# Patient Record
Sex: Male | Born: 1960 | Race: White | Hispanic: No | Marital: Married | Smoking: Former smoker
Health system: Southern US, Community
[De-identification: ages and names within clinical notes are randomized; demographics above are authoritative.]

## PROBLEM LIST (undated history)

## (undated) DIAGNOSIS — Z87442 Personal history of urinary calculi: Secondary | ICD-10-CM

## (undated) DIAGNOSIS — D649 Anemia, unspecified: Secondary | ICD-10-CM

## (undated) DIAGNOSIS — G709 Myoneural disorder, unspecified: Secondary | ICD-10-CM

## (undated) DIAGNOSIS — M1990 Unspecified osteoarthritis, unspecified site: Secondary | ICD-10-CM

## (undated) DIAGNOSIS — G629 Polyneuropathy, unspecified: Secondary | ICD-10-CM

## (undated) DIAGNOSIS — I10 Essential (primary) hypertension: Secondary | ICD-10-CM

## (undated) DIAGNOSIS — E119 Type 2 diabetes mellitus without complications: Secondary | ICD-10-CM

## (undated) DIAGNOSIS — E785 Hyperlipidemia, unspecified: Secondary | ICD-10-CM

## (undated) HISTORY — DX: Type 2 diabetes mellitus without complications: E11.9

## (undated) HISTORY — PX: TOE AMPUTATION: SHX809

## (undated) HISTORY — DX: Hyperlipidemia, unspecified: E78.5

## (undated) HISTORY — DX: Essential (primary) hypertension: I10

## (undated) HISTORY — PX: SHOULDER OPEN ROTATOR CUFF REPAIR: SHX2407

---

## 2009-02-12 LAB — HM COLONOSCOPY

## 2014-04-23 ENCOUNTER — Inpatient Hospital Stay: Payer: Self-pay | Admitting: Specialist

## 2014-04-28 ENCOUNTER — Ambulatory Visit: Payer: Self-pay | Admitting: Vascular Surgery

## 2014-06-12 ENCOUNTER — Ambulatory Visit (INDEPENDENT_AMBULATORY_CARE_PROVIDER_SITE_OTHER): Payer: Medicare Other | Admitting: Nurse Practitioner

## 2014-06-12 ENCOUNTER — Encounter (INDEPENDENT_AMBULATORY_CARE_PROVIDER_SITE_OTHER): Payer: Self-pay

## 2014-06-12 ENCOUNTER — Encounter: Payer: Self-pay | Admitting: Nurse Practitioner

## 2014-06-12 VITALS — BP 132/70 | HR 66 | Temp 98.8°F | Resp 16 | Ht 76.0 in | Wt 304.1 lb

## 2014-06-12 DIAGNOSIS — M5441 Lumbago with sciatica, right side: Secondary | ICD-10-CM

## 2014-06-12 DIAGNOSIS — E8881 Metabolic syndrome: Secondary | ICD-10-CM | POA: Insufficient documentation

## 2014-06-12 DIAGNOSIS — Z7189 Other specified counseling: Secondary | ICD-10-CM | POA: Diagnosis not present

## 2014-06-12 DIAGNOSIS — Z86718 Personal history of other venous thrombosis and embolism: Secondary | ICD-10-CM | POA: Insufficient documentation

## 2014-06-12 DIAGNOSIS — Z7689 Persons encountering health services in other specified circumstances: Secondary | ICD-10-CM

## 2014-06-12 DIAGNOSIS — M549 Dorsalgia, unspecified: Secondary | ICD-10-CM | POA: Insufficient documentation

## 2014-06-12 MED ORDER — CYCLOBENZAPRINE HCL 10 MG PO TABS: 10.0000 mg | ORAL_TABLET | Freq: Three times a day (TID) | ORAL | Status: DC | PRN

## 2014-06-12 NOTE — Assessment & Plan Note (Signed)
Discussed acute and chronic issues. Reviewed health maintenance measures, PFSHx, and immunizations.   

## 2014-06-12 NOTE — Progress Notes (Signed)
Subjective:    Patient ID: Joseph Singh, male    DOB: 1960-12-09, 54 y.o.   MRN: [id number]  HPI  Joseph Singh is a 54 yo male establishing care and CC of acute on chronic back pain.    1) New pt info:   Diet- 2 sodas a day, drinks water   Exercise- No formal   Immunizations- needs records   Pneumonia shot recently   Colonoscopy- Interested in cologuard   PSA-  Needs records   Eye Exam- Not UTD  Dental Exam- Not UTD   2) Metabolic Syndrome:  Diabetes- Medications, 3-4 years, 115-120 fasting BS in the hospital    Saw a Nutritionist twice, has complications, smoker,  polyneuropathy in feet and fingers, retinopathy in past  Hyperlipidemia- Not picked up prescription to start yet, diagnosed last  week (unsure if it was by hospital or prior clinic)  HTN- On medications (pt did not bring medications or list) Will call back  with meds.   3) Vascular issues- clots in legs- on plavix, left foot had toe amputations  due to gangrene from clots. Sees Dr. Wyn Quakerew for vascular/vein  help and [facility]ry [facility] ([facility]).   4) Back problems- chiropractors in past, 2 bulging discs, but does not  want surgery, 25 years of pain. Comes and goes    Intermittent pain, acute in the last week    Right lower back pain, radiates down lateral side of leg  and calf, numb at lower leg and painful from buttock to knee  Review of Systems  Constitutional: Negative for fever, chills, diaphoresis and fatigue.  HENT: Negative for tinnitus and trouble swallowing.   Eyes: Positive for visual disturbance.       Past retinopathy   Respiratory: Negative for chest tightness, shortness of breath and wheezing.   Cardiovascular: Negative for chest pain, palpitations and leg swelling.  Gastrointestinal: Negative for nausea, vomiting and diarrhea.  Endocrine: Positive for polydipsia. Negative for polyphagia and polyuria.  Genitourinary: Negative for difficulty urinating.  Musculoskeletal: Positive for back pain.    Skin: Negative for rash.  Neurological: Positive for numbness. Negative for dizziness and weakness.       Polyneuropathy  Psychiatric/Behavioral: The patient is not nervous/anxious.    Past Medical History  Diagnosis Date  . Diabetes mellitus without complication   . Hypertension   . Hyperlipidemia   . Kidney stones     History   Social History  . Marital Status: Married    Spouse Name: N/A  . Number of Children: N/A  . Years of Education: N/A   Occupational History  . Not on file.   Social History Main Topics  . Smoking status: Current Every Day Smoker -- 1.00 packs/day    Types: Cigarettes  . Smokeless tobacco: Never Used  . Alcohol Use: No  . Drug Use: No  . Sexual Activity:    Partners: Female     Comment: Wife   Other Topics Concern  . Not on file   Social History Narrative   Permanently disabled   Larey SeatFell on the job and hurt his shoulder    Lives with wife and 2 children    1 grandchild from a previous marriage    Pets: Not inside    GED    Right handed    Caffeine- 2-3 sodas, tea, no coffee    Enjoys watching tv and being outside     Past Surgical History  Procedure Laterality Date  . Toe amputation  Family History  Problem Relation Age of Onset  . Heart disease Mother   . Diabetes Father     No Known Allergies  No current outpatient prescriptions on file prior to visit.   No current facility-administered medications on file prior to visit.      Objective:   Physical Exam  Constitutional: He is oriented to person, place, and time. He appears well-developed and well-nourished. No distress.  BP 132/70 mmHg  Pulse 66  Temp(Src) 98.8 F (37.1 C) (Oral)  Resp 16  Ht  (1.93 m)  Wt 304 lb 1.9 oz (137.948 kg)  BMI 37.03 kg/m2  SpO2 98%  Poor hygiene  HENT:  Head: Normocephalic and atraumatic.  Right Ear: External ear normal.  Left Ear: External ear normal.  Eyes: Pupils are equal, round, and reactive to light. Right eye  exhibits no discharge. Left eye exhibits no discharge. No scleral icterus.  Cardiovascular: Normal rate and regular rhythm.  Exam reveals no gallop and no friction rub.   No murmur heard. Pulmonary/Chest: Effort normal and breath sounds normal. No respiratory distress. He has no wheezes. He has no rales. He exhibits no tenderness.  Musculoskeletal:  Soft open boot on left foot due to recent amputation of left toes  Neurological: He is alert and oriented to person, place, and time.  Skin: Skin is warm and dry. He is not diaphoretic.  Psychiatric: He has a normal mood and affect. His behavior is normal. Judgment and thought content normal.      Assessment & Plan:

## 2014-06-12 NOTE — Progress Notes (Signed)
Pre visit review using our clinic review tool, if applicable. No additional management support is needed unless otherwise documented below in the visit note. 

## 2014-06-12 NOTE — Assessment & Plan Note (Addendum)
Sees Dr. Wyn Quakerew for follow up soon. Encouraged eye exam. On plavix

## 2014-06-12 NOTE — Assessment & Plan Note (Signed)
Will try flexeril 10 mg as needed for back pain. Pt refuses surgical intervention. Refuses x-ray. Will discuss PT at next visit in 2 months.

## 2014-06-12 NOTE — Patient Instructions (Addendum)
Please let us know the names of your prescriptions.   We will follow up in 2 months for your back and diabetes.   Welcome to [facility]!

## 2014-06-12 NOTE — Assessment & Plan Note (Signed)
DM, HTN, and Hyperlipidemia.   DM Type II:  Metformin and glipizide XL  Recent A1c- 6.7 04/2013  Recent Creatinine- 1.10   Sees podiatry, no recent eye exam (encouraged this)  HTN: Metoprolol, HCTZ-lisinopril, and amlodipine   BMET- Glu 117, BUN 19, rest WNL   Hyperlipidemia- unsure of Dx and pt is picking up new medication soon. Will let us know.   FU in 2 months.

## 2014-06-16 LAB — SURGICAL PATHOLOGY

## 2014-06-22 NOTE — Consult Note (Signed)
Admit Diagnosis:   OSTEOMYELITIS LT FIFTH TOE: Onset Date: 23-Apr-2014, Status: Active, Description: OSTEOMYELITIS LT FIFTH TOE  Home Medications: Medication Instructions Status  gabapentin 300 mg oral capsule 1 cap(s) orally 4 times a day Active  traMADol 50 mg oral tablet 1 tab(s) orally 3 times a day, As Needed - for Pain Active  metFORMIN 1000 mg oral tablet 1 tab(s) orally 2 times a day Active  metoprolol succinate 50 mg oral tablet, extended release 1 tab(s) orally 2 times a day Active  hydrochlorothiazide-lisinopril 12.5 mg-20 mg oral tablet 2 tab(s) orally once a day Active  amLODIPine 10 mg oral tablet 1 tab(s) orally once a day Active  clindamycin 300 mg oral capsule 1 cap(s) orally every 8 hours Active  sulfamethoxazole-trimethoprim DS 1 tab(s) orally 2 times a day Active  GlipiZIDE XL 10 mg oral tablet, extended release 1 tab(s) orally 2 times a day Active   Lab Results:  Routine Hem:  02-Mar-16 17:45   WBC (CBC)  10.8  Hemoglobin (CBC) 14.1  Hematocrit (CBC) 41.2    No Known Allergies:   Nursing Flowsheets: **Vital Signs.:   03-Mar-16 07:20  Pulse Ox Activity Level  At rest    General Aspect Pt admitted with worsening gangrenous changes to left foot with infection.  Seen outpt by Dr. Alberteen Spindleline yesterday and found to have gangrenous changes to left 4th toe with cellulitis.  Pt noticed changes earlier in week and started on antibiotics at that time.   Case History and Physical Exam:  Cardiovascular Non-palpable pulses left foot.  CFT brisk to all toes except 4th toe.   Musculoskeletal Diffuse edema left lower leg.   Neurological Neuropathic b/l lower legs.   Skin Pt is s/p left 2nd nail avulsion with good healthy granular tissue base.  Noted gangrenous changes to lateral 1/2 of 4th toe with exposed capsule plantar lateral.  Mild cellulitis dorsal left foot.  No lymphangitis.    Impression Gangrene left 4th toe. Non-palpable pulses. Diabetic neuropathy.    Plan Pt will likely need amputation left 4th toe. Will d/w vascular surgery.  May need angio prior to amputation.  Addendum: Spoke to vascular surgery who will see today.  Will likely not be able to perform angio tomorrow but will evaluate for amputation tomrrow.  If he feels comfortable with amputation prior to angio will plan for amputation tomorrow.  Will place NPO tonight.   Electronic Signatures: Gwyneth RevelsFowler, Deetta Siegmann (MD)  (Signed 03-Mar-16 13:26)  Authored: Health Issues, Home Medications, Labs, Allergies, Vital Signs, General Aspect/Present Illness, History and Physical Exam, Impression/Plan   Last Updated: 03-Mar-16 13:26 by Gwyneth RevelsFowler, Mariselda Badalamenti (MD)

## 2014-06-22 NOTE — H&P (Signed)
PATIENT NAME:  Joseph BlanchDAVIS, Jonnatan R MR#:  [id number] DATE OF BIRTH:  1960/10/31  DATE OF ADMISSION:  04/23/2014  PRIMARY CARE PHYSICIAN: In [place].    REFERRING PHYSICIAN:  Dr. Linus Galasodd Cline, podiatry.    CHIEF COMPLAINT: Infection on left 4th toe.   HISTORY OF PRESENT ILLNESS: This is a 54 year old male who has history of hypertension, diabetes, hyperlipidemia, who is a chronic smoker for almost 40 years now, has neuropathy in his both feet. Says that he noticed there is some ulcer on his left side 4th toe 3 days ago. He went to his primary care doctor's office yesterday where the nurse practitioner did some dressing and started him on Bactrim and clindamycin oral and referred him to a podiatry doctor.  He wen to see Dr. Alberteen Spindleline, podiatry today, where Dr. Alberteen Spindleline found that he has poor circulation on that leg and so before he proceeds with anything he decided to send him in for direct admission for further workup. On my questioning he denies any pain, fevers, chills, or any similar infections in the past.   REVIEW OF SYSTEMS:    CONSTITUTIONAL: Negative for fever, fatigue, weakness, pain or weight loss.  EYES: No blurring of vision, discharge, or redness.  EARS, NOSE, THROAT: No tinnitus, ear pain, or hearing loss.  RESPIRATORY: No cough, wheezing, hemoptysis, or shortness of breath.  CARDIOVASCULAR: No chest pain, orthopnea, edema, arrhythmia, or palpitations.  GASTROINTESTINAL: No nausea, vomiting, diarrhea, abdominal pain.  GENITOURINARY: No dysuria, hematuria, increased frequency.  ENDOCRINE: No heat or cold intolerance. No excessive sweating.  SKIN: No acne, rashes, or lesions.  MUSCULOSKELETAL: No pain or swelling in the joints, but on his left 4th toe he has some ulcer. Legs, no edema.  NEUROLOGICAL: No numbness, weakness, tremor, or vertigo. There are no new symptoms, but his both legs and feet are having neuropathy and he does not have sensations on them.  PSYCHIATRIC: No anxiety, insomnia,  bipolar disorder.   PAST MEDICAL HISTORY:  1. Diabetes.  2. Hypertension.  3. Hyperlipidemia.  4. Chronic back pain.   PAST SURGICAL HISTORY: Shoulder surgery for rotator cuff injury.   SOCIAL HISTORY: He is a smoker, he was smoking 2-3 packs every day but now currently smoking 1 pack of cigarettes every day and trying to cut down. He denies drinking alcohol or illegal drug use. He is disabled and not working currently. Lives with family.   FAMILY HISTORY: Positive for brother who died because of diabetes and complications and then organ failure, required organ transplant at age of 56 and he died because of those complications. His mother had diabetes and complications and she lived up to 470s.   HOME MEDICATIONS:  1. Tramadol 50 mg oral tablet 3 times a day.  2. Sulfamethoxazole and trimethoprim 1 tablet 2 times a day.  3. Metoprolol succinate 50 mg 2 times a day.  4. Metformin 1000 mg oral 2 times a day.  5. Hydrochlorothiazide plus lisinopril 2 tablets once a day.  6. Glipizide XL 10 mg oral 2 times a day.   7. Gabapentin 300 mg 4 times a day.  8. Clindamycin 300 mg oral every 8 hours.  9. Amlodipine 10 mg once a day   PHYSICAL EXAMINATION:   VITAL SIGNS:  Temperature 98.3, pulse 86, respirations 18, blood pressure 164/84, pulse oximetry is 96 on room air GENERAL: The patient is fully alert and oriented to time, place, and person. Does not appear in acute distress.  HEENT: Head and neck atraumatic.  Conjunctivae pink. Sclerae anicteric. Oral mucosa moist.  NECK: Supple. Thyroid nontender. No JVD.  RESPIRATORY: Bilateral equal and clear air entry. No crepitation or wheezing.  CARDIOVASCULAR: S1 and S2 present, regular. No murmur.  ABDOMEN: Soft, nontender. Bowel sounds present. No organomegaly.  SKIN: The patient has blackening of his 4th toe on left side and slight swelling and redness on his distal foot on the same side. Otherwise there are no other rashes or ulcers on the skin.  Well hydrated.  LEGS: No edema.  JOINTS: No swelling or tenderness.  NEUROLOGIC: Power is 5/5 in all 4 limbs. Follows commands. No tremor or rigidity. Sensation is intact except both distal legs and feet, sensation loss because of neuropathy. Reflexes intact.  PSYCHIATRIC: Does not appear in acute psychiatric illness at this time.     LABORATORY REPORTS:  As the patient is sent for direct admission there are no laboratory results drawn yet. I ordered CBC, BMP, and x-ray of his foot, will check it later on.   ASSESSMENT AND PLAN: A 54 year old male who has a history of diabetes, hypertension, hyperlipidemia, and smoker, sent by podiatry doctor because of poor circulation found on top of the ulcer on the 4th toe.   1.  Toe infection. As he has infection on the left 4th toe and some blackening of the skin with poor circulation we will start him currently on vancomycin and Zosyn, get an x-ray of the foot to rule out osteomyelitis and vascular consult to decide the circulation issue.  Podiatry will act after that depending on requirement of surgery. We will also give Tylenol and Percocet as needed for pain management though he has neuropathy, not feeling much pain.  2.  Diabetes. We will continue home medications, glipizide and metformin and put him on insulin sliding scale coverage.  3.  Hyperlipidemia. Continue hydrochlorothiazide, lisinopril, and metoprolol as he is taking at home.  4.  Smoking. Smoking cessation counseling is done for 4 minutes and offered a nicotine patch, he does not want to use it now, but he will ask the nurse if he requires, agrees to quit smoking.  5.  Poor circulation in peripheral arteries, he is a chronic smoker and has medical issues which make him more likely to have peripheral vascular disease. Will call a vascular consult for further management of this issue.   TOTAL TIME SPENT ON THIS ADMISSION: 50 minutes.      ____________________________ Hope Pigeon Elisabeth Pigeon,  MD vgv:bu D: 04/23/2014 17:37:18 ET T: 04/23/2014 17:59:08 ET JOB#: [id number]  cc: Hope Pigeon. Elisabeth Pigeon, MD, <Dictator> Linus Galas, [facility] MD ELECTRONICALLY SIGNED 05/08/2014 11:45

## 2014-06-22 NOTE — Op Note (Signed)
PATIENT NAME:  Joseph Singh, Joseph Singh MR#:  [id number] DATE OF BIRTH:  02-Dec-1960  DATE OF PROCEDURE:  04/28/2014  PREOPERATIVE DIAGNOSES: 1.  Peripheral arterial disease with gangrene left lower extremity.  2.  Gangrene of left toe, status post amputation.  3.  Diabetes.  4.  Tobacco dependence.   POSTOPERATIVE DIAGNOSES:  1.  Peripheral arterial disease with gangrene left lower extremity.  2.  Gangrene of left toe, status post amputation.  3.  Diabetes.  4.  Tobacco dependence.   PROCEDURES: 1.  Ultrasound guidance for vascular access to right femoral artery.  2.  Catheter placement to left posterior tibial artery and left peroneal artery from right femoral approach.  3.  Aortogram and selective left lower extremity angiogram.  4.  Percutaneous transluminal angioplasty of left peroneal artery with 3 mm diameter angioplasty balloon.  5.  Percutaneous transluminal angioplasty of left posterior tibial artery with 3 mm diameter angioplasty balloon.  6.  Percutaneous transluminal angioplasty of proximal left posterior tibial artery and tibioperoneal trunk with 4 mm diameter angioplasty balloon.  7.  Percutaneous transluminal angioplasty of separate and distinct lesions in the distal superficial femoral artery, and the above-knee popliteal artery with a single 6 mm diameter x 15 cm in length Lutonix drug-coated angioplasty balloon.  8.  StarClose closure device, right femoral artery.   SURGEON: Annice Needy, MD   ANESTHESIA: Local with moderate conscious sedation.   BLOOD LOSS: 25 mL.   INDICATION FOR PROCEDURE: This is a 54 year old gentleman who we saw in consultation at the end of last week. He had gangrene in the left foot. He has since had a toe amputation and has been kept on IV antibiotics. Given his clinical history of long-standing diabetes and tobacco use and poorly palpable pedal pulses, he is brought in for angiography for further evaluation and potential treatment. Risks and benefits  were discussed. Informed consent was obtained.   DESCRIPTION OF PROCEDURE: The patient is brought to the vascular suite. Groin was shaved and prepped, and a sterile surgical field was created. The patient's right femoral artery was accessed under direct ultrasound guidance without difficulty with a Seldinger needle. A J-wire and 5 French sheath were placed. Ultrasound was used due to his large body habitus and poorly palpable femoral pulses and this was easily accessed and a permanent image was recorded. A pigtail catheter was placed in the aorta and AP aortogram was performed. This showed what appeared to be normal flow in the visceral vessels including the renals, SMA, and celiac. The aorta and iliac segments did not appear to have high-grade stenosis. I then crossed  the aortic bifurcation and advanced to the left femoral head. Selective left lower extremity angiogram was then performed. This demonstrated a lesion at Hunter's canal. There was a napkin ringlike lesion that appeared to be in the 80%-90% range. The vessel normalized over about a  6-8 cm span and then a separate distinct lesion in the above-knee popliteal artery. There was also a focal high-grade stenosis in the 80% range seen. The vessel then normalized down to a tibial trifurcation. The anterior tibial artery occluded shortly after its takeoff and did not reconstitute distally. The tibioperoneal trunk had high-grade stenosis in it, and then he had stenosis into the proximal portions of both the posterior tibial and the peroneal arteries. The patient was systemically heparinized. A 6 French Ansell sheath was placed over a [employer] wire. I initially got through the SFA and popliteal lesions with no difficulty  and confirmed intraluminal flow in the popliteal artery below the knee, opacifying the tibial disease and using this as a roadmap to cross. I initially crossed into the posterior tibial artery, which was the best runoff distally. The  tibioperoneal trunk lesion was treated with a 4 mm diameter Lutonix drug-coated angioplasty balloon that went down to the proximal posterior tibial artery. Furthermore, still the proximal and mid posterior tibial artery were more distal to the origin. I downsized to a 3 mm diameter angioplasty balloon in the posterior tibial artery with a good angiographic completion result. This still left some stenosis in the proximal peroneal artery. I pulled the wire back and used a 135 cm CXI catheter to help cannulate the peroneal artery and advanced beyond the stenosis in the peroneal artery. This was treated with a 3 mm diameter angioplasty balloon in the proximal peroneal artery as well, resulting in markedly improved flow and better runoff in the tibial vessels. The tibioperoneal trunk stenosis was less than 20%. The posterior tibial proximal stenosis was less than 20% and the proximal peroneal stenosis was less than 10%.   I then turned my attention to the SFA and popliteal lesions even though these were separate and distinct lesions, they were close enough together that I was able to treat them with a single  angioplasty inflation, with a 6 mm diameter x 15 cm length Lutonix drug-coated angioplasty balloon. Narrowing was seen in both locations which resolved with angioplasty. Completion angiogram following these inflations showed about a 10% residual stenosis in the SFA lesion and about a 15% residual stenosis in the popliteal lesion, neither of which had any flow limitation and no significant dissection was seen.   At this point, I elected to terminate the procedure. The sheath was pulled back to the ipsilateral external iliac artery and oblique arteriogram was performed. StarClose closure device was deployed in the usual fashion with excellent hemostatic result. The patient tolerated the procedure well and was taken to the recovery room in stable condition.   ____________________________ Annice NeedyJason S. Dew,  MD jsd:LT D: 04/28/2014 15:25:26 ET T: 04/28/2014 18:25:22 ET JOB#: [id number]  cc: Annice NeedyJason S. Dew, MD, <Dictator> Argentina DonovanJustin A. [name], DPM Annice NeedyJASON S DEW MD ELECTRONICALLY SIGNED 05/01/2014 10:40

## 2014-06-22 NOTE — Consult Note (Signed)
CHIEF COMPLAINT and HISTORY:  Subjective/Chief Complaint toe ulceration   History of Present Illness Patient admitted yesterday with gangrenous changes to his left fourth toe.  Has longstanding DM and tobacco abuse and was seen by Podiatry prior to admission.  Had second toe nail trimmed a few weeks ago.  has some pain in foot but not too bad.  Swelling is present.  Was not really having claudication or rest pain type symptoms prior to admission.  No right leg symptoms currently.  Ulcer came up spontaneously and has not changed much with local wound care or IV Abx.  No trauma or injury.  This started only about 3-4 days ago.   Past Medical Health Hypertension, Diabetes Mellitus, tobacco use   ALLERGIES:  Allergies:  No Known Allergies:   HOME MEDICATIONS:  Home Medications: Medication Instructions Status  GlipiZIDE XL 10 mg oral tablet, extended release 1 tab(s) orally 2 times a day Active  sulfamethoxazole-trimethoprim DS 1 tab(s) orally 2 times a day Active  clindamycin 300 mg oral capsule 1 cap(s) orally every 8 hours Active  amLODIPine 10 mg oral tablet 1 tab(s) orally once a day Active  hydrochlorothiazide-lisinopril 12.5 mg-20 mg oral tablet 2 tab(s) orally once a day Active  metoprolol succinate 50 mg oral tablet, extended release 1 tab(s) orally 2 times a day Active  metFORMIN 1000 mg oral tablet 1 tab(s) orally 2 times a day Active  traMADol 50 mg oral tablet 1 tab(s) orally 3 times a day, As Needed - for Pain Active  gabapentin 300 mg oral capsule 1 cap(s) orally 4 times a day Active   Family and Social History:  Family History Hypertension  Diabetes Mellitus  brother with renal failure   Social History positive  tobacco, negative ETOH, negative Illicit drugs   + Tobacco Current (within 1 year)  heavy, 1 PPD   Place of Living Home   Review of Systems:  Subjective/Chief Complaint No TIA/stroke/seizure No heat or cold intolerance No dysuria/hematuria No blurry or  double vision No tinnitus or ear pain Skin breakdown as above on left fourth toe No suicidal ideation or psychosis No signs of bleeding or easy bruising No SOB/DOE, orthopnea, or sputum No palpitations or chest pain No N/V/D or abdominal pain No joint pain or joint swelling No fever or chills No unintentional weight loss or gain   Physical Exam:  GEN well developed, well nourished, no acute distress, obese   HEENT pink conjunctivae, PERRL, hearing intact to voice   NECK No masses  trachea midline   RESP normal resp effort  no use of accessory muscles   CARD regular rate  no murmur  no JVD   VASCULAR ACCESS none   ABD denies tenderness  soft  normal BS   GU no superpubic tenderness   LYMPH negative neck, negative axillae   EXTR negative cyanosis/clubbing, left foot with moderate swelling.  Fourth toe is gangrenous with open ulceration.  Nail site on second toe is healing well.  Pedal pulses not easily palpable with swelling, but 1+ PT pulse on left.  Better pulses on right   SKIN No rashes, positive ulcers, skin turgor good   NEURO cranial nerves intact, follows commands, motor/sensory function intact   PSYCH alert, A+O to time, place, person, good insight   LABS:  Laboratory Results: Hepatic:    02-Mar-16 17:45, Comprehensive Metabolic Panel  Bilirubin, Total 0.4  Alkaline Phosphatase 85  SGPT (ALT) 12  SGOT (AST) 11  Total Protein, Serum 7.3  Albumin, Serum 3.2  Routine Chem:  Glucose, Serum 121  BUN 14  Creatinine (comp) 0.96  Sodium, Serum 134  Potassium, Serum 3.7  Chloride, Serum 98  CO2, Serum 33  Calcium (Total), Serum 9.0  Osmolality (calc) 270  eGFR (African American) >60  eGFR (Non-African American) >60  eGFR values <5mL/min/1.73 m2 may be an indication of chronic  kidney disease (CKD).  Calculated eGFR, using the MRDR Study equation, is useful in   patients with stable renal function.  The eGFR calculation will not be reliable in acutely  ill patients  when serum creatinine is changing rapidly. It is not useful in  patients on dialysis. The eGFR calculation may not be applicable  to patients at the low and high extremes of body sizes, pregnant  women, and vegetarians.  Anion Gap 3    03-Mar-16 04:07, Hemoglobin A1c ([facility])  Hemoglobin A1c ([facility]) 6.7  The American Diabetes Association recommends that a primary goal of  therapy should be <7% and that physicians should reevaluate the  treatment regimen in patients with HbA1c values consistently >8%.  Routine Hem:    02-Mar-16 17:45, CBC Profile  WBC (CBC) 10.8  RBC (CBC) 4.57  Hemoglobin (CBC) 14.1  Hematocrit (CBC) 41.2  Platelet Count (CBC) 285  MCV 90  MCH 30.8  MCHC 34.1  RDW 12.6  Neutrophil % 71.5  Lymphocyte % 19.2  Monocyte % 7.6  Eosinophil % 0.7  Basophil % 1.0  Neutrophil # 7.7  Lymphocyte # 2.1  Monocyte # 0.8  Eosinophil # 0.1  Basophil # 0.1  Result(s) reported on 23 Apr 2014 at 06:04PM.   RADIOLOGY:  Radiology Results: XRay:    02-Mar-16 17:57, Foot Left Complete  Foot Left Complete  REASON FOR EXAM:    Rule Out Osteomylitis  COMMENTS:       PROCEDURE: DXR - DXR FOOT LT COMP W/OBLIQUES  - Apr 23 2014  5:57PM     CLINICAL DATA:  Initial encounter for wound in the fourth toe in a  patient with diabetes.    EXAM:  LEFT FOOT - COMPLETE 3+ VIEW    COMPARISON:  None.    FINDINGS:  There is no evidence of fracture or dislocation. There is no  evidence of arthropathy or other focal bone abnormality. Soft  tissues are unremarkable.     IMPRESSION:  No acute bony abnormality. Specifically, no evidence for bony  erosion in the fourth toe to suggest osteomyelitis.      Electronically Signed    By: Misty Stanley M.D.    On: 04/23/2014 18:57         Verified By: ERIC A. MANSELL, M.D.,  LabUnknown:  PACS Image   ASSESSMENT AND PLAN:  Assessment/Admission Diagnosis left foot ulceration/gangrenous changes to left fourth toe. DM,   tobacco dependence, need for cessation discussed obesity HTN   Plan discussed case with Podiatry.  He will have his fourth toe amputated tomorrow.  Vascular status is clearly a concern and in question.  He has DM and heavy tobacco use.  Clinical exam supports PAD as well.  Discussed with patient and would recommend angiogram on Monday, 3/7.  risks and benefits are discussed and he is agreeable to proceed.  Serious nature of the problem discussed with patient and wife and risk of limb loss was stressed.     level 4 consult   Electronic Signatures: Algernon Huxley (MD)  (Signed 03-Mar-16 17:21)  Authored: Chief Complaint and History, ALLERGIES, HOME  MEDICATIONS, Family and Social History, Review of Systems, Physical Exam, LABS, RADIOLOGY, Assessment and Plan   Last Updated: 03-Mar-16 17:21 by Algernon Huxley (MD)

## 2014-06-22 NOTE — Op Note (Signed)
PATIENT NAME:  Paulette BlanchDAVIS, Shields R MR#:  [id number] DATE OF BIRTH:  1960-04-06  DATE OF PROCEDURE:  04/25/2014  PREOPERATIVE DIAGNOSIS: Left fourth toe gangrene.   POSTOPERATIVE DIAGNOSIS: Left fourth toe gangrene.   PROCEDURE: Amputation of left fourth toe metatarsophalangeal joint.   SURGEON: Kong Packett A. [name], DPM   ANESTHESIA: IV sedation with local.   HEMOSTASIS: None.   COMPLICATIONS: None.   SPECIMEN: Gangrenous left fourth toe.   ESTIMATED BLOOD LOSS: Minimal.   OPERATIVE INDICATIONS: This is a 54 year old gentleman who has developed a gangrenous left fourth toe from an ulceration. Admitted and felt to be stable for amputation of his left fourth toe. He is to have angiogram in approximately 3 days. The risks, benefits, alternatives, and complications associated with surgery were discussed with the patient and full informed consent has been given.   OPERATIVE PROCEDURE: The patient was brought into the OR, placed on the operating table in the supine position. IV sedation was administered by the anesthesia team. The left lower extremity was then prepped and draped in the usual sterile fashion. Attention was directed to the left fourth toe where 2 semielliptical incisions were made at the base of the fourth toe. Full thickness incision was taken down to the bone. The toe was disarticulated at the MTPJ. This wound was then flushed with copious amounts of irrigation. Mild bleeding of the skin was noted during the procedure. Closure was then performed with 4-0 Vicryl for the deeper layers and a 3-0 nylon for skin. A bulky compressive sterile dressing was placed to his left foot. He tolerated the procedure and anesthesia well and was transported from the OR to the PACU with all vital signs stable and neurovascular status intact.   I will see him in the outpatient clinic in 5 to 7 days. He will be readmitted to the floor and followed by medicine until his angiogram has been  performed.  ____________________________ Argentina DonovanJustin A. [name], DPM jaf:sb D: 04/25/2014 14:16:03 ET T: 04/25/2014 15:25:41 ET JOB#: [id number]  cc: Jill AlexandersJustin A. [name], DPM, <Dictator> Neal Oshea DPM ELECTRONICALLY SIGNED 05/19/2014 10:43

## 2014-06-22 NOTE — Discharge Summary (Signed)
PATIENT NAME:  Joseph Singh, Joseph Singh MR#:  [id number] DATE OF BIRTH:  1961-01-16  DATE OF ADMISSION:  04/23/2014 DATE OF DISCHARGE:  04/26/2014  DISCHARGE DIAGNOSES:  1. Left fourth toe gangrene.  2. Peripheral vascular disease.  3. Diabetes.  4. Hypertension.    CONDITION ON DISCHARGE: Stable.   MEDICATIONS ON DISCHARGE:  1. Gabapentin 300 mg 4 times a day.  2. Metoprolol 50 mg oral tablet extended-release 2 times a day.  3. Hydrochlorothiazide and lisinopril 12.5 + 20 mg 2 tablets once a day.  4. Amlodipine 10 mg oral tablet once a day.  5. Glipizide 10 mg oral tablet extended-release 2 times a day.  6. Acetaminophen and oxycodone tablet 5 mg 1-4 times a day as needed for moderate pain.  7. Amoxicillin and clavulanate, Augmentin 875 + 125 mg every 12 hours for 7 days.  8. Docusate 100 mg 2 times a day as needed for constipation.  9. Nicotine patch 14 mg once a day.   DIET ON DISCHARGE: Low-sodium, carbohydrate-controlled ADA diet, regular consistency.   FOLLOWUP:  Advised to follow within 1-2 weeks with Dr. Ether Griffins podiatry, and 1-2 weeks with Dr. Festus Barren in vascular. The patient is also scheduled to have an appointment with Dr. Wyn Quaker in vascular procedure to have an angiogram within the next 2-3 days. He is advised to follow up.   HISTORY OF PRESENTING ILLNESS: A 54 year old male with history of hypertension, diabetes, hyperlipidemia, and chronic smoker for 40 years, had neuropathy in his both feet, noticed that he had some ulcer on his left side of fourth toe for the last 3 days. His primary care doctor's office nurse practitioner did some dressing change and started him on Bactrim and clindamycin oral and referred him to podiatry doctor. He saw Dr. Alberteen Spindle, podiatry on the day of admission where Dr. Alberteen Spindle found him having poor circulation in the leg and so decided to send him for direct admission for further workup, the patient was admitted with that.   HOSPITAL COURSE AND STAY:  1.  Left  foot fourth toe gangrene. X-ray was negative for osteomyelitis. Amputation was done by Dr. Ether Griffins on podiatry and gave him instruction to follow up in the clinic. Also called vascular consult because of poor circulation, Dr. dew saw him and scheduled him to have an angiogram on Monday, but he agreed the patient does not need to be in the hospital for that, and so we discharged the patient with advice to come as an outpatient for the procedure and follow with both of his doctors in the clinic.  We discharged him on oral Augmentin and pain medication. 2.  Other medical issues: Diabetes. He was on glipizide and metformin, we held it initially and later on restarted glipizide, but hold metformin because he was planning to go for angiogram.  3.  Hypertension. Continued medication, remained stable.   4.  Neuropathy.  Continue gabapentin.   5.  Smoking.  Nicotine patch applied and counseled him for 4 minutes.   CONSULTS IN THE HOSPITAL: Dr. Gwyneth Revels for podiatry and Dr. Festus Barren for vascular.   IMPORTANT LABORATORY RESULTS:  1.  WBC 10.8, hemoglobin 14.1, platelet count 285,000, MCV is 90.  2.  Glucose 121, BUN 14, creatinine 0.96, sodium 134, potassium 3.7, chloride 98, and CO2 is 33.  3.  X-ray left foot shows no acute bony abnormality.  4.  On further followup his creatinine was 1.1 and potassium 4.3 on March 5. WBC count was 9.6,  hemoglobin 14.9.   TOTAL TIME SPENT ON THIS DISCHARGE: 40 minutes.     ____________________________ Joseph PigeonVaibhavkumar G. Elisabeth PigeonVachhani, MD vgv:bu D: 04/28/2014 08:31:28 ET T: 04/28/2014 13:08:14 ET JOB#: [id number]  cc: Joseph PigeonVaibhavkumar G. Elisabeth PigeonVachhani, MD, <Dictator> Joseph NeedyJason S. Dew, MD Joseph Singh, DPM Altamese DillingVAIBHAVKUMAR Ralphie Lovelady MD ELECTRONICALLY SIGNED 05/08/2014 11:45

## 2014-06-26 ENCOUNTER — Telehealth: Payer: Self-pay | Admitting: Nurse Practitioner

## 2014-06-26 NOTE — Telephone Encounter (Signed)
The patient was seen by Lyla Sonarrie and she was to look over his chart to see if he should take a steroid or muscle relaxer for lower back pain.

## 2014-06-27 ENCOUNTER — Telehealth: Payer: Self-pay

## 2014-06-27 NOTE — Telephone Encounter (Signed)
Wife was concerned that they haven't heard back on if the patient can take a steroid or muscle relaxer for his back.  He was seen back on 4/21 to establish care.  Patient called on 5/5 to inquire about this.  Can you advise him?  Thanks.  Per him and his wife they would prefer a phone call rather than a MyChart message.  Thanks

## 2014-07-02 ENCOUNTER — Other Ambulatory Visit: Payer: Self-pay | Admitting: *Deleted

## 2014-07-02 ENCOUNTER — Telehealth: Payer: Self-pay | Admitting: *Deleted

## 2014-07-02 MED ORDER — TRAMADOL HCL 50 MG PO TABS: 50.0000 mg | ORAL_TABLET | Freq: Two times a day (BID) | ORAL | Status: DC

## 2014-07-02 NOTE — Telephone Encounter (Signed)
Tramadol HCL 50 mg twice a day as needed for pain. #60, no refills.

## 2014-07-02 NOTE — Telephone Encounter (Signed)
Fax from pharmacy requesting Tramadol HCL.  Review of chart it appears this medication is a historical med.  Last OV 4.21.16.  Please advise refill

## 2014-07-28 ENCOUNTER — Other Ambulatory Visit: Payer: Self-pay | Admitting: Nurse Practitioner

## 2014-07-28 NOTE — Telephone Encounter (Signed)
Last OV 4.21.16, next OV 6.27.16. Ok to fill?

## 2014-08-18 ENCOUNTER — Encounter: Payer: Self-pay | Admitting: Nurse Practitioner

## 2014-08-18 ENCOUNTER — Ambulatory Visit (INDEPENDENT_AMBULATORY_CARE_PROVIDER_SITE_OTHER): Payer: Medicare Other | Admitting: Nurse Practitioner

## 2014-08-18 VITALS — BP 136/60 | HR 73 | Temp 98.3°F | Resp 18 | Ht 76.0 in | Wt 304.8 lb

## 2014-08-18 DIAGNOSIS — E1159 Type 2 diabetes mellitus with other circulatory complications: Secondary | ICD-10-CM | POA: Diagnosis not present

## 2014-08-18 DIAGNOSIS — IMO0002 Reserved for concepts with insufficient information to code with codable children: Secondary | ICD-10-CM

## 2014-08-18 DIAGNOSIS — E8881 Metabolic syndrome: Secondary | ICD-10-CM | POA: Diagnosis not present

## 2014-08-18 DIAGNOSIS — E119 Type 2 diabetes mellitus without complications: Secondary | ICD-10-CM | POA: Insufficient documentation

## 2014-08-18 DIAGNOSIS — E1151 Type 2 diabetes mellitus with diabetic peripheral angiopathy without gangrene: Secondary | ICD-10-CM | POA: Insufficient documentation

## 2014-08-18 DIAGNOSIS — E1165 Type 2 diabetes mellitus with hyperglycemia: Secondary | ICD-10-CM | POA: Insufficient documentation

## 2014-08-18 DIAGNOSIS — M5441 Lumbago with sciatica, right side: Secondary | ICD-10-CM

## 2014-08-18 MED ORDER — TRAMADOL HCL ER 100 MG PO TB24: 100.0000 mg | ORAL_TABLET | Freq: Every day | ORAL | Status: DC

## 2014-08-18 MED ORDER — CLOPIDOGREL BISULFATE 75 MG PO TABS: 75.0000 mg | ORAL_TABLET | Freq: Every day | ORAL | Status: DC

## 2014-08-18 NOTE — Patient Instructions (Signed)
Make sure to get your annual eye exam!   Work on quitting smoking- this will greatly help your legs. (Gum is over the counter and may be helpful).   Follow up in 1 month for back pain.

## 2014-08-18 NOTE — Progress Notes (Signed)
Pre visit review using our clinic review tool, if applicable. No additional management support is needed unless otherwise documented below in the visit note. 

## 2014-08-18 NOTE — Assessment & Plan Note (Signed)
UDS and CSC today. NCCSR was checked and last script was from 07/28/14, pt was taking differently than prescribed (3 x daily) Last dose this morning. Discussed addiction possibility/tolerance ect... He is aware of risks. Pt pain is multifactorial and we will work on diet/smoking cessation/ and other modalities.

## 2014-08-18 NOTE — Progress Notes (Signed)
   Subjective:    Patient ID: Delaine Lamehomas Ray Faulkner, male    DOB: 1960-08-25, 54 y.o.   MRN: [id number]  HPI  [name] is a 54 yo male with a follow up of back pain and diabetes.   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   A1c- (6.7) 04/2014 - in [facility] with other labs.   2) Back pain-   Tramadol helpful- 6 am, 12 noon, and 6 pm    Last dose- this morning at 5 am   Gabapentin- helpful    Same as last time, "comes and goes"    Weakness on right still  Review of Systems  Constitutional: Negative for fever, chills, diaphoresis, activity change and fatigue.  Eyes: Negative for visual disturbance.  Gastrointestinal: Negative for nausea, vomiting and diarrhea.  Genitourinary: Negative for dysuria and difficulty urinating.  Musculoskeletal: Positive for back pain and arthralgias. Negative for myalgias, joint swelling, gait problem, neck pain and neck stiffness.  Skin: Negative for rash.  Neurological: Negative for dizziness, weakness and numbness.      Objective:   Physical Exam  Constitutional: He is oriented to person, place, and time. He appears well-developed and well-nourished. No distress.  BP 136/60 mmHg  Pulse 73  Temp(Src) 98.3 F (36.8 C)  Resp 18  Ht 6\' 4"  (1.93 m)  Wt 304 lb 12.8 oz (138.256 kg)  BMI 37.12 kg/m2  SpO2 98%   HENT:  Head: Normocephalic and atraumatic.  Right Ear: External ear normal.  Left Ear: External ear normal.  Cardiovascular: Normal rate, regular rhythm, normal heart sounds and intact distal pulses.  Exam reveals no gallop and no friction rub.   No murmur heard. Pulmonary/Chest: Effort normal and breath sounds normal. No respiratory distress. He has no wheezes. He has no rales. He exhibits no tenderness.  Neurological: He is alert and oriented to  person, place, and time.  Iliopsoas 4/5 right, 5/5 left, Tib anterior 5/5 bilateral, EHL 5/5 bilateral, no ankle clonus, intact heel/toe/sequential walking, sensation intact upper and lower extremities. Straight leg raise negative bilaterally.   Skin: Skin is warm and dry. No rash noted. He is not diaphoretic.  Psychiatric: He has a normal mood and affect. His behavior is normal. Judgment and thought content normal.      Assessment & Plan:

## 2014-08-19 LAB — MICROALBUMIN / CREATININE URINE RATIO
Creatinine,U: 131.5 mg/dL
MICROALB UR: 1.2 mg/dL (ref 0.0–1.9)
Microalb Creat Ratio: 0.9 mg/g (ref 0.0–30.0)

## 2014-08-26 ENCOUNTER — Encounter: Payer: Self-pay | Admitting: Nurse Practitioner

## 2014-08-26 NOTE — Assessment & Plan Note (Signed)
Will obtain updated Urine microalbumin. Encouraged eye exam. Seeing podiatry in July. FU in 4 weeks

## 2014-09-15 ENCOUNTER — Other Ambulatory Visit: Payer: Self-pay | Admitting: Nurse Practitioner

## 2014-09-15 ENCOUNTER — Ambulatory Visit (INDEPENDENT_AMBULATORY_CARE_PROVIDER_SITE_OTHER): Payer: Medicare Other | Admitting: Nurse Practitioner

## 2014-09-15 VITALS — BP 140/78 | HR 86 | Temp 98.5°F | Resp 18 | Ht 76.0 in | Wt 306.4 lb

## 2014-09-15 DIAGNOSIS — E1159 Type 2 diabetes mellitus with other circulatory complications: Secondary | ICD-10-CM | POA: Diagnosis not present

## 2014-09-15 DIAGNOSIS — IMO0002 Reserved for concepts with insufficient information to code with codable children: Secondary | ICD-10-CM

## 2014-09-15 DIAGNOSIS — M5441 Lumbago with sciatica, right side: Secondary | ICD-10-CM

## 2014-09-15 DIAGNOSIS — E1165 Type 2 diabetes mellitus with hyperglycemia: Principal | ICD-10-CM

## 2014-09-15 DIAGNOSIS — E1151 Type 2 diabetes mellitus with diabetic peripheral angiopathy without gangrene: Secondary | ICD-10-CM

## 2014-09-15 MED ORDER — TRAMADOL HCL ER 100 MG PO TB24: 100.0000 mg | ORAL_TABLET | Freq: Every day | ORAL | Status: DC

## 2014-09-15 MED ORDER — TRAMADOL HCL (ER BIPHASIC) 100 MG PO CP24: 30.0000 mg | ORAL_CAPSULE | Freq: Every day | ORAL | Status: DC

## 2014-09-15 MED ORDER — TRAMADOL HCL 50 MG PO TABS: 100.0000 mg | ORAL_TABLET | Freq: Every day | ORAL | Status: DC

## 2014-09-15 MED ORDER — CYCLOBENZAPRINE HCL 10 MG PO TABS: 10.0000 mg | ORAL_TABLET | Freq: Three times a day (TID) | ORAL | Status: DC | PRN

## 2014-09-15 NOTE — Progress Notes (Signed)
Subjective:    Patient ID: Joseph Singh, male    DOB: 16-Oct-1960, 54 y.o.   MRN: [id number]  HPI  Mr. Coryell is a 54 yo male with a follow up of diabetes and back pain.   1) DM-   BS- Does not remember numbers, checking once a week.   Eye exam- Not scheduled due to financial and time constraints.   2) Back pain-   Slightly better due to the extended Tramadol.   3) DMV- placard request- cane (uses when walking consistently)     Scooter- rented one at [employer], helpful for pt. He has one at home and needs a new battery. He will be moving his daughter into college this August.   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   Review of Systems  Constitutional: Negative for fever, chills, diaphoresis and fatigue.  Eyes: Negative for visual disturbance.  Respiratory: Negative for chest tightness, shortness of breath and wheezing.   Cardiovascular: Negative for chest pain, palpitations and leg swelling.  Gastrointestinal: Negative for nausea, vomiting and diarrhea.  Endocrine: Negative for polydipsia, polyphagia and polyuria.  Musculoskeletal: Positive for back pain.  Skin: Negative for rash.  Neurological: Negative for dizziness, weakness and numbness.  Psychiatric/Behavioral: The patient is not nervous/anxious.    Past Medical History  Diagnosis Date  . Diabetes mellitus without complication   . Hypertension   . Hyperlipidemia   . Kidney stones     History   Social History  . Marital Status: Married    Spouse Name: N/A  . Number of Children: N/A  . Years of Education: N/A   Occupational History  . Not on file.   Social History Main Topics  . Smoking status: Current Every Day Smoker -- 1.00 packs/day    Types: Cigarettes  . Smokeless tobacco: Never Used  . Alcohol Use: No  . Drug Use: No  . Sexual Activity:    Partners: Female     Comment: Wife   Other Topics Concern  . Not on file   Social History Narrative   Permanently  disabled   [employer] on the job and hurt his shoulder    Lives with wife and 2 children    1 grandchild from a previous marriage    Pets: Not inside    GED    Right handed    Caffeine- 2-3 sodas, tea, no coffee    Enjoys watching tv and being outside     Past Surgical History  Procedure Laterality Date  . Toe amputation      Family History  Problem Relation Age of Onset  . Heart disease Mother   . Diabetes Father     No Known Allergies  Current Outpatient Prescriptions on File Prior to Visit  Medication Sig Dispense Refill  . amLODipine (NORVASC) 10 MG tablet Take 10 mg by mouth daily.    . clopidogrel (PLAVIX) 75 MG tablet Take 1 tablet (75 mg total) by mouth daily. 90 tablet 0  . gabapentin (NEURONTIN) 300 MG capsule Take 300 mg by mouth 4 (four) times daily.    Marland Kitchen glipiZIDE (GLUCOTROL XL) 10 MG 24 hr tablet Take 10 mg by mouth 2 (two) times daily.    Marland Kitchen lisinopril-hydrochlorothiazide (PRINZIDE,ZESTORETIC) 20-12.5 MG per tablet Take 1 tablet by mouth 2 (two) times daily.    . metFORMIN (GLUCOPHAGE) 1000 MG tablet Take 1,000 mg by mouth 2 (two) times daily with a meal.     No  current facility-administered medications on file prior to visit.      Objective:   Physical Exam  Constitutional: He is oriented to person, place, and time. He appears well-developed and well-nourished. No distress.  BP 140/78 mmHg  Pulse 86  Temp(Src) 98.5 F (36.9 C)  Resp 18  Ht 6\' 4"  (1.93 m)  Wt 306 lb 6.4 oz (138.982 kg)  BMI 37.31 kg/m2  SpO2 97%   HENT:  Head: Normocephalic and atraumatic.  Right Ear: External ear normal.  Left Ear: External ear normal.  Eyes: EOM are normal. Pupils are equal, round, and reactive to light. Right eye exhibits no discharge. Left eye exhibits no discharge. No scleral icterus.  Cardiovascular: Normal rate, regular rhythm, normal heart sounds and intact distal pulses.  Exam reveals no gallop and no friction rub.   No murmur heard. Pulmonary/Chest: Effort  normal and breath sounds normal. No respiratory distress. He has no wheezes. He has no rales. He exhibits no tenderness.  Musculoskeletal: He exhibits tenderness. He exhibits no edema.  Decreased ROM of bilateral hips and back   Neurological: He is alert and oriented to person, place, and time.  Skin: Skin is warm and dry. No rash noted. He is not diaphoretic.  Psychiatric: He has a normal mood and affect. His behavior is normal. Judgment and thought content normal.      Assessment & Plan:

## 2014-09-15 NOTE — Patient Instructions (Addendum)
Please have your eye exam by the next visit.   Bring your blood sugar numbers at the next visit. We need to get blood work at the next visit (your A1c will be due).   Please make a visit for evaluation for scooter.

## 2014-09-15 NOTE — Progress Notes (Signed)
Pre visit review using our clinic review tool, if applicable. No additional management support is needed unless otherwise documented below in the visit note. 

## 2014-09-16 ENCOUNTER — Telehealth: Payer: Self-pay

## 2014-09-16 NOTE — Telephone Encounter (Signed)
Started PA for tramadolol process online.  Awaiting results.

## 2014-09-28 ENCOUNTER — Encounter: Payer: Self-pay | Admitting: Nurse Practitioner

## 2014-09-28 NOTE — Assessment & Plan Note (Addendum)
Back pain is stable. His pain is better managed using the Tramadol 100 mg ER. His DMV handicap placard request was filled out and signed by Dr. Darrick Huntsman due to pain of back and his toe amputations he is unable to walk far without assistance. 3 months of Tramadol scripts given to pt with fill on or after dates. Will fu in Sept.   He is compliant w/ contract via [employer]. UDS has been obtained.

## 2014-09-28 NOTE — Assessment & Plan Note (Signed)
Pt still has not received an eye exam and reports it may not happen due to financial struggles at the moment. I encouraged him to get one at his earliest convenience. Pt did not bring a log and reports he only checks his BS 1 x weekly. He wanted an evaluation for a scooter, but reports he has one and needs a battery, which he is to work on obtaining. FU in Sept for A1c re-test and asked him to bring his readings.   No results found for: HGBA1C Lab Results  Component Value Date   MICROALBUR 1.2 08/19/2014

## 2014-10-20 ENCOUNTER — Telehealth: Payer: Self-pay

## 2014-10-20 ENCOUNTER — Other Ambulatory Visit: Payer: Self-pay

## 2014-10-20 MED ORDER — TRAMADOL HCL 50 MG PO TABS: 50.0000 mg | ORAL_TABLET | Freq: Two times a day (BID) | ORAL | Status: DC

## 2014-10-20 NOTE — Telephone Encounter (Signed)
Resubmitted with new prescription.

## 2014-10-20 NOTE — Telephone Encounter (Signed)
Prescription requiring PA sent for Tramadol.  On 7/25 we changed his Rx as the PA was denied.  Do you want to change to  or would you like the PA to be tried again?  Please advise.

## 2014-10-20 NOTE — Telephone Encounter (Signed)
Okay to go back to Tramadol 50 mg twice daily # 60 no refills

## 2014-11-01 ENCOUNTER — Other Ambulatory Visit: Payer: Self-pay | Admitting: Nurse Practitioner

## 2014-11-17 ENCOUNTER — Ambulatory Visit (INDEPENDENT_AMBULATORY_CARE_PROVIDER_SITE_OTHER): Payer: Medicare Other | Admitting: Nurse Practitioner

## 2014-11-17 ENCOUNTER — Encounter: Payer: Self-pay | Admitting: Nurse Practitioner

## 2014-11-17 VITALS — BP 130/72 | HR 77 | Temp 98.7°F | Ht 76.0 in | Wt 305.1 lb

## 2014-11-17 DIAGNOSIS — IMO0002 Reserved for concepts with insufficient information to code with codable children: Secondary | ICD-10-CM

## 2014-11-17 DIAGNOSIS — E1165 Type 2 diabetes mellitus with hyperglycemia: Principal | ICD-10-CM

## 2014-11-17 DIAGNOSIS — M5441 Lumbago with sciatica, right side: Secondary | ICD-10-CM | POA: Diagnosis not present

## 2014-11-17 DIAGNOSIS — E1159 Type 2 diabetes mellitus with other circulatory complications: Secondary | ICD-10-CM

## 2014-11-17 DIAGNOSIS — Z86718 Personal history of other venous thrombosis and embolism: Secondary | ICD-10-CM

## 2014-11-17 DIAGNOSIS — E1151 Type 2 diabetes mellitus with diabetic peripheral angiopathy without gangrene: Secondary | ICD-10-CM

## 2014-11-17 MED ORDER — CYCLOBENZAPRINE HCL 10 MG PO TABS
10.0000 mg | ORAL_TABLET | Freq: Three times a day (TID) | ORAL | Status: DC | PRN
Start: 2014-11-17 — End: 2015-10-30

## 2014-11-17 MED ORDER — TRAMADOL HCL 50 MG PO TABS: 50.0000 mg | ORAL_TABLET | Freq: Two times a day (BID) | ORAL | Status: DC

## 2014-11-17 NOTE — Progress Notes (Signed)
Pre visit review using our clinic review tool, if applicable. No additional management support is needed unless otherwise documented below in the visit note. 

## 2014-11-17 NOTE — Patient Instructions (Signed)
See you in 3 months.

## 2014-11-17 NOTE — Progress Notes (Signed)
Patient ID: Joseph Singh, male    DOB: 1960/08/06  Age: 54 y.o. MRN: [id number]  CC: Follow-up   HPI Joseph Singh presents for 2 month follow up for back pain, PVD, and DM type II.   1) Just over a pack a day still smoking. He wants to try e-cigarettes. Smokes more outside, does not smoke in house or car.   2) Flexeril- needs refill, helpful for back spasms.   3) Blockage on right vein in leg- vein specialist to schedule for procedure.   4) Pt eating a healthier diet he reports. He is eating more salads, Baked vs. Fried foods, Water, 2-5 sodas daily (he reports this is his crutch).  No increase in exercise.    History Joseph Singh has a past medical history of Diabetes mellitus without complication; Hypertension; Hyperlipidemia; and Kidney stones.   He has past surgical history that includes Toe amputation.   His family history includes Diabetes in his father; Heart disease in his mother.He reports that he has been smoking Cigarettes.  He has been smoking about 1.00 pack per day. He has never used smokeless tobacco. He reports that he does not drink alcohol or use illicit drugs.  Outpatient Prescriptions Prior to Visit  Medication Sig Dispense Refill  . amLODipine (NORVASC) 10 MG tablet Take 10 mg by mouth daily.    . clopidogrel (PLAVIX) 75 MG tablet TAKE ONE (1) TABLET BY MOUTH EVERY DAY 90 tablet 4  . gabapentin (NEURONTIN) 300 MG capsule Take 300 mg by mouth 4 (four) times daily.    Marland Kitchen glipiZIDE (GLUCOTROL XL) 10 MG 24 hr tablet Take 10 mg by mouth 2 (two) times daily.    Marland Kitchen lisinopril-hydrochlorothiazide (PRINZIDE,ZESTORETIC) 20-12.5 MG per tablet Take 1 tablet by mouth 2 (two) times daily.    . metFORMIN (GLUCOPHAGE) 1000 MG tablet Take 1,000 mg by mouth 2 (two) times daily with a meal.    . cyclobenzaprine (FLEXERIL) 10 MG tablet Take 1 tablet (10 mg total) by mouth 3 (three) times daily as needed for muscle spasms. 30 tablet 1  . traMADol (ULTRAM) 50 MG tablet Take 1 tablet  (50 mg total) by mouth 2 (two) times daily. 60 tablet 0  . TraMADol HCl 100 MG CP24 Take 30 mg by mouth daily. 30 capsule 0   No facility-administered medications prior to visit.    ROS Review of Systems  Constitutional: Negative for fever, chills, diaphoresis and fatigue.  Eyes: Negative for visual disturbance.  Respiratory: Negative for chest tightness, shortness of breath and wheezing.   Cardiovascular: Negative for chest pain, palpitations and leg swelling.  Gastrointestinal: Negative for nausea, vomiting and diarrhea.  Endocrine: Negative for polydipsia, polyphagia and polyuria.  Musculoskeletal: Positive for back pain.  Skin: Negative for rash.  Neurological: Negative for dizziness, weakness and numbness.  Psychiatric/Behavioral: The patient is not nervous/anxious.    Objective:  BP 130/72 mmHg  Pulse 77  Temp(Src) 98.7 F (37.1 C) (Oral)  Ht  (1.93 m)  Wt 305 lb 1.9 oz (138.402 kg)  BMI 37.16 kg/m2  SpO2 95%  Physical Exam  Constitutional: He is oriented to person, place, and time. He appears well-developed and well-nourished. No distress.  HENT:  Head: Normocephalic and atraumatic.  Right Ear: External ear normal.  Left Ear: External ear normal.  Cardiovascular: Normal rate, regular rhythm and normal heart sounds.   Pulmonary/Chest: Effort normal and breath sounds normal. No respiratory distress. He has no wheezes. He has no rales. He exhibits no  tenderness.  Musculoskeletal: He exhibits tenderness. He exhibits no edema.  Neurological: He is alert and oriented to person, place, and time. Coordination abnormal.  Sees podiatry for foot care Limping gait  Skin: Skin is warm and dry. No rash noted. He is not diaphoretic.  Psychiatric: He has a normal mood and affect. His behavior is normal. Judgment and thought content normal.   Assessment & Plan:   Joseph Singh was seen today for follow-up.  Diagnoses and all orders for this visit:  DM (diabetes mellitus) type II  uncontrolled, periph vascular disorder  Right-sided low back pain with right-sided sciatica  H/O blood clots  Other orders -     traMADol (ULTRAM) 50 MG tablet; Take 1 tablet (50 mg total) by mouth 2 (two) times daily. -     cyclobenzaprine (FLEXERIL) 10 MG tablet; Take 1 tablet (10 mg total) by mouth 3 (three) times daily as needed for muscle spasms.  I have discontinued Joseph Singh TraMADol HCl. I am also having him maintain his gabapentin, metFORMIN, glipiZIDE, amLODipine, lisinopril-hydrochlorothiazide, clopidogrel, traMADol, and cyclobenzaprine.  Meds ordered this encounter  Medications  . traMADol (ULTRAM) 50 MG tablet    Sig: Take 1 tablet (50 mg total) by mouth 2 (two) times daily.    Dispense:  90 tablet    Refill:  2    Order Specific Question:  Supervising Provider    Answer:  Duncan Dull L [[id number]]  . cyclobenzaprine (FLEXERIL) 10 MG tablet    Sig: Take 1 tablet (10 mg total) by mouth 3 (three) times daily as needed for muscle spasms.    Dispense:  30 tablet    Refill:  2    Order Specific Question:  Supervising Provider    Answer:  Sherlene Shams [[id number]]     Follow-up: Return if symptoms worsen or fail to improve.

## 2014-11-18 NOTE — Assessment & Plan Note (Signed)
Pt is UTD on his labs and will continue regimen. Sees podiatry for foot care. Lost toes to blood clot.

## 2014-11-18 NOTE — Assessment & Plan Note (Signed)
Seeing Dr. Wyn Quaker for right leg blockage that he plans on having a procedure for. Will follow

## 2014-11-18 NOTE — Assessment & Plan Note (Signed)
Back pain is uncontrolled on two tramadol daily. Impacting his activity. He would like to see how he does on 3 a day as needed. Filled for 3 months. Will follow up in 3 months. CSC and UDS UTD. [place] checked for compliance.

## 2014-12-15 ENCOUNTER — Other Ambulatory Visit: Payer: Self-pay

## 2014-12-15 MED ORDER — GABAPENTIN 300 MG PO CAPS: 300.0000 mg | ORAL_CAPSULE | Freq: Four times a day (QID) | ORAL | Status: DC

## 2014-12-17 ENCOUNTER — Other Ambulatory Visit: Payer: Self-pay | Admitting: Nurse Practitioner

## 2014-12-17 ENCOUNTER — Telehealth: Payer: Self-pay

## 2014-12-17 MED ORDER — TRAMADOL HCL 50 MG PO TABS: 50.0000 mg | ORAL_TABLET | Freq: Three times a day (TID) | ORAL | Status: DC

## 2014-12-17 MED ORDER — GABAPENTIN 300 MG PO CAPS: 300.0000 mg | ORAL_CAPSULE | Freq: Four times a day (QID) | ORAL | Status: DC

## 2014-12-17 NOTE — Telephone Encounter (Signed)
-----   Message from Carollee Leitzarrie M Doss, NP sent at 12/17/2014 10:56 AM EDT ----- Please let pt know the tramadol was changed to 3 x a day and sent to the pharmacy and 3 months of gabapentin.

## 2014-12-17 NOTE — Telephone Encounter (Signed)
Informed his wife the changes to the Rx's were made

## 2015-03-10 ENCOUNTER — Other Ambulatory Visit: Payer: Self-pay | Admitting: Nurse Practitioner

## 2015-03-10 NOTE — Telephone Encounter (Signed)
Patient was seen in September and medication refilled in October with 2 refills. Please advise?

## 2015-04-06 ENCOUNTER — Other Ambulatory Visit: Payer: Self-pay | Admitting: Nurse Practitioner

## 2015-04-07 NOTE — Telephone Encounter (Signed)
Last OV 11/17/14, Last filled on 03/11/15, #90 with 0 refills... Okay to refill?

## 2015-06-02 ENCOUNTER — Other Ambulatory Visit: Payer: Self-pay

## 2015-06-02 MED ORDER — METFORMIN HCL 1000 MG PO TABS: 1000.0000 mg | ORAL_TABLET | Freq: Two times a day (BID) | ORAL | Status: DC

## 2015-06-10 ENCOUNTER — Other Ambulatory Visit: Payer: Self-pay | Admitting: Nurse Practitioner

## 2015-09-17 DIAGNOSIS — E1140 Type 2 diabetes mellitus with diabetic neuropathy, unspecified: Secondary | ICD-10-CM | POA: Diagnosis not present

## 2015-09-17 DIAGNOSIS — B351 Tinea unguium: Secondary | ICD-10-CM | POA: Diagnosis not present

## 2015-09-28 ENCOUNTER — Other Ambulatory Visit: Payer: Self-pay | Admitting: Nurse Practitioner

## 2015-09-28 NOTE — Telephone Encounter (Signed)
Refill sent in. Last A1c greater than a year ago was 6.7. Patient needs to be seen in the office sooner than October.

## 2015-09-28 NOTE — Telephone Encounter (Signed)
Refilled 06/02/15. Patient has not been seen since 11/17/14. There are no recent A1C's in the chart. She has a future appointment with you In October.

## 2015-10-09 ENCOUNTER — Other Ambulatory Visit: Payer: Self-pay | Admitting: Nurse Practitioner

## 2015-10-09 NOTE — Telephone Encounter (Signed)
Sent to pharmacy 

## 2015-10-09 NOTE — Telephone Encounter (Signed)
Medication refilled 12/17/2014. Patient has a scheduled appointment with Dr.Sonneberg on 12/11/15. Pt last seen 11/17/14 by Naomie Deanarrie Doss, NP. Please advise?

## 2015-10-12 ENCOUNTER — Other Ambulatory Visit: Payer: Self-pay | Admitting: Surgical

## 2015-10-12 MED ORDER — TRAMADOL HCL 50 MG PO TABS: ORAL_TABLET | ORAL | 0 refills | Status: DC

## 2015-10-30 ENCOUNTER — Ambulatory Visit (INDEPENDENT_AMBULATORY_CARE_PROVIDER_SITE_OTHER): Payer: Medicare Other | Admitting: Family Medicine

## 2015-10-30 VITALS — BP 130/72 | HR 87 | Temp 98.3°F | Wt 314.6 lb

## 2015-10-30 DIAGNOSIS — E1142 Type 2 diabetes mellitus with diabetic polyneuropathy: Secondary | ICD-10-CM

## 2015-10-30 DIAGNOSIS — M5441 Lumbago with sciatica, right side: Secondary | ICD-10-CM

## 2015-10-30 DIAGNOSIS — E1151 Type 2 diabetes mellitus with diabetic peripheral angiopathy without gangrene: Secondary | ICD-10-CM | POA: Diagnosis not present

## 2015-10-30 DIAGNOSIS — IMO0002 Reserved for concepts with insufficient information to code with codable children: Secondary | ICD-10-CM

## 2015-10-30 DIAGNOSIS — Z1159 Encounter for screening for other viral diseases: Secondary | ICD-10-CM | POA: Diagnosis not present

## 2015-10-30 DIAGNOSIS — Z114 Encounter for screening for human immunodeficiency virus [HIV]: Secondary | ICD-10-CM

## 2015-10-30 DIAGNOSIS — I10 Essential (primary) hypertension: Secondary | ICD-10-CM | POA: Diagnosis not present

## 2015-10-30 DIAGNOSIS — I739 Peripheral vascular disease, unspecified: Secondary | ICD-10-CM

## 2015-10-30 DIAGNOSIS — E1165 Type 2 diabetes mellitus with hyperglycemia: Secondary | ICD-10-CM

## 2015-10-30 MED ORDER — TRAMADOL HCL 50 MG PO TABS: ORAL_TABLET | ORAL | 0 refills | Status: DC

## 2015-10-30 MED ORDER — METFORMIN HCL 1000 MG PO TABS: ORAL_TABLET | ORAL | 3 refills | Status: DC

## 2015-10-30 MED ORDER — GLIPIZIDE ER 10 MG PO TB24: 10.0000 mg | ORAL_TABLET | Freq: Every day | ORAL | 3 refills | Status: DC

## 2015-10-30 MED ORDER — CLOPIDOGREL BISULFATE 75 MG PO TABS: ORAL_TABLET | ORAL | 3 refills | Status: DC

## 2015-10-30 MED ORDER — LISINOPRIL-HYDROCHLOROTHIAZIDE 20-12.5 MG PO TABS: 1.0000 | ORAL_TABLET | Freq: Every day | ORAL | 3 refills | Status: DC

## 2015-10-30 MED ORDER — AMLODIPINE BESYLATE 10 MG PO TABS: 10.0000 mg | ORAL_TABLET | Freq: Every day | ORAL | 3 refills | Status: DC

## 2015-10-30 MED ORDER — CYCLOBENZAPRINE HCL 10 MG PO TABS: 10.0000 mg | ORAL_TABLET | Freq: Three times a day (TID) | ORAL | 2 refills | Status: DC | PRN

## 2015-10-30 MED ORDER — GABAPENTIN 300 MG PO CAPS: ORAL_CAPSULE | ORAL | 2 refills | Status: DC

## 2015-10-30 NOTE — Patient Instructions (Signed)
Nice to meet you. We will refill your medicines today.  Please contact Dr. Wyn Quakerew for follow-up on your peripheral vascular disease. Please check your blood sugars more frequently. If you develop numbness, weakness, loss of bowel or bladder function, numbness between her legs, fevers, pain in her legs, or any new or changing symptoms please seek medical attention.

## 2015-10-30 NOTE — Progress Notes (Signed)
Pre visit review using our clinic review tool, if applicable. No additional management support is needed unless otherwise documented below in the visit note. 

## 2015-10-31 ENCOUNTER — Encounter: Payer: Self-pay | Admitting: Family Medicine

## 2015-10-31 DIAGNOSIS — E1140 Type 2 diabetes mellitus with diabetic neuropathy, unspecified: Secondary | ICD-10-CM | POA: Insufficient documentation

## 2015-10-31 DIAGNOSIS — I10 Essential (primary) hypertension: Secondary | ICD-10-CM | POA: Insufficient documentation

## 2015-10-31 DIAGNOSIS — I739 Peripheral vascular disease, unspecified: Secondary | ICD-10-CM | POA: Insufficient documentation

## 2015-10-31 NOTE — Assessment & Plan Note (Signed)
Patient with peripheral vascular disease followed by vascular surgery. Diminished pulses bilaterally. I encouraged him to contact his vascular surgeon for follow-up in the next several weeks.

## 2015-10-31 NOTE — Progress Notes (Signed)
[name] Rumps, MD Phone: [phone]  Joseph Singh is a 55 y.o. male who presents today for follow-up.  HYPERTENSION  Disease Monitoring  Home BP Monitoring [id number] systolically Chest pain- no    Dyspnea- no Medications  Compliance-  taking amlodipine, lisinopril, HCTZ.  Edema- no  DIABETES Disease Monitoring: Blood Sugar ranges-notes he rarely checks though reports they're in a good range Polyuria/phagia/dipsia- no      ophthalmology- has not seen in the last 3 years Medications: Compliance- taking glipizide and metformin Hypoglycemic symptoms- rare, will eat something and feels better  Patient has neuropathy related to his diabetes as well. He gets burning and tingling and has numbness in his feet. Is on gabapentin and this does help. He follows with podiatry for this. Also takes tramadol for this.  Patient also has PAD. Followed by vascular surgery. Reports he had stenting in his left leg previously. He notes no claudication. He does take Plavix. He needs to follow-up with them in the next several weeks.  Low back pain: Patient notes he takes Flexeril occasionally. Notes occasionally his legs go numb if he stands in the same place for too long. No weakness. Has had an MRI in the past. No saddle anesthesia, bowel or bladder dysfunction, or fevers. No pain at this time.  PMH: Smoker   ROS see history of present illness  Objective  Physical Exam Vitals:   10/30/15 1451  BP: 130/72  Pulse: 87  Temp: 98.3 F (36.8 C)    BP Readings from Last 3 Encounters:  10/30/15 130/72  11/17/14 130/72  09/15/14 140/78   Wt Readings from Last 3 Encounters:  10/30/15 (!) 314 lb 9.6 oz (142.7 kg)  11/17/14 (!) 305 lb 1.9 oz (138.4 kg)  09/15/14 (!) 306 lb 6.4 oz (139 kg)    Physical Exam  Constitutional: No distress.  Cardiovascular: Normal rate, regular rhythm and normal heart sounds.   Pulmonary/Chest: Effort normal and breath sounds normal.  Musculoskeletal: He  exhibits no edema.  Neurological: He is alert.  5 out of 5 strength bilateral quads, hamstrings, plantar flexion, and dorsiflexion, decreased sensation to light touch in bilateral feet, otherwise intact sensation to light touch bilateral lower extremities, 2+ patellar reflexes  Skin: Skin is warm and dry. He is not diaphoretic.   Diabetic Foot Exam - Simple   Simple Foot Form Diabetic Foot exam was performed with the following findings:  Yes 10/30/2015  3:22 PM  Visual Inspection See comments:  Yes Sensation Testing See comments:  Yes Pulse Check See comments:  Yes Comments Patient with no ulcerations or skin breakdown though does have thickened toenails bilaterally, absent left fourth toe, decreased sensation to light touch bilaterally, absent sensation on monofilament testing bilaterally, diminished pulses at posterior tibialis and dorsalis pedis bilaterally      Assessment/Plan: Please see individual problem list.  DM (diabetes mellitus) type II uncontrolled, periph vascular disorder Patient has not been followed in about a year for his diabetes. He is not fasting today. He needs fasting lipids. We will check an A1c with this when he returns for fasting lab work. Continue current medications.  Essential hypertension At goal today. Continue current medications. We will check a CMP with his fasting lab work.  Back pain Stable. Neurologically at baseline today. No red flags. He will continue his current pain regimen. If he continues to have issues with numbness we will need to consider repeat MRI. He is given return precautions.  Peripheral vascular disease [facility]) Patient with  peripheral vascular disease followed by vascular surgery. Diminished pulses bilaterally. I encouraged him to contact his vascular surgeon for follow-up in the next several weeks.  Diabetic neuropathy [name] Stable. Continue gabapentin and tramadol. He'll continue to follow with podiatry.   Orders Placed This  Encounter  Procedures  . Comp Met (CMET)    Standing Status:   Future    Standing Expiration Date:   10/29/2016  . Lipid Profile    Standing Status:   Future    Standing Expiration Date:   10/29/2016  . HIV antibody (with reflex)    Standing Status:   Future    Standing Expiration Date:   10/29/2016  . Hepatitis C Antibody    Standing Status:   Future    Standing Expiration Date:   10/29/2016  . HgB A1c    Standing Status:   Future    Standing Expiration Date:   10/29/2016    Meds ordered this encounter  Medications  . amLODipine (NORVASC) 10 MG tablet    Sig: Take 1 tablet (10 mg total) by mouth daily.    Dispense:  90 tablet    Refill:  3  . clopidogrel (PLAVIX) 75 MG tablet    Sig: TAKE ONE (1) TABLET BY MOUTH EVERY DAY    Dispense:  90 tablet    Refill:  3  . cyclobenzaprine (FLEXERIL) 10 MG tablet    Sig: Take 1 tablet (10 mg total) by mouth 3 (three) times daily as needed for muscle spasms.    Dispense:  30 tablet    Refill:  2  . gabapentin (NEURONTIN) 300 MG capsule    Sig: TAKE ONE (1) CAPSULE FOUR (4) TIMES EACH DAY    Dispense:  360 capsule    Refill:  2  . glipiZIDE (GLUCOTROL XL) 10 MG 24 hr tablet    Sig: Take 1 tablet (10 mg total) by mouth daily with breakfast.    Dispense:  90 tablet    Refill:  3  . lisinopril-hydrochlorothiazide (PRINZIDE,ZESTORETIC) 20-12.5 MG tablet    Sig: Take 1 tablet by mouth daily.    Dispense:  90 tablet    Refill:  3  . metFORMIN (GLUCOPHAGE) 1000 MG tablet    Sig: TAKE 1 TABLET BY MOUTH TWICE DAILY WITH A MEAL    Dispense:  180 tablet    Refill:  3    Will need to schedule a earlier appointment with PCP before getting anymore refills  . traMADol (ULTRAM) 50 MG tablet    Sig: TAKE ONE (1) TABLET BY MOUTH 3 TIMES DAILY    Dispense:  180 tablet    Refill:  0     [name] Rumps, MD [place]

## 2015-10-31 NOTE — Assessment & Plan Note (Signed)
Stable. Neurologically at baseline today. No red flags. He will continue his current pain regimen. If he continues to have issues with numbness we will need to consider repeat MRI. He is given return precautions.

## 2015-10-31 NOTE — Assessment & Plan Note (Signed)
Stable. Continue gabapentin and tramadol. He'll continue to follow with podiatry.

## 2015-10-31 NOTE — Assessment & Plan Note (Signed)
Patient has not been followed in about a year for his diabetes. He is not fasting today. He needs fasting lipids. We will check an A1c with this when he returns for fasting lab work. Continue current medications.

## 2015-10-31 NOTE — Assessment & Plan Note (Signed)
At goal today. Continue current medications. We will check a CMP with his fasting lab work.

## 2015-11-20 ENCOUNTER — Other Ambulatory Visit (INDEPENDENT_AMBULATORY_CARE_PROVIDER_SITE_OTHER): Payer: Medicare Other

## 2015-11-20 DIAGNOSIS — Z1159 Encounter for screening for other viral diseases: Secondary | ICD-10-CM

## 2015-11-20 DIAGNOSIS — Z114 Encounter for screening for human immunodeficiency virus [HIV]: Secondary | ICD-10-CM

## 2015-11-20 DIAGNOSIS — E1151 Type 2 diabetes mellitus with diabetic peripheral angiopathy without gangrene: Secondary | ICD-10-CM

## 2015-11-20 DIAGNOSIS — I10 Essential (primary) hypertension: Secondary | ICD-10-CM | POA: Diagnosis not present

## 2015-11-20 LAB — COMPREHENSIVE METABOLIC PANEL
ALK PHOS: 67 U/L (ref 39–117)
ALT: 16 U/L (ref 0–53)
AST: 14 U/L (ref 0–37)
Albumin: 3.7 g/dL (ref 3.5–5.2)
BILIRUBIN TOTAL: 0.5 mg/dL (ref 0.2–1.2)
BUN: 11 mg/dL (ref 6–23)
CALCIUM: 9.2 mg/dL (ref 8.4–10.5)
CO2: 31 mEq/L (ref 19–32)
Chloride: 101 mEq/L (ref 96–112)
Creatinine, Ser: 0.83 mg/dL (ref 0.40–1.50)
GFR: 101.58 mL/min (ref >60.00)
GLUCOSE: 160 mg/dL — AB (ref 70–99)
POTASSIUM: 4.4 meq/L (ref 3.5–5.1)
Sodium: 138 mEq/L (ref 135–145)
TOTAL PROTEIN: 6.9 g/dL (ref 6.0–8.3)

## 2015-11-20 LAB — LIPID PANEL
CHOLESTEROL: 153 mg/dL (ref 0–200)
HDL: 27.7 mg/dL — AB (ref >39.00)
NONHDL: 125.57
TRIGLYCERIDES: 279 mg/dL — AB (ref 0.0–149.0)
Total CHOL/HDL Ratio: 6
VLDL: 55.8 mg/dL — ABNORMAL HIGH (ref 0.0–40.0)

## 2015-11-20 LAB — HEMOGLOBIN A1C: Hgb A1c MFr Bld: 7.5 % — ABNORMAL HIGH (ref 4.6–6.5)

## 2015-11-20 LAB — LDL CHOLESTEROL, DIRECT: LDL DIRECT: 91 mg/dL

## 2015-11-21 LAB — HEPATITIS C ANTIBODY: HCV Ab: NEGATIVE

## 2015-11-21 LAB — HIV ANTIBODY (ROUTINE TESTING W REFLEX): HIV: NONREACTIVE

## 2015-11-24 ENCOUNTER — Other Ambulatory Visit: Payer: Self-pay | Admitting: Family Medicine

## 2015-11-24 MED ORDER — EMPAGLIFLOZIN 10 MG PO TABS: 10.0000 mg | ORAL_TABLET | Freq: Every day | ORAL | 3 refills | Status: DC

## 2015-11-24 MED ORDER — ATORVASTATIN CALCIUM 40 MG PO TABS: 40.0000 mg | ORAL_TABLET | Freq: Every day | ORAL | 3 refills | Status: DC

## 2015-12-11 ENCOUNTER — Ambulatory Visit: Payer: Medicare Other | Admitting: Family Medicine

## 2015-12-25 ENCOUNTER — Other Ambulatory Visit: Payer: Self-pay | Admitting: Family Medicine

## 2016-01-05 ENCOUNTER — Other Ambulatory Visit: Payer: Self-pay | Admitting: Family Medicine

## 2016-01-05 ENCOUNTER — Telehealth: Payer: Self-pay | Admitting: *Deleted

## 2016-01-05 MED ORDER — TRAMADOL HCL 50 MG PO TABS: ORAL_TABLET | ORAL | 0 refills | Status: DC

## 2016-01-05 NOTE — Telephone Encounter (Signed)
Patient requested a medication  refill for tramadol  Pharmacy medi cap

## 2016-01-05 NOTE — Telephone Encounter (Signed)
Refill given

## 2016-01-05 NOTE — Telephone Encounter (Signed)
Is it ok to refill this?  

## 2016-01-05 NOTE — Telephone Encounter (Signed)
Refilled 10/30/15. Pt last seen 10/30/15. Please advise? 

## 2016-01-06 ENCOUNTER — Telehealth: Payer: Self-pay | Admitting: Family Medicine

## 2016-01-06 NOTE — Telephone Encounter (Signed)
RX faxed to pharmacy.

## 2016-02-01 ENCOUNTER — Ambulatory Visit (INDEPENDENT_AMBULATORY_CARE_PROVIDER_SITE_OTHER): Payer: Medicare Other | Admitting: Family Medicine

## 2016-02-01 ENCOUNTER — Ambulatory Visit: Payer: Medicare Other | Admitting: Family Medicine

## 2016-02-01 ENCOUNTER — Encounter: Payer: Self-pay | Admitting: Family Medicine

## 2016-02-01 VITALS — BP 140/80 | HR 88 | Temp 97.9°F | Wt 303.6 lb

## 2016-02-01 DIAGNOSIS — E1165 Type 2 diabetes mellitus with hyperglycemia: Secondary | ICD-10-CM | POA: Diagnosis not present

## 2016-02-01 DIAGNOSIS — E1151 Type 2 diabetes mellitus with diabetic peripheral angiopathy without gangrene: Secondary | ICD-10-CM | POA: Diagnosis not present

## 2016-02-01 DIAGNOSIS — I739 Peripheral vascular disease, unspecified: Secondary | ICD-10-CM | POA: Diagnosis not present

## 2016-02-01 DIAGNOSIS — G8929 Other chronic pain: Secondary | ICD-10-CM

## 2016-02-01 DIAGNOSIS — M5441 Lumbago with sciatica, right side: Secondary | ICD-10-CM

## 2016-02-01 DIAGNOSIS — E785 Hyperlipidemia, unspecified: Secondary | ICD-10-CM

## 2016-02-01 DIAGNOSIS — IMO0002 Reserved for concepts with insufficient information to code with codable children: Secondary | ICD-10-CM

## 2016-02-01 DIAGNOSIS — I10 Essential (primary) hypertension: Secondary | ICD-10-CM

## 2016-02-01 LAB — COMPREHENSIVE METABOLIC PANEL
ALT: 22 U/L (ref 0–53)
AST: 15 U/L (ref 0–37)
Albumin: 4 g/dL (ref 3.5–5.2)
Alkaline Phosphatase: 72 U/L (ref 39–117)
BUN: 14 mg/dL (ref 6–23)
CALCIUM: 9.5 mg/dL (ref 8.4–10.5)
CHLORIDE: 99 meq/L (ref 96–112)
CO2: 27 meq/L (ref 19–32)
Creatinine, Ser: 0.84 mg/dL (ref 0.40–1.50)
GFR: 100.12 mL/min (ref >60.00)
Glucose, Bld: 189 mg/dL — ABNORMAL HIGH (ref 70–99)
Potassium: 3.8 mEq/L (ref 3.5–5.1)
Sodium: 136 mEq/L (ref 135–145)
Total Bilirubin: 0.7 mg/dL (ref 0.2–1.2)
Total Protein: 7 g/dL (ref 6.0–8.3)

## 2016-02-01 LAB — LDL CHOLESTEROL, DIRECT: Direct LDL: 63 mg/dL

## 2016-02-01 MED ORDER — TRAMADOL HCL 50 MG PO TABS: ORAL_TABLET | ORAL | 0 refills | Status: DC

## 2016-02-01 MED ORDER — LISINOPRIL-HYDROCHLOROTHIAZIDE 20-25 MG PO TABS: 1.0000 | ORAL_TABLET | Freq: Every day | ORAL | 3 refills | Status: DC

## 2016-02-01 NOTE — Progress Notes (Signed)
Pre visit review using our clinic review tool, if applicable. No additional management support is needed unless otherwise documented below in the visit note. 

## 2016-02-01 NOTE — Patient Instructions (Signed)
Nice to see you. We are going to increase the hydrochlorothiazide portion of your lisinopril tablet. You should continue your amlodipine. You have refills at your pharmacy already. We will get you in to see your vascular surgeon. If you develop numbness, weakness, loss of bowel or bladder function, worsening pain in her legs, persistent pain in her legs, your foot becomes cool to touch or changes colors, or you develop any new or changing symptoms please seek medical attention immediately.

## 2016-02-02 DIAGNOSIS — E785 Hyperlipidemia, unspecified: Secondary | ICD-10-CM | POA: Insufficient documentation

## 2016-02-02 NOTE — Assessment & Plan Note (Signed)
Not yet due for A1c. Continue current medications. Recheck A1c next month when he follows up.

## 2016-02-02 NOTE — Assessment & Plan Note (Signed)
Continue Lipitor. Check LDL and CMP.

## 2016-02-02 NOTE — Assessment & Plan Note (Signed)
Blood pressure above goal. We're going to increase his hydrochlorothiazide. Continue lisinopril and amlodipine. Follow-up in one month.

## 2016-02-02 NOTE — Assessment & Plan Note (Signed)
Stable. Discussed further imaging though deferred at this time. Refill tramadol. Given return precautions.

## 2016-02-02 NOTE — Progress Notes (Signed)
[name] Rumps, MD Phone: [phone]  Joseph Singh is a 55 y.o. male who presents today for follow-up.  DIABETES Disease Monitoring: Blood Sugar ranges-not checking Polyuria/phagia/dipsia- no    Medications: Compliance- taking metformin, Jardiance, and glipizide Hypoglycemic symptoms- rarely occurs, describes it as a tired feeling, then eat something and feels better  HYPERLIPIDEMIA Symptoms Chest pain on exertion:  No   Leg claudication:   No Medications: Compliance- taking Lipitor Right upper quadrant pain- no  Muscle aches- no  Hypertension: Patient reports typically blood pressures in the 150s. No chest pain or shortness of breath. No edema. Taking his blood pressure medications as prescribed.  Peripheral vascular disease: Patient takes Plavix. He's had vascular surgery previously. Did not follow-up with them as advised previously. No claudication symptoms.  Back pain: Patient notes this is stable. Hurts mostly if he stands for a long period of time. Does have some radiation down his legs. Usually his left leg will go numb if he stands in one place for too long. Back pain can be 7 out of 10 at most. No other numbness, no weakness, no bowel or bladder function changes, no saddle anesthesia, and no fevers. Taking tramadol 3 times a day. Flexeril not that often.   PMH: Smoker   ROS see history of present illness  Objective  Physical Exam Vitals:   02/01/16 0904  BP: 140/80  Pulse: 88  Temp: 97.9 F (36.6 C)    BP Readings from Last 3 Encounters:  02/01/16 140/80  10/30/15 130/72  11/17/14 130/72   Wt Readings from Last 3 Encounters:  02/01/16 (!) 303 lb 9.6 oz (137.7 kg)  10/30/15 (!) 314 lb 9.6 oz (142.7 kg)  11/17/14 (!) 305 lb 1.9 oz (138.4 kg)    Physical Exam  Constitutional: No distress.  Cardiovascular: Normal rate, regular rhythm and normal heart sounds.   Pulmonary/Chest: Effort normal and breath sounds normal.  Musculoskeletal:  No midline  spine tenderness, no midline spine step-off, no muscular back tenderness, left fourth toe missing, bilateral toes with thickened toenails, decreased pedal pulses in the DP and PT areas bilaterally, bilateral feet are warm with good capillary refill  Neurological: He is alert. Gait normal.  5 out of 5 strength bilateral quads, hamstrings, plantar flexion, and dorsiflexion, sensation light touch slightly decreased in bilateral feet though otherwise intact in bilateral lower extremities, this is at the patient's baseline  Skin: He is not diaphoretic.     Assessment/Plan: Please see individual problem list.  Peripheral vascular disease ([place]) Patient with decreased pulses bilaterally in his feet. Did not follow-up with vascular surgery. We will refer back to vascular surgery today. Given return precautions.  Essential hypertension Blood pressure above goal. We're going to increase his hydrochlorothiazide. Continue lisinopril and amlodipine. Follow-up in one month.  DM (diabetes mellitus) type II uncontrolled, periph vascular disorder Not yet due for A1c. Continue current medications. Recheck A1c next month when he follows up.  Back pain Stable. Discussed further imaging though deferred at this time. Refill tramadol. Given return precautions.  Hyperlipidemia Continue Lipitor. Check LDL and CMP.   Orders Placed This Encounter  Procedures  . Comp Met (CMET)  . Direct LDL  . Ambulatory referral to Vascular Surgery    Referral Priority:   Routine    Referral Type:   Surgical    Referral Reason:   Specialty Services Required    Requested Specialty:   Vascular Surgery    Number of Visits Requested:   1  Meds ordered this encounter  Medications  . traMADol (ULTRAM) 50 MG tablet    Sig: TAKE ONE (1) TABLET BY MOUTH 3 TIMES DAILY    Dispense:  180 tablet    Refill:  0  . lisinopril-hydrochlorothiazide (PRINZIDE,ZESTORETIC) 20-25 MG tablet    Sig: Take 1 tablet by mouth daily.     Dispense:  90 tablet    Refill:  [place], MD [place]

## 2016-02-02 NOTE — Assessment & Plan Note (Signed)
Patient with decreased pulses bilaterally in his feet. Did not follow-up with vascular surgery. We will refer back to vascular surgery today. Given return precautions.

## 2016-02-05 ENCOUNTER — Encounter (INDEPENDENT_AMBULATORY_CARE_PROVIDER_SITE_OTHER): Payer: Self-pay | Admitting: Vascular Surgery

## 2016-02-05 ENCOUNTER — Ambulatory Visit (INDEPENDENT_AMBULATORY_CARE_PROVIDER_SITE_OTHER): Payer: Medicare Other | Admitting: Vascular Surgery

## 2016-02-05 VITALS — BP 126/72 | HR 74 | Resp 16 | Ht 76.0 in | Wt 301.0 lb

## 2016-02-05 DIAGNOSIS — F17210 Nicotine dependence, cigarettes, uncomplicated: Secondary | ICD-10-CM | POA: Diagnosis not present

## 2016-02-05 DIAGNOSIS — E1151 Type 2 diabetes mellitus with diabetic peripheral angiopathy without gangrene: Secondary | ICD-10-CM

## 2016-02-05 DIAGNOSIS — IMO0002 Reserved for concepts with insufficient information to code with codable children: Secondary | ICD-10-CM

## 2016-02-05 DIAGNOSIS — I70211 Atherosclerosis of native arteries of extremities with intermittent claudication, right leg: Secondary | ICD-10-CM

## 2016-02-05 DIAGNOSIS — I10 Essential (primary) hypertension: Secondary | ICD-10-CM

## 2016-02-05 DIAGNOSIS — Z87891 Personal history of nicotine dependence: Secondary | ICD-10-CM | POA: Insufficient documentation

## 2016-02-05 DIAGNOSIS — F17200 Nicotine dependence, unspecified, uncomplicated: Secondary | ICD-10-CM | POA: Insufficient documentation

## 2016-02-05 DIAGNOSIS — E1165 Type 2 diabetes mellitus with hyperglycemia: Secondary | ICD-10-CM

## 2016-02-05 DIAGNOSIS — I70219 Atherosclerosis of native arteries of extremities with intermittent claudication, unspecified extremity: Secondary | ICD-10-CM | POA: Insufficient documentation

## 2016-02-05 DIAGNOSIS — E785 Hyperlipidemia, unspecified: Secondary | ICD-10-CM

## 2016-02-05 NOTE — Assessment & Plan Note (Signed)
Recommend:  The patient has experienced increased symptoms and is now describing lifestyle limiting claudication and mild rest pain.   Given the severity of the patient's lower extremity symptoms the patient should undergo angiography and intervention.  Risk and benefits were reviewed the patient.  Indications for the procedure were reviewed.  All questions were answered, the patient agrees to proceed.   The patient should continue walking and begin a more formal exercise program.  The patient should continue antiplatelet therapy and aggressive treatment of the lipid abnormalities  The patient will follow up with me after the angiogram.  

## 2016-02-05 NOTE — Assessment & Plan Note (Signed)
blood pressure control important in reducing the progression of atherosclerotic disease. On appropriate oral medications.  

## 2016-02-05 NOTE — Progress Notes (Signed)
MRN : [id number]  Joseph Singh is a 55 y.o. (Aug 26, 1960) male who presents with chief complaint of  Chief Complaint  Patient presents with  . Re-evaluation    Follow up  .  History of Present Illness: Patient returns today in follow up Of worsening claudication symptoms of the right lower extremity. Almost 2 years ago, he underwent left lower extremity revascularization for limb salvage secondary to a gangrenous toe. That healed and he no longer has claudication on that side either. He continues to smoke. He has multiple atherosclerotic risk factors including diabetes, hypertension, and hyperlipidemia. His right lower extremity claudication symptoms have gotten quite disabling and he can now only walk short distances without having to stop and rest. He does not have ulceration or infection. He denies any pain at night forces him to dangle his leg, but he does have pain at rest from time to time. The left leg is feeling fine.  Current Outpatient Prescriptions  Medication Sig Dispense Refill  . amLODipine (NORVASC) 10 MG tablet Take 1 tablet (10 mg total) by mouth daily. 90 tablet 3  . atorvastatin (LIPITOR) 40 MG tablet Take 1 tablet (40 mg total) by mouth daily. 90 tablet 3  . clopidogrel (PLAVIX) 75 MG tablet TAKE ONE (1) TABLET BY MOUTH EVERY DAY 90 tablet 3  . cyclobenzaprine (FLEXERIL) 10 MG tablet Take 1 tablet (10 mg total) by mouth 3 (three) times daily as needed for muscle spasms. 30 tablet 2  . gabapentin (NEURONTIN) 300 MG capsule TAKE ONE (1) CAPSULE FOUR (4) TIMES EACH DAY 360 capsule 2  . glipiZIDE (GLUCOTROL XL) 10 MG 24 hr tablet Take 1 tablet (10 mg total) by mouth daily with breakfast. 90 tablet 3  . JARDIANCE 10 MG TABS tablet TAKE ONE (1) TABLET BY MOUTH EVERY DAY 90 mg 1  . lisinopril-hydrochlorothiazide (PRINZIDE,ZESTORETIC) 20-25 MG tablet Take 1 tablet by mouth daily. 90 tablet 3  . metFORMIN (GLUCOPHAGE) 1000 MG tablet TAKE 1 TABLET BY MOUTH TWICE DAILY WITH A  MEAL 180 tablet 3  . traMADol (ULTRAM) 50 MG tablet TAKE ONE (1) TABLET BY MOUTH 3 TIMES DAILY 180 tablet 0   No current facility-administered medications for this visit.     Past Medical History:  Diagnosis Date  . Diabetes mellitus without complication ([place])   . Hyperlipidemia   . Hypertension   . Kidney stones     Past Surgical History:  Procedure Laterality Date  . TOE AMPUTATION      Social History Social History  Substance Use Topics  . Smoking status: Current Every Day Smoker    Packs/day: 1.00    Types: Cigarettes  . Smokeless tobacco: Never Used  . Alcohol use No   No IV drug use  Family History Family History  Problem Relation Age of Onset  . Heart disease Mother   . Diabetes Father   No bleeding or clotting disorders  No Known Allergies   REVIEW OF SYSTEMS (Negative unless checked)  Constitutional: []Weight loss  []Fever  []Chills Cardiac: []Chest pain   []Chest pressure   []Palpitations   []Shortness of breath when laying flat   []Shortness of breath at rest   [x]Shortness of breath with exertion. Vascular:  [x]Pain in legs with walking   []Pain in legs at rest   []Pain in legs when laying flat   [x]Claudication   []Pain in feet when walking  []Pain in feet at rest  []Pain in feet when laying flat   []  History of DVT   []Phlebitis   []Swelling in legs   []Varicose veins   []Non-healing ulcers Pulmonary:   []Uses home oxygen   []Productive cough   []Hemoptysis   []Wheeze  []COPD   []Asthma Neurologic:  []Dizziness  []Blackouts   []Seizures   []History of stroke   []History of TIA  []Aphasia   []Temporary blindness   []Dysphagia   []Weakness or numbness in arms   []Weakness or numbness in legs Musculoskeletal:  []Arthritis   []Joint swelling   []Joint pain   []Low back pain Hematologic:  []Easy bruising  []Easy bleeding   []Hypercoagulable state   []Anemic   Gastrointestinal:  []Blood in stool   []Vomiting blood  []Gastroesophageal reflux/heartburn    []Abdominal pain Genitourinary:  []Chronic kidney disease   []Difficult urination  []Frequent urination  []Burning with urination   []Hematuria Skin:  []Rashes   []Ulcers   []Wounds Psychological:  []History of anxiety   [] History of major depression.  Physical Examination  BP 126/72 (BP Location: Right Arm)   Pulse 74   Resp 16   Ht 6' 4" (1.93 m)   Wt (!) 301 lb (136.5 kg)   BMI 36.64 kg/m  Gen:  WD/WN, NAD Head: [place]/AT, No temporalis wasting. Ear/Nose/Throat: Hearing grossly intact, nares w/o erythema or drainage, trachea midline Eyes: Conjunctiva clear. Sclera non-icteric Neck: Supple.  No JVD.  Pulmonary:  Good air movement, no use of accessory muscles.  Cardiac: RRR, normal S1, S2 Vascular:  Vessel Right Left  Radial Palpable Palpable  Ulnar Palpable Palpable  Brachial Palpable Palpable  Carotid Palpable, without bruit Palpable, without bruit  Aorta Not palpable N/A  Femoral Palpable Palpable  Popliteal Not Palpable Palpable  PT 1+ Palpable Palpable  DP Not Palpable 1+ Palpable   Gastrointestinal: soft, non-tender/non-distended. No guarding/reflex.  Musculoskeletal: M/S 5/5 throughout.  No deformity or atrophy.  Neurologic: Sensation grossly intact in extremities.  Symmetrical.  Speech is fluent.  Psychiatric: Judgment intact, Mood & affect appropriate for pt's clinical situation. Dermatologic: No rashes or ulcers noted.  No cellulitis or open wounds. Lymph : No Cervical, Axillary, or Inguinal lymphadenopathy.      Labs Recent Results (from the past 2160 hour(s))  Comp Met (CMET)     Status: Abnormal   Collection Time: 11/20/15  7:57 AM  Result Value Ref Range   Sodium 138 135 - 145 mEq/L   Potassium 4.4 3.5 - 5.1 mEq/L   Chloride 101 96 - 112 mEq/L   CO2 31 19 - 32 mEq/L   Glucose, Bld 160 (H) 70 - 99 mg/dL   BUN 11 6 - 23 mg/dL   Creatinine, Ser 0.83 0.40 - 1.50 mg/dL   Total Bilirubin 0.5 0.2 - 1.2 mg/dL   Alkaline Phosphatase 67 39 - 117 U/L   AST  14 0 - 37 U/L   ALT 16 0 - 53 U/L   Total Protein 6.9 6.0 - 8.3 g/dL   Albumin 3.7 3.5 - 5.2 g/dL   Calcium 9.2 8.4 - 10.5 mg/dL   GFR 101.58 >60.00 mL/min  Lipid Profile     Status: Abnormal   Collection Time: 11/20/15  7:57 AM  Result Value Ref Range   Cholesterol 153 0 - 200 mg/dL    Comment: ATP III Classification       Desirable:  < 200 mg/dL               Borderline High:  200 - 239 mg/dL  High:  > = 240 mg/dL   Triglycerides 279.0 (H) 0.0 - 149.0 mg/dL    Comment: Normal:  <150 mg/dLBorderline High:  150 - 199 mg/dL   HDL 27.70 (L) >39.00 mg/dL   VLDL 55.8 (H) 0.0 - 40.0 mg/dL   Total CHOL/HDL Ratio 6     Comment:                Men          Women1/2 Average Risk     3.4          3.3Average Risk          5.0          4.42X Average Risk          9.6          7.13X Average Risk          15.0          11.0                       NonHDL 125.57     Comment: NOTE:  Non-HDL goal should be 30 mg/dL higher than patient's LDL goal (i.e. LDL goal of < 70 mg/dL, would have non-HDL goal of < 100 mg/dL)  HIV antibody (with reflex)     Status: None   Collection Time: 11/20/15  7:57 AM  Result Value Ref Range   HIV 1&2 Ab, 4th Generation NONREACTIVE NONREACTIVE    Comment:   HIV-1 antigen and HIV-1/HIV-2 antibodies were not detected.  There is no laboratory evidence of HIV infection.   HIV-1/2 Antibody Diff        Not indicated. HIV-1 RNA, Qual TMA          Not indicated.     PLEASE NOTE: This information has been disclosed to you from records whose confidentiality may be protected by state law. If your state requires such protection, then the state law prohibits you from making any further disclosure of the information without the specific written consent of the person to whom it pertains, or as otherwise permitted by law. A general authorization for the release of medical or other information is NOT sufficient for this purpose.   The performance of this assay has not been  clinically validated in patients less than 65 years old.   For additional information please refer to http://education.[facility].com/faq/FAQ106.  (This link is being provided for informational/educational purposes only.)     Hepatitis C Antibody     Status: None   Collection Time: 11/20/15  7:57 AM  Result Value Ref Range   HCV Ab NEGATIVE NEGATIVE  HgB A1c     Status: Abnormal   Collection Time: 11/20/15  7:57 AM  Result Value Ref Range   Hgb A1c MFr Bld 7.5 (H) 4.6 - 6.5 %    Comment: Glycemic Control Guidelines for People with Diabetes:Non Diabetic:  <6%Goal of Therapy: <7%Additional Action Suggested:  >8%   LDL cholesterol, direct     Status: None   Collection Time: 11/20/15  7:57 AM  Result Value Ref Range   Direct LDL 91.0 mg/dL    Comment: Optimal:  <100 mg/dLNear or Above Optimal:  100-129 mg/dLBorderline High:  130-159 mg/dLHigh:  160-189 mg/dLVery High:  >190 mg/dL  Comp Met (CMET)     Status: Abnormal   Collection Time: 02/01/16  9:32 AM  Result Value Ref Range   Sodium 136 135 - 145 mEq/L   Potassium 3.8  3.5 - 5.1 mEq/L   Chloride 99 96 - 112 mEq/L   CO2 27 19 - 32 mEq/L   Glucose, Bld 189 (H) 70 - 99 mg/dL   BUN 14 6 - 23 mg/dL   Creatinine, Ser 0.84 0.40 - 1.50 mg/dL   Total Bilirubin 0.7 0.2 - 1.2 mg/dL   Alkaline Phosphatase 72 39 - 117 U/L   AST 15 0 - 37 U/L   ALT 22 0 - 53 U/L   Total Protein 7.0 6.0 - 8.3 g/dL   Albumin 4.0 3.5 - 5.2 g/dL   Calcium 9.5 8.4 - 10.5 mg/dL   GFR 100.12 >60.00 mL/min  Direct LDL     Status: None   Collection Time: 02/01/16  9:32 AM  Result Value Ref Range   Direct LDL 63.0 mg/dL    Comment: Optimal:  <100 mg/dLNear or Above Optimal:  100-129 mg/dLBorderline High:  130-159 mg/dLHigh:  160-189 mg/dLVery High:  >190 mg/dL    Radiology No results found.   Assessment/Plan  Hyperlipidemia lipid control important in reducing the progression of atherosclerotic disease. Continue statin therapy   Essential  hypertension blood pressure control important in reducing the progression of atherosclerotic disease. On appropriate oral medications.   DM (diabetes mellitus) type II uncontrolled, periph vascular disorder blood glucose control important in reducing the progression of atherosclerotic disease. Also, involved in wound healing. On appropriate medications.   Tobacco use disorder We had a discussion for approximately 4 minutes regarding the absolute need for smoking cessation due to the deleterious nature of tobacco on the vascular system. We discussed the tobacco use would diminish patency of any intervention, and likely significantly worsen progressio of disease. We discussed multiple agents for quitting including replacement therapy or medications to reduce cravings such as Chantix. The patient voices their understanding of the importance of smoking cessation.   Atherosclerosis of native arteries of extremity with intermittent claudication [name] Recommend:  The patient has experienced increased symptoms and is now describing lifestyle limiting claudication and mild rest pain.   Given the severity of the patient's lower extremity symptoms the patient should undergo angiography and intervention.  Risk and benefits were reviewed the patient.  Indications for the procedure were reviewed.  All questions were answered, the patient agrees to proceed.   The patient should continue walking and begin a more formal exercise program.  The patient should continue antiplatelet therapy and aggressive treatment of the lipid abnormalities  The patient will follow up with me after the angiogram.     Leotis Pain, MD  02/05/2016 4:32 PM    This note was created with Dragon medical transcription system.  Any errors from dictation are purely unintentional

## 2016-02-05 NOTE — Assessment & Plan Note (Signed)
blood glucose control important in reducing the progression of atherosclerotic disease. Also, involved in wound healing. On appropriate medications.  

## 2016-02-05 NOTE — Patient Instructions (Signed)

## 2016-02-05 NOTE — Assessment & Plan Note (Signed)
We had a discussion for approximately 4 minutes regarding the absolute need for smoking cessation due to the deleterious nature of tobacco on the vascular system. We discussed the tobacco use would diminish patency of any intervention, and likely significantly worsen progressio of disease. We discussed multiple agents for quitting including replacement therapy or medications to reduce cravings such as Chantix. The patient voices their understanding of the importance of smoking cessation.  

## 2016-02-05 NOTE — Assessment & Plan Note (Signed)
lipid control important in reducing the progression of atherosclerotic disease. Continue statin therapy  

## 2016-02-08 ENCOUNTER — Encounter (INDEPENDENT_AMBULATORY_CARE_PROVIDER_SITE_OTHER): Payer: Self-pay

## 2016-02-09 ENCOUNTER — Other Ambulatory Visit (INDEPENDENT_AMBULATORY_CARE_PROVIDER_SITE_OTHER): Payer: Self-pay | Admitting: Vascular Surgery

## 2016-02-16 ENCOUNTER — Other Ambulatory Visit: Payer: Self-pay | Admitting: Family Medicine

## 2016-02-16 DIAGNOSIS — L97511 Non-pressure chronic ulcer of other part of right foot limited to breakdown of skin: Secondary | ICD-10-CM | POA: Diagnosis not present

## 2016-02-18 DIAGNOSIS — Z89422 Acquired absence of other left toe(s): Secondary | ICD-10-CM | POA: Diagnosis not present

## 2016-02-18 DIAGNOSIS — E1140 Type 2 diabetes mellitus with diabetic neuropathy, unspecified: Secondary | ICD-10-CM | POA: Diagnosis not present

## 2016-02-18 DIAGNOSIS — L97511 Non-pressure chronic ulcer of other part of right foot limited to breakdown of skin: Secondary | ICD-10-CM | POA: Diagnosis not present

## 2016-02-18 DIAGNOSIS — B351 Tinea unguium: Secondary | ICD-10-CM | POA: Diagnosis not present

## 2016-02-23 ENCOUNTER — Encounter
Admission: RE | Admit: 2016-02-23 | Discharge: 2016-02-23 | Disposition: A | Discharge status: Home / Self Care | Payer: Medicare Other | Source: Ambulatory Visit | Attending: Vascular Surgery | Admitting: Vascular Surgery

## 2016-02-23 DIAGNOSIS — E785 Hyperlipidemia, unspecified: Secondary | ICD-10-CM | POA: Diagnosis not present

## 2016-02-23 DIAGNOSIS — Z7902 Long term (current) use of antithrombotics/antiplatelets: Secondary | ICD-10-CM | POA: Diagnosis not present

## 2016-02-23 DIAGNOSIS — E119 Type 2 diabetes mellitus without complications: Secondary | ICD-10-CM | POA: Diagnosis not present

## 2016-02-23 DIAGNOSIS — Z833 Family history of diabetes mellitus: Secondary | ICD-10-CM | POA: Diagnosis not present

## 2016-02-23 DIAGNOSIS — Z89429 Acquired absence of other toe(s), unspecified side: Secondary | ICD-10-CM | POA: Diagnosis not present

## 2016-02-23 DIAGNOSIS — I70211 Atherosclerosis of native arteries of extremities with intermittent claudication, right leg: Secondary | ICD-10-CM | POA: Diagnosis not present

## 2016-02-23 DIAGNOSIS — I10 Essential (primary) hypertension: Secondary | ICD-10-CM | POA: Diagnosis not present

## 2016-02-23 DIAGNOSIS — Z7984 Long term (current) use of oral hypoglycemic drugs: Secondary | ICD-10-CM | POA: Diagnosis not present

## 2016-02-23 DIAGNOSIS — F17210 Nicotine dependence, cigarettes, uncomplicated: Secondary | ICD-10-CM | POA: Diagnosis not present

## 2016-02-23 DIAGNOSIS — Z8249 Family history of ischemic heart disease and other diseases of the circulatory system: Secondary | ICD-10-CM | POA: Diagnosis not present

## 2016-02-23 DIAGNOSIS — Z87442 Personal history of urinary calculi: Secondary | ICD-10-CM | POA: Diagnosis not present

## 2016-02-23 HISTORY — DX: Personal history of urinary calculi: Z87.442

## 2016-02-23 HISTORY — DX: Polyneuropathy, unspecified: G62.9

## 2016-02-23 HISTORY — DX: Unspecified osteoarthritis, unspecified site: M19.90

## 2016-02-23 LAB — BUN: BUN: 15 mg/dL (ref 6–20)

## 2016-02-23 LAB — CREATININE, SERUM
CREATININE: 0.91 mg/dL (ref 0.61–1.24)
GFR calc Af Amer: >60 mL/min (ref >60)
GFR calc non Af Amer: >60 mL/min (ref >60)

## 2016-02-24 DIAGNOSIS — E1140 Type 2 diabetes mellitus with diabetic neuropathy, unspecified: Secondary | ICD-10-CM | POA: Diagnosis not present

## 2016-02-24 DIAGNOSIS — L97511 Non-pressure chronic ulcer of other part of right foot limited to breakdown of skin: Secondary | ICD-10-CM | POA: Diagnosis not present

## 2016-02-25 ENCOUNTER — Ambulatory Visit
Admission: RE | Admit: 2016-02-25 | Discharge: 2016-02-25 | Disposition: A | Discharge status: Home / Self Care | Payer: Medicare Other | Source: Ambulatory Visit | Attending: Vascular Surgery | Admitting: Vascular Surgery

## 2016-02-25 ENCOUNTER — Encounter
Admission: RE | Disposition: A | Discharge status: Home / Self Care | Payer: Self-pay | Source: Ambulatory Visit | Attending: Vascular Surgery

## 2016-02-25 DIAGNOSIS — Z87442 Personal history of urinary calculi: Secondary | ICD-10-CM | POA: Insufficient documentation

## 2016-02-25 DIAGNOSIS — E119 Type 2 diabetes mellitus without complications: Secondary | ICD-10-CM | POA: Diagnosis not present

## 2016-02-25 DIAGNOSIS — F17210 Nicotine dependence, cigarettes, uncomplicated: Secondary | ICD-10-CM | POA: Insufficient documentation

## 2016-02-25 DIAGNOSIS — Z7984 Long term (current) use of oral hypoglycemic drugs: Secondary | ICD-10-CM | POA: Insufficient documentation

## 2016-02-25 DIAGNOSIS — Z833 Family history of diabetes mellitus: Secondary | ICD-10-CM | POA: Insufficient documentation

## 2016-02-25 DIAGNOSIS — I70211 Atherosclerosis of native arteries of extremities with intermittent claudication, right leg: Secondary | ICD-10-CM | POA: Insufficient documentation

## 2016-02-25 DIAGNOSIS — I10 Essential (primary) hypertension: Secondary | ICD-10-CM | POA: Diagnosis not present

## 2016-02-25 DIAGNOSIS — Z8249 Family history of ischemic heart disease and other diseases of the circulatory system: Secondary | ICD-10-CM | POA: Insufficient documentation

## 2016-02-25 DIAGNOSIS — Z89429 Acquired absence of other toe(s), unspecified side: Secondary | ICD-10-CM | POA: Diagnosis not present

## 2016-02-25 DIAGNOSIS — E785 Hyperlipidemia, unspecified: Secondary | ICD-10-CM | POA: Diagnosis not present

## 2016-02-25 DIAGNOSIS — Z7902 Long term (current) use of antithrombotics/antiplatelets: Secondary | ICD-10-CM | POA: Insufficient documentation

## 2016-02-25 HISTORY — PX: LOWER EXTREMITY INTERVENTION: CATH118252

## 2016-02-25 HISTORY — PX: LOWER EXTREMITY ANGIOGRAPHY: CATH118251

## 2016-02-25 LAB — GLUCOSE, CAPILLARY: Glucose-Capillary: 111 mg/dL — ABNORMAL HIGH (ref 65–99)

## 2016-02-25 SURGERY — LOWER EXTREMITY ANGIOGRAPHY
Anesthesia: Moderate Sedation | Site: Leg Lower | Laterality: Right

## 2016-02-25 MED ORDER — METHYLPREDNISOLONE SODIUM SUCC 125 MG IJ SOLR: 125.0000 mg | INTRAMUSCULAR | Status: DC | PRN

## 2016-02-25 MED ORDER — CEFAZOLIN IN D5W 1 GM/50ML IV SOLN
1.0000 g | Freq: Once | INTRAVENOUS | Status: AC
  Administered 2016-02-25: 1 g via INTRAVENOUS

## 2016-02-25 MED ORDER — HEPARIN SODIUM (PORCINE) 1000 UNIT/ML IJ SOLN
INTRAMUSCULAR | Status: AC
Start: 2016-02-25 — End: 2016-02-25
  Filled 2016-02-25: qty 1

## 2016-02-25 MED ORDER — MIDAZOLAM HCL 5 MG/5ML IJ SOLN
INTRAMUSCULAR | Status: AC
  Filled 2016-02-25: qty 5

## 2016-02-25 MED ORDER — PHENOL 1.4 % MT LIQD: 1.0000 | OROMUCOSAL | Status: DC | PRN

## 2016-02-25 MED ORDER — ONDANSETRON HCL 4 MG/2ML IJ SOLN: 4.0000 mg | Freq: Four times a day (QID) | INTRAMUSCULAR | Status: DC | PRN

## 2016-02-25 MED ORDER — ACETAMINOPHEN 325 MG RE SUPP
325.0000 mg | RECTAL | Status: DC | PRN
  Filled 2016-02-25: qty 2

## 2016-02-25 MED ORDER — HYDRALAZINE HCL 20 MG/ML IJ SOLN: 5.0000 mg | INTRAMUSCULAR | Status: DC | PRN

## 2016-02-25 MED ORDER — OXYCODONE-ACETAMINOPHEN 5-325 MG PO TABS: 1.0000 | ORAL_TABLET | ORAL | Status: DC | PRN

## 2016-02-25 MED ORDER — SODIUM CHLORIDE 0.9 % IV SOLN: 500.0000 mL | Freq: Once | INTRAVENOUS | Status: DC | PRN

## 2016-02-25 MED ORDER — GUAIFENESIN-DM 100-10 MG/5ML PO SYRP: 15.0000 mL | ORAL_SOLUTION | ORAL | Status: DC | PRN

## 2016-02-25 MED ORDER — FENTANYL CITRATE (PF) 100 MCG/2ML IJ SOLN
INTRAMUSCULAR | Status: AC
  Filled 2016-02-25: qty 2

## 2016-02-25 MED ORDER — FENTANYL CITRATE (PF) 100 MCG/2ML IJ SOLN
INTRAMUSCULAR | Status: DC | PRN
  Administered 2016-02-25 (×2): 50 ug via INTRAVENOUS

## 2016-02-25 MED ORDER — LIDOCAINE HCL (PF) 1 % IJ SOLN
INTRAMUSCULAR | Status: AC
  Filled 2016-02-25: qty 10

## 2016-02-25 MED ORDER — SODIUM CHLORIDE 0.9 % IV SOLN
INTRAVENOUS | Status: DC
  Administered 2016-02-25: 1000 mL via INTRAVENOUS

## 2016-02-25 MED ORDER — LABETALOL HCL 5 MG/ML IV SOLN: 10.0000 mg | INTRAVENOUS | Status: DC | PRN

## 2016-02-25 MED ORDER — HEPARIN (PORCINE) IN NACL 2-0.9 UNIT/ML-% IJ SOLN
INTRAMUSCULAR | Status: AC
  Filled 2016-02-25: qty 1000

## 2016-02-25 MED ORDER — HYDROMORPHONE HCL 1 MG/ML IJ SOLN: 0.5000 mg | INTRAMUSCULAR | Status: DC | PRN

## 2016-02-25 MED ORDER — CEFUROXIME SODIUM 1.5 G IJ SOLR: 1.5000 g | INTRAMUSCULAR | Status: DC

## 2016-02-25 MED ORDER — ACETAMINOPHEN 325 MG PO TABS: 325.0000 mg | ORAL_TABLET | ORAL | Status: DC | PRN

## 2016-02-25 MED ORDER — FAMOTIDINE 20 MG PO TABS: 40.0000 mg | ORAL_TABLET | ORAL | Status: DC | PRN

## 2016-02-25 MED ORDER — MIDAZOLAM HCL 2 MG/2ML IJ SOLN
INTRAMUSCULAR | Status: DC | PRN
  Administered 2016-02-25 (×2): 2 mg via INTRAVENOUS

## 2016-02-25 MED ORDER — IOPAMIDOL (ISOVUE-300) INJECTION 61%
INTRAVENOUS | Status: DC | PRN
  Administered 2016-02-25: 70 mL via INTRA_ARTERIAL

## 2016-02-25 MED ORDER — HEPARIN SODIUM (PORCINE) 1000 UNIT/ML IJ SOLN
INTRAMUSCULAR | Status: DC | PRN
  Administered 2016-02-25: 5000 [IU] via INTRAVENOUS

## 2016-02-25 MED ORDER — HYDROMORPHONE HCL 1 MG/ML IJ SOLN: 1.0000 mg | Freq: Once | INTRAMUSCULAR | Status: DC

## 2016-02-25 MED ORDER — METOPROLOL TARTRATE 5 MG/5ML IV SOLN: 2.0000 mg | INTRAVENOUS | Status: DC | PRN

## 2016-02-25 SURGICAL SUPPLY — 21 items
BALLN LUTONIX 4X150X130 (BALLOONS) ×4
BALLN LUTONIX 6X150X130 (BALLOONS) ×4
BALLN ULTRVRSE 3.5X100X150 (BALLOONS) ×4
BALLOON LUTONIX 4X150X130 (BALLOONS) ×2 IMPLANT
BALLOON LUTONIX 6X150X130 (BALLOONS) ×2 IMPLANT
BALLOON ULTRVRSE 3.5X100X150 (BALLOONS) ×2 IMPLANT
CATH CXI SUPP ANG 4FR 135 (MICROCATHETER) ×2 IMPLANT
CATH CXI SUPP ANG 4FR 135CM (MICROCATHETER) ×4
CATH PIG 70CM (CATHETERS) ×4 IMPLANT
CATH VERT 100CM (CATHETERS) ×4 IMPLANT
DEVICE PRESTO INFLATION (MISCELLANEOUS) ×4 IMPLANT
DEVICE STARCLOSE SE CLOSURE (Vascular Products) ×4 IMPLANT
GLIDEWIRE ADV .035X260CM (WIRE) ×4 IMPLANT
PACK ANGIOGRAPHY (CUSTOM PROCEDURE TRAY) ×4 IMPLANT
SHEATH ANL2 6FRX45 HC (SHEATH) ×4 IMPLANT
SHEATH BRITE TIP 5FRX11 (SHEATH) ×4 IMPLANT
STENT VIABAHN 6X150X120 (Permanent Stent) ×4 IMPLANT
SYR MEDRAD MARK V 150ML (SYRINGE) ×4 IMPLANT
TUBING CONTRAST HIGH PRESS 72 (TUBING) ×4 IMPLANT
WIRE G V18X300CM (WIRE) ×4 IMPLANT
WIRE J 3MM .035X145CM (WIRE) ×4 IMPLANT

## 2016-02-25 NOTE — Discharge Instructions (Signed)
Angiogram, Care After °These instructions give you information about caring for yourself after your procedure. Your doctor may also give you more specific instructions. Call your doctor if you have any problems or questions after your procedure. °Follow these instructions at home: °· Take medicines only as told by your doctor. °· Follow your doctor's instructions about: °¨ Care of the area where the tube was inserted. °¨ Bandage (dressing) changes and removal. °· You may shower 24-48 hours after the procedure or as told by your doctor. °· Do not take baths, swim, or use a hot tub until your doctor approves. °· Every day, check the area where the tube was inserted. Watch for: °¨ Redness, swelling, or pain. °¨ Fluid, blood, or pus. °· Do not apply powder or lotion to the site. °· Do not lift anything that is heavier than 10 lb (4.5 kg) for 5 days or as told by your doctor. °· Ask your doctor when you can: °¨ Return to work or school. °¨ Do physical activities or play sports. °¨ Have sex. °· Do not drive or operate heavy machinery for 24 hours or as told by your doctor. °· Have someone with you for the first 24 hours after the procedure. °· Keep all follow-up visits as told by your doctor. This is important. °Contact a health care provider if: °· You have a fever. °· You have chills. °· You have more bleeding from the area where the tube was inserted. Hold pressure on the area. °· You have redness, swelling, or pain in the area where the tube was inserted. °· You have fluid or pus coming from the area. °Get help right away if: °· You have a lot of pain in the area where the tube was inserted. °· The area where the tube was inserted is bleeding, and the bleeding does not stop after 30 minutes of holding steady pressure on the area. °· The area near or just beyond the insertion site becomes pale, cool, tingly, or numb. °This information is not intended to replace advice given to you by your health care provider. Make  sure you discuss any questions you have with your health care provider. °Document Released: 05/06/2008 Document Revised: 07/16/2015 Document Reviewed: 07/11/2012 °Elsevier Interactive Patient Education © 2017 [employer] ° °

## 2016-02-25 NOTE — Op Note (Signed)
[facility] Percutaneous Study/Intervention Procedural Note   Date of Surgery: 02/25/2016  Surgeon(s):Sharvil Hoey   Assistants:none  Pre-operative Diagnosis: PAD with claudication right lower extremity  Post-operative diagnosis: Same  Procedure(s) Performed: 1. Ultrasound guidance for vascular access left femoral artery 2. Catheter placement into right posterior tibial artery from left femoral approach 3. Aortogram and selective right lower extremity angiogram 4. Percutaneous transluminal angioplasty of posterior tibial artery with 3.5 mm diameter angioplasty balloon and of the tibioperoneal trunk and distal popliteal artery with 4 mm diameter Lutonix drug-coated angioplasty balloon 5. Percutaneous transluminal angioplasty of the distal right SFA and popliteal artery with 6 mm diameter by 15 cm length Lutonix drug-coated angioplasty balloon  6.  Viabahn stent placement to the distal right SFA and popliteal artery with 6 mm diameter by 15 cm length stent for residual stenosis and flow-limiting dissection after angioplasty 7. StarClose closure device left femoral artery  EBL: 10 cc  Contrast: 70 cc  Fluoro Time: 6.4 minutes  Moderate Conscious Sedation Time: approximately 40 minutes using 4 mg of Versed and 100 mcg of Fentanyl  Indications: Patient is a 56 y.o.male with short distance claudication of the right lower extremity which has worsened over several months. He has known peripheral arterial disease and is status post left lower extremity revascularization in the past. The patient is brought in for angiography for further evaluation and potential treatment. Risks and benefits are discussed and informed consent is obtained  Procedure: The patient was identified and appropriate procedural time out was performed. The patient was then placed supine on the table and  prepped and draped in the usual sterile fashion.Moderate conscious sedation was administered during a face to face encounter with the patient throughout the procedure with my supervision of the RN administering medicines and monitoring the patient's vital signs, pulse oximetry, telemetry and mental status throughout from the start of the procedure until the patient was taken to the recovery room. Ultrasound was used to evaluate the left common femoral artery. It was patent . A digital ultrasound image was acquired. A Seldinger needle was used to access the left common femoral artery under direct ultrasound guidance and a permanent image was performed. A 0.035 J wire was advanced without resistance and a 5Fr sheath was placed. Pigtail catheter was placed into the aorta and an AP aortogram was performed. This demonstrated normal renal arteries and normal aorta and iliac segments without significant stenosis. I then crossed the aortic bifurcation and advanced to the right femoral head. Selective right lower extremity angiogram was then performed. This demonstrated normal common femoral artery and proximal to mid superficial femoral artery. In the distal superficial femoral artery at just above Hunter's canal was an occlusion with reconstitution of the popliteal artery at and just above the knee. There was then further occlusion downstream in the tibioperoneal trunk and proximal peroneal and posterior tibial arteries with the posterior tibial artery being the best reconstituted runoff distally into the foot. The patient was systemically heparinized and a 6 [place] [employer] sheath was then placed over the [employer] wire. I then used a Kumpe catheter and the advantage wire to navigate through the occlusion in the SFA and into the popliteal artery. I then exchanged for a 135 cm CXI catheter and was able to cross the tibioperoneal trunk and proximal posterior tibial artery occlusion and confirm intraluminal flow  in the mid posterior tibial artery. I then placed a 0.018 wire and proceeded with treatment. The posterior tibial artery was  treated with a 3.5 mm diameter by 10 cm length angioplasty balloon inflated to 8 atm for 1 minute and the proximal posterior tibial artery. The tibioperoneal trunk and distal popliteal artery with then treated with a 4 mm diameter by 15 cm length Lutonix drug-coated angioplasty balloon. This was inflated to 8 atm for 1 minute. For the remainder of the popliteal artery and the most distal superficial femoral artery 6 mm diameter by 15 cm length Lutonix drug-coated angioplasty balloon was selected and inflated to 10 atm for 1 minute. Completion angiogram showed about a 30% residual stenosis in the posterior tibial artery as well as about a 30-35% residual stenosis in the tibioperoneal trunk and distal popliteal artery that did not appear flow limiting. There was a dissection in the above-knee popliteal artery that appeared to create flow limitation and a 50% residual stenosis at the entry point in the distal SFA. I elected to treat this area with a stent. A 6 mm diameter by 15 cm length Viabahn covered stent was deployed to encompass the most distal SFA and the popliteal artery essentially to the knee to cover the dissection. This was postdilated with a 6 mm balloon with an excellent angiographic completion result and less than 10% residual stenosis. I elected to terminate the procedure. The sheath was removed and StarClose closure device was deployed in the left femoral artery with excellent hemostatic result. The patient was taken to the recovery room in stable condition having tolerated the procedure well.  Findings:  Aortogram: normal renal arteries, normal aorta and normal iliac arteries Right Lower Extremity: Normal common femoral artery and proximal to mid superficial femoral artery. In the distal superficial femoral artery at just above Hunter's canal was  an occlusion with reconstitution of the popliteal artery at and just above the knee. There was then further occlusion downstream in the tibioperoneal trunk and proximal peroneal and posterior tibial arteries with the posterior tibial artery being the best reconstituted runoff distally into the foot.   Disposition: Patient was taken to the recovery room in stable condition having tolerated the procedure well.  Complications: None  Leotis Pain 02/25/2016 10:26 AM   This note was created with Dragon Medical transcription system. Any errors in dictation are purely unintentional.

## 2016-02-25 NOTE — H&P (Signed)
[facility] History & Physical Update  The patient was interviewed and re-examined.  The patient's previous History and Physical has been reviewed and is unchanged.  There is no change in the plan of care. We plan to proceed with the scheduled procedure.  Festus BarrenJason Dew, MD  02/25/2016, 8:18 AM

## 2016-02-26 ENCOUNTER — Encounter: Payer: Self-pay | Admitting: Vascular Surgery

## 2016-03-02 ENCOUNTER — Encounter: Payer: Self-pay | Admitting: Family Medicine

## 2016-03-02 ENCOUNTER — Ambulatory Visit (INDEPENDENT_AMBULATORY_CARE_PROVIDER_SITE_OTHER): Payer: Medicare Other | Admitting: Family Medicine

## 2016-03-02 DIAGNOSIS — L97511 Non-pressure chronic ulcer of other part of right foot limited to breakdown of skin: Secondary | ICD-10-CM | POA: Diagnosis not present

## 2016-03-02 DIAGNOSIS — I739 Peripheral vascular disease, unspecified: Secondary | ICD-10-CM

## 2016-03-02 DIAGNOSIS — E1140 Type 2 diabetes mellitus with diabetic neuropathy, unspecified: Secondary | ICD-10-CM | POA: Diagnosis not present

## 2016-03-02 MED ORDER — TRAMADOL HCL 50 MG PO TABS: ORAL_TABLET | ORAL | 0 refills | Status: DC

## 2016-03-02 NOTE — Progress Notes (Signed)
Pre visit review using our clinic review tool, if applicable. No additional management support is needed unless otherwise documented below in the visit note. 

## 2016-03-02 NOTE — Patient Instructions (Signed)
Nice to see you. I will refill your tramadol. If your pain does not continue to improve or if it worsens or if you develop swelling or any new symptoms in your leg you need to call the vascular surgeon's office to let them know so that they can get you in to be seen.

## 2016-03-02 NOTE — Assessment & Plan Note (Signed)
Patient with onset of pain shortly after angioplasty and stent placement on 02/25/16. Has slowly been improving. Has been taking some tramadol to help with this. Given that this was post procedure I did contact the vascular surgeon's office and spoke with their PA as both surgeons were out of the office at a procedure. I described the patient's symptoms and the time course. She reassured me and said this is likely related to reperfusion pain and that as long as it was continuing to improve they would monitor. She did advise if he had any worsening or change in symptoms that he contact their office to get set up for an arteriogram. I relayed this to the patient. We will refill his tramadol. He is given return precautions.

## 2016-03-02 NOTE — Progress Notes (Signed)
  Joseph AlarEric Manveer Gomes, MD Phone: [phone]  Joseph Singh is a 56 y.o. male who presents today for same-day visit.  Peripheral vascular disease: Patient reports he underwent angioplasty on 02/25/16. Notes he had no pain initially after this though several hours after the procedure when getting home he developed what he describes as a severe discomfort posteriorly in his leg down to his popliteal fossa and around to the front of his leg. He notes no swelling in his legs. He notes he could feel a grabbing and squeezing sensation while they were doing angioplasty. Reports it is worst when he gets up in the morning first thing and then for the first 20-30 minutes though improved somewhat throughout the day. Has improved quite a bit since last week though still present. He has been taking slightly more tramadol than previously for this.  PMH: Smoker   ROS see history of present illness  Objective  Physical Exam Vitals:   03/02/16 0903  BP: 140/82  Pulse: 85  Temp: 98.7 F (37.1 C)    BP Readings from Last 3 Encounters:  03/02/16 140/82  02/25/16 135/67  02/23/16 119/63   Wt Readings from Last 3 Encounters:  03/02/16 297 lb 6.4 oz (134.9 kg)  02/25/16 (!) 301 lb (136.5 kg)  02/23/16 (!) 301 lb (136.5 kg)    Physical Exam  Constitutional: No distress.  Cardiovascular: Normal rate, regular rhythm and normal heart sounds.   Pulmonary/Chest: Effort normal and breath sounds normal.  Musculoskeletal:  Bilateral legs with no tenderness or swelling, no tenderness specifically in the right posterior thigh, popliteal fossa, calf, and anterior right leg, bilateral calves are 41 cm, 1+ PT and DP pulses in the right foot in bilateral feet are warm  Neurological: He is alert. Gait normal.  Skin: Skin is warm and dry. He is not diaphoretic.     Assessment/Plan: Please see individual problem list.  Peripheral vascular disease [name] Patient with onset of pain shortly after angioplasty and  stent placement on 02/25/16. Has slowly been improving. Has been taking some tramadol to help with this. Given that this was post procedure I did contact the vascular surgeon's office and spoke with their PA as both surgeons were out of the office at a procedure. I described the patient's symptoms and the time course. She reassured me and said this is likely related to reperfusion pain and that as long as it was continuing to improve they would monitor. She did advise if he had any worsening or change in symptoms that he contact their office to get set up for an arteriogram. I relayed this to the patient. We will refill his tramadol. He is given return precautions.   No orders of the defined types were placed in this encounter.   Meds ordered this encounter  Medications  . traMADol (ULTRAM) 50 MG tablet    Sig: TAKE ONE (1) TABLET BY MOUTH 3 TIMES DAILY    Dispense:  180 tablet    Refill:  0    Joseph AlarEric Tangela Dolliver, MD [facility]

## 2016-03-11 ENCOUNTER — Ambulatory Visit: Payer: Medicare Other | Admitting: Family Medicine

## 2016-03-25 ENCOUNTER — Ambulatory Visit (INDEPENDENT_AMBULATORY_CARE_PROVIDER_SITE_OTHER): Payer: Medicare Other | Admitting: Vascular Surgery

## 2016-03-25 ENCOUNTER — Other Ambulatory Visit (INDEPENDENT_AMBULATORY_CARE_PROVIDER_SITE_OTHER): Payer: Self-pay | Admitting: Vascular Surgery

## 2016-03-25 ENCOUNTER — Encounter (INDEPENDENT_AMBULATORY_CARE_PROVIDER_SITE_OTHER): Payer: Self-pay | Admitting: Vascular Surgery

## 2016-03-25 ENCOUNTER — Other Ambulatory Visit (INDEPENDENT_AMBULATORY_CARE_PROVIDER_SITE_OTHER): Payer: Medicare Other

## 2016-03-25 VITALS — BP 118/67 | HR 84 | Resp 18 | Wt 285.6 lb

## 2016-03-25 DIAGNOSIS — I10 Essential (primary) hypertension: Secondary | ICD-10-CM

## 2016-03-25 DIAGNOSIS — E785 Hyperlipidemia, unspecified: Secondary | ICD-10-CM | POA: Diagnosis not present

## 2016-03-25 DIAGNOSIS — F17210 Nicotine dependence, cigarettes, uncomplicated: Secondary | ICD-10-CM

## 2016-03-25 DIAGNOSIS — E1151 Type 2 diabetes mellitus with diabetic peripheral angiopathy without gangrene: Secondary | ICD-10-CM | POA: Diagnosis not present

## 2016-03-25 DIAGNOSIS — IMO0002 Reserved for concepts with insufficient information to code with codable children: Secondary | ICD-10-CM

## 2016-03-25 DIAGNOSIS — E1165 Type 2 diabetes mellitus with hyperglycemia: Secondary | ICD-10-CM | POA: Diagnosis not present

## 2016-03-25 DIAGNOSIS — I70211 Atherosclerosis of native arteries of extremities with intermittent claudication, right leg: Secondary | ICD-10-CM

## 2016-03-25 DIAGNOSIS — F17200 Nicotine dependence, unspecified, uncomplicated: Secondary | ICD-10-CM

## 2016-03-25 NOTE — Assessment & Plan Note (Signed)
His noninvasive studies today demonstrate a marked improvement in the right ABI now up to 0.98 with brisk triphasic waveforms. Previously this was 0.60. His left ABI is stable at 1.02. Smoking cessation again stressed. Continue ASA/Plavix/Lipitor Recheck in 3-4 months.

## 2016-03-25 NOTE — Progress Notes (Signed)
MRN : [id number]  Joseph Singh is a 56 y.o. (1960/03/11) male who presents with chief complaint of  Chief Complaint  Patient presents with  . Follow-up  .  History of Present Illness: Patient returns today in follow up of PAD. He had right lower extremity intervention performed about 4 weeks ago. He had no periprocedural complications. He says the sore on his right foot is improving but not all the way resolved. He says that the claudication symptoms he was having in his right calf have essentially resolved. His access site is well-healed. He continues to smoke. His noninvasive studies today demonstrate a marked improvement in the right ABI now up to 0.98 with brisk triphasic waveforms. Previously this was 0.60. His left ABI is stable at 1.02.         Current Outpatient Prescriptions  Medication Sig Dispense Refill  . amLODipine (NORVASC) 10 MG tablet Take 1 tablet (10 mg total) by mouth daily. 90 tablet 3  . atorvastatin (LIPITOR) 40 MG tablet Take 1 tablet (40 mg total) by mouth daily. 90 tablet 3  . clopidogrel (PLAVIX) 75 MG tablet TAKE ONE (1) TABLET BY MOUTH EVERY DAY 90 tablet 3  . cyclobenzaprine (FLEXERIL) 10 MG tablet Take 1 tablet (10 mg total) by mouth 3 (three) times daily as needed for muscle spasms. 30 tablet 2  . gabapentin (NEURONTIN) 300 MG capsule TAKE ONE (1) CAPSULE FOUR (4) TIMES EACH DAY 360 capsule 2  . glipiZIDE (GLUCOTROL XL) 10 MG 24 hr tablet Take 1 tablet (10 mg total) by mouth daily with breakfast. 90 tablet 3  . JARDIANCE 10 MG TABS tablet TAKE ONE (1) TABLET BY MOUTH EVERY DAY 90 mg 1  . lisinopril-hydrochlorothiazide (PRINZIDE,ZESTORETIC) 20-25 MG tablet Take 1 tablet by mouth daily. 90 tablet 3  . metFORMIN (GLUCOPHAGE) 1000 MG tablet TAKE 1 TABLET BY MOUTH TWICE DAILY WITH A MEAL 180 tablet 3  . traMADol (ULTRAM) 50 MG tablet TAKE ONE (1) TABLET BY MOUTH 3 TIMES DAILY 180 tablet 0   No current facility-administered medications for this visit.          Past Medical History:  Diagnosis Date  . Diabetes mellitus without complication ([place])   . Hyperlipidemia   . Hypertension   . Kidney stones          Past Surgical History:  Procedure Laterality Date  . TOE AMPUTATION      Social History Social History  Substance Use Topics  . Smoking status: Current Every Day Smoker    Packs/day: 1.00    Types: Cigarettes  . Smokeless tobacco: Never Used  . Alcohol use No   No IV drug use  Family History      Family History  Problem Relation Age of Onset  . Heart disease Mother   . Diabetes Father   No bleeding or clotting disorders  No Known Allergies   REVIEW OF SYSTEMS (Negative unless checked)  Constitutional: _0 Weight loss  _1 Fever  _2 Chills Cardiac: _3 Chest pain   _4 Chest pressure   _5 Palpitations   _6 Shortness of breath when laying flat   _7 Shortness of breath at rest   _8 Shortness of breath with exertion. Vascular:  _9 Pain in legs with walking   _10 Pain in legs at rest   _11 Pain in legs when laying flat   _12 Claudication   _13 Pain in feet when walking  _14 Pain in feet at rest  _15 Pain in feet when laying flat   _16 History of DVT   _17 Phlebitis   _18   Swelling in legs   _0 Varicose veins   _1 Non-healing ulcers Pulmonary:   _2 Uses home oxygen   _3 Productive cough   _4 Hemoptysis   _5 Wheeze  _6 COPD   _7 Asthma Neurologic:  _8 Dizziness  _9 Blackouts   _10 Seizures   _11 History of stroke   _12 History of TIA  _13 Aphasia   _14 Temporary blindness   _15 Dysphagia   _16 Weakness or numbness in arms   _17 Weakness or numbness in legs Musculoskeletal:  _18 Arthritis   _19 Joint swelling   _20 Joint pain   _21 Low back pain Hematologic:  _22 Easy bruising  _23 Easy bleeding   _24 Hypercoagulable state   _25 Anemic   Gastrointestinal:  _26 Blood in stool   _27 Vomiting blood  _28 Gastroesophageal reflux/heartburn   _29 Abdominal pain Genitourinary:  _30 Chronic kidney disease   _31 Difficult urination  _32 Frequent urination  _33 Burning with urination    _34 Hematuria Skin:  _35 Rashes   _36 Ulcers   _37 Wounds Psychological:  _38 History of anxiety   _39  History of major depression.  Physical Examination  BP 126/72 (BP Location: Right Arm)   Pulse 74   Resp 16   Ht _40  (1.93 m)   Wt (!) 301 lb (136.5 kg)   BMI 36.64 kg/m  Gen:  WD/WN, NAD Head: [place]/AT, No temporalis wasting. Ear/Nose/Throat: Hearing grossly intact, nares w/o erythema or drainage, trachea midline Eyes: Conjunctiva clear. Sclera non-icteric Neck: Supple.  No JVD.  Pulmonary:  Good air movement, no use of accessory muscles.  Cardiac: RRR, normal S1, S2 Vascular:  Vessel Right Left  Radial Palpable Palpable  Ulnar Palpable Palpable  Brachial Palpable Palpable  Carotid Palpable, without bruit Palpable, without bruit  Aorta Not palpable N/A  Femoral Palpable Palpable  Popliteal  Palpable Palpable  PT 1+ Palpable Palpable  DP  Palpable 1+ Palpable   Gastrointestinal: soft, non-tender/non-distended. No guarding/reflex.  Musculoskeletal: M/S 5/5 throughout.  No deformity or atrophy.  Neurologic: Sensation grossly intact in extremities.  Symmetrical.  Speech is fluent.  Psychiatric: Judgment intact, Mood & affect appropriate for pt's clinical situation. Dermatologic: No rashes or ulcers noted.  No cellulitis or open wounds. Lymph : No Cervical, Axillary, or Inguinal lymphadenopathy.      Labs Recent Results (from the past 2160 hour(s))  Comp Met (CMET)     Status: Abnormal   Collection Time: 02/01/16  9:32 AM  Result Value Ref Range   Sodium 136 135 - 145 mEq/L   Potassium 3.8 3.5 - 5.1 mEq/L   Chloride 99 96 - 112 mEq/L   CO2 27 19 - 32 mEq/L   Glucose, Bld 189 (H) 70 - 99 mg/dL   BUN 14 6 - 23 mg/dL   Creatinine, Ser 0.84 0.40 - 1.50 mg/dL   Total Bilirubin 0.7 0.2 - 1.2 mg/dL   Alkaline Phosphatase 72 39 - 117 U/L   AST 15 0 - 37 U/L   ALT 22 0 - 53 U/L   Total Protein 7.0 6.0 - 8.3 g/dL   Albumin 4.0 3.5 - 5.2 g/dL   Calcium 9.5 8.4 - 10.5 mg/dL    GFR 100.12 >60.00 mL/min  Direct LDL     Status: None   Collection Time: 02/01/16  9:32 AM  Result Value Ref Range   Direct LDL 63.0 mg/dL    Comment: Optimal:  <100 mg/dLNear or Above Optimal:  100-129 mg/dLBorderline High:  130-159 mg/dLHigh:  160-189 mg/dLVery High:  >190 mg/dL  BUN     Status: None   Collection Time: 02/23/16 11:17 AM  Result Value Ref Range   BUN 15 6 -  20 mg/dL  Creatinine, serum     Status: None   Collection Time: 02/23/16 11:17 AM  Result Value Ref Range   Creatinine, Ser 0.91 0.61 - 1.24 mg/dL   GFR calc non Af Amer >60 >60 mL/min   GFR calc Af Amer >60 >60 mL/min    Comment: (NOTE) The eGFR has been calculated using the CKD EPI equation. This calculation has not been validated in all clinical situations. eGFR's persistently <60 mL/min signify possible Chronic Kidney Disease.   Glucose, capillary     Status: Abnormal   Collection Time: 02/25/16  9:14 AM  Result Value Ref Range   Glucose-Capillary 111 (H) 65 - 99 mg/dL    Radiology No results found.    Assessment/Plan Hyperlipidemia lipid control important in reducing the progression of atherosclerotic disease. Continue statin therapy   Essential hypertension blood pressure control important in reducing the progression of atherosclerotic disease. On appropriate oral medications.   DM (diabetes mellitus) type II uncontrolled, periph vascular disorder blood glucose control important in reducing the progression of atherosclerotic disease. Also, involved in wound healing. On appropriate medications.   Tobacco use disorder We had a discussion for approximately 4 minutes regarding the absolute need for smoking cessation due to the deleterious nature of tobacco on the vascular system. We discussed the tobacco use would diminish patency of any intervention, and likely significantly worsen progressio of disease. We discussed multiple agents for quitting including replacement therapy or medications  to reduce cravings such as Chantix. The patient voices their understanding of the importance of smoking cessation. Atherosclerosis of native arteries of extremity with intermittent claudication ([place])  His noninvasive studies today demonstrate a marked improvement in the right ABI now up to 0.98 with brisk triphasic waveforms. Previously this was 0.60. His left ABI is stable at 1.02. Smoking cessation again stressed. Continue ASA/Plavix/Lipitor Recheck in 3-4 months.    Leotis Pain, MD  03/25/2016 2:33 PM    This note was created with Dragon medical transcription system.  Any errors from dictation are purely unintentional

## 2016-03-30 ENCOUNTER — Telehealth: Payer: Self-pay | Admitting: Family Medicine

## 2016-03-30 DIAGNOSIS — L97511 Non-pressure chronic ulcer of other part of right foot limited to breakdown of skin: Secondary | ICD-10-CM | POA: Diagnosis not present

## 2016-03-30 NOTE — Telephone Encounter (Signed)
03/11/2016 was a snow day. We opened late that day. Pt has a No show on that day.

## 2016-04-05 ENCOUNTER — Ambulatory Visit (INDEPENDENT_AMBULATORY_CARE_PROVIDER_SITE_OTHER): Payer: Medicare Other | Admitting: Family Medicine

## 2016-04-05 ENCOUNTER — Encounter: Payer: Self-pay | Admitting: Family Medicine

## 2016-04-05 VITALS — BP 126/70 | HR 84 | Temp 98.5°F | Wt 290.8 lb

## 2016-04-05 DIAGNOSIS — B9789 Other viral agents as the cause of diseases classified elsewhere: Secondary | ICD-10-CM

## 2016-04-05 DIAGNOSIS — E1165 Type 2 diabetes mellitus with hyperglycemia: Secondary | ICD-10-CM

## 2016-04-05 DIAGNOSIS — J069 Acute upper respiratory infection, unspecified: Secondary | ICD-10-CM | POA: Insufficient documentation

## 2016-04-05 DIAGNOSIS — I10 Essential (primary) hypertension: Secondary | ICD-10-CM

## 2016-04-05 DIAGNOSIS — I739 Peripheral vascular disease, unspecified: Secondary | ICD-10-CM

## 2016-04-05 DIAGNOSIS — E1151 Type 2 diabetes mellitus with diabetic peripheral angiopathy without gangrene: Secondary | ICD-10-CM

## 2016-04-05 DIAGNOSIS — L84 Corns and callosities: Secondary | ICD-10-CM | POA: Insufficient documentation

## 2016-04-05 DIAGNOSIS — IMO0002 Reserved for concepts with insufficient information to code with codable children: Secondary | ICD-10-CM

## 2016-04-05 NOTE — Progress Notes (Signed)
  Marikay AlarEric Chancy Smigiel, MD Phone: [phone]  Paulette Blanchhomas R Bosques is a 56 y.o. male who presents today for f/u.  DIABETES Disease Monitoring: Blood Sugar ranges-not checking Polyuria/phagia/dipsia- no       Medications: Compliance- taking metformin, glipizide, jardiance Hypoglycemic symptoms- no  HYPERTENSION  Disease Monitoring  Home BP Monitoring not checking Chest pain- no    Dyspnea- no Medications  Compliance-  Taking lisiopril/hctz, amlodipine, metoprolol  Edema- no  Peripheral arterial disease: Taking Plavix. Saw vascular surgery last week and reports things checked out well. No pain in his legs.  Patient does report he is following with podiatry for calluses on his right fourth and fifth toes. Notes they debrided them recently. He notes no erythema or drainage. No pain in these areas. He is putting gauze in between his toes to help protect them.  He notes a little bit of congestion in his nose and throat and chest over the last several days. Minimal cough. Feels as though it is just a cold.  PMH: Smoker   ROS see history of present illness  Objective  Physical Exam Vitals:   04/05/16 1543  BP: 126/70  Pulse: 84  Temp: 98.5 F (36.9 C)    BP Readings from Last 3 Encounters:  04/05/16 126/70  03/25/16 118/67  03/02/16 140/82   Wt Readings from Last 3 Encounters:  04/05/16 290 lb 12.8 oz (131.9 kg)  03/25/16 285 lb 9.6 oz (129.5 kg)  03/02/16 297 lb 6.4 oz (134.9 kg)    Physical Exam  Constitutional: No distress.  HENT:  Head: Normocephalic and atraumatic.  Mouth/Throat: Oropharynx is clear and moist. No oropharyngeal exudate.  Eyes: Conjunctivae are normal. Pupils are equal, round, and reactive to light.  Cardiovascular: Normal rate, regular rhythm and normal heart sounds.   Pulmonary/Chest: Effort normal and breath sounds normal.  Musculoskeletal: He exhibits no edema.  Neurological: He is alert. Gait normal.  Skin: Skin is warm and dry. He is not  diaphoretic.  Fibular aspect right fourth toe with small callus noted with no drainage, erythema, warmth, or tenderness, tibial aspect right fifth toe with no callus noted     Assessment/Plan: Please see individual problem list.  DM (diabetes mellitus) type II uncontrolled, periph vascular disorder Continues to improve with control. Encouraged continued diet and exercise. He'll continue his current medications and will recheck in 3 months.  Essential hypertension At goal. Continue current medications.  Peripheral vascular disease [name] Improved. Continue to follow with vascular surgery.  Viral URI Suspect viruses cause of his congestion. He'll monitor and if symptoms worsen or do not improve he'll let us know.  Callus of foot Patient with a callus on right fourth toe. No signs of infection. Nontender. Discussed monitoring this very closely and continuing to provide barrier protection between his fourth and fifth toes. He'll continue to follow with podiatry regarding this. He is given return precautions.   Orders Placed This Encounter  Procedures  . POCT HgB A1C    Marikay AlarEric Jaydalyn Demattia, MD [facility]

## 2016-04-05 NOTE — Patient Instructions (Signed)
Nice to see you. Please keep an eye on your right foot. If you develop drainage, redness, warmth, or fevers please seek medical attention immediately. Please work on your diet for your diabetes and cholesterol. Please also exercise.

## 2016-04-05 NOTE — Assessment & Plan Note (Signed)
Patient with a callus on right fourth toe. No signs of infection. Nontender. Discussed monitoring this very closely and continuing to provide barrier protection between his fourth and fifth toes. He'll continue to follow with podiatry regarding this. He is given return precautions.

## 2016-04-05 NOTE — Assessment & Plan Note (Signed)
At goal. Continue current medications. 

## 2016-04-05 NOTE — Assessment & Plan Note (Signed)
Improved. Continue to follow with vascular surgery. 

## 2016-04-05 NOTE — Assessment & Plan Note (Signed)
Continues to improve with control. Encouraged continued diet and exercise. He'll continue his current medications and will recheck in 3 months.

## 2016-04-05 NOTE — Assessment & Plan Note (Signed)
Suspect viruses cause of his congestion. He'll monitor and if symptoms worsen or do not improve he'll let us know.

## 2016-04-05 NOTE — Progress Notes (Signed)
Pre visit review using our clinic review tool, if applicable. No additional management support is needed unless otherwise documented below in the visit note. 

## 2016-04-06 LAB — POCT GLYCOSYLATED HEMOGLOBIN (HGB A1C): HEMOGLOBIN A1C: 7.2

## 2016-04-08 ENCOUNTER — Encounter: Payer: Self-pay | Admitting: Vascular Surgery

## 2016-04-27 ENCOUNTER — Other Ambulatory Visit: Payer: Self-pay | Admitting: Family Medicine

## 2016-04-27 DIAGNOSIS — L851 Acquired keratosis [keratoderma] palmaris et plantaris: Secondary | ICD-10-CM | POA: Diagnosis not present

## 2016-04-27 DIAGNOSIS — B351 Tinea unguium: Secondary | ICD-10-CM | POA: Diagnosis not present

## 2016-04-27 DIAGNOSIS — E1140 Type 2 diabetes mellitus with diabetic neuropathy, unspecified: Secondary | ICD-10-CM | POA: Diagnosis not present

## 2016-04-27 NOTE — Telephone Encounter (Signed)
Last OV 04/05/16 last filled 03/02/16 180 0rf

## 2016-04-29 NOTE — Telephone Encounter (Signed)
Patient picked up rx.

## 2016-06-13 ENCOUNTER — Encounter: Payer: Self-pay | Admitting: Family

## 2016-06-13 ENCOUNTER — Ambulatory Visit (INDEPENDENT_AMBULATORY_CARE_PROVIDER_SITE_OTHER): Payer: Medicare Other | Admitting: Family

## 2016-06-13 VITALS — BP 144/70 | HR 69 | Temp 98.0°F | Ht 76.0 in | Wt 292.8 lb

## 2016-06-13 DIAGNOSIS — S40862A Insect bite (nonvenomous) of left upper arm, initial encounter: Secondary | ICD-10-CM

## 2016-06-13 DIAGNOSIS — W57XXXA Bitten or stung by nonvenomous insect and other nonvenomous arthropods, initial encounter: Secondary | ICD-10-CM

## 2016-06-13 MED ORDER — DOXYCYCLINE HYCLATE 100 MG PO TABS: 100.0000 mg | ORAL_TABLET | Freq: Two times a day (BID) | ORAL | 0 refills | Status: DC

## 2016-06-13 NOTE — Progress Notes (Signed)
Subjective:    Patient ID: Joseph Singh, male    DOB: 1960-06-08, 56 y.o.   MRN: [id number]  CC: Joseph Singh is a 56 y.o. male who presents today for an acute visit.    HPI: CC: tick bite noticed 3 days ago. Unsure how long attached. Describes as deer tick with white spot.   'head still in skin.' Over all feels well.   No arthralgia, fever, rash, N, V      HISTORY:  Past Medical History:  Diagnosis Date  . Arthritis   . Diabetes mellitus without complication [name]   . History of kidney stones   . Hyperlipidemia   . Hypertension   . Kidney stones   . Neuropathy    Past Surgical History:  Procedure Laterality Date  . LOWER EXTREMITY ANGIOGRAPHY Right 02/25/2016   Procedure: Lower Extremity Angiography;  Surgeon: Annice Needy, MD;  Location: [facility] INVASIVE CV LAB;  Service: Cardiovascular;  Laterality: Right;  . LOWER EXTREMITY INTERVENTION  02/25/2016   Procedure: Lower Extremity Intervention;  Surgeon: Annice Needy, MD;  Location: [facility] INVASIVE CV LAB;  Service: Cardiovascular;;  . TOE AMPUTATION     Family History  Problem Relation Age of Onset  . Diabetes Mother   . Heart disease Father     Allergies: Patient has no known allergies. Current Outpatient Prescriptions on File Prior to Visit  Medication Sig Dispense Refill  . amLODipine (NORVASC) 10 MG tablet Take 1 tablet (10 mg total) by mouth daily. 90 tablet 3  . atorvastatin (LIPITOR) 40 MG tablet Take 1 tablet (40 mg total) by mouth daily. 90 tablet 3  . clopidogrel (PLAVIX) 75 MG tablet TAKE ONE (1) TABLET BY MOUTH EVERY DAY 90 tablet 3  . cyclobenzaprine (FLEXERIL) 10 MG tablet Take 1 tablet (10 mg total) by mouth 3 (three) times daily as needed for muscle spasms. 30 tablet 2  . gabapentin (NEURONTIN) 300 MG capsule TAKE ONE (1) CAPSULE FOUR (4) TIMES EACH DAY (Patient taking differently: Take 300 mg by mouth 4 (four) times daily as needed. TAKE ONE (1) CAPSULE FOUR (4) TIMES EACH DAY) 360 capsule 2  . glipiZIDE  (GLUCOTROL XL) 10 MG 24 hr tablet Take 1 tablet (10 mg total) by mouth daily with breakfast. 90 tablet 3  . JARDIANCE 10 MG TABS tablet TAKE ONE (1) TABLET BY MOUTH EVERY DAY 90 mg 1  . lisinopril-hydrochlorothiazide (PRINZIDE,ZESTORETIC) 20-25 MG tablet Take 1 tablet by mouth daily. (Patient taking differently: Take 0.5 tablets by mouth daily. ) 90 tablet 3  . metFORMIN (GLUCOPHAGE) 1000 MG tablet TAKE 1 TABLET BY MOUTH TWICE DAILY WITH A MEAL 180 tablet 3  . metoprolol succinate (TOPROL-XL) 50 MG 24 hr tablet Take 50 mg by mouth 2 (two) times daily. Take with or immediately following a meal.     . mupirocin ointment (BACTROBAN) 2 % Place 1 application into the nose 2 (two) times daily. For 7 days. Started 02/16/16    . traMADol (ULTRAM) 50 MG tablet TAKE ONE (1) TABLET BY MOUTH 3 TIMES DAILY 180 tablet 1   No current facility-administered medications on file prior to visit.     Social History  Substance Use Topics  . Smoking status: Current Every Day Smoker    Packs/day: 1.00    Years: 35.00    Types: Cigarettes  . Smokeless tobacco: Never Used  . Alcohol use No    Review of Systems  Constitutional: Negative for chills and fever.  Respiratory: Negative for cough.   Cardiovascular: Negative for chest pain and palpitations.  Gastrointestinal: Negative for nausea and vomiting.  Musculoskeletal: Negative for myalgias.      Objective:    BP (!) 144/70   Pulse 69   Temp 98 F (36.7 C) (Oral)   Ht  (1.93 m)   Wt 292 lb 12.8 oz (132.8 kg)   SpO2 98%   BMI 35.64 kg/m    Physical Exam  Constitutional: He appears well-developed and well-nourished.  Cardiovascular: Regular rhythm and normal heart sounds.   Pulmonary/Chest: Effort normal and breath sounds normal. No respiratory distress. He has no wheezes. He has no rhonchi. He has no rales.  Neurological: He is alert.  Skin: Skin is warm and dry.  Psychiatric: He has a normal mood and affect. His speech is normal and  behavior is normal.  Vitals reviewed.  Patient does not have previous history of MRSA.  Patient does not have diabetes.   The patient gave informed consent for the procedure. Patient and I discussed risks, benefits, and alternatives to I & D.  Including risk of infection from laceration, localized pain, and bleeding. We discussed the option for oral antibiotics. Patient verbalized understanding to conversation and all questions were answered. We jointly decided to proceed with procedure.  On exam, there is a black object noted left upper arm with surrounding erythema, approx 2cm in diameter  It is tender to palpation.  There is erythema with no discharge.  The patient gave informed consent for the procedure. The area was prepped with antiseptic solution. Declined anesthetic.   #11 blade was used to make pin point hole and then to use gentle compression to bring foreign object to surface. Small black speck remained in arm. Bloody drainage was noted. Black pieces removed with forceps.   Bleeding from the wound was controlled by applying direct pressure with gauze.   A bandage was placed.    The patient tolerated the procedure well.  Extensive instructions were given to the patient.   Complications: None  Take antibiotic as directed.        Assessment & Plan:   1. Tick bite, initial encounter No bulls eye rash or systemic features. Able to remove most of remnants of tick. Patient tolerated well. We decided more harm than  Good to attempt to get all of remnants. Patient will start oral antibiotic due to duration of tick being attached unknown and use warm compresses. He will let me know if not improving. Education provided on lyme disease.   - doxycycline (VIBRA-TABS) 100 MG tablet; Take 1 tablet (100 mg total) by mouth 2 (two) times daily.  Dispense: 20 tablet; Refill: 0    I am having Mr. Woodberry maintain his amLODipine, clopidogrel, cyclobenzaprine, gabapentin, glipiZIDE, metFORMIN,  atorvastatin, JARDIANCE, lisinopril-hydrochlorothiazide, mupirocin ointment, metoprolol succinate, and traMADol.   No orders of the defined types were placed in this encounter.   Return precautions given.   Risks, benefits, and alternatives of the medications and treatment plan prescribed today were discussed, and patient expressed understanding.   Education regarding symptom management and diagnosis given to patient on AVS.  Continue to follow with Marikay Alar, MD for routine health maintenance.   [name] and I agreed with plan.   Rennie Plowman, FNP

## 2016-06-13 NOTE — Patient Instructions (Signed)
Start doxycycline  Take probiotics while on antibiotic and for 2 weeks after  Let us know if not better   Tick Bite Information, Adult Ticks are insects that draw blood for food. Most ticks live in shrubs and grassy areas. They climb onto people and animals that brush against the leaves and grasses that they rest on. Then they bite, attaching themselves to the skin. Most ticks are harmless, but some ticks carry germs that can spread to a person through a bite and cause a disease. To reduce your risk of getting a disease from a tick bite, it is important to take steps to prevent tick bites. It is also important to check for ticks after being outdoors. If you find that a tick has attached to you, watch for symptoms of disease. How can I prevent tick bites? Take these steps to help prevent tick bites when you are outdoors in an area where ticks are found:  Use insect repellent that has DEET (20% or higher), picaridin, or IR3535 in it. Use it on:  Skin that is showing.  The top of your boots.  Your pant legs.  Your sleeve cuffs.  For repellent products that contain permethrin, follow product instructions. Use these products on:  Clothing.  Gear.  Boots.  Tents.  Wear protective clothing. Long sleeves and long pants offer the best protection from ticks.  Wear light-colored clothing so you can see ticks more easily.  Tuck your pant legs into your socks.  If you go walking on a trail, stay in the middle of the trail so your skin, hair, and clothing do not touch the bushes.  Avoid walking through areas with long grass.  Check for ticks on your clothing, hair, and skin often while you are outside, and check again before you go inside. Make sure to check the places that ticks attach themselves most often. These places include the scalp, neck, armpits, waist, groin, and joint areas. Ticks that carry a disease called Lyme disease have to be attached to the skin for 24-48 hours.  Checking for ticks every day will lessen your risk of this and other diseases.  When you come indoors, wash your clothes and take a shower or a bath right away. Dry your clothes in a dryer on high heat for at least 60 minutes. This will kill any ticks in your clothes. What is the proper way to remove a tick? If you find a tick on your body, remove it as soon as possible. Removing a tick sooner rather than later can prevent germs from passing from the tick to your body. To remove a tick that is crawling on your skin but has not bitten:  Go outdoors and brush the tick off.  Remove the tick with tape or a lint roller. To remove a tick that is attached to your skin:  Wash your hands.  If you have latex gloves, put them on.  Use tweezers, curved forceps, or a tick-removal tool to gently grasp the tick as close to your skin and the tick's head as possible.  Gently pull with steady, upward pressure until the tick lets go. When removing the tick:  Take care to keep the tick's head attached to its body.  Do not twist or jerk the tick. This can make the tick's head or mouth break off.  Do not squeeze or crush the tick's body. This could force disease-carrying fluids from the tick into your body. Do not try to remove a  tick with heat, alcohol, petroleum jelly, or fingernail polish. Using these methods can cause the tick to salivate and regurgitate into your bloodstream, increasing your risk of getting a disease. What should I do after removing a tick?  Clean the bite area with soap and water, rubbing alcohol, or an iodine scrub.  If an antiseptic cream or ointment is available, apply a small amount to the bite site.  Wash and disinfect any instruments that you used to remove the tick. How should I dispose of a tick? To dispose of a live tick, use one of these methods:  Place it in rubbing alcohol.  Place it in a sealed bag or container.  Wrap it tightly in tape.  Flush it down the  toilet. Contact a health care provider if:  You have symptoms of a disease after a tick bite. Symptoms of a tick-borne disease can occur from moments after the tick bites to up to 30 days after a tick is removed. Symptoms include:  Muscle, joint, or bone pain.  Difficulty walking or moving your legs.  Numbness in the legs.  Paralysis.  Red rash around the tick bite area that is shaped like a target or a "bull's-eye."  Redness and swelling in the area of the tick bite.  Fever.  Repeated vomiting.  Diarrhea.  Weight loss.  Tender, swollen lymph glands.  Shortness of breath.  Cough.  Pain in the abdomen.  Headache.  Abnormal tiredness.  A change in your level of consciousness.  Confusion. Get help right away if:  You are not able to remove a tick.  A part of a tick breaks off and gets stuck in your skin.  Your symptoms get worse. Summary  Ticks may carry germs that can spread to a person through a bite and cause disease.  Wear protective clothing and use insect repellent to prevent tick bites. Follow product instructions.  If you find a tick on your body, remove it as soon as possible. If the tick is attached, do not try to remove with heat, alcohol, petroleum jelly, or fingernail polish.  Remove the attached tick using tweezers, curved forceps, or a tick-removal tool. Gently pull with steady, upward pressure until the tick lets go. Do not twist or jerk the tick. Do not squeeze or crush the tick's body.  If you have symptoms after being bitten by a tick, contact a health care provider. This information is not intended to replace advice given to you by your health care provider. Make sure you discuss any questions you have with your health care provider. Document Released: 02/05/2000 Document Revised: 11/20/2015 Document Reviewed: 11/20/2015 Elsevier Interactive Patient Education  2017 [employer].

## 2016-06-13 NOTE — Progress Notes (Signed)
Pre visit review using our clinic review tool, if applicable. No additional management support is needed unless otherwise documented below in the visit note. 

## 2016-06-27 DIAGNOSIS — E1140 Type 2 diabetes mellitus with diabetic neuropathy, unspecified: Secondary | ICD-10-CM | POA: Diagnosis not present

## 2016-06-27 DIAGNOSIS — Z89422 Acquired absence of other left toe(s): Secondary | ICD-10-CM | POA: Diagnosis not present

## 2016-06-27 DIAGNOSIS — B351 Tinea unguium: Secondary | ICD-10-CM | POA: Diagnosis not present

## 2016-06-27 DIAGNOSIS — L851 Acquired keratosis [keratoderma] palmaris et plantaris: Secondary | ICD-10-CM | POA: Diagnosis not present

## 2016-07-07 ENCOUNTER — Ambulatory Visit: Payer: Medicare Other | Admitting: Family Medicine

## 2016-07-27 ENCOUNTER — Other Ambulatory Visit: Payer: Self-pay | Admitting: Family Medicine

## 2016-07-29 ENCOUNTER — Ambulatory Visit (INDEPENDENT_AMBULATORY_CARE_PROVIDER_SITE_OTHER): Payer: Medicare Other

## 2016-07-29 ENCOUNTER — Ambulatory Visit (INDEPENDENT_AMBULATORY_CARE_PROVIDER_SITE_OTHER): Payer: Medicare Other | Admitting: Vascular Surgery

## 2016-07-29 ENCOUNTER — Encounter (INDEPENDENT_AMBULATORY_CARE_PROVIDER_SITE_OTHER): Payer: Self-pay | Admitting: Vascular Surgery

## 2016-07-29 VITALS — BP 120/65 | HR 64 | Resp 16 | Ht 76.0 in | Wt 291.0 lb

## 2016-07-29 DIAGNOSIS — I70211 Atherosclerosis of native arteries of extremities with intermittent claudication, right leg: Secondary | ICD-10-CM | POA: Diagnosis not present

## 2016-07-29 DIAGNOSIS — E1151 Type 2 diabetes mellitus with diabetic peripheral angiopathy without gangrene: Secondary | ICD-10-CM

## 2016-07-29 DIAGNOSIS — I70219 Atherosclerosis of native arteries of extremities with intermittent claudication, unspecified extremity: Secondary | ICD-10-CM

## 2016-07-29 DIAGNOSIS — E785 Hyperlipidemia, unspecified: Secondary | ICD-10-CM

## 2016-07-29 DIAGNOSIS — IMO0002 Reserved for concepts with insufficient information to code with codable children: Secondary | ICD-10-CM

## 2016-07-29 DIAGNOSIS — E1165 Type 2 diabetes mellitus with hyperglycemia: Secondary | ICD-10-CM

## 2016-07-29 DIAGNOSIS — I10 Essential (primary) hypertension: Secondary | ICD-10-CM | POA: Diagnosis not present

## 2016-07-29 DIAGNOSIS — F17210 Nicotine dependence, cigarettes, uncomplicated: Secondary | ICD-10-CM | POA: Diagnosis not present

## 2016-07-29 DIAGNOSIS — F17200 Nicotine dependence, unspecified, uncomplicated: Secondary | ICD-10-CM

## 2016-07-29 NOTE — Assessment & Plan Note (Signed)
The patient's claudication symptoms are markedly improved after intervention last year. He currently has no lifestyle limiting claudication, ischemic rest pain, or ulceration. His noninvasive studies today demonstrate normal ABIs of 0.94 on the right and 1.08 on the left with good triphasic waveforms. He has some mildly elevated velocities in the left proximal SFA does not appear to be hemodynamically significant. His right distal SFA and popliteal artery stent is patent with mild dilatation of his popliteal artery below the stent to 1.1 cm in maximal diameter. He is doing well clinically. We again had a long discussion about smoking cessation. We will stretch out his follow-up and see him back in 6 months or sooner if problems develop in the interim.

## 2016-07-29 NOTE — Progress Notes (Signed)
MRN : [id number]  Joseph Singh is a 56 y.o. (May 19, 1960) male who presents with chief complaint of  Chief Complaint  Patient presents with  . Re-evaluation    3-4 month ABI  .  History of Present Illness: Patient returns today in follow up of PAD.  He is doing well.  He continues to smoke.  The patient's claudication symptoms are markedly improved after intervention last year. He currently has no lifestyle limiting claudication, ischemic rest pain, or ulceration. His noninvasive studies today demonstrate normal ABIs of 0.94 on the right and 1.08 on the left with good triphasic waveforms. He has some mildly elevated velocities in the left proximal SFA does not appear to be hemodynamically significant. His right distal SFA and popliteal artery stent is patent with mild dilatation of his popliteal artery below the stent to 1.1 cm in maximal diameter.        Current Outpatient Prescriptions  Medication Sig Dispense Refill  . amLODipine (NORVASC) 10 MG tablet Take 1 tablet (10 mg total) by mouth daily. 90 tablet 3  . atorvastatin (LIPITOR) 40 MG tablet Take 1 tablet (40 mg total) by mouth daily. 90 tablet 3  . clopidogrel (PLAVIX) 75 MG tablet TAKE ONE (1) TABLET BY MOUTH EVERY DAY 90 tablet 3  . cyclobenzaprine (FLEXERIL) 10 MG tablet Take 1 tablet (10 mg total) by mouth 3 (three) times daily as needed for muscle spasms. 30 tablet 2  . gabapentin (NEURONTIN) 300 MG capsule TAKE ONE (1) CAPSULE FOUR (4) TIMES EACH DAY 360 capsule 2  . glipiZIDE (GLUCOTROL XL) 10 MG 24 hr tablet Take 1 tablet (10 mg total) by mouth daily with breakfast. 90 tablet 3  . JARDIANCE 10 MG TABS tablet TAKE ONE (1) TABLET BY MOUTH EVERY DAY 90 mg 1  . lisinopril-hydrochlorothiazide (PRINZIDE,ZESTORETIC) 20-25 MG tablet Take 1 tablet by mouth daily. 90 tablet 3  . metFORMIN (GLUCOPHAGE) 1000 MG tablet TAKE 1 TABLET BY MOUTH TWICE DAILY WITH A MEAL 180 tablet 3  . traMADol (ULTRAM) 50 MG tablet TAKE ONE (1)  TABLET BY MOUTH 3 TIMES DAILY 180 tablet 0   No current facility-administered medications for this visit.         Past Medical History:  Diagnosis Date  . Diabetes mellitus without complication [name]   . Hyperlipidemia   . Hypertension   . Kidney stones          Past Surgical History:  Procedure Laterality Date  . TOE AMPUTATION      Social History      Social History  Substance Use Topics  . Smoking status: Current Every Day Smoker    Packs/day: 1.00    Types: Cigarettes  . Smokeless tobacco: Never Used  . Alcohol use No   No IV drug use  Family History      Family History  Problem Relation Age of Onset  . Heart disease Mother   . Diabetes Father   No bleeding or clotting disorders  No Known Allergies   REVIEW OF SYSTEMS(Negative unless checked)  Constitutional: [] Weight loss[] Fever[] Chills Cardiac:[] Chest pain[] Chest pressure[] Palpitations [] Shortness of breath when laying flat [] Shortness of breath at rest [x] Shortness of breath with exertion. Vascular: [x] Pain in legs with walking[] Pain in legsat rest[] Pain in legs when laying flat [x] Claudication [] Pain in feet when walking [] Pain in feet at rest [] Pain in feet when laying flat [] History of DVT [] Phlebitis [] Swelling in legs [] Varicose veins [] Non-healing ulcers Pulmonary: [] Uses home oxygen [] Productive cough[] Hemoptysis [] Wheeze [] COPD [] Asthma  Neurologic: [] Dizziness [] Blackouts [] Seizures [] History of stroke [] History of TIA[] Aphasia [] Temporary blindness[] Dysphagia [] Weaknessor numbness in arms [] Weakness or numbnessin legs Musculoskeletal: [] Arthritis [] Joint swelling [] Joint pain [] Low back pain Hematologic:[] Easy bruising[] Easy bleeding [] Hypercoagulable state [] Anemic  Gastrointestinal:[] Blood in stool[] Vomiting blood[] Gastroesophageal reflux/heartburn[] Abdominal  pain Genitourinary: [] Chronic kidney disease [] Difficulturination [] Frequenturination [] Burning with urination[] Hematuria Skin: [] Rashes [] Ulcers [] Wounds Psychological: [] History of anxiety[] History of major depression.    Physical Examination  BP 120/65 (BP Location: Right Arm)   Pulse 64   Resp 16   Ht 6\' 4"  (1.93 m)   Wt 291 lb (132 kg)   BMI 35.42 kg/m  Gen:  WD/WN, NAD Head: [place]/AT, No temporalis wasting. Ear/Nose/Throat: Hearing grossly intact, nares w/o erythema or drainage, trachea midline Eyes: Conjunctiva clear. Sclera non-icteric Neck: Supple.  No JVD.  Pulmonary:  Good air movement, no use of accessory muscles.  Cardiac: RRR, normal S1, S2 Vascular:  Vessel Right Left  Radial Palpable Palpable  Ulnar Palpable Palpable  Brachial Palpable Palpable  Carotid Palpable, without bruit Palpable, without bruit  Aorta Not palpable N/A  Femoral Palpable Palpable  Popliteal Enlarged, Palpable Palpable  PT Palpable Palpable  DP Palpable Palpable   Gastrointestinal: soft, non-tender/non-distended. Musculoskeletal: M/S 5/5 throughout.  No deformity or atrophy.  Neurologic: Sensation grossly intact in extremities.  Symmetrical.  Speech is fluent.  Psychiatric: Judgment intact, Mood & affect appropriate for pt's clinical situation. Dermatologic: No rashes or ulcers noted.  No cellulitis or open wounds.       Labs No results found for this or any previous visit (from the past 2160 hour(s)).  Radiology No results found.    Assessment/Plan Hyperlipidemia lipid control important in reducing the progression of atherosclerotic disease. Continue statin therapy   Essential hypertension blood pressure control important in reducing the progression of atherosclerotic disease. On appropriate oral medications.   DM (diabetes mellitus) type II uncontrolled, periph vascular disorder blood glucose control important in reducing the progression of  atherosclerotic disease. Also, involved in wound healing. On appropriate medications.   Tobacco use disorder We had a discussion for approximately 3-504minutes regarding the absolute need for smoking cessation due to the deleterious nature of tobacco on the vascular system. We discussed the tobacco use would diminish patency of any intervention, and likely significantly worsen progressio of disease. We discussed multiple agents for quitting including replacement therapy or medications to reduce cravings such as Chantix. The patient voices their understanding of the importance of smoking cessation.  Atherosclerosis of native arteries of extremity with intermittent claudication [name] The patient's claudication symptoms are markedly improved after intervention last year. He currently has no lifestyle limiting claudication, ischemic rest pain, or ulceration. His noninvasive studies today demonstrate normal ABIs of 0.94 on the right and 1.08 on the left with good triphasic waveforms. He has some mildly elevated velocities in the left proximal SFA does not appear to be hemodynamically significant. His right distal SFA and popliteal artery stent is patent with mild dilatation of his popliteal artery below the stent to 1.1 cm in maximal diameter. He is doing well clinically. We again had a long discussion about smoking cessation. We will stretch out his follow-up and see him back in 6 months or sooner if problems develop in the interim.    Festus BarrenJason Jarissa Sheriff, MD  07/29/2016 3:58 PM    This note was created with Dragon medical transcription system.  Any errors from dictation are purely unintentional

## 2016-08-15 ENCOUNTER — Encounter: Payer: Self-pay | Admitting: Family Medicine

## 2016-08-15 ENCOUNTER — Ambulatory Visit (INDEPENDENT_AMBULATORY_CARE_PROVIDER_SITE_OTHER): Payer: Medicare Other | Admitting: Family Medicine

## 2016-08-15 VITALS — BP 114/68 | HR 64 | Temp 98.1°F | Wt 290.8 lb

## 2016-08-15 DIAGNOSIS — I10 Essential (primary) hypertension: Secondary | ICD-10-CM

## 2016-08-15 DIAGNOSIS — M5442 Lumbago with sciatica, left side: Secondary | ICD-10-CM

## 2016-08-15 DIAGNOSIS — E1151 Type 2 diabetes mellitus with diabetic peripheral angiopathy without gangrene: Secondary | ICD-10-CM

## 2016-08-15 DIAGNOSIS — G8929 Other chronic pain: Secondary | ICD-10-CM

## 2016-08-15 DIAGNOSIS — E1165 Type 2 diabetes mellitus with hyperglycemia: Secondary | ICD-10-CM

## 2016-08-15 DIAGNOSIS — IMO0002 Reserved for concepts with insufficient information to code with codable children: Secondary | ICD-10-CM

## 2016-08-15 MED ORDER — GABAPENTIN 300 MG PO CAPS: ORAL_CAPSULE | ORAL | 0 refills | Status: DC

## 2016-08-15 MED ORDER — LISINOPRIL-HYDROCHLOROTHIAZIDE 20-25 MG PO TABS: 0.5000 | ORAL_TABLET | Freq: Every day | ORAL | 3 refills | Status: DC

## 2016-08-15 MED ORDER — EMPAGLIFLOZIN 10 MG PO TABS: ORAL_TABLET | ORAL | 1 refills | Status: DC

## 2016-08-15 MED ORDER — GLIPIZIDE ER 10 MG PO TB24: 10.0000 mg | ORAL_TABLET | Freq: Every day | ORAL | 3 refills | Status: DC

## 2016-08-15 MED ORDER — AMLODIPINE BESYLATE 10 MG PO TABS
10.0000 mg | ORAL_TABLET | Freq: Every day | ORAL | 3 refills | Status: DC
Start: 2016-08-15 — End: 2017-11-22

## 2016-08-15 MED ORDER — PREDNISONE 10 MG PO TABS: ORAL_TABLET | ORAL | 0 refills | Status: DC

## 2016-08-15 MED ORDER — METFORMIN HCL 1000 MG PO TABS: ORAL_TABLET | ORAL | 3 refills | Status: DC

## 2016-08-15 MED ORDER — CLOPIDOGREL BISULFATE 75 MG PO TABS: ORAL_TABLET | ORAL | 3 refills | Status: DC

## 2016-08-15 MED ORDER — CYCLOBENZAPRINE HCL 10 MG PO TABS: 10.0000 mg | ORAL_TABLET | Freq: Three times a day (TID) | ORAL | 2 refills | Status: DC | PRN

## 2016-08-15 MED ORDER — ATORVASTATIN CALCIUM 40 MG PO TABS: 40.0000 mg | ORAL_TABLET | Freq: Every day | ORAL | 3 refills | Status: DC

## 2016-08-15 NOTE — Addendum Note (Signed)
Addended by: [name], Raena Pau G on: 08/15/2016 07:19 PM   Modules accepted: Orders

## 2016-08-15 NOTE — Assessment & Plan Note (Signed)
Patient with back pain flare. Neurologically intact. No red flags. We will treat with a prednisone taper given his sciatica-like symptoms. He'll continue as needed tramadol and Flexeril. Advised to monitor his blood sugars. Discussed potential for sleep difficulty and appetite increase with prednisone. Given return precautions in AVS.

## 2016-08-15 NOTE — Progress Notes (Signed)
  [name] Rumps, MD Phone: [phone]  Joseph Singh is a 56 y.o. male who presents today for follow-up.  HYPERTENSION  Disease Monitoring  Home BP Monitoring 130/70 Chest pain- no    Dyspnea- no Medications  Compliance-  Taking medication. Edema- yes, minimal, goes down at night, no orthopnea  Diabetes: Notes his sugars been running a though he cannot give me the numbers. Taking glipizide, Jardiance, metformin. No polyuria or polydipsia. No hypoglycemia.  Patient notes he is having a low back pain flare. Has chronic low back pain though occasionally will flare up. Notes hurts more if he turns to his right. Does have left-sided sciatica. No numbness, weakness, loss of bowel or bladder function, or saddle anesthesia. Does take tramadol. Occasionally takes Flexeril.  PMH: smoker   ROS see history of present illness  Objective  Physical Exam Vitals:   08/15/16 1607  BP: 114/68  Pulse: 64  Temp: 98.1 F (36.7 C)    BP Readings from Last 3 Encounters:  08/15/16 114/68  07/29/16 120/65  06/13/16 (!) 144/70   Wt Readings from Last 3 Encounters:  08/15/16 290 lb 12.8 oz (131.9 kg)  07/29/16 291 lb (132 kg)  06/13/16 292 lb 12.8 oz (132.8 kg)    Physical Exam  Constitutional: No distress.  Cardiovascular: Normal rate, regular rhythm and normal heart sounds.   Pulmonary/Chest: Effort normal and breath sounds normal.  Musculoskeletal: He exhibits no edema.  No midline spine tenderness, no midline spine step-off, there is back mouse in the left sacroiliac region that is mildly tender, there is no overlying skin changes or fluctuance  Neurological: He is alert. Gait normal.  5/5 strength bilateral quads, hamstrings, plantar flexion, and dorsiflexion, sensation light touch intact bilaterally lower extremities  Skin: He is not diaphoretic.     Assessment/Plan: Please see individual problem list.  DM (diabetes mellitus) type II uncontrolled, periph vascular  disorder Check A1c. Continue current medications.  Essential hypertension At goal. Check CMP. Continue current medications.  Back pain Patient with back pain flare. Neurologically intact. No red flags. We will treat with a prednisone taper given his sciatica-like symptoms. He'll continue as needed tramadol and Flexeril. Advised to monitor his blood sugars. Discussed potential for sleep difficulty and appetite increase with prednisone. Given return precautions in AVS.   Orders Placed This Encounter  Procedures  . Comp Met (CMET)  . HgB A1c    Meds ordered this encounter  Medications  . predniSONE (DELTASONE) 10 MG tablet    Sig: Take 50 mg (5 tablets) by mouth today, then decrease by 1 tablet daily until gone    Dispense:  15 tablet    Refill:  0  . cyclobenzaprine (FLEXERIL) 10 MG tablet    Sig: Take 1 tablet (10 mg total) by mouth 3 (three) times daily as needed for muscle spasms.    Dispense:  30 tablet    Refill:  2    [name] Rumps, MD [place]

## 2016-08-15 NOTE — Assessment & Plan Note (Signed)
Check A1c. Continue current medications. 

## 2016-08-15 NOTE — Patient Instructions (Signed)
Nice to see you. We are going to treat your back pain with a prednisone taper. You can also take the Flexeril. We'll get some lab work today and contact you with results. If you develop numbness, weakness, loss of bowel or bladder function, numbness between your legs, or any new or changing symptoms please seek medical attention immediately.

## 2016-08-15 NOTE — Assessment & Plan Note (Signed)
At goal. Check CMP. Continue current medications. 

## 2016-08-16 LAB — COMPREHENSIVE METABOLIC PANEL
ALBUMIN: 4.1 g/dL (ref 3.5–5.2)
ALT: 20 U/L (ref 0–53)
AST: 15 U/L (ref 0–37)
Alkaline Phosphatase: 79 U/L (ref 39–117)
BILIRUBIN TOTAL: 0.5 mg/dL (ref 0.2–1.2)
BUN: 14 mg/dL (ref 6–23)
CALCIUM: 9.7 mg/dL (ref 8.4–10.5)
CO2: 30 mEq/L (ref 19–32)
CREATININE: 1.12 mg/dL (ref 0.40–1.50)
Chloride: 99 mEq/L (ref 96–112)
GFR: 71.7 mL/min (ref >60.00)
Glucose, Bld: 85 mg/dL (ref 70–99)
Potassium: 4 mEq/L (ref 3.5–5.1)
Sodium: 137 mEq/L (ref 135–145)
Total Protein: 7 g/dL (ref 6.0–8.3)

## 2016-08-16 LAB — HEMOGLOBIN A1C: HEMOGLOBIN A1C: 7.6 % — AB (ref 4.6–6.5)

## 2016-08-23 ENCOUNTER — Ambulatory Visit: Payer: Medicare Other | Admitting: Family Medicine

## 2016-08-23 ENCOUNTER — Other Ambulatory Visit: Payer: Self-pay | Admitting: Family Medicine

## 2016-08-23 NOTE — Telephone Encounter (Signed)
faxed

## 2016-08-23 NOTE — Telephone Encounter (Signed)
Last OV 08/15/16 last filled 04/29/16 180 1rf

## 2016-08-26 ENCOUNTER — Encounter: Payer: Self-pay | Admitting: *Deleted

## 2016-08-29 ENCOUNTER — Other Ambulatory Visit: Payer: Self-pay | Admitting: Family Medicine

## 2016-08-29 DIAGNOSIS — E1151 Type 2 diabetes mellitus with diabetic peripheral angiopathy without gangrene: Secondary | ICD-10-CM

## 2016-08-29 MED ORDER — EMPAGLIFLOZIN 25 MG PO TABS: 25.0000 mg | ORAL_TABLET | Freq: Every day | ORAL | 1 refills | Status: DC

## 2016-09-27 ENCOUNTER — Other Ambulatory Visit: Payer: Medicare Other

## 2016-09-30 ENCOUNTER — Other Ambulatory Visit (INDEPENDENT_AMBULATORY_CARE_PROVIDER_SITE_OTHER): Payer: Medicare Other

## 2016-09-30 DIAGNOSIS — E1151 Type 2 diabetes mellitus with diabetic peripheral angiopathy without gangrene: Secondary | ICD-10-CM

## 2016-09-30 NOTE — Addendum Note (Signed)
Addended by: Penne LashWIGGINS, Amarachukwu Lakatos N on: 09/30/2016 10:41 AM   Modules accepted: Orders

## 2016-10-01 LAB — BASIC METABOLIC PANEL
BUN: 17 mg/dL (ref 7–25)
CHLORIDE: 100 mmol/L (ref 98–110)
CO2: 23 mmol/L (ref 20–32)
Calcium: 9.5 mg/dL (ref 8.6–10.3)
Creat: 1.15 mg/dL (ref 0.70–1.33)
GLUCOSE: 171 mg/dL — AB (ref 65–99)
Potassium: 4.2 mmol/L (ref 3.5–5.3)
Sodium: 136 mmol/L (ref 135–146)

## 2016-10-26 DIAGNOSIS — L851 Acquired keratosis [keratoderma] palmaris et plantaris: Secondary | ICD-10-CM | POA: Diagnosis not present

## 2016-10-26 DIAGNOSIS — E1140 Type 2 diabetes mellitus with diabetic neuropathy, unspecified: Secondary | ICD-10-CM | POA: Diagnosis not present

## 2016-10-26 DIAGNOSIS — B351 Tinea unguium: Secondary | ICD-10-CM | POA: Diagnosis not present

## 2016-12-22 ENCOUNTER — Other Ambulatory Visit: Payer: Self-pay | Admitting: Family Medicine

## 2016-12-28 ENCOUNTER — Other Ambulatory Visit: Payer: Self-pay | Admitting: Family Medicine

## 2016-12-28 NOTE — Telephone Encounter (Signed)
Last fill 08/23/16 180 with one refill last OV in 6/18 ok to fill?

## 2016-12-28 NOTE — Telephone Encounter (Signed)
Please fax

## 2016-12-29 ENCOUNTER — Other Ambulatory Visit: Payer: Self-pay | Admitting: Family Medicine

## 2016-12-29 NOTE — Telephone Encounter (Signed)
Faxed script

## 2017-01-09 NOTE — Telephone Encounter (Addendum)
Duplicate request ,see refill request 12/28/16

## 2017-01-19 ENCOUNTER — Ambulatory Visit (INDEPENDENT_AMBULATORY_CARE_PROVIDER_SITE_OTHER): Payer: Medicare Other

## 2017-01-19 VITALS — BP 126/76 | HR 75 | Temp 98.3°F | Resp 17 | Ht 76.0 in | Wt 301.0 lb

## 2017-01-19 DIAGNOSIS — Z0000 Encounter for general adult medical examination without abnormal findings: Secondary | ICD-10-CM

## 2017-01-19 DIAGNOSIS — Z1331 Encounter for screening for depression: Secondary | ICD-10-CM

## 2017-01-19 NOTE — Patient Instructions (Addendum)
  Mr. Joseph Singh , Thank you for taking time to come for your Medicare Wellness Visit. I appreciate your ongoing commitment to your health goals. Please review the following plan we discussed and let me know if I can assist you in the future.   CALL YOUR INSURANCE TO SEE WHO IS COVERED IN YOUR NETWORK.  Follow up with Dr. Birdie Singh as needed.    Bring a copy of your Health Care Power of Attorney and/or Living Will to be scanned into chart once completed.  Have a great day!  These are the goals we discussed: Goals    . HEALTH MAINTENANCE      CHECK WITH HEALTH INSURANCE FOR IN-NETWORK SERVICES: DENTAL EYE        This is a list of the screening recommended for you and due dates:  Health Maintenance  Topic Date Due  . Pneumococcal vaccine (1) 02/12/1961  . Eye exam for diabetics  02/12/1969  . Tetanus Vaccine  02/12/1978  . Complete foot exam   10/29/2016  . Flu Shot  01/31/2017*  . Hemoglobin A1C  02/14/2017  . Colon Cancer Screening  02/13/2019  .  Hepatitis C: One time screening is recommended by Center for Disease Control  (CDC) for  adults born from [birth year] through 1967.   Completed  . HIV Screening  Completed  *Topic was postponed. The date shown is not the original due date.

## 2017-01-19 NOTE — Progress Notes (Signed)
Subjective:   Joseph Singh is a 56 y.o. male who presents for an Initial Medicare Annual Wellness Visit.  Review of Systems  No ROS.  Medicare Wellness Visit. Additional risk factors are reflected in the social history.  Cardiac Risk Factors include: advanced age (>5255men, 3>65 women);hypertension;diabetes mellitus;obesity (BMI >30kg/m2)    Objective:    Today's Vitals   01/19/17 0900  BP: 126/76  Pulse: 75  Resp: 17  Temp: 98.3 F (36.8 C)  TempSrc: Oral  SpO2: 96%  Weight: (!) 301 lb (136.5 kg)  Height: 6\' 4"  (1.93 m)   Body mass index is 36.64 kg/m.  Current Medications (verified) Outpatient Encounter Medications as of 01/19/2017  Medication Sig  . amLODipine (NORVASC) 10 MG tablet Take 1 tablet (10 mg total) by mouth daily.  Marland Kitchen. atorvastatin (LIPITOR) 40 MG tablet Take 1 tablet (40 mg total) by mouth daily.  . clopidogrel (PLAVIX) 75 MG tablet TAKE ONE (1) TABLET BY MOUTH EVERY DAY  . cyclobenzaprine (FLEXERIL) 10 MG tablet Take 1 tablet (10 mg total) by mouth 3 (three) times daily as needed for muscle spasms.  . empagliflozin (JARDIANCE) 25 MG TABS tablet Take 25 mg by mouth daily.  Marland Kitchen. gabapentin (NEURONTIN) 300 MG capsule TAKE 1 CAPSULE FOUR TIMES DAILY  . glipiZIDE (GLUCOTROL XL) 10 MG 24 hr tablet Take 1 tablet (10 mg total) by mouth daily with breakfast.  . lisinopril-hydrochlorothiazide (PRINZIDE,ZESTORETIC) 20-25 MG tablet Take 0.5 tablets by mouth daily.  . metFORMIN (GLUCOPHAGE) 1000 MG tablet TAKE 1 TABLET BY MOUTH TWICE DAILY WITH A MEAL  . metoprolol succinate (TOPROL-XL) 50 MG 24 hr tablet Take 50 mg by mouth 2 (two) times daily. Take with or immediately following a meal.   . traMADol (ULTRAM) 50 MG tablet TAKE ONE TABLET BY MOUTH 3 TIMES DAILY  . mupirocin ointment (BACTROBAN) 2 % Place 1 application into the nose 2 (two) times daily. For 7 days. Started 02/16/16  . predniSONE (DELTASONE) 10 MG tablet Take 50 mg (5 tablets) by mouth today, then decrease by  1 tablet daily until gone   No facility-administered encounter medications on file as of 01/19/2017.     Allergies (verified) Patient has no known allergies.   History: Past Medical History:  Diagnosis Date  . Arthritis   . Diabetes mellitus without complication [name]   . History of kidney stones   . Hyperlipidemia   . Hypertension   . Kidney stones   . Neuropathy    Past Surgical History:  Procedure Laterality Date  . LOWER EXTREMITY ANGIOGRAPHY Right 02/25/2016   Procedure: Lower Extremity Angiography;  Surgeon: Annice NeedyJason S Dew, MD;  Location: [facility] INVASIVE CV LAB;  Service: Cardiovascular;  Laterality: Right;  . LOWER EXTREMITY INTERVENTION  02/25/2016   Procedure: Lower Extremity Intervention;  Surgeon: Annice NeedyJason S Dew, MD;  Location: [facility] INVASIVE CV LAB;  Service: Cardiovascular;;  . TOE AMPUTATION     Family History  Problem Relation Age of Onset  . Diabetes Mother   . Heart disease Father   . Diabetes Sister   . Diabetes Brother   . Cancer Sister        lung  . Cancer Sister        breast   Social History   Occupational History  . Not on file  Tobacco Use  . Smoking status: Current Every Day Smoker    Packs/day: 1.00    Years: 35.00    Pack years: 35.00    Types: Cigarettes  .  Smokeless tobacco: Never Used  Substance and Sexual Activity  . Alcohol use: No    Alcohol/week: 0.0 oz  . Drug use: No  . Sexual activity: Yes    Partners: Female    Comment: Wife   Tobacco Counseling Ready to quit: No Counseling given: Not Answered   Activities of Daily Living In your present state of health, do you have any difficulty performing the following activities: 01/19/2017  Hearing? N  Vision? N  Difficulty concentrating or making decisions? N  Walking or climbing stairs? Y  Dressing or bathing? N  Doing errands, shopping? N  Preparing Food and eating ? N  Using the Toilet? N  In the past six months, have you accidently leaked urine? N  Do you have problems with  loss of bowel control? N  Managing your Medications? N  Managing your Finances? Y  Comment Wife manages the finances  Housekeeping or managing your Housekeeping? Y  Comment Difficulty standing for long periods  Some recent data might be hidden    Immunizations and Health Maintenance  There is no immunization history on file for this patient. Health Maintenance Due  Topic Date Due  . PNEUMOCOCCAL POLYSACCHARIDE VACCINE (1) 02/12/1961  . OPHTHALMOLOGY EXAM  02/12/1969  . TETANUS/TDAP  02/12/1978  . FOOT EXAM  10/29/2016    Patient Care Team: [name], [name], MD as PCP - General (Family Medicine)  Indicate any recent Medical Services you may have received from other than [facility] providers in the past year (date may be approximate).    Assessment:   This is a routine wellness examination for Joseph Singh. The goal of the wellness visit is to assist the patient how to close the gaps in care and create a preventative care plan for the patient.   The roster of all physicians providing medical care to patient is listed in the Snapshot section of the chart.  Osteoporosis risk reviewed.    Safety issues reviewed; Smoke and carbon monoxide detectors in the home.Firearms locked up in the home. Wears seatbelts when driving or riding with others. Patient does wear sunscreen or protective clothing when in direct sunlight. No violence in the home.  Depression- PHQ 2 &9 complete.  No signs/symptoms or verbal communication regarding little pleasure in doing things, feeling down, depressed or hopeless. No changes in sleeping, energy, eating, concentrating.  No thoughts of self harm or harm towards others.  Time spent on this topic is 8 minutes.   Patient is alert, normal appearance, oriented to person/place/and time. Correctly identified the president of the [place], recall of 3/3 words, and performing simple calculations. Displays appropriate judgement and can read correct time from watch face.   No  new identified risk were noted.  No failures at ADL's or IADL's.    BMI- discussed the importance of a healthy diet, water intake and the benefits of aerobic exercise. Educational material provided.   24 hour diet recall: Breakfast: Sausage Biscuit Lunch: Peanut butter sandwich Dinner: Baked potato, baked pork chop Encouraged low carb foods  Daily fluid intake: 3 cups of caffeine, 6 cups of water  Diabetes followed by PCP.   Vascular and Vein followed by Festus BarrenJason Dew, MD.  Eye exam- screening deferred today per patient preference, wears corrective lenses when reading.  He does not have diabetic eye exams, encouraged this.  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  Foot exam completed every 3 months; Podiatrist Sofie RowerMatthew Gray Troxler.  See care everywhere.  Dental- he does not have regular follow  up, encouraged this.  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   Sleep patterns- Sleeps 7-8 hours at night.  Wakes feeling rested.  Patient Concerns: None at this time. Follow up with PCP as needed.  Hearing/Vision screen Hearing Screening Comments: Patient is able to hear conversational tones without difficulty.  No issues reported.   Vision Screening Comments: Diabetic eye exam encouraged.   Dietary issues and exercise activities discussed: Current Exercise Habits: Home exercise routine, Type of exercise: walking, Time (Minutes): 30, Frequency (Times/Week): 3, Weekly Exercise (Minutes/Week): 90, Intensity: Moderate  Goals    . HEALTH MAINTENANCE      CHECK WITH HEALTH INSURANCE FOR IN-NETWORK SERVICES: DENTAL EYE       Depression Screen PHQ 2/9 Scores 01/19/2017 06/13/2016 02/01/2016  PHQ - 2 Score 0 0 0  PHQ- 9 Score 0 - -    Fall Risk Fall Risk  01/19/2017 06/13/2016 02/01/2016  Falls in the past year? No No No    Cognitive Function: MMSE - Mini Mental State Exam 01/19/2017  Orientation to time 5  Orientation to Place 5  Registration 3  Attention/ Calculation 5    Recall 3  Language- name 2 objects 2  Language- repeat 1  Language- follow 3 step command 3  Language- read & follow direction 1  Write a sentence 1  Copy design 1  Total score 30        Screening Tests Health Maintenance  Topic Date Due  . PNEUMOCOCCAL POLYSACCHARIDE VACCINE (1) 02/12/1961  . OPHTHALMOLOGY EXAM  02/12/1969  . TETANUS/TDAP  02/12/1978  . FOOT EXAM  10/29/2016  . INFLUENZA VACCINE  01/31/2017 (Originally 09/21/2016)  . HEMOGLOBIN A1C  02/14/2017  . COLONOSCOPY  02/13/2019  . Hepatitis C Screening  Completed  . HIV Screening  Completed        Plan:   End of life planning; Advanced aging; Advanced directives discussed.  No HCPOA/Living Will.  Additional information provided to help them start the conversation with family.  Copy of HCPOA/Living Will requested upon completion. Time spent on this topic is 25 minutes.  I have personally reviewed and noted the following in the patient's chart:   . Medical and social history . Use of alcohol, tobacco or illicit drugs  . Current medications and supplements . Functional ability and status . Nutritional status . Physical activity . Advanced directives . List of other physicians . Hospitalizations, surgeries, and ER visits in previous 12 months . Vitals . Screenings to include cognitive, depression, and falls . Referrals and appointments  In addition, I have reviewed and discussed with patient certain preventive protocols, quality metrics, and best practice recommendations. A written personalized care plan for preventive services as well as general preventive health recommendations were provided to patient.     Ashok Pall, LPN   16/11/9602

## 2017-01-21 NOTE — Progress Notes (Signed)
I have reviewed the above note and agree.  Aaidyn San, M.D.  

## 2017-01-27 ENCOUNTER — Other Ambulatory Visit: Payer: Self-pay | Admitting: Family Medicine

## 2017-01-27 NOTE — Telephone Encounter (Signed)
Left message to return call to ask patient if he still takes this medication

## 2017-01-31 ENCOUNTER — Encounter (INDEPENDENT_AMBULATORY_CARE_PROVIDER_SITE_OTHER): Payer: Medicare Other

## 2017-01-31 ENCOUNTER — Ambulatory Visit (INDEPENDENT_AMBULATORY_CARE_PROVIDER_SITE_OTHER): Payer: Medicare Other | Admitting: Vascular Surgery

## 2017-02-01 ENCOUNTER — Other Ambulatory Visit: Payer: Self-pay

## 2017-02-01 ENCOUNTER — Encounter: Payer: Self-pay | Admitting: Family Medicine

## 2017-02-01 ENCOUNTER — Ambulatory Visit (INDEPENDENT_AMBULATORY_CARE_PROVIDER_SITE_OTHER): Payer: Medicare Other | Admitting: Family Medicine

## 2017-02-01 VITALS — BP 122/72 | HR 70 | Temp 98.1°F | Wt 302.2 lb

## 2017-02-01 DIAGNOSIS — L84 Corns and callosities: Secondary | ICD-10-CM | POA: Diagnosis not present

## 2017-02-01 DIAGNOSIS — M5442 Lumbago with sciatica, left side: Secondary | ICD-10-CM | POA: Diagnosis not present

## 2017-02-01 DIAGNOSIS — E1151 Type 2 diabetes mellitus with diabetic peripheral angiopathy without gangrene: Secondary | ICD-10-CM | POA: Diagnosis not present

## 2017-02-01 DIAGNOSIS — E1165 Type 2 diabetes mellitus with hyperglycemia: Secondary | ICD-10-CM

## 2017-02-01 DIAGNOSIS — I10 Essential (primary) hypertension: Secondary | ICD-10-CM | POA: Diagnosis not present

## 2017-02-01 DIAGNOSIS — I739 Peripheral vascular disease, unspecified: Secondary | ICD-10-CM | POA: Diagnosis not present

## 2017-02-01 DIAGNOSIS — G8929 Other chronic pain: Secondary | ICD-10-CM

## 2017-02-01 DIAGNOSIS — IMO0002 Reserved for concepts with insufficient information to code with codable children: Secondary | ICD-10-CM

## 2017-02-01 NOTE — Assessment & Plan Note (Signed)
Patient will see podiatry next week.

## 2017-02-01 NOTE — Assessment & Plan Note (Signed)
Return for fasting labs.  Continue current regimen.  Refer to ophthalmology.

## 2017-02-01 NOTE — Telephone Encounter (Signed)
Has an appt with Dr. Birdie SonsSonnenberg today 02/01/17.   Will address during this visit for refill.

## 2017-02-01 NOTE — Assessment & Plan Note (Signed)
Stable  Continue current regimen  

## 2017-02-01 NOTE — Progress Notes (Signed)
[name] Rumps, MD Phone: [phone]  Joseph Singh is a 56 y.o. male who presents today for f/u.  HYPERTENSION  Disease Monitoring  Home BP Monitoring similar Chest pain- no    Dyspnea- no Medications  Compliance-  Taking lisinopril, HCTZ, amlodipine, metoprolol.  Edema- no  DIABETES Disease Monitoring: Blood Sugar ranges-not checking Polyuria/phagia/dipsia- no      Visual problems- no, needs to see optho Medications: Compliance- taking jardiance, glipizide, metformin Hypoglycemic symptoms- no  Chronic back pain: Patient notes this is stable.  Has numbness related to neuropathy though no new numbness.  No weakness.  No bowel or bladder issues.  Takes tramadol which is beneficial.  He does not make him drowsy.  Rarely takes Flexeril.  Peripheral vascular disease: Patient notes he has had some claudication symptoms in his right leg particularly in the calf.  Only occurs with walking.  Improves if he rests.  He is due to see vascular surgery next week.    Social History   Tobacco Use  Smoking Status Current Every Day Smoker  . Packs/day: 1.00  . Years: 35.00  . Pack years: 35.00  . Types: Cigarettes  Smokeless Tobacco Never Used     ROS see history of present illness  Objective  Physical Exam Vitals:   02/01/17 1451  BP: 122/72  Pulse: 70  Temp: 98.1 F (36.7 C)  SpO2: 97%    BP Readings from Last 3 Encounters:  02/01/17 122/72  01/19/17 126/76  08/15/16 114/68   Wt Readings from Last 3 Encounters:  02/01/17 (!) 302 lb 3.2 oz (137.1 kg)  01/19/17 (!) 301 lb (136.5 kg)  08/15/16 290 lb 12.8 oz (131.9 kg)    Physical Exam  Constitutional: No distress.  Cardiovascular: Normal rate, regular rhythm and normal heart sounds.  Pulmonary/Chest: Effort normal and breath sounds normal.  Musculoskeletal: He exhibits no edema.  Neurological: He is alert. Gait normal.  Skin: Skin is warm and dry. He is not diaphoretic.   Diabetic Foot Exam - Simple   Simple  Foot Form Diabetic Foot exam was performed with the following findings:  Yes 02/01/2017  3:33 PM  Visual Inspection See comments:  Yes Sensation Testing See comments:  Yes Pulse Check See comments:  Yes Comments Callus noted left lateral ball of foot, flaking noted, thickened toenails bilaterally, no ulcerations noted decreased light touch and monofilament sensation bilaterally, 2+ left PT pulse, diminished right PT pulse and bilateral DP pulses, feet are warm      Assessment/Plan: Please see individual problem list.  Back pain Stable.  Continue current regimen.  Callus of foot Patient will see podiatry next week.  Peripheral vascular disease ([place]) Patient likely having claudication symptoms.  They do improve with rest.  No ulcerations or ischemia noted.  He will see vascular surgery next week.  Essential hypertension Well-controlled.  Continue current regimen.  He will return for fasting labs.  DM (diabetes mellitus) type II uncontrolled, periph vascular disorder Return for fasting labs.  Continue current regimen.  Refer to ophthalmology.   Carlos was seen today for follow-up.  Diagnoses and all orders for this visit:  DM (diabetes mellitus) type II uncontrolled, periph vascular disorder [name] -     HgB A1c; Future -     Lipid panel; Future -     Urine Microalbumin w/creat. ratio; Future -     Ambulatory referral to Ophthalmology  Essential hypertension -     Comp Met (CMET); Future  Peripheral vascular disease ([place])  Chronic left-sided low back pain with left-sided sciatica  Callus of foot    Orders Placed This Encounter  Procedures  . HgB A1c    Standing Status:   Future    Standing Expiration Date:   02/01/2018  . Lipid panel    Standing Status:   Future    Standing Expiration Date:   02/01/2018  . Urine Microalbumin w/creat. ratio    Standing Status:   Future    Standing Expiration Date:   02/01/2018  . Comp Met (CMET)    Standing Status:   Future     Standing Expiration Date:   02/01/2018  . Ambulatory referral to Ophthalmology    Referral Priority:   Routine    Referral Type:   Consultation    Referral Reason:   Specialty Services Required    Requested Specialty:   Ophthalmology    Number of Visits Requested:   1    No orders of the defined types were placed in this encounter.    [name] Rumps, MD [place]

## 2017-02-01 NOTE — Assessment & Plan Note (Signed)
Well-controlled.  Continue current regimen.  He will return for fasting labs.

## 2017-02-01 NOTE — Assessment & Plan Note (Signed)
Patient likely having claudication symptoms.  They do improve with rest.  No ulcerations or ischemia noted.  He will see vascular surgery next week.

## 2017-02-01 NOTE — Patient Instructions (Signed)
Nice to see you. Please see vascular surgery next week. We will try to get you set up with an eye doctor. Please set up an appointment for fasting lab work.

## 2017-02-02 ENCOUNTER — Other Ambulatory Visit: Payer: Self-pay | Admitting: Family Medicine

## 2017-02-02 NOTE — Telephone Encounter (Signed)
Relation to ZO:XWRUpt:self Call back number:[phone] [id number] [address] [place], [place] - [address] [phone] (Phone) [phone])     Reason for call:  Patient was seen 02/01/17 by PCP and states he contacted pharmacy and they denied receiving metoprolol succinate (TOPROL-XL) 50 MG 24 hr tablet refill request, please advise

## 2017-02-06 ENCOUNTER — Encounter (INDEPENDENT_AMBULATORY_CARE_PROVIDER_SITE_OTHER): Payer: Self-pay | Admitting: Vascular Surgery

## 2017-02-06 ENCOUNTER — Other Ambulatory Visit (INDEPENDENT_AMBULATORY_CARE_PROVIDER_SITE_OTHER): Payer: Self-pay | Admitting: Vascular Surgery

## 2017-02-06 ENCOUNTER — Ambulatory Visit (INDEPENDENT_AMBULATORY_CARE_PROVIDER_SITE_OTHER): Payer: Medicare Other

## 2017-02-06 ENCOUNTER — Ambulatory Visit (INDEPENDENT_AMBULATORY_CARE_PROVIDER_SITE_OTHER): Payer: Medicare Other | Admitting: Vascular Surgery

## 2017-02-06 VITALS — BP 112/64 | HR 69 | Resp 17 | Wt 303.0 lb

## 2017-02-06 DIAGNOSIS — I70219 Atherosclerosis of native arteries of extremities with intermittent claudication, unspecified extremity: Secondary | ICD-10-CM

## 2017-02-06 DIAGNOSIS — L851 Acquired keratosis [keratoderma] palmaris et plantaris: Secondary | ICD-10-CM | POA: Diagnosis not present

## 2017-02-06 DIAGNOSIS — E1140 Type 2 diabetes mellitus with diabetic neuropathy, unspecified: Secondary | ICD-10-CM | POA: Diagnosis not present

## 2017-02-06 DIAGNOSIS — B351 Tinea unguium: Secondary | ICD-10-CM | POA: Diagnosis not present

## 2017-02-06 DIAGNOSIS — E1165 Type 2 diabetes mellitus with hyperglycemia: Secondary | ICD-10-CM | POA: Diagnosis not present

## 2017-02-06 DIAGNOSIS — E785 Hyperlipidemia, unspecified: Secondary | ICD-10-CM

## 2017-02-06 DIAGNOSIS — E1151 Type 2 diabetes mellitus with diabetic peripheral angiopathy without gangrene: Secondary | ICD-10-CM

## 2017-02-06 DIAGNOSIS — F17200 Nicotine dependence, unspecified, uncomplicated: Secondary | ICD-10-CM

## 2017-02-06 DIAGNOSIS — IMO0002 Reserved for concepts with insufficient information to code with codable children: Secondary | ICD-10-CM

## 2017-02-06 NOTE — Progress Notes (Signed)
Subjective:    Patient ID: Joseph Singh, male    DOB: 08/10/1960, 56 y.o.   MRN: [id number] Chief Complaint  Patient presents with  . Follow-up    86mo abi,bil art   Patient presents for a six-month peripheral artery disease follow-up.  Over the last 3 weeks, the patient has noticed an increase in his right calf claudication.  The patient has noted a significant shortening in his claudication distance.  The patient's symptoms have progressed significantly and he is unable to function on a daily basis.  The patient underwent a right lower extremity ABI which was notable for a right ankle-brachial index suggesting severe right lower extremity arterial occlusive disease.  No evidence of significant left lower extremity arterial disease.  The patient underwent a bilateral lower extremity arterial duplex.  Right lower extremity appears to have an occluded superficial femoral, popliteal, anterior tibial and peroneal artery.  Patient denies any rest pain or ulceration to the lower extremity.  Patient denies any fever, nausea vomiting.  The patient continues to smoke tobacco at this time.   Review of Systems  Constitutional: Negative.   HENT: Negative.   Eyes: Negative.   Respiratory: Negative.   Cardiovascular:       Right lower extremity claudication  Gastrointestinal: Negative.   Endocrine: Negative.   Genitourinary: Negative.   Musculoskeletal: Negative.   Skin: Negative.   Allergic/Immunologic: Negative.   Neurological: Negative.   Hematological: Negative.   Psychiatric/Behavioral: Negative.       Objective:   Physical Exam  Constitutional: He is oriented to person, place, and time. He appears well-developed and well-nourished. No distress.  HENT:  Head: Normocephalic and atraumatic.  Eyes: Conjunctivae are normal. Pupils are equal, round, and reactive to light.  Neck: Normal range of motion.  Cardiovascular: Normal rate, regular rhythm, normal heart sounds and intact distal  pulses.  Pulses:      Radial pulses are 2+ on the right side, and 2+ on the left side.       Dorsalis pedis pulses are 1+ on the left side.       Posterior tibial pulses are 1+ on the left side.  Unable to palpate pedal pulses to the right foot.  Pulmonary/Chest: Effort normal and breath sounds normal.  Musculoskeletal: Normal range of motion. He exhibits edema (Mild bilateral lower extremity edema noted).  Neurological: He is alert and oriented to person, place, and time.  Skin: Skin is warm and dry. He is not diaphoretic.  Skin is intact bilaterally  Psychiatric: He has a normal mood and affect. His behavior is normal. Judgment and thought content normal.  Vitals reviewed.  BP 112/64 (BP Location: Left Arm)   Pulse 69   Resp 17   Wt (!) 303 lb (137.4 kg)   BMI 36.88 kg/m   Past Medical History:  Diagnosis Date  . Arthritis   . Diabetes mellitus without complication [name]   . History of kidney stones   . Hyperlipidemia   . Hypertension   . Kidney stones   . Neuropathy    Social History   Socioeconomic History  . Marital status: Married    Spouse name: Not on file  . Number of children: Not on file  . Years of education: Not on file  . Highest education level: Not on file  Social Needs  . Financial resource strain: Not on file  . Food insecurity - worry: Not on file  . Food insecurity - inability: Not on file  .  Transportation needs - medical: Not on file  . Transportation needs - non-medical: Not on file  Occupational History  . Not on file  Tobacco Use  . Smoking status: Current Every Day Smoker    Packs/day: 1.00    Years: 35.00    Pack years: 35.00    Types: Cigarettes  . Smokeless tobacco: Never Used  Substance and Sexual Activity  . Alcohol use: No    Alcohol/week: 0.0 oz  . Drug use: No  . Sexual activity: Yes    Partners: Female    Comment: Wife  Other Topics Concern  . Not on file  Social History Narrative   Permanently disabled   Larey SeatFell on the  job and hurt his shoulder    Lives with wife and 2 children    1 grandchild from a previous marriage    Pets: Not inside    GED    Right handed    Caffeine- 2-3 sodas, tea, no coffee    Enjoys watching tv and being outside    Past Surgical History:  Procedure Laterality Date  . LOWER EXTREMITY ANGIOGRAPHY Right 02/25/2016   Procedure: Lower Extremity Angiography;  Surgeon: Annice NeedyJason S Dew, MD;  Location: [facility] INVASIVE CV [facility];  Service: Cardiovascular;  Laterality: Right;  . LOWER EXTREMITY INTERVENTION  02/25/2016   Procedure: Lower Extremity Intervention;  Surgeon: Annice NeedyJason S Dew, MD;  Location: [facility] INVASIVE CV LAB;  Service: Cardiovascular;;  . TOE AMPUTATION     Family History  Problem Relation Age of Onset  . Diabetes Mother   . Heart disease Father   . Diabetes Sister   . Diabetes Brother   . Cancer Sister        lung  . Cancer Sister        breast   No Known Allergies     Assessment & Plan:  Patient presents for a six-month peripheral artery disease follow-up.  Over the last 3 weeks, the patient has noticed an increase in his right calf claudication.  The patient has noted a significant shortening in his claudication distance.  The patient's symptoms have progressed significantly and he is unable to function on a daily basis.  The patient underwent a right lower extremity ABI which was notable for a right ankle-brachial index suggesting severe right lower extremity arterial occlusive disease.  No evidence of significant left lower extremity arterial disease.  The patient underwent a bilateral lower extremity arterial duplex.  Right lower extremity appears to have an occluded superficial femoral, popliteal, anterior tibial and peroneal artery.  Patient denies any rest pain or ulceration to the lower extremity.  Patient denies any fever, nausea vomiting.  The patient continues to smoke tobacco at this time.  1. 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 do schedule as soon as possible however he would like to wait until after Christmas Procedure, risks and benefits explained to the patient All questions answered The patient is willing to proceed but after Christmas The patient was instructed to call our office sooner if his symptoms should worsen He expresses his understanding  2. Tobacco use disorder - Stable We had a discussion for approximately 5 minutes regarding the absolute need for smoking cessation due to the deleterious nature  of tobacco on the vascular system. We discussed the tobacco use would diminish patency of any intervention, and likely significantly worsen progressio of disease. We discussed multiple agents for quitting including replacement therapy or medications to reduce cravings such as Chantix. The patient voices their understanding of the importance of smoking cessation.  3. Hyperlipidemia, unspecified hyperlipidemia type - Stable Encouraged good control as its slows the progression of atherosclerotic disease  4. DM (diabetes mellitus) type II uncontrolled, periph vascular disorder [name] - Stable Encouraged good control as its slows the progression of atherosclerotic disease  Current Outpatient Medications on File Prior to Visit  Medication Sig Dispense Refill  . amLODipine (NORVASC) 10 MG tablet Take 1 tablet (10 mg total) by mouth daily. 90 tablet 3  . atorvastatin (LIPITOR) 40 MG tablet Take 1 tablet (40 mg total) by mouth daily. 90 tablet 3  . clopidogrel (PLAVIX) 75 MG tablet TAKE ONE (1) TABLET BY MOUTH EVERY DAY 90 tablet 3  . cyclobenzaprine (FLEXERIL) 10 MG tablet Take 1 tablet (10 mg total) by mouth 3 (three) times  daily as needed for muscle spasms. 30 tablet 2  . empagliflozin (JARDIANCE) 25 MG TABS tablet Take 25 mg by mouth daily. 90 tablet 1  . gabapentin (NEURONTIN) 300 MG capsule TAKE 1 CAPSULE FOUR TIMES DAILY 360 capsule 0  . glipiZIDE (GLUCOTROL XL) 10 MG 24 hr tablet Take 1 tablet (10 mg total) by mouth daily with breakfast. 90 tablet 3  . lisinopril-hydrochlorothiazide (PRINZIDE,ZESTORETIC) 20-25 MG tablet Take 0.5 tablets by mouth daily. 45 tablet 3  . metFORMIN (GLUCOPHAGE) 1000 MG tablet TAKE 1 TABLET BY MOUTH TWICE DAILY WITH A MEAL 180 tablet 3  . metoprolol succinate (TOPROL-XL) 50 MG 24 hr tablet Take 50 mg by mouth 2 (two) times daily. Take with or immediately following a meal.     . metoprolol succinate (TOPROL-XL) 50 MG 24 hr tablet TAKE ONE TABLET BY MOUTH TWICE DAILY 180 tablet 1  . traMADol (ULTRAM) 50 MG tablet TAKE ONE TABLET BY MOUTH 3 TIMES DAILY 180 tablet 0   No current facility-administered medications on file prior to visit.    There are no Patient Instructions on file for this visit. No Follow-up on file.  Bach Rocchi A Lititia Sen, PA-C

## 2017-02-08 ENCOUNTER — Other Ambulatory Visit (INDEPENDENT_AMBULATORY_CARE_PROVIDER_SITE_OTHER): Payer: Self-pay | Admitting: Vascular Surgery

## 2017-02-09 ENCOUNTER — Encounter (INDEPENDENT_AMBULATORY_CARE_PROVIDER_SITE_OTHER): Payer: Self-pay

## 2017-02-15 ENCOUNTER — Inpatient Hospital Stay: Admission: RE | Admit: 2017-02-15 | Payer: Medicare Other | Source: Ambulatory Visit

## 2017-02-16 ENCOUNTER — Encounter: Admission: RE | Payer: Self-pay | Source: Ambulatory Visit

## 2017-02-16 ENCOUNTER — Ambulatory Visit: Admission: RE | Admit: 2017-02-16 | Payer: Medicare Other | Source: Ambulatory Visit | Admitting: Vascular Surgery

## 2017-02-16 ENCOUNTER — Encounter: Payer: Self-pay | Admitting: Family Medicine

## 2017-02-16 SURGERY — LOWER EXTREMITY ANGIOGRAPHY
Anesthesia: Moderate Sedation | Laterality: Right

## 2017-02-17 NOTE — Telephone Encounter (Signed)
Patient is scheduled   

## 2017-02-17 NOTE — Telephone Encounter (Signed)
Wednesday at 1:15 or 4:30 would be fine.

## 2017-02-22 ENCOUNTER — Ambulatory Visit (INDEPENDENT_AMBULATORY_CARE_PROVIDER_SITE_OTHER): Payer: Medicare Other | Admitting: Family Medicine

## 2017-02-22 ENCOUNTER — Encounter: Payer: Self-pay | Admitting: Family Medicine

## 2017-02-22 ENCOUNTER — Encounter (INDEPENDENT_AMBULATORY_CARE_PROVIDER_SITE_OTHER): Payer: Self-pay

## 2017-02-22 DIAGNOSIS — B349 Viral infection, unspecified: Secondary | ICD-10-CM

## 2017-02-22 DIAGNOSIS — M5442 Lumbago with sciatica, left side: Secondary | ICD-10-CM

## 2017-02-22 DIAGNOSIS — G8929 Other chronic pain: Secondary | ICD-10-CM | POA: Diagnosis not present

## 2017-02-22 MED ORDER — TRAMADOL HCL 50 MG PO TABS: ORAL_TABLET | ORAL | 0 refills | Status: DC

## 2017-02-22 NOTE — Assessment & Plan Note (Signed)
Patient symptoms seem to be most consistent with a flulike illness.  He possibly had influenza given the time course of his illness.  His symptoms have resolved at this point and he feels back to his baseline.  No focal infectious findings on exam.  Given that he is improved at this point I think he is okay to proceed with his angiography as planned.

## 2017-02-22 NOTE — Progress Notes (Signed)
Joseph AlarEric Faryn Sieg, MD Phone: [phone]  Joseph Singh is a [age] y.o. male who presents today for follow-up.  Patient was scheduled to have an angiogram last week though developed fevers with chills and vomiting x2 with body aches.  He just did not feel good.  He subsequently canceled the procedure.  He had no cough, congestion, rhinorrhea, rash, shortness of breath, abdominal pain, chest pain, nausea, diarrhea, or dysuria.  He notes no significant other symptoms.  Symptoms lasted for 5 days and resolved on their own.  They have not recurred over the last 5 days.  Patient does have chronic low back pain.  He notes there is a knot in his left low back that has been present for many years.  Pain starts in that area and shoots down the back of his left leg.  Possibly slightly worse.  No numbness or weakness.  Tramadol is beneficial.  Social History   Tobacco Use  Smoking Status Current Every Day Smoker  . Packs/day: 1.00  . Years: 35.00  . Pack years: 35.00  . Types: Cigarettes  Smokeless Tobacco Never Used     ROS see history of present illness  Objective  Physical Exam Vitals:   02/22/17 1311  BP: 132/60  Pulse: 83  Temp: 98.3 F (36.8 C)  SpO2: 96%    BP Readings from Last 3 Encounters:  02/22/17 132/60  02/06/17 112/64  02/01/17 122/72   Wt Readings from Last 3 Encounters:  02/22/17 295 lb 3.2 oz (133.9 kg)  02/06/17 (!) 303 lb (137.4 kg)  02/01/17 (!) 302 lb 3.2 oz (137.1 kg)    Physical Exam  Constitutional: No distress.  HENT:  Mouth/Throat: Oropharynx is clear and moist. No oropharyngeal exudate.  Eyes: Conjunctivae are normal. Pupils are equal, round, and reactive to light.  Cardiovascular: Normal rate, regular rhythm and normal heart sounds.  Pulmonary/Chest: Effort normal and breath sounds normal.  Abdominal: Soft. Bowel sounds are normal. He exhibits no distension. There is no tenderness. There is no rebound and no guarding.  Musculoskeletal: He  exhibits no edema.  No calf tenderness, no midline spine tenderness, there is a knot in the left low back with no warmth, erythema or fluctuance  Neurological: He is alert. Gait normal.  5/5 strength bilateral quads, hamstrings, plantar flexion, and dorsiflexion, sensation light touch intact bilateral lower extremities  Skin: Skin is warm and dry. He is not diaphoretic.     Assessment/Plan: Please see individual problem list.  Viral illness Patient symptoms seem to be most consistent with a flulike illness.  He possibly had influenza given the time course of his illness.  His symptoms have resolved at this point and he feels back to his baseline.  No focal infectious findings on exam.  Given that he is improved at this point I think he is okay to proceed with his angiography as planned.  Back pain Chronic issue.  Possible slight worsening recently.  No midline spine tenderness.  Neurologically intact in his lower extremities.  No signs of infection in the area of tenderness in his back.  Based on history and time course of his fever I do not feel his back pain is related.  Tramadol refilled.  He will monitor his symptoms.   Joseph Singh was seen today for follow-up.  Diagnoses and all orders for this visit:  Viral illness  Chronic left-sided low back pain with left-sided sciatica  Other orders -     traMADol (ULTRAM) 50 MG tablet; TAKE ONE  TABLET BY MOUTH 3 TIMES DAILY, do not fill until 02/27/17    No orders of the defined types were placed in this encounter.   Meds ordered this encounter  Medications  . traMADol (ULTRAM) 50 MG tablet    Sig: TAKE ONE TABLET BY MOUTH 3 TIMES DAILY, do not fill until 02/27/17    Dispense:  180 tablet    Refill:  0     Joseph Alar, MD [facility]

## 2017-02-22 NOTE — Assessment & Plan Note (Addendum)
Chronic issue.  Possible slight worsening recently.  No midline spine tenderness.  Neurologically intact in his lower extremities.  No signs of infection in the area of tenderness in his back.  Based on history and time course of his fever I do not feel his back pain is related.  Tramadol refilled.  He will monitor his symptoms.

## 2017-02-22 NOTE — Progress Notes (Signed)
Pre visit review using our clinic review tool, if applicable. No additional management support is needed unless otherwise documented below in the visit note. 

## 2017-02-22 NOTE — Patient Instructions (Signed)
Nice to see you. If you have recurrence of fevers or you have any new symptoms please be evaluated.

## 2017-02-23 ENCOUNTER — Other Ambulatory Visit (INDEPENDENT_AMBULATORY_CARE_PROVIDER_SITE_OTHER): Payer: Self-pay | Admitting: Vascular Surgery

## 2017-02-24 ENCOUNTER — Encounter
Admission: RE | Admit: 2017-02-24 | Discharge: 2017-02-24 | Disposition: A | Discharge status: Home / Self Care | Payer: Medicare Other | Source: Ambulatory Visit | Attending: Vascular Surgery | Admitting: Vascular Surgery

## 2017-02-24 DIAGNOSIS — Z01812 Encounter for preprocedural laboratory examination: Secondary | ICD-10-CM | POA: Diagnosis not present

## 2017-02-24 LAB — CREATININE, SERUM
CREATININE: 0.87 mg/dL (ref 0.61–1.24)
GFR calc non Af Amer: >60 mL/min (ref >60)

## 2017-02-24 LAB — BUN: BUN: 15 mg/dL (ref 6–20)

## 2017-02-26 MED ORDER — CEFAZOLIN SODIUM-DEXTROSE 2-4 GM/100ML-% IV SOLN
2.0000 g | Freq: Once | INTRAVENOUS | Status: AC
  Administered 2017-02-27: 2 g via INTRAVENOUS

## 2017-02-27 ENCOUNTER — Observation Stay
Admission: RE | Admit: 2017-02-27 | Discharge: 2017-02-28 | Disposition: A | Discharge status: Home / Self Care | Payer: Medicare Other | Source: Ambulatory Visit | Attending: Vascular Surgery | Admitting: Vascular Surgery

## 2017-02-27 ENCOUNTER — Encounter
Admission: RE | Disposition: A | Discharge status: Home / Self Care | Payer: Self-pay | Source: Ambulatory Visit | Attending: Vascular Surgery

## 2017-02-27 ENCOUNTER — Other Ambulatory Visit: Payer: Self-pay

## 2017-02-27 DIAGNOSIS — Z803 Family history of malignant neoplasm of breast: Secondary | ICD-10-CM | POA: Diagnosis not present

## 2017-02-27 DIAGNOSIS — I70211 Atherosclerosis of native arteries of extremities with intermittent claudication, right leg: Principal | ICD-10-CM | POA: Insufficient documentation

## 2017-02-27 DIAGNOSIS — Z79899 Other long term (current) drug therapy: Secondary | ICD-10-CM | POA: Diagnosis not present

## 2017-02-27 DIAGNOSIS — F17210 Nicotine dependence, cigarettes, uncomplicated: Secondary | ICD-10-CM | POA: Insufficient documentation

## 2017-02-27 DIAGNOSIS — Z9889 Other specified postprocedural states: Secondary | ICD-10-CM | POA: Diagnosis not present

## 2017-02-27 DIAGNOSIS — Z7984 Long term (current) use of oral hypoglycemic drugs: Secondary | ICD-10-CM | POA: Insufficient documentation

## 2017-02-27 DIAGNOSIS — E1140 Type 2 diabetes mellitus with diabetic neuropathy, unspecified: Secondary | ICD-10-CM | POA: Diagnosis not present

## 2017-02-27 DIAGNOSIS — E785 Hyperlipidemia, unspecified: Secondary | ICD-10-CM | POA: Insufficient documentation

## 2017-02-27 DIAGNOSIS — I10 Essential (primary) hypertension: Secondary | ICD-10-CM | POA: Diagnosis not present

## 2017-02-27 DIAGNOSIS — Z87442 Personal history of urinary calculi: Secondary | ICD-10-CM | POA: Diagnosis not present

## 2017-02-27 DIAGNOSIS — Z8249 Family history of ischemic heart disease and other diseases of the circulatory system: Secondary | ICD-10-CM | POA: Diagnosis not present

## 2017-02-27 DIAGNOSIS — Z89429 Acquired absence of other toe(s), unspecified side: Secondary | ICD-10-CM | POA: Insufficient documentation

## 2017-02-27 DIAGNOSIS — E1151 Type 2 diabetes mellitus with diabetic peripheral angiopathy without gangrene: Secondary | ICD-10-CM | POA: Diagnosis not present

## 2017-02-27 DIAGNOSIS — Z801 Family history of malignant neoplasm of trachea, bronchus and lung: Secondary | ICD-10-CM | POA: Diagnosis not present

## 2017-02-27 DIAGNOSIS — Z955 Presence of coronary angioplasty implant and graft: Secondary | ICD-10-CM | POA: Insufficient documentation

## 2017-02-27 DIAGNOSIS — Z7902 Long term (current) use of antithrombotics/antiplatelets: Secondary | ICD-10-CM | POA: Insufficient documentation

## 2017-02-27 DIAGNOSIS — Z833 Family history of diabetes mellitus: Secondary | ICD-10-CM | POA: Insufficient documentation

## 2017-02-27 DIAGNOSIS — M1990 Unspecified osteoarthritis, unspecified site: Secondary | ICD-10-CM | POA: Diagnosis not present

## 2017-02-27 HISTORY — PX: LOWER EXTREMITY ANGIOGRAPHY: CATH118251

## 2017-02-27 LAB — URINALYSIS, ROUTINE W REFLEX MICROSCOPIC
BILIRUBIN URINE: NEGATIVE
Bacteria, UA: NONE SEEN
Hgb urine dipstick: NEGATIVE
KETONES UR: NEGATIVE mg/dL
NITRITE: NEGATIVE
PH: 5 (ref 5.0–8.0)
Protein, ur: NEGATIVE mg/dL
Specific Gravity, Urine: 1.035 — ABNORMAL HIGH (ref 1.005–1.030)

## 2017-02-27 LAB — COMPREHENSIVE METABOLIC PANEL
ALK PHOS: 86 U/L (ref 38–126)
ALT: 30 U/L (ref 17–63)
ANION GAP: 6 (ref 5–15)
AST: 19 U/L (ref 15–41)
Albumin: 3.3 g/dL — ABNORMAL LOW (ref 3.5–5.0)
BILIRUBIN TOTAL: 0.6 mg/dL (ref 0.3–1.2)
BUN: 17 mg/dL (ref 6–20)
CALCIUM: 9.1 mg/dL (ref 8.9–10.3)
CO2: 29 mmol/L (ref 22–32)
CREATININE: 0.88 mg/dL (ref 0.61–1.24)
Chloride: 101 mmol/L (ref 101–111)
GFR calc Af Amer: >60 mL/min (ref >60)
GFR calc non Af Amer: >60 mL/min (ref >60)
GLUCOSE: 123 mg/dL — AB (ref 65–99)
Potassium: 4.4 mmol/L (ref 3.5–5.1)
Sodium: 136 mmol/L (ref 135–145)
TOTAL PROTEIN: 6.7 g/dL (ref 6.5–8.1)

## 2017-02-27 LAB — GLUCOSE, CAPILLARY
Glucose-Capillary: 148 mg/dL — ABNORMAL HIGH (ref 65–99)
Glucose-Capillary: 154 mg/dL — ABNORMAL HIGH (ref 65–99)
Glucose-Capillary: 129 mg/dL — ABNORMAL HIGH (ref 65–99)
Glucose-Capillary: 188 mg/dL — ABNORMAL HIGH (ref 65–99)
Glucose-Capillary: 99 mg/dL (ref 65–99)

## 2017-02-27 LAB — CBC
HEMATOCRIT: 42.7 % (ref 40.0–52.0)
HEMOGLOBIN: 14.6 g/dL (ref 13.0–18.0)
MCH: 31.9 pg (ref 26.0–34.0)
MCHC: 34.3 g/dL (ref 32.0–36.0)
MCV: 93.1 fL (ref 80.0–100.0)
Platelets: 358 10*3/uL (ref 150–440)
RBC: 4.59 MIL/uL (ref 4.40–5.90)
RDW: 13.2 % (ref 11.5–14.5)
WBC: 10.5 10*3/uL (ref 3.8–10.6)

## 2017-02-27 LAB — PROTIME-INR
INR: 1.02
PROTHROMBIN TIME: 13.3 s (ref 11.4–15.2)

## 2017-02-27 SURGERY — LOWER EXTREMITY ANGIOGRAPHY
Anesthesia: Moderate Sedation | Site: Leg Lower | Laterality: Right

## 2017-02-27 MED ORDER — DIPHENHYDRAMINE HCL 50 MG/ML IJ SOLN
INTRAMUSCULAR | Status: DC | PRN
  Administered 2017-02-27: 50 mg via INTRAVENOUS

## 2017-02-27 MED ORDER — FENTANYL CITRATE (PF) 100 MCG/2ML IJ SOLN
INTRAMUSCULAR | Status: AC
  Filled 2017-02-27: qty 2

## 2017-02-27 MED ORDER — DIPHENHYDRAMINE HCL 50 MG/ML IJ SOLN
INTRAMUSCULAR | Status: AC
  Filled 2017-02-27: qty 1

## 2017-02-27 MED ORDER — POTASSIUM CHLORIDE CRYS ER 20 MEQ PO TBCR
20.0000 meq | EXTENDED_RELEASE_TABLET | Freq: Once | ORAL | Status: AC
  Administered 2017-02-27: 20 meq via ORAL
  Filled 2017-02-27: qty 1

## 2017-02-27 MED ORDER — TIROFIBAN HCL IV 12.5 MG/250 ML
INTRAVENOUS | Status: AC
  Filled 2017-02-27: qty 250

## 2017-02-27 MED ORDER — TIROFIBAN HCL IV 12.5 MG/250 ML
0.1500 ug/kg/min | INTRAVENOUS | Status: DC
Start: 2017-02-27 — End: 2017-02-28
  Administered 2017-02-27: 0.15 ug/kg/min via INTRAVENOUS
  Filled 2017-02-27: qty 250

## 2017-02-27 MED ORDER — IOPAMIDOL (ISOVUE-300) INJECTION 61%
INTRAVENOUS | Status: DC | PRN
  Administered 2017-02-27: 90 mL via INTRAVENOUS

## 2017-02-27 MED ORDER — TRAMADOL HCL 50 MG PO TABS: ORAL_TABLET | ORAL | 0 refills | Status: DC

## 2017-02-27 MED ORDER — MIDAZOLAM HCL 2 MG/2ML IJ SOLN
INTRAMUSCULAR | Status: AC
  Filled 2017-02-27: qty 2

## 2017-02-27 MED ORDER — LISINOPRIL 20 MG PO TABS
20.0000 mg | ORAL_TABLET | Freq: Every day | ORAL | Status: DC
  Administered 2017-02-28: 20 mg via ORAL
  Filled 2017-02-27: qty 1

## 2017-02-27 MED ORDER — ATORVASTATIN CALCIUM 20 MG PO TABS
40.0000 mg | ORAL_TABLET | Freq: Every day | ORAL | Status: DC
  Administered 2017-02-28: 40 mg via ORAL
  Filled 2017-02-27: qty 2

## 2017-02-27 MED ORDER — GLIPIZIDE ER 10 MG PO TB24
10.0000 mg | ORAL_TABLET | Freq: Every day | ORAL | Status: DC
  Administered 2017-02-28: 10 mg via ORAL
  Filled 2017-02-27: qty 1

## 2017-02-27 MED ORDER — FENTANYL CITRATE (PF) 100 MCG/2ML IJ SOLN
INTRAMUSCULAR | Status: DC | PRN
  Administered 2017-02-27 (×6): 50 ug via INTRAVENOUS

## 2017-02-27 MED ORDER — CLOPIDOGREL BISULFATE 75 MG PO TABS
75.0000 mg | ORAL_TABLET | Freq: Every day | ORAL | Status: DC
  Administered 2017-02-28: 75 mg via ORAL
  Filled 2017-02-27: qty 1

## 2017-02-27 MED ORDER — ONDANSETRON HCL 4 MG/2ML IJ SOLN: 4.0000 mg | Freq: Four times a day (QID) | INTRAMUSCULAR | Status: DC | PRN

## 2017-02-27 MED ORDER — LIDOCAINE HCL (PF) 1 % IJ SOLN
INTRAMUSCULAR | Status: DC | PRN
  Administered 2017-02-27: 10 mL

## 2017-02-27 MED ORDER — METOPROLOL SUCCINATE ER 50 MG PO TB24
50.0000 mg | ORAL_TABLET | Freq: Two times a day (BID) | ORAL | Status: DC
  Administered 2017-02-27 – 2017-02-28 (×2): 50 mg via ORAL
  Filled 2017-02-27 (×2): qty 1

## 2017-02-27 MED ORDER — CYCLOBENZAPRINE HCL 10 MG PO TABS: 10.0000 mg | ORAL_TABLET | Freq: Three times a day (TID) | ORAL | Status: DC | PRN

## 2017-02-27 MED ORDER — FAMOTIDINE 20 MG PO TABS: 40.0000 mg | ORAL_TABLET | ORAL | Status: DC | PRN

## 2017-02-27 MED ORDER — CEFAZOLIN SODIUM-DEXTROSE 2-4 GM/100ML-% IV SOLN
INTRAVENOUS | Status: AC
  Filled 2017-02-27: qty 100

## 2017-02-27 MED ORDER — TRAMADOL HCL 50 MG PO TABS
50.0000 mg | ORAL_TABLET | Freq: Four times a day (QID) | ORAL | Status: DC | PRN
  Administered 2017-02-27: 50 mg via ORAL
  Filled 2017-02-27: qty 1

## 2017-02-27 MED ORDER — HEPARIN SODIUM (PORCINE) 1000 UNIT/ML IJ SOLN
INTRAMUSCULAR | Status: DC | PRN
  Administered 2017-02-27: 5000 [IU] via INTRAVENOUS

## 2017-02-27 MED ORDER — PHENOL 1.4 % MT LIQD
1.0000 | OROMUCOSAL | Status: DC | PRN
  Filled 2017-02-27: qty 177

## 2017-02-27 MED ORDER — ALTEPLASE 2 MG IJ SOLR
INTRAMUSCULAR | Status: DC | PRN
  Administered 2017-02-27: 8 mg

## 2017-02-27 MED ORDER — PANTOPRAZOLE SODIUM 40 MG PO TBEC
40.0000 mg | DELAYED_RELEASE_TABLET | Freq: Every day | ORAL | Status: DC
  Administered 2017-02-27 – 2017-02-28 (×2): 40 mg via ORAL
  Filled 2017-02-27: qty 1

## 2017-02-27 MED ORDER — TIROFIBAN (AGGRASTAT) BOLUS VIA INFUSION
25.0000 ug/kg | Freq: Once | INTRAVENOUS | Status: AC
  Administered 2017-02-27: 12500 ug via INTRAVENOUS
  Filled 2017-02-27: qty 67

## 2017-02-27 MED ORDER — GABAPENTIN 300 MG PO CAPS
300.0000 mg | ORAL_CAPSULE | Freq: Two times a day (BID) | ORAL | Status: DC
  Administered 2017-02-27 – 2017-02-28 (×2): 300 mg via ORAL
  Filled 2017-02-27 (×2): qty 1

## 2017-02-27 MED ORDER — GUAIFENESIN-DM 100-10 MG/5ML PO SYRP: 15.0000 mL | ORAL_SOLUTION | ORAL | Status: DC | PRN

## 2017-02-27 MED ORDER — INSULIN ASPART 100 UNIT/ML ~~LOC~~ SOLN
0.0000 [IU] | Freq: Three times a day (TID) | SUBCUTANEOUS | Status: DC
  Administered 2017-02-27: 3 [IU] via SUBCUTANEOUS
  Administered 2017-02-27: 2 [IU] via SUBCUTANEOUS
  Filled 2017-02-27 (×2): qty 1

## 2017-02-27 MED ORDER — MIDAZOLAM HCL 2 MG/2ML IJ SOLN
INTRAMUSCULAR | Status: AC
  Filled 2017-02-27: qty 4

## 2017-02-27 MED ORDER — INSULIN ASPART 100 UNIT/ML ~~LOC~~ SOLN: 0.0000 [IU] | Freq: Every day | SUBCUTANEOUS | Status: DC

## 2017-02-27 MED ORDER — OXYCODONE HCL 5 MG PO TABS
5.0000 mg | ORAL_TABLET | ORAL | Status: DC | PRN
  Administered 2017-02-27 – 2017-02-28 (×4): 5 mg via ORAL
  Filled 2017-02-27 (×4): qty 1

## 2017-02-27 MED ORDER — HEPARIN SODIUM (PORCINE) 1000 UNIT/ML IJ SOLN
INTRAMUSCULAR | Status: AC
  Filled 2017-02-27: qty 1

## 2017-02-27 MED ORDER — HYDRALAZINE HCL 20 MG/ML IJ SOLN: 5.0000 mg | INTRAMUSCULAR | Status: DC | PRN

## 2017-02-27 MED ORDER — HYDROCHLOROTHIAZIDE 25 MG PO TABS
25.0000 mg | ORAL_TABLET | Freq: Every day | ORAL | Status: DC
  Administered 2017-02-28: 25 mg via ORAL
  Filled 2017-02-27: qty 1

## 2017-02-27 MED ORDER — ALTEPLASE 2 MG IJ SOLR
INTRAMUSCULAR | Status: AC
  Filled 2017-02-27: qty 8

## 2017-02-27 MED ORDER — ALUM & MAG HYDROXIDE-SIMETH 200-200-20 MG/5ML PO SUSP: 15.0000 mL | ORAL | Status: DC | PRN

## 2017-02-27 MED ORDER — SODIUM CHLORIDE 0.9 % IV SOLN
INTRAVENOUS | Status: DC
  Administered 2017-02-27: 09:00:00 via INTRAVENOUS

## 2017-02-27 MED ORDER — LIDOCAINE-EPINEPHRINE (PF) 1 %-1:200000 IJ SOLN
INTRAMUSCULAR | Status: AC
  Filled 2017-02-27: qty 30

## 2017-02-27 MED ORDER — SODIUM CHLORIDE 0.9 % IV SOLN
INTRAVENOUS | Status: DC
  Administered 2017-02-27 (×2): via INTRAVENOUS

## 2017-02-27 MED ORDER — LISINOPRIL-HYDROCHLOROTHIAZIDE 20-25 MG PO TABS: 1.0000 | ORAL_TABLET | Freq: Every day | ORAL | Status: DC

## 2017-02-27 MED ORDER — METOPROLOL TARTRATE 5 MG/5ML IV SOLN: 2.0000 mg | INTRAVENOUS | Status: DC | PRN

## 2017-02-27 MED ORDER — AMLODIPINE BESYLATE 10 MG PO TABS
10.0000 mg | ORAL_TABLET | Freq: Every day | ORAL | Status: DC
  Administered 2017-02-28: 10 mg via ORAL
  Filled 2017-02-27: qty 1

## 2017-02-27 MED ORDER — LABETALOL HCL 5 MG/ML IV SOLN: 10.0000 mg | INTRAVENOUS | Status: DC | PRN

## 2017-02-27 MED ORDER — MIDAZOLAM HCL 2 MG/2ML IJ SOLN
INTRAMUSCULAR | Status: DC | PRN
  Administered 2017-02-27 (×5): 2 mg via INTRAVENOUS

## 2017-02-27 MED ORDER — CANAGLIFLOZIN 100 MG PO TABS: 100.0000 mg | ORAL_TABLET | Freq: Every day | ORAL | Status: DC

## 2017-02-27 MED ORDER — HEPARIN (PORCINE) IN NACL 2-0.9 UNIT/ML-% IJ SOLN
INTRAMUSCULAR | Status: AC
  Filled 2017-02-27: qty 1000

## 2017-02-27 MED ORDER — TIROFIBAN HCL IN NACL 5-0.9 MG/100ML-% IV SOLN
0.1500 ug/kg/min | INTRAVENOUS | Status: DC
  Filled 2017-02-27 (×4): qty 100

## 2017-02-27 MED ORDER — METHYLPREDNISOLONE SODIUM SUCC 125 MG IJ SOLR: 125.0000 mg | INTRAMUSCULAR | Status: DC | PRN

## 2017-02-27 SURGICAL SUPPLY — 27 items
BALLN LUTONIX 4X150X130 (BALLOONS) ×3
BALLN LUTONIX 6X220X130 (BALLOONS) ×3
BALLN ULTRVRSE 5X250X130 (BALLOONS) ×3
BALLOON LUTONIX 4X150X130 (BALLOONS) ×1 IMPLANT
BALLOON LUTONIX 6X220X130 (BALLOONS) ×1 IMPLANT
BALLOON ULTRVRSE 5X250X130 (BALLOONS) ×1 IMPLANT
CANISTER PENUMBRA MAX (MISCELLANEOUS) ×3 IMPLANT
CATH BEACON 5 .038 100 VERT TP (CATHETERS) ×3 IMPLANT
CATH CXI SUPP ANG 4FR 135 (MICROCATHETER) ×1 IMPLANT
CATH CXI SUPP ANG 4FR 135CM (MICROCATHETER) ×3
CATH INDIGO 6 ST-TIP 135CM (CATHETERS) ×3 IMPLANT
CATH INFUS 135CMX50CM (CATHETERS) ×3 IMPLANT
CATH PIG 70CM (CATHETERS) ×3 IMPLANT
CATH VERT 5FR 125CM (CATHETERS) ×3 IMPLANT
DEVICE PRESTO INFLATION (MISCELLANEOUS) ×3 IMPLANT
DEVICE SAFEGUARD 24CM (GAUZE/BANDAGES/DRESSINGS) ×3 IMPLANT
DEVICE STARCLOSE SE CLOSURE (Vascular Products) ×3 IMPLANT
GLIDEWIRE ADV .035X260CM (WIRE) ×3 IMPLANT
PACK ANGIOGRAPHY (CUSTOM PROCEDURE TRAY) ×3 IMPLANT
SHEATH ANL2 6FRX45 HC (SHEATH) ×3 IMPLANT
SHEATH BRITE TIP 5FRX11 (SHEATH) ×3 IMPLANT
SHEATH PINNACLE ST 6F 45CM (SHEATH) ×3 IMPLANT
STENT VIABAHN 5X100X120 (Permanent Stent) ×3 IMPLANT
STENT VIABAHN 6X250X120 (Permanent Stent) ×3 IMPLANT
TUBING ASPIRATION INDIGO (MISCELLANEOUS) ×3 IMPLANT
WIRE G V18X300CM (WIRE) ×3 IMPLANT
WIRE J 3MM .035X145CM (WIRE) ×3 IMPLANT

## 2017-02-27 NOTE — Progress Notes (Signed)
Per Dr. Wyn Quakerew, PAD to remain on left groin site while continuous gtt of Aggrastat infusing.

## 2017-02-27 NOTE — Progress Notes (Signed)
Talked to Aon CorporationStegmayer, P.A about patient requesting other medication than the tramadol as it does not help as much for his leg pain, order for Oxycodone, PRN. Also asked about the PAD if it needs to be deflated as Aggrastat drip will be done at 0400, per P.A ok to deflate PAD and continue to monitor for bleeding. RN will continue to monitor.

## 2017-02-27 NOTE — Discharge Instructions (Signed)
Groin Insertion Instructions-If you lose feeling or develop tingling or pain in your leg or foot after the procedure, please walk around first.  If the discomfort does not improve , contact your physician and proceed to the nearest emergency room.  Loss of feeling in your leg might mean that a blockage has formed in the artery and this can be appropriately treated.  Limit your activity for the next two days after your procedure.  Avoid stooping, bending, heavy lifting or exertion as this may put pressure on the insertion site.  Resume normal activities in 48 hours.  You may shower after 24 hours but avoid excessive warm water and do not scrub the site.  Remove clear dressing in 48 hours.  If you have had a closure device inserted, do not soak in a tub bath or a hot tub for at least one week. ° °No driving for 48 hours after discharge.  After the procedure, check the insertion site occasionally.  If any oozing occurs or there is apparent swelling, firm pressure over the site will prevent a bruise from forming.  You can not hurt anything by pressing directly on the site.  The pressure stops the bleeding by allowing a small clot to form.  If the bleeding continues after the pressure has been applied for more than 15 minutes, call 911 or go to the nearest emergency room.   ° °The x-ray dye causes you to pass a considerate amount of urine.  For this reason, you will be asked to drink plenty of liquids after the procedure to prevent dehydration.  You may resume you regular diet.  Avoid caffeine products.   ° °For pain at the site of your procedure, take non-aspirin medicines such as Tylenol. ° °Medications: A. Hold Metformin for 48 hours if applicable.  B. Continue taking all your present medications at home unless your doctor prescribes any changes. ° °Moderate Conscious Sedation, Adult, Care After °These instructions provide you with information about caring for yourself after your procedure. Your health care provider  may also give you more specific instructions. Your treatment has been planned according to current medical practices, but problems sometimes occur. Call your health care provider if you have any problems or questions after your procedure. °What can I expect after the procedure? °After your procedure, it is common: °· To feel sleepy for several hours. °· To feel clumsy and have poor balance for several hours. °· To have poor judgment for several hours. °· To vomit if you eat too soon. ° °Follow these instructions at home: °For at least 24 hours after the procedure: ° °· Do not: °? Participate in activities where you could fall or become injured. °? Drive. °? Use heavy machinery. °? Drink alcohol. °? Take sleeping pills or medicines that cause drowsiness. °? Make important decisions or sign legal documents. °? Take care of children on your own. °· Rest. °Eating and drinking °· Follow the diet recommended by your health care provider. °· If you vomit: °? Drink water, juice, or soup when you can drink without vomiting. °? Make sure you have little or no nausea before eating solid foods. °General instructions °· Have a responsible adult stay with you until you are awake and alert. °· Take over-the-counter and prescription medicines only as told by your health care provider. °· If you smoke, do not smoke without supervision. °· Keep all follow-up visits as told by your health care provider. This is important. °Contact a health care provider   if: °· You keep feeling nauseous or you keep vomiting. °· You feel light-headed. °· You develop a rash. °· You have a fever. °Get help right away if: °· You have trouble breathing. °This information is not intended to replace advice given to you by your health care provider. Make sure you discuss any questions you have with your health care provider. °Document Released: 11/28/2012 Document Revised: 07/13/2015 Document Reviewed: 05/30/2015 °[employer]vier Interactive Patient Education © 2018  [employer] ° °

## 2017-02-27 NOTE — Op Note (Signed)
[facility] Percutaneous Study/Intervention Procedural Note   Date of Surgery: 02/27/2017  Surgeon(s):Jacque Byron   Assistants:none  Pre-operative Diagnosis: PAD with claudication right lower extremity  Post-operative diagnosis: Same  Procedure(s) Performed: 1. Ultrasound guidance for vascular access left femoral artery 2. Catheter placement into right peroneal artery from left femoral approach 3. Aortogram and selective right lower extremity angiogram 4. Percutaneous transluminal angioplasty of right proximal peroneal artery, tibioperoneal trunk, and distal popliteal artery with 4 mm diameter by 15 cm length Lutonix drug-coated angioplasty balloon 5. Percutaneous transluminal angioplasty of the right distal SFA and popliteal artery with 6 mm diameter by 22 cm length Lutonix drug-coated angioplasty balloon  6.  Catheter directed thrombolytic therapy with 8 mg of TPA delivered to the right SFA, popliteal artery, tibioperoneal trunk, and proximal peroneal arteries  7.  Mechanical thrombectomy with the penumbra cat 6 device to the right SFA, popliteal artery, tibioperoneal trunk, and proximal peroneal arteries  8.  Viabahn covered stent placement to the right SFA and popliteal arteries with 6 mm diameter by 25 cm length covered stent  9.  Viabahn covered stent placement to the right tibioperoneal trunk and distal popliteal artery with 5 mm diameter by 10 cm length covered stent 10. StarClose closure device left femoral artery  EBL: 300 cc  Contrast: 90 cc  Fluoro Time: 11.1 minutes  Moderate Conscious Sedation Time: approximately 75 minutes using 10 mg of Versed and 300 Mcg of Fentanyl  Indications: Patient is a 57 y.o.male with disabling claudication of the right lower extremity which is recently recurrent after previous interventions. The patient has noninvasive study  showing markedly reduced right ABI. The patient is brought in for angiography for further evaluation and potential treatment. Risks and benefits are discussed and informed consent is obtained  Procedure: The patient was identified and appropriate procedural time out was performed. The patient was then placed supine on the table and prepped and draped in the usual sterile fashion.Moderate conscious sedation was administered during a face to face encounter with the patient throughout the procedure with my supervision of the RN administering medicines and monitoring the patient's vital signs, pulse oximetry, telemetry and mental status throughout from the start of the procedure until the patient was taken to the recovery room. Ultrasound was used to evaluate the left common femoral artery. It was patent . A digital ultrasound image was acquired. A Seldinger needle was used to access the left common femoral artery under direct ultrasound guidance and a permanent image was performed. A 0.035 J wire was advanced without resistance and a 5Fr sheath was placed. Pigtail catheter was placed into the aorta and an AP aortogram was performed. This demonstrated normal renal arteries and normal aorta and iliac segments without significant stenosis. I then crossed the aortic bifurcation and advanced to the right femoral head. Selective right lower extremity angiogram was then performed. This demonstrated normal common femoral artery, profunda femoris artery, and proximal superficial femoral artery.  The superficial femoral artery occluded a few centimeters above the previously placed stent at Hunter's canal.  There was then reconstitution of the peroneal and posterior tibial arteries with occlusion of the tibioperoneal trunk and the origins of these vessels.  The anterior tibial artery was chronically occluded.. The patient was systemically heparinized and a 6 [place] Ansell sheath was then placed over the [employer]  wire. I then used a Kumpe catheter and the advantage wire to navigate through the occlusion with a mild amount of difficulty and eventually  confirm intraluminal flow in the peroneal artery with a long Kumpe catheter.  I then replaced the advantage wire.  Angioplasty was performed to the proximal peroneal artery, tibioperoneal trunk, and popliteal artery with a 4 mm diameter by 15 cm length Lutonix drug-coated angioplasty balloon.  This was inflated to 10 atm for 1 minute.  For the above-knee popliteal artery and distal SFA a 6 mm diameter by 22 cm length Lutonix drug-coated angioplasty balloon was inflated to 8 atm for 1 minute.  Completion angiogram following this showed essentially no flow and no improvement with what appeared to be now clear thrombus within the previous SFA and popliteal stent and very poor runoff distally.  Given that this was an acute on chronic situation, 8 mg of TPA was then instilled with a 130 cm total length 50 cm working length thrombolytic catheter to encompass the right SFA, popliteal artery, tibioperoneal trunk, and proximal peroneal artery.  After this was allowed to dwell for several minutes, I elected to perform mechanical thrombectomy.  The penumbra cat 6 device was then placed through the sheath and down into the right SFA and several passes with the thrombectomy device were done in the right SFA, popliteal artery, tibioperoneal trunk, and proximal peroneal arteries.  Despite multiple passes with this device only a small amount was seen.  I then exchanged for a 0.018 wire and placed a Viabahn covered stent from just above the previously placed stent in the right SFA down to the popliteal artery just below the knee.  This was a 6 mm diameter by 25 cm length covered stent.  This was postdilated with a 5 mm balloon.  At this point, there was restoration of flow, but thrombus and stenosis in the tibioperoneal trunk that remained flow-limiting and greater than 70%.  The areas that had  been treated with a stent did not have any greater than 10% residual stenosis.  Another pass with the penumbra cat 6 device showed only mild improvement.  I elected to extend another covered stent down into the tibioperoneal trunk to take care of the residual thrombus and stenosis.  This was a 5 mm diameter by 10 cm length Viabahn covered stent and this was postdilated with a 5 mm balloon.  Completion angiogram showed no greater than residual stenosis in the areas treated with a stent, there was about a 20-25% residual stenosis in the tibioperoneal trunk below the stent that was not flow limiting.  The posterior tibial artery was now the dominant runoff to the foot with no significant residual stenosis.  The peroneal artery had significant spasm with a wire distally and there may have been some thrombosis of the peroneal artery downstream as well.  At this point, his flow had been restored and I did not feel there was much more we could do from a percutaneous standpoint to improve his perfusion.  He will be maintained on Aggrastat overnight for any residual thrombus. I elected to terminate the procedure. The sheath was removed and StarClose closure device was deployed in the left femoral artery with excellent hemostatic result. The patient was taken to the recovery room in stable condition having tolerated the procedure well.  Findings:  Aortogram:Normal renal arteries and iliac arteries mild irregularity of the aorta but no significant stenosis Right lower Extremity:Normal common femoral artery, profunda femoris artery, and proximal superficial femoral artery.  The superficial femoral artery occluded a few centimeters above the previously placed stent at Hunter's canal.  There was then reconstitution of  the peroneal and posterior tibial arteries with occlusion of the tibioperoneal trunk and the origins of these vessels.  The anterior tibial artery was chronically  occluded.   Disposition: Patient was taken to the recovery room in stable condition having tolerated the procedure well.  Complications: None  Leotis Pain 02/27/2017 10:35 AM   This note was created with Dragon Medical transcription system. Any errors in dictation are purely unintentional.

## 2017-02-27 NOTE — H&P (Signed)
[facility] History & Physical Update  The patient was interviewed and re-examined.  The patient's previous History and Physical has been reviewed and is unchanged.  There is no change in the plan of care. We plan to proceed with the scheduled procedure.  Festus BarrenJason Dew, MD  02/27/2017, 8:42 AM

## 2017-02-28 ENCOUNTER — Encounter: Payer: Self-pay | Admitting: *Deleted

## 2017-02-28 DIAGNOSIS — Z7902 Long term (current) use of antithrombotics/antiplatelets: Secondary | ICD-10-CM | POA: Diagnosis not present

## 2017-02-28 DIAGNOSIS — Z833 Family history of diabetes mellitus: Secondary | ICD-10-CM | POA: Diagnosis not present

## 2017-02-28 DIAGNOSIS — Z79899 Other long term (current) drug therapy: Secondary | ICD-10-CM | POA: Diagnosis not present

## 2017-02-28 DIAGNOSIS — E785 Hyperlipidemia, unspecified: Secondary | ICD-10-CM | POA: Diagnosis not present

## 2017-02-28 DIAGNOSIS — E1151 Type 2 diabetes mellitus with diabetic peripheral angiopathy without gangrene: Secondary | ICD-10-CM | POA: Diagnosis not present

## 2017-02-28 DIAGNOSIS — Z8249 Family history of ischemic heart disease and other diseases of the circulatory system: Secondary | ICD-10-CM | POA: Diagnosis not present

## 2017-02-28 DIAGNOSIS — Z89429 Acquired absence of other toe(s), unspecified side: Secondary | ICD-10-CM | POA: Diagnosis not present

## 2017-02-28 DIAGNOSIS — M1990 Unspecified osteoarthritis, unspecified site: Secondary | ICD-10-CM | POA: Diagnosis not present

## 2017-02-28 DIAGNOSIS — I10 Essential (primary) hypertension: Secondary | ICD-10-CM | POA: Diagnosis not present

## 2017-02-28 DIAGNOSIS — Z87442 Personal history of urinary calculi: Secondary | ICD-10-CM | POA: Diagnosis not present

## 2017-02-28 DIAGNOSIS — I70211 Atherosclerosis of native arteries of extremities with intermittent claudication, right leg: Secondary | ICD-10-CM | POA: Diagnosis not present

## 2017-02-28 DIAGNOSIS — Z803 Family history of malignant neoplasm of breast: Secondary | ICD-10-CM | POA: Diagnosis not present

## 2017-02-28 DIAGNOSIS — Z955 Presence of coronary angioplasty implant and graft: Secondary | ICD-10-CM | POA: Diagnosis not present

## 2017-02-28 DIAGNOSIS — Z801 Family history of malignant neoplasm of trachea, bronchus and lung: Secondary | ICD-10-CM | POA: Diagnosis not present

## 2017-02-28 DIAGNOSIS — E1140 Type 2 diabetes mellitus with diabetic neuropathy, unspecified: Secondary | ICD-10-CM | POA: Diagnosis not present

## 2017-02-28 DIAGNOSIS — Z7984 Long term (current) use of oral hypoglycemic drugs: Secondary | ICD-10-CM | POA: Diagnosis not present

## 2017-02-28 DIAGNOSIS — Z9889 Other specified postprocedural states: Secondary | ICD-10-CM | POA: Diagnosis not present

## 2017-02-28 LAB — HIV ANTIBODY (ROUTINE TESTING W REFLEX): HIV Screen 4th Generation wRfx: NONREACTIVE

## 2017-02-28 LAB — GLUCOSE, CAPILLARY: GLUCOSE-CAPILLARY: 133 mg/dL — AB (ref 65–99)

## 2017-02-28 MED ORDER — ASPIRIN EC 81 MG PO TBEC: 81.0000 mg | DELAYED_RELEASE_TABLET | Freq: Every day | ORAL | 2 refills | Status: AC

## 2017-02-28 MED ORDER — OXYCODONE HCL 5 MG PO TABS: 5.0000 mg | ORAL_TABLET | ORAL | 0 refills | Status: DC | PRN

## 2017-02-28 NOTE — Care Management Obs Status (Signed)
MEDICARE OBSERVATION STATUS NOTIFICATION   Patient Details  Name: Joseph Singh R Schnebly MRN: [id number] Date of Birth: Feb 25, 1960   Medicare Observation Status Notification Given:  No  Discharge order placed in < 24hr of being placed on observation   Eber HongGreene, Palak Tercero R, RN 02/28/2017, 10:26 AM

## 2017-02-28 NOTE — Discharge Summary (Signed)
[facility]    Discharge Summary    Patient ID:  Joseph Singh MRN: [id number] DOB/AGE: 1960-08-31 57 y.o.  Admit date: 02/27/2017 Discharge date: 02/28/2017 Date of Surgery: 02/27/2017 Surgeon: Surgeon(s): Annice Needyew, Jason S, MD  Admission Diagnosis: Atherosclerosis with limb claudication [facility](HCC) [I70.219]  Discharge Diagnoses:  Atherosclerosis with limb claudication [facility](HCC) [I70.219]  Secondary Diagnoses: Past Medical History:  Diagnosis Date  . Arthritis   . Diabetes mellitus without complication [name]   . History of kidney stones   . Hyperlipidemia   . Hypertension   . Neuropathy     Procedure(s): LOWER EXTREMITY ANGIOGRAPHY  Discharged Condition: good  HPI:  Patient brought in for PAD and treatment of right leg   Hospital Course:  Joseph Singh is a 57 y.o. male is S/P Right Procedure(s): LOWER EXTREMITY ANGIOGRAPHY Extubated: NA Physical exam: 2+ right PT pulse, access site C/D/I Post-op wounds healing well Pt. Ambulating, voiding and taking PO diet without difficulty. Pt pain controlled with PO pain meds. Labs as below Complications:none  Consults:    Significant Diagnostic Studies: CBC Lab Results  Component Value Date   WBC 10.5 02/27/2017   HGB 14.6 02/27/2017   HCT 42.7 02/27/2017   MCV 93.1 02/27/2017   PLT 358 02/27/2017    BMET    Component Value Date/Time   NA 136 02/27/2017 1259   K 4.4 02/27/2017 1259   CL 101 02/27/2017 1259   CO2 29 02/27/2017 1259   GLUCOSE 123 (H) 02/27/2017 1259   BUN 17 02/27/2017 1259   CREATININE 0.88 02/27/2017 1259   CREATININE 1.15 09/30/2016 1526   CALCIUM 9.1 02/27/2017 1259   GFRNONAA >60 02/27/2017 1259   GFRAA >60 02/27/2017 1259   COAG Lab Results  Component Value Date   INR 1.02 02/27/2017     Disposition:  Discharge to :Home Discharge Instructions    Call MD for:  redness, tenderness, or signs of infection (pain, swelling, bleeding, redness, odor or green/yellow  discharge around incision site)   Complete by:  As directed    Call MD for:  severe or increased pain, loss or decreased feeling  in affected limb(s)   Complete by:  As directed    Call MD for:  temperature >100.5   Complete by:  As directed    Driving Restrictions   Complete by:  As directed    No driving for 24 hours   Lifting restrictions   Complete by:  As directed    No lifting for 24 hours   No dressing needed   Complete by:  As directed    Replace only if drainage present   Resume previous diet   Complete by:  As directed      Allergies as of 02/28/2017   No Known Allergies     Medication List    TAKE these medications   amLODipine 10 MG tablet Commonly known as:  NORVASC Take 1 tablet (10 mg total) by mouth daily.   aspirin EC 81 MG tablet Take 1 tablet (81 mg total) by mouth daily.   atorvastatin 40 MG tablet Commonly known as:  LIPITOR Take 1 tablet (40 mg total) by mouth daily.   clopidogrel 75 MG tablet Commonly known as:  PLAVIX TAKE ONE (1) TABLET BY MOUTH EVERY DAY   cyclobenzaprine 10 MG tablet Commonly known as:  FLEXERIL Take 1 tablet (10 mg total) by mouth 3 (three) times daily as needed for muscle spasms.   empagliflozin 25  MG Tabs tablet Commonly known as:  JARDIANCE Take 25 mg by mouth daily.   gabapentin 300 MG capsule Commonly known as:  NEURONTIN TAKE 1 CAPSULE FOUR TIMES DAILY   glipiZIDE 10 MG 24 hr tablet Commonly known as:  GLUCOTROL XL Take 1 tablet (10 mg total) by mouth daily with breakfast.   lisinopril-hydrochlorothiazide 20-25 MG tablet Commonly known as:  PRINZIDE,ZESTORETIC Take 0.5 tablets by mouth daily. What changed:  how much to take   metFORMIN 1000 MG tablet Commonly known as:  GLUCOPHAGE TAKE 1 TABLET BY MOUTH TWICE DAILY WITH A MEAL What changed:    how much to take  how to take this  when to take this  additional instructions   metoprolol succinate 50 MG 24 hr tablet Commonly known as:   TOPROL-XL TAKE ONE TABLET BY MOUTH TWICE DAILY What changed:    how much to take  how to take this  when to take this   oxyCODONE 5 MG immediate release tablet Commonly known as:  Oxy IR/ROXICODONE Take 1 tablet (5 mg total) by mouth every 4 (four) hours as needed for moderate pain.   traMADol 50 MG tablet Commonly known as:  ULTRAM TAKE ONE TABLET BY MOUTH 3 TIMES DAILY, do not fill until 02/27/17      Verbal and written Discharge instructions given to the patient. Wound care per Discharge AVS Follow-up Information    Dew, Marlow Baars, MD Follow up in 4 week(s).   Specialties:  Vascular Surgery, Radiology, Interventional Cardiology Why:  with ABIs Contact information: [address] [place] [address] [phone]          Return to work in one week Resume normal activities over the next 3-5 days  Signed: Festus Barren, MD  02/28/2017, 8:32 AM

## 2017-02-28 NOTE — Progress Notes (Signed)
Paulette Blanchhomas R Deutscher to be D/C'd Home per MD order. Patient given discharge teaching and paperwork regarding medications, diet, follow-up appointments and activity. Patient understanding verbalized. No questions or complaints at this time. Skin condition as charted. IV and telemetry removed prior to leaving.  Prescriptions given to patient.   An After Visit Summary was printed and given to the patient. Caregiver/family present during discharge teaching.   Patient escorted via wheelchair by volunteer.  Clayborne DanaBeck, Shivank Pinedo B

## 2017-03-14 ENCOUNTER — Other Ambulatory Visit (INDEPENDENT_AMBULATORY_CARE_PROVIDER_SITE_OTHER): Payer: Medicare Other

## 2017-03-14 DIAGNOSIS — E1151 Type 2 diabetes mellitus with diabetic peripheral angiopathy without gangrene: Secondary | ICD-10-CM

## 2017-03-14 DIAGNOSIS — I10 Essential (primary) hypertension: Secondary | ICD-10-CM | POA: Diagnosis not present

## 2017-03-14 DIAGNOSIS — IMO0002 Reserved for concepts with insufficient information to code with codable children: Secondary | ICD-10-CM

## 2017-03-14 DIAGNOSIS — E1165 Type 2 diabetes mellitus with hyperglycemia: Secondary | ICD-10-CM

## 2017-03-14 LAB — COMPREHENSIVE METABOLIC PANEL
ALBUMIN: 3.6 g/dL (ref 3.5–5.2)
ALT: 18 U/L (ref 0–53)
AST: 11 U/L (ref 0–37)
Alkaline Phosphatase: 92 U/L (ref 39–117)
BUN: 13 mg/dL (ref 6–23)
CHLORIDE: 99 meq/L (ref 96–112)
CO2: 32 meq/L (ref 19–32)
Calcium: 9.5 mg/dL (ref 8.4–10.5)
Creatinine, Ser: 0.8 mg/dL (ref 0.40–1.50)
GFR: 105.5 mL/min (ref >60.00)
Glucose, Bld: 141 mg/dL — ABNORMAL HIGH (ref 70–99)
POTASSIUM: 4.7 meq/L (ref 3.5–5.1)
SODIUM: 137 meq/L (ref 135–145)
Total Bilirubin: 0.5 mg/dL (ref 0.2–1.2)
Total Protein: 6.7 g/dL (ref 6.0–8.3)

## 2017-03-14 LAB — LIPID PANEL
CHOLESTEROL: 122 mg/dL (ref 0–200)
HDL: 28.7 mg/dL — AB (ref >39.00)
LDL Cholesterol: 54 mg/dL (ref 0–99)
NonHDL: 93.21
TRIGLYCERIDES: 197 mg/dL — AB (ref 0.0–149.0)
Total CHOL/HDL Ratio: 4
VLDL: 39.4 mg/dL (ref 0.0–40.0)

## 2017-03-14 LAB — MICROALBUMIN / CREATININE URINE RATIO
CREATININE, U: 85.9 mg/dL
MICROALB UR: 0.9 mg/dL (ref 0.0–1.9)
Microalb Creat Ratio: 1 mg/g (ref 0.0–30.0)

## 2017-03-14 LAB — HEMOGLOBIN A1C: Hgb A1c MFr Bld: 7.6 % — ABNORMAL HIGH (ref 4.6–6.5)

## 2017-03-15 ENCOUNTER — Encounter: Payer: Self-pay | Admitting: Family Medicine

## 2017-03-15 DIAGNOSIS — E113293 Type 2 diabetes mellitus with mild nonproliferative diabetic retinopathy without macular edema, bilateral: Secondary | ICD-10-CM | POA: Diagnosis not present

## 2017-03-15 LAB — HM DIABETES EYE EXAM

## 2017-03-16 ENCOUNTER — Other Ambulatory Visit: Payer: Self-pay | Admitting: Family Medicine

## 2017-03-16 MED ORDER — METFORMIN HCL 1000 MG PO TABS: ORAL_TABLET | ORAL | 3 refills | Status: DC

## 2017-03-29 ENCOUNTER — Ambulatory Visit (INDEPENDENT_AMBULATORY_CARE_PROVIDER_SITE_OTHER): Payer: Medicare Other | Admitting: Vascular Surgery

## 2017-03-29 ENCOUNTER — Encounter (INDEPENDENT_AMBULATORY_CARE_PROVIDER_SITE_OTHER): Payer: Self-pay | Admitting: Vascular Surgery

## 2017-03-29 ENCOUNTER — Other Ambulatory Visit (INDEPENDENT_AMBULATORY_CARE_PROVIDER_SITE_OTHER): Payer: Self-pay | Admitting: Vascular Surgery

## 2017-03-29 ENCOUNTER — Ambulatory Visit (INDEPENDENT_AMBULATORY_CARE_PROVIDER_SITE_OTHER): Payer: Medicare Other

## 2017-03-29 VITALS — BP 108/64 | HR 68 | Resp 15 | Ht 76.0 in | Wt 292.0 lb

## 2017-03-29 DIAGNOSIS — I739 Peripheral vascular disease, unspecified: Secondary | ICD-10-CM | POA: Diagnosis not present

## 2017-03-29 DIAGNOSIS — I70211 Atherosclerosis of native arteries of extremities with intermittent claudication, right leg: Secondary | ICD-10-CM | POA: Diagnosis not present

## 2017-03-29 DIAGNOSIS — Z95820 Peripheral vascular angioplasty status with implants and grafts: Secondary | ICD-10-CM

## 2017-03-29 DIAGNOSIS — IMO0002 Reserved for concepts with insufficient information to code with codable children: Secondary | ICD-10-CM

## 2017-03-29 DIAGNOSIS — F17200 Nicotine dependence, unspecified, uncomplicated: Secondary | ICD-10-CM | POA: Diagnosis not present

## 2017-03-29 DIAGNOSIS — E785 Hyperlipidemia, unspecified: Secondary | ICD-10-CM

## 2017-03-29 DIAGNOSIS — Z959 Presence of cardiac and vascular implant and graft, unspecified: Secondary | ICD-10-CM

## 2017-03-29 DIAGNOSIS — E1165 Type 2 diabetes mellitus with hyperglycemia: Secondary | ICD-10-CM | POA: Diagnosis not present

## 2017-03-29 DIAGNOSIS — E1151 Type 2 diabetes mellitus with diabetic peripheral angiopathy without gangrene: Secondary | ICD-10-CM | POA: Diagnosis not present

## 2017-03-29 NOTE — Progress Notes (Signed)
Subjective:    Patient ID: Joseph Singh, male    DOB: [birth year], 57 y.o.   MRN: [id number] Chief Complaint  Patient presents with  . Follow-up    4 week [facility] ABI follow up   Patient presents for his first post procedure follow-up.  The patient is status post a right lower extremity angiogram with intervention on February 27, 2017.  The patient presents today with an improvement to his right lower extremity claudication.  The patient denies any rest pain or ulceration to the bilateral lower extremity.  The patient underwent a bilateral ABI which was notable for normal arterial perfusion to the bilateral lower extremities.  Retrograde flow detected in the bilateral anterior tibial and right peroneal arteries confirmed by duplex.  Significant improvement to the right ABI when compared to the previous exam conducted on February 06, 2017.  The patient denies any fever, nausea or vomiting.   Review of Systems  Constitutional: Negative.   HENT: Negative.   Eyes: Negative.   Respiratory: Negative.   Cardiovascular: Negative.   Gastrointestinal: Negative.   Endocrine: Negative.   Genitourinary: Negative.   Musculoskeletal: Negative.   Skin: Negative.   Allergic/Immunologic: Negative.   Neurological: Negative.   Hematological: Negative.   Psychiatric/Behavioral: Negative.       Objective:   Physical Exam  Constitutional: He is oriented to person, place, and time. He appears well-developed and well-nourished. No distress.  HENT:  Head: Normocephalic and atraumatic.  Eyes: Conjunctivae are normal. Pupils are equal, round, and reactive to light.  Neck: Normal range of motion.  Cardiovascular: Normal rate, regular rhythm, normal heart sounds and intact distal pulses.  Pulses:      Radial pulses are 2+ on the right side, and 2+ on the left side.       Dorsalis pedis pulses are 1+ on the right side, and 1+ on the left side.       Posterior tibial pulses are 1+ on the right side.    Pulmonary/Chest: Effort normal and breath sounds normal.  Musculoskeletal: He exhibits no edema.  Neurological: He is alert and oriented to person, place, and time.  Skin: Skin is warm and dry. He is not diaphoretic.  Psychiatric: He has a normal mood and affect. His behavior is normal. Judgment and thought content normal.  Vitals reviewed.  BP 108/64 (BP Location: Right Arm, Patient Position: Sitting)   Pulse 68   Resp 15   Ht 6\' 4"  (1.93 m)   Wt 292 lb (132.5 kg)   BMI 35.54 kg/m   Past Medical History:  Diagnosis Date  . Arthritis   . Diabetes mellitus without complication [name]   . History of kidney stones   . Hyperlipidemia   . Hypertension   . Neuropathy    Social History   Socioeconomic History  . Marital status: Married    Spouse name: Not on file  . Number of children: Not on file  . Years of education: Not on file  . Highest education level: Not on file  Social Needs  . Financial resource strain: Not on file  . Food insecurity - worry: Not on file  . Food insecurity - inability: Not on file  . Transportation needs - medical: Not on file  . Transportation needs - non-medical: Not on file  Occupational History  . Not on file  Tobacco Use  . Smoking status: Current Every Day Smoker    Packs/day: 0.50    Years: 35.00  Pack years: 17.50    Types: Cigarettes  . Smokeless tobacco: Never Used  Substance and Sexual Activity  . Alcohol use: No    Alcohol/week: 0.0 oz  . Drug use: No  . Sexual activity: Yes    Partners: Female    Comment: Wife  Other Topics Concern  . Not on file  Social History Narrative   Permanently disabled   [employer]at on the job and hurt his shoulder    Lives with wife and 2 children    1 grandchild from a previous marriage    Pets: Not inside    GED    Right handed    Caffeine- 2-3 sodas, tea, no coffee    Enjoys watching tv and being outside    Past Surgical History:  Procedure Laterality Date  . LOWER EXTREMITY ANGIOGRAPHY  Right 02/25/2016   Procedure: Lower Extremity Angiography;  Surgeon: Annice Needy, MD;  Location: [facility] INVASIVE CV LAB;  Service: Cardiovascular;  Laterality: Right;  . LOWER EXTREMITY ANGIOGRAPHY Right 02/27/2017   Procedure: LOWER EXTREMITY ANGIOGRAPHY;  Surgeon: Annice Needy, MD;  Location: [facility] INVASIVE CV LAB;  Service: Cardiovascular;  Laterality: Right;  . LOWER EXTREMITY INTERVENTION  02/25/2016   Procedure: Lower Extremity Intervention;  Surgeon: Annice Needy, MD;  Location: [facility] INVASIVE CV LAB;  Service: Cardiovascular;;  . TOE AMPUTATION     Family History  Problem Relation Age of Onset  . Diabetes Mother   . Heart disease Father   . Diabetes Sister   . Diabetes Brother   . Cancer Sister        lung  . Cancer Sister        breast   No Known Allergies     Assessment & Plan:  Patient presents for his first post procedure follow-up.  The patient is status post a right lower extremity angiogram with intervention on February 27, 2017.  The patient presents today with an improvement to his right lower extremity claudication.  The patient denies any rest pain or ulceration to the bilateral lower extremity.  The patient underwent a bilateral ABI which was notable for normal arterial perfusion to the bilateral lower extremities.  Retrograde flow detected in the bilateral anterior tibial and right peroneal arteries confirmed by duplex.  Significant improvement to the right ABI when compared to the previous exam conducted on February 06, 2017.  The patient denies any fever, nausea or vomiting.  1. Peripheral vascular disease [name] - Stable Patient is status post a right lower extremity angiogram in an attempt to revascularize the extremity The patient presents today with a market improvement in his right lower extremity claudication symptoms The patient also is noted to have an improvement to his right ABI There is no indication for intervention at this time The patient is to follow-up in 6  months with an ABI and a right lower extremity arterial duplex   - VAS [place] ABI WITH/WO TBI; Future - VAS [place] LOWER EXTREMITY ARTERIAL DUPLEX; Future  2. DM (diabetes mellitus) type II uncontrolled, periph vascular disorder [name] - Stable Encouraged good control as its slows the progression of atherosclerotic disease  3. Tobacco use disorder - Stable We had a discussion for approximately 5 minutes regarding the absolute need for smoking cessation due to the deleterious nature of tobacco on the vascular system. We discussed the tobacco use would diminish patency of any intervention, and likely significantly worsen progressio of disease. We discussed multiple agents for quitting including replacement therapy  or medications to reduce cravings such as Chantix. The patient voices their understanding of the importance of smoking cessation.  4. Hyperlipidemia, unspecified hyperlipidemia type - Stable Encouraged good control as its slows the progression of atherosclerotic disease  Current Outpatient Medications on File Prior to Visit  Medication Sig Dispense Refill  . amLODipine (NORVASC) 10 MG tablet Take 1 tablet (10 mg total) by mouth daily. 90 tablet 3  . aspirin EC 81 MG tablet Take 1 tablet (81 mg total) by mouth daily. 150 tablet 2  . atorvastatin (LIPITOR) 40 MG tablet Take 1 tablet (40 mg total) by mouth daily. 90 tablet 3  . clopidogrel (PLAVIX) 75 MG tablet TAKE ONE (1) TABLET BY MOUTH EVERY DAY 90 tablet 3  . cyclobenzaprine (FLEXERIL) 10 MG tablet Take 1 tablet (10 mg total) by mouth 3 (three) times daily as needed for muscle spasms. 30 tablet 2  . empagliflozin (JARDIANCE) 25 MG TABS tablet Take 25 mg by mouth daily. 90 tablet 1  . gabapentin (NEURONTIN) 300 MG capsule TAKE 1 CAPSULE FOUR TIMES DAILY 360 capsule 0  . glipiZIDE (GLUCOTROL XL) 10 MG 24 hr tablet Take 1 tablet (10 mg total) by mouth daily with breakfast. 90 tablet 3  . lisinopril-hydrochlorothiazide (PRINZIDE,ZESTORETIC)  20-25 MG tablet Take 0.5 tablets by mouth daily. (Patient taking differently: Take 1 tablet by mouth daily. ) 45 tablet 3  . metFORMIN (GLUCOPHAGE) 1000 MG tablet TAKE 1 TABLET BY MOUTH TWICE DAILY WITH A MEAL 180 tablet 3  . metoprolol succinate (TOPROL-XL) 50 MG 24 hr tablet TAKE ONE TABLET BY MOUTH TWICE DAILY (Patient taking differently: Take 50 mgs by mouth once daily) 180 tablet 1  . oxyCODONE (OXY IR/ROXICODONE) 5 MG immediate release tablet Take 1 tablet (5 mg total) by mouth every 4 (four) hours as needed for moderate pain. 20 tablet 0  . traMADol (ULTRAM) 50 MG tablet TAKE ONE TABLET BY MOUTH 3 TIMES DAILY, do not fill until 02/27/17 60 tablet 0   No current facility-administered medications on file prior to visit.    There are no Patient Instructions on file for this visit. No Follow-up on file.  Shaan Rhoads A Loghan Kurtzman, PA-C

## 2017-04-21 ENCOUNTER — Other Ambulatory Visit: Payer: Self-pay | Admitting: Family Medicine

## 2017-04-21 NOTE — Telephone Encounter (Signed)
Left message to return call to ask patient how many tablets he has left. Ok for pec to speak to patient

## 2017-04-24 NOTE — Telephone Encounter (Signed)
Last OV 02/22/17 last filled 02/27/17 60 0rf

## 2017-04-25 NOTE — Telephone Encounter (Signed)
Sent to pharmacy 

## 2017-04-25 NOTE — Telephone Encounter (Signed)
Pt calling about status of rx  Pt asking for med to be called in

## 2017-04-25 NOTE — Telephone Encounter (Signed)
Pt following up on refill request.  Pt states he is out of the tramadol.   [id number] - [place], [place] - [address] [phone] (Phone) [phone] (Fax)

## 2017-05-03 ENCOUNTER — Encounter: Payer: Self-pay | Admitting: Family Medicine

## 2017-05-03 ENCOUNTER — Other Ambulatory Visit: Payer: Self-pay

## 2017-05-03 ENCOUNTER — Ambulatory Visit (INDEPENDENT_AMBULATORY_CARE_PROVIDER_SITE_OTHER): Payer: Medicare Other | Admitting: Family Medicine

## 2017-05-03 VITALS — BP 124/78 | HR 70 | Temp 97.6°F | Wt 295.8 lb

## 2017-05-03 DIAGNOSIS — L84 Corns and callosities: Secondary | ICD-10-CM | POA: Diagnosis not present

## 2017-05-03 DIAGNOSIS — E1151 Type 2 diabetes mellitus with diabetic peripheral angiopathy without gangrene: Secondary | ICD-10-CM

## 2017-05-03 DIAGNOSIS — I10 Essential (primary) hypertension: Secondary | ICD-10-CM

## 2017-05-03 DIAGNOSIS — E785 Hyperlipidemia, unspecified: Secondary | ICD-10-CM

## 2017-05-03 DIAGNOSIS — IMO0002 Reserved for concepts with insufficient information to code with codable children: Secondary | ICD-10-CM

## 2017-05-03 DIAGNOSIS — R5383 Other fatigue: Secondary | ICD-10-CM | POA: Diagnosis not present

## 2017-05-03 DIAGNOSIS — I739 Peripheral vascular disease, unspecified: Secondary | ICD-10-CM

## 2017-05-03 DIAGNOSIS — F17200 Nicotine dependence, unspecified, uncomplicated: Secondary | ICD-10-CM

## 2017-05-03 DIAGNOSIS — E1165 Type 2 diabetes mellitus with hyperglycemia: Secondary | ICD-10-CM

## 2017-05-03 LAB — CBC
HEMATOCRIT: 45.9 % (ref 39.0–52.0)
Hemoglobin: 16 g/dL (ref 13.0–17.0)
MCHC: 34.9 g/dL (ref 30.0–36.0)
MCV: 92.9 fl (ref 78.0–100.0)
Platelets: 244 10*3/uL (ref 150.0–400.0)
RBC: 4.94 Mil/uL (ref 4.22–5.81)
RDW: 13.9 % (ref 11.5–15.5)
WBC: 8.4 10*3/uL (ref 4.0–10.5)

## 2017-05-03 LAB — VITAMIN D 25 HYDROXY (VIT D DEFICIENCY, FRACTURES): VITD: 14.77 ng/mL — AB (ref 30.00–100.00)

## 2017-05-03 LAB — TESTOSTERONE: Testosterone: 239.28 ng/dL — ABNORMAL LOW (ref 300.00–890.00)

## 2017-05-03 LAB — TSH: TSH: 0.81 u[IU]/mL (ref 0.35–4.50)

## 2017-05-03 LAB — VITAMIN B12: VITAMIN B 12: 137 pg/mL — AB (ref 211–911)

## 2017-05-03 MED ORDER — BLOOD GLUCOSE MONITOR KIT: PACK | 0 refills | Status: DC

## 2017-05-03 NOTE — Patient Instructions (Addendum)
Nice to see you. We will check a little bit of lab work today. Please consider quitting smoking. We will fax in your glucometer.

## 2017-05-03 NOTE — Assessment & Plan Note (Signed)
Encouraged tobacco cessation.  He is not quite ready to quit.

## 2017-05-03 NOTE — Assessment & Plan Note (Signed)
Just above goal previously.  He reports his sugars have improved with metformin.  We will plan to recheck an A1c at his next visit.

## 2017-05-03 NOTE — Assessment & Plan Note (Signed)
Well-controlled.  Continue Lipitor. 

## 2017-05-03 NOTE — Assessment & Plan Note (Signed)
He is following with vascular surgery.  Symptoms have resolved at this time.

## 2017-05-03 NOTE — Assessment & Plan Note (Signed)
Well-controlled.  Continue current regimen. 

## 2017-05-03 NOTE — Progress Notes (Signed)
[name] Rumps, MD Phone: [phone]  Joseph Singh is a 57 y.o. male who presents today for f/u.  HYPERTENSION Disease Monitoring: Blood pressure range-similar to today Chest pain- no      Dyspnea- no Medications: Compliance- taking amlodipine, lisinopril, HCTZ   Edema- no  DIABETES Disease Monitoring: Blood Sugar ranges-better since increasing metformin, though no specific numbers Polyuria/phagia/dipsia- no      Optho-UTD Medications: Compliance- taking jardiance, glipizide, metformin Hypoglycemic symptoms- no He does note a callus on the ball of his left foot on the lateral aspect that was slightly tender before that has improved.  He sees podiatry next week.  HYPERLIPIDEMIA Disease Monitoring: See symptoms for Hypertension Medications: Compliance- taking lipitor Right upper quadrant pain- no  Muscle aches- no  Has followed up with vascular after revascularization procedure in his right leg.  He notes no pain.  He has been doing well on aspirin and Plavix.  He reports for some time now he has just not had as much get up and go as he used to.  Does not have a lot of energy or desire to do things.  He notes no depression.  No exertional symptoms.  No prior workup.  He continues to smoke though is down to less than a pack per day.  He is not ready to quit yet.    Social History   Tobacco Use  Smoking Status Current Every Day Smoker  . Packs/day: 0.50  . Years: 35.00  . Pack years: 17.50  . Types: Cigarettes  Smokeless Tobacco Never Used     ROS see history of present illness  Objective  Physical Exam Vitals:   05/03/17 0810  BP: 124/78  Pulse: 70  Temp: 97.6 F (36.4 C)  SpO2: 98%    BP Readings from Last 3 Encounters:  05/03/17 124/78  03/29/17 108/64  02/28/17 (!) 125/51   Wt Readings from Last 3 Encounters:  05/03/17 295 lb 12.8 oz (134.2 kg)  03/29/17 292 lb (132.5 kg)  02/27/17 295 lb (133.8 kg)    Physical Exam  Constitutional: No  distress.  Cardiovascular: Normal rate, regular rhythm and normal heart sounds.  Pulmonary/Chest: Effort normal and breath sounds normal.  Musculoskeletal: He exhibits no edema.  Neurological: He is alert. Gait normal.  Skin: Skin is warm and dry. He is not diaphoretic.  Callus on the fibular aspect of plantar surface left foot, nontender, no signs of infection   Assessment/Plan: Please see individual problem list.  DM (diabetes mellitus) type II uncontrolled, periph vascular disorder Just above goal previously.  He reports his sugars have improved with metformin.  We will plan to recheck an A1c at his next visit.  Essential hypertension Well-controlled.  Continue current regimen.  Peripheral vascular disease ([place]) He is following with vascular surgery.  Symptoms have resolved at this time.  Callus of foot He will see podiatry for evaluation.  Tobacco use disorder Encouraged tobacco cessation.  He is not quite ready to quit.  Hyperlipidemia Well-controlled.  Continue Lipitor.  Decreased energy Suspect chronic medical issues are contributing though we will obtain lab work as outlined below to evaluate for other causes.   Orders Placed This Encounter  Procedures  . Testosterone  . CBC  . B12  . Vitamin D (25 hydroxy)  . TSH    Meds ordered this encounter  Medications  . blood glucose meter kit and supplies KIT    Sig: E11.51, check 2x daily, dispense based on patient and insurance preference  Dispense:  1 each    Refill:  0    Order Specific Question:   Number of strips    Answer:   200    Order Specific Question:   Number of lancets    Answer:   200   Health maintenance: Discussed colon cancer screening.  He reports he is never had a colonoscopy.  He does have a box at home for cologuard.  He has had this box for some time.  He wants to check with his insurance to see if this is covered and if so he will let us know so we can order this again.  [name] Rumps,  MD [place]

## 2017-05-03 NOTE — Assessment & Plan Note (Signed)
He will see podiatry for evaluation.

## 2017-05-03 NOTE — Assessment & Plan Note (Signed)
Suspect chronic medical issues are contributing though we will obtain lab work as outlined below to evaluate for other causes.

## 2017-05-06 ENCOUNTER — Other Ambulatory Visit: Payer: Self-pay | Admitting: Family Medicine

## 2017-05-06 DIAGNOSIS — E291 Testicular hypofunction: Secondary | ICD-10-CM

## 2017-05-06 MED ORDER — VITAMIN D (ERGOCALCIFEROL) 1.25 MG (50000 UNIT) PO CAPS: 50000.0000 [IU] | ORAL_CAPSULE | ORAL | 0 refills | Status: DC

## 2017-05-08 ENCOUNTER — Telehealth: Payer: Self-pay

## 2017-05-08 DIAGNOSIS — L851 Acquired keratosis [keratoderma] palmaris et plantaris: Secondary | ICD-10-CM | POA: Diagnosis not present

## 2017-05-08 DIAGNOSIS — E1140 Type 2 diabetes mellitus with diabetic neuropathy, unspecified: Secondary | ICD-10-CM | POA: Diagnosis not present

## 2017-05-08 DIAGNOSIS — B351 Tinea unguium: Secondary | ICD-10-CM | POA: Diagnosis not present

## 2017-05-08 MED ORDER — VITAMIN D (ERGOCALCIFEROL) 1.25 MG (50000 UNIT) PO CAPS: 50000.0000 [IU] | ORAL_CAPSULE | ORAL | 0 refills | Status: DC

## 2017-05-08 NOTE — Telephone Encounter (Signed)
-----   Message from Glori LuisEric G Sonnenberg, MD sent at 05/06/2017 11:13 AM EDT ----- Vitamin D sent to pharmacy.  Lab orders placed.  I really would encourage the B12 injections as there is risk for anemia and neuropathy if vitamin B12 is low persistently.  If he would like to do the injections he can be set up for this.  Thanks.

## 2017-05-09 ENCOUNTER — Other Ambulatory Visit: Payer: Self-pay | Admitting: Family Medicine

## 2017-05-12 ENCOUNTER — Ambulatory Visit (INDEPENDENT_AMBULATORY_CARE_PROVIDER_SITE_OTHER): Payer: Medicare Other | Admitting: *Deleted

## 2017-05-12 ENCOUNTER — Other Ambulatory Visit (INDEPENDENT_AMBULATORY_CARE_PROVIDER_SITE_OTHER): Payer: Medicare Other

## 2017-05-12 DIAGNOSIS — E538 Deficiency of other specified B group vitamins: Secondary | ICD-10-CM

## 2017-05-12 DIAGNOSIS — E291 Testicular hypofunction: Secondary | ICD-10-CM

## 2017-05-12 LAB — FOLLICLE STIMULATING HORMONE: FSH: 6.1 m[IU]/mL (ref 1.4–18.1)

## 2017-05-12 LAB — TESTOSTERONE: Testosterone: 210.21 ng/dL — ABNORMAL LOW (ref 300.00–890.00)

## 2017-05-12 LAB — LUTEINIZING HORMONE: LH: 1.99 m[IU]/mL (ref 1.50–9.30)

## 2017-05-16 DIAGNOSIS — E538 Deficiency of other specified B group vitamins: Secondary | ICD-10-CM

## 2017-05-16 MED ORDER — CYANOCOBALAMIN 1000 MCG/ML IJ SOLN
1000.0000 ug | Freq: Once | INTRAMUSCULAR | Status: AC
  Administered 2017-05-16: 1000 ug via INTRAMUSCULAR

## 2017-05-16 NOTE — Progress Notes (Signed)
Patient presented for B 12 injection to left deltoid, patient voiced no concerns nor showed any signs of distress during injection. 

## 2017-05-18 ENCOUNTER — Ambulatory Visit (INDEPENDENT_AMBULATORY_CARE_PROVIDER_SITE_OTHER): Payer: Medicare Other | Admitting: *Deleted

## 2017-05-18 DIAGNOSIS — E538 Deficiency of other specified B group vitamins: Secondary | ICD-10-CM

## 2017-05-18 MED ORDER — CYANOCOBALAMIN 1000 MCG/ML IJ SOLN
1000.0000 ug | Freq: Once | INTRAMUSCULAR | Status: AC
  Administered 2017-05-18: 1000 ug via INTRAMUSCULAR

## 2017-05-18 NOTE — Progress Notes (Signed)
Patient presented for B 12 injection to right deltoid, patient voiced no concerns nor showed any signs of distress during injection. 

## 2017-05-25 ENCOUNTER — Ambulatory Visit (INDEPENDENT_AMBULATORY_CARE_PROVIDER_SITE_OTHER): Payer: Medicare Other | Admitting: *Deleted

## 2017-05-25 DIAGNOSIS — E538 Deficiency of other specified B group vitamins: Secondary | ICD-10-CM | POA: Diagnosis not present

## 2017-05-25 MED ORDER — CYANOCOBALAMIN 1000 MCG/ML IJ SOLN
1000.0000 ug | Freq: Once | INTRAMUSCULAR | Status: AC
  Administered 2017-05-25: 1000 ug via INTRAMUSCULAR

## 2017-05-25 NOTE — Progress Notes (Signed)
Patient presented for B 12 injection to left deltoid, patient voiced no concerns nor showed any signs of distress during injection. 

## 2017-05-26 ENCOUNTER — Other Ambulatory Visit: Payer: Self-pay | Admitting: Family Medicine

## 2017-06-01 ENCOUNTER — Other Ambulatory Visit: Payer: Self-pay | Admitting: Family Medicine

## 2017-06-01 ENCOUNTER — Ambulatory Visit (INDEPENDENT_AMBULATORY_CARE_PROVIDER_SITE_OTHER): Payer: Medicare Other | Admitting: *Deleted

## 2017-06-01 DIAGNOSIS — E538 Deficiency of other specified B group vitamins: Secondary | ICD-10-CM

## 2017-06-01 DIAGNOSIS — E291 Testicular hypofunction: Secondary | ICD-10-CM

## 2017-06-01 MED ORDER — CYANOCOBALAMIN 1000 MCG/ML IJ SOLN
1000.0000 ug | Freq: Once | INTRAMUSCULAR | Status: AC
  Administered 2017-06-01: 1000 ug via INTRAMUSCULAR

## 2017-06-01 NOTE — Progress Notes (Signed)
Patient presented for B 12 injection to right deltoid, patient voiced no concerns nor showed any signs of distress during injection. 

## 2017-06-07 ENCOUNTER — Other Ambulatory Visit (INDEPENDENT_AMBULATORY_CARE_PROVIDER_SITE_OTHER): Payer: Medicare Other

## 2017-06-07 DIAGNOSIS — E291 Testicular hypofunction: Secondary | ICD-10-CM | POA: Diagnosis not present

## 2017-06-07 LAB — TRANSFERRIN: TRANSFERRIN: 243 mg/dL (ref 212.0–360.0)

## 2017-06-07 LAB — CORTISOL: Cortisol, Plasma: 13.9 ug/dL

## 2017-06-08 LAB — IRON,TIBC AND FERRITIN PANEL
%SAT: 39 % (calc) (ref 15–60)
Ferritin: 443 ng/mL — ABNORMAL HIGH (ref 20–380)
IRON: 117 ug/dL (ref 50–180)
TIBC: 298 ug/dL (ref 250–425)

## 2017-06-08 LAB — PROLACTIN: PROLACTIN: 5 ng/mL (ref 2.0–18.0)

## 2017-06-08 LAB — T4: T4, Total: 10.3 ug/dL (ref 4.9–10.5)

## 2017-07-04 ENCOUNTER — Other Ambulatory Visit: Payer: Self-pay | Admitting: Family Medicine

## 2017-07-05 ENCOUNTER — Ambulatory Visit: Payer: Medicare Other

## 2017-07-06 ENCOUNTER — Ambulatory Visit (INDEPENDENT_AMBULATORY_CARE_PROVIDER_SITE_OTHER): Payer: Medicare Other | Admitting: *Deleted

## 2017-07-06 DIAGNOSIS — E538 Deficiency of other specified B group vitamins: Secondary | ICD-10-CM

## 2017-07-06 MED ORDER — CYANOCOBALAMIN 1000 MCG/ML IJ SOLN
1000.0000 ug | Freq: Once | INTRAMUSCULAR | Status: AC
  Administered 2017-07-06: 1000 ug via INTRAMUSCULAR

## 2017-07-06 NOTE — Progress Notes (Signed)
Patient presented for B 12 injection to left deltoid, patient voiced no concerns nor showed any signs of distress during injection. 

## 2017-07-07 ENCOUNTER — Other Ambulatory Visit: Payer: Self-pay | Admitting: Family Medicine

## 2017-07-07 NOTE — Telephone Encounter (Signed)
Last OV 3/19 and last refill 3/19 for 180 and no refills?

## 2017-07-08 ENCOUNTER — Other Ambulatory Visit: Payer: Self-pay | Admitting: Family Medicine

## 2017-07-29 ENCOUNTER — Other Ambulatory Visit: Payer: Self-pay | Admitting: Family Medicine

## 2017-08-07 ENCOUNTER — Other Ambulatory Visit: Payer: Self-pay | Admitting: Family Medicine

## 2017-08-09 DIAGNOSIS — L851 Acquired keratosis [keratoderma] palmaris et plantaris: Secondary | ICD-10-CM | POA: Diagnosis not present

## 2017-08-09 DIAGNOSIS — E1140 Type 2 diabetes mellitus with diabetic neuropathy, unspecified: Secondary | ICD-10-CM | POA: Diagnosis not present

## 2017-08-09 DIAGNOSIS — B351 Tinea unguium: Secondary | ICD-10-CM | POA: Diagnosis not present

## 2017-08-19 ENCOUNTER — Other Ambulatory Visit: Payer: Self-pay | Admitting: Family Medicine

## 2017-09-01 ENCOUNTER — Other Ambulatory Visit: Payer: Self-pay | Admitting: Family Medicine

## 2017-09-01 NOTE — Telephone Encounter (Signed)
Last OV 05/03/17 last filled 07/07/17 180 0rf

## 2017-09-03 NOTE — Telephone Encounter (Signed)
Sent to pharmacy.  Drug database reviewed. 

## 2017-09-27 ENCOUNTER — Ambulatory Visit (INDEPENDENT_AMBULATORY_CARE_PROVIDER_SITE_OTHER): Payer: Medicare Other | Admitting: Vascular Surgery

## 2017-09-27 ENCOUNTER — Encounter (INDEPENDENT_AMBULATORY_CARE_PROVIDER_SITE_OTHER): Payer: Medicare Other

## 2017-10-04 ENCOUNTER — Other Ambulatory Visit: Payer: Self-pay | Admitting: Family Medicine

## 2017-10-04 ENCOUNTER — Ambulatory Visit (INDEPENDENT_AMBULATORY_CARE_PROVIDER_SITE_OTHER): Payer: Medicare Other

## 2017-10-04 ENCOUNTER — Ambulatory Visit (INDEPENDENT_AMBULATORY_CARE_PROVIDER_SITE_OTHER): Payer: Medicare Other | Admitting: Vascular Surgery

## 2017-10-04 ENCOUNTER — Encounter (INDEPENDENT_AMBULATORY_CARE_PROVIDER_SITE_OTHER): Payer: Self-pay | Admitting: Vascular Surgery

## 2017-10-04 VITALS — BP 118/70 | HR 65 | Resp 13 | Ht 76.0 in | Wt 299.0 lb

## 2017-10-04 DIAGNOSIS — E1165 Type 2 diabetes mellitus with hyperglycemia: Secondary | ICD-10-CM

## 2017-10-04 DIAGNOSIS — I739 Peripheral vascular disease, unspecified: Secondary | ICD-10-CM

## 2017-10-04 DIAGNOSIS — E1151 Type 2 diabetes mellitus with diabetic peripheral angiopathy without gangrene: Secondary | ICD-10-CM

## 2017-10-04 DIAGNOSIS — F17200 Nicotine dependence, unspecified, uncomplicated: Secondary | ICD-10-CM

## 2017-10-04 DIAGNOSIS — IMO0002 Reserved for concepts with insufficient information to code with codable children: Secondary | ICD-10-CM

## 2017-10-04 DIAGNOSIS — I10 Essential (primary) hypertension: Secondary | ICD-10-CM | POA: Diagnosis not present

## 2017-10-04 DIAGNOSIS — E785 Hyperlipidemia, unspecified: Secondary | ICD-10-CM

## 2017-10-04 NOTE — Progress Notes (Signed)
Subjective:    Patient ID: Joseph Singh, male    DOB: 01/11/1961, 57 y.o.   MRN: [id number] Chief Complaint  Patient presents with  . Follow-up    6 month ABI, Arterial    Patient presents for six-month peripheral artery disease follow-up.  The patient has a past medical history of endovascular interventions to the bilateral lower extremity most recent to the right lower extremity in January 2019.  The patient presents today without complaint.  The patient denies any claudication-like symptoms, rest pain or ulcer formation to the bilateral lower extremity.  Patient denies any edema to the lower legs.  Patient denies any recent bouts of cellulitis to the bilateral legs.  The patient underwent a bilateral ABI which was notable for Right: 1.08 biphasic tibial waveforms and Left: 0.92 biphasic tibial waveforms.  Right lower extremity arterial duplex was notable for biphasic waveforms distally with patent SFA and popliteal stents.  ABI and duplex are relatively stable when compared to the previous studies on March 29, 2017.  Review of Systems  Constitutional: Negative.   HENT: Negative.   Eyes: Negative.   Respiratory: Negative.   Cardiovascular: Negative.   Gastrointestinal: Negative.   Endocrine: Negative.   Genitourinary: Negative.   Musculoskeletal: Negative.   Skin: Negative.   Allergic/Immunologic: Negative.   Neurological: Negative.   Hematological: Negative.   Psychiatric/Behavioral: Negative.       Objective:   Physical Exam  Constitutional: He is oriented to person, place, and time. He appears well-developed and well-nourished.  HENT:  Head: Normocephalic and atraumatic.  Right Ear: External ear normal.  Left Ear: External ear normal.  Eyes: Pupils are equal, round, and reactive to light. Conjunctivae and EOM are normal.  Neck: Normal range of motion.  Cardiovascular: Normal rate, regular rhythm, normal heart sounds and intact distal pulses.  Pulses:      Radial  pulses are 2+ on the right side, and 2+ on the left side.  Hard to palpate pedal pulses however the bilateral feet are warm  Pulmonary/Chest: Effort normal and breath sounds normal.  Musculoskeletal: Normal range of motion. He exhibits edema (Bilateral lower extremity edema noted).  Neurological: He is alert and oriented to person, place, and time.  Skin: Skin is warm and dry.  Psychiatric: He has a normal mood and affect. His behavior is normal. Judgment and thought content normal.  Vitals reviewed.  BP 118/70 (BP Location: Right Arm, Patient Position: Sitting)   Pulse 65   Resp 13   Ht _0  (1.93 m)   Wt 299 lb (135.6 kg)   BMI 36.40 kg/m   Past Medical History:  Diagnosis Date  . Arthritis   . Diabetes mellitus without complication ([place])   . History of kidney stones   . Hyperlipidemia   . Hypertension   . Neuropathy    Social History   Socioeconomic History  . Marital status: Married    Spouse name: Not on file  . Number of children: Not on file  . Years of education: Not on file  . Highest education level: Not on file  Occupational History  . Not on file  Social Needs  . Financial resource strain: Not on file  . Food insecurity:    Worry: Not on file    Inability: Not on file  . Transportation needs:    Medical: Not on file    Non-medical: Not on file  Tobacco Use  . Smoking status: Current Every Day Smoker  Packs/day: 0.50    Years: 35.00    Pack years: 17.50    Types: Cigarettes  . Smokeless tobacco: Never Used  Substance and Sexual Activity  . Alcohol use: No    Alcohol/week: 0.0 standard drinks  . Drug use: No  . Sexual activity: Yes    Partners: Female    Comment: Wife  Lifestyle  . Physical activity:    Days per week: Not on file    Minutes per session: Not on file  . Stress: Not on file  Relationships  . Social connections:    Talks on phone: Not on file    Gets together: Not on file    Attends religious service: Not on file    Active  member of club or organization: Not on file    Attends meetings of clubs or organizations: Not on file    Relationship status: Not on file  . Intimate partner violence:    Fear of current or ex partner: Not on file    Emotionally abused: Not on file    Physically abused: Not on file    Forced sexual activity: Not on file  Other Topics Concern  . Not on file  Social History Narrative   Permanently disabled   [place] on the job and hurt his shoulder    Lives with wife and 2 children    1 grandchild from a previous marriage    Pets: Not inside    GED    Right handed    Caffeine- 2-3 sodas, tea, no coffee    Enjoys watching tv and being outside    Past Surgical History:  Procedure Laterality Date  . LOWER EXTREMITY ANGIOGRAPHY Right 02/25/2016   Procedure: Lower Extremity Angiography;  Surgeon: Algernon Huxley, MD;  Location: [facility];  Service: Cardiovascular;  Laterality: Right;  . LOWER EXTREMITY ANGIOGRAPHY Right 02/27/2017   Procedure: LOWER EXTREMITY ANGIOGRAPHY;  Surgeon: Algernon Huxley, MD;  Location: [facility];  Service: Cardiovascular;  Laterality: Right;  . LOWER EXTREMITY INTERVENTION  02/25/2016   Procedure: Lower Extremity Intervention;  Surgeon: Algernon Huxley, MD;  Location: [facility];  Service: Cardiovascular;;  . TOE AMPUTATION     Family History  Problem Relation Age of Onset  . Diabetes Mother   . Heart disease Father   . Diabetes Sister   . Diabetes Brother   . Cancer Sister        lung  . Cancer Sister        breast   No Known Allergies     Assessment & Plan:  Patient presents for six-month peripheral artery disease follow-up.  The patient has a past medical history of endovascular interventions to the bilateral lower extremity most recent to the right lower extremity in January 2019.  The patient presents today without complaint.  The patient denies any claudication-like symptoms, rest pain or ulcer formation to the bilateral lower  extremity.  Patient denies any edema to the lower legs.  Patient denies any recent bouts of cellulitis to the bilateral legs.  The patient underwent a bilateral ABI which was notable for Right: 1.08 biphasic tibial waveforms and Left: 0.92 biphasic tibial waveforms.  Right lower extremity arterial duplex was notable for biphasic waveforms distally with patent SFA and popliteal stents.  ABI and duplex are relatively stable when compared to the previous studies on March 29, 2017.  1. Peripheral vascular disease ([place]) - Stabe Patient presents for a six-month  peripheral artery disease follow-up Patient has a past medical history of endovascular interventions to the bilateral lower extremity Patient presents today without complaint denying any claudication-like symptoms, rest pain or ulcer formation to the bilateral legs ABI and right lower extremity arterial duplex stable when compared to the previous in February 2019 Patient to follow-up in 6 months for a surveillance ABI and bilateral lower extremity arterial duplex I have discussed with the patient at length the risk factors for and pathogenesis of atherosclerotic disease and encouraged a healthy diet, regular exercise regimen and blood pressure / glucose control.  The patient was encouraged to call the office in the interim if he experiences any claudication like symptoms, rest pain or ulcers to his feet / toes.  - VAS [place] ABI WITH/WO TBI; Future - VAS [place] LOWER EXTREMITY ARTERIAL DUPLEX; Future  2. Essential hypertension - Stable Encouraged good control as its slows the progression of atherosclerotic disease  3. DM (diabetes mellitus) type II uncontrolled, periph vascular disorder [name] - Stable Encouraged good control as its slows the progression of atherosclerotic disease  4. Tobacco use disorder - Stable We had a discussion for approximately 5 minutes regarding the absolute need for smoking cessation due to the deleterious nature of tobacco  on the vascular system. We discussed the tobacco use would diminish patency of any intervention, and likely significantly worsen progressio of disease. We discussed multiple agents for quitting including replacement therapy or medications to reduce cravings such as Chantix. The patient voices their understanding of the importance of smoking cessation.  5. Hyperlipidemia, unspecified hyperlipidemia type - Stable On Plavix aspirin and statin Encouraged good control as its slows the progression of atherosclerotic disease  Current Outpatient Medications on File Prior to Visit  Medication Sig Dispense Refill  . amLODipine (NORVASC) 10 MG tablet Take 1 tablet (10 mg total) by mouth daily. 90 tablet 3  . aspirin EC 81 MG tablet Take 1 tablet (81 mg total) by mouth daily. 150 tablet 2  . atorvastatin (LIPITOR) 40 MG tablet TAKE ONE (1) TABLET EACH DAY 90 tablet 3  . blood glucose meter kit and supplies KIT E11.51, check 2x daily, dispense based on patient and insurance preference 1 each 0  . clopidogrel (PLAVIX) 75 MG tablet TAKE ONE (1) TABLET EACH DAY 90 tablet 3  . cyclobenzaprine (FLEXERIL) 10 MG tablet Take 1 tablet (10 mg total) by mouth 3 (three) times daily as needed for muscle spasms. 30 tablet 2  . gabapentin (NEURONTIN) 300 MG capsule TAKE 1 CAPSULE BY MOUTH FOUR TIMES DAILY. 360 capsule 0  . glipiZIDE (GLUCOTROL XL) 10 MG 24 hr tablet TAKE ONE (1) TABLET EACH DAY WITH BREAKFAST 90 tablet 3  . JARDIANCE 25 MG TABS tablet TAKE ONE TABLET BY MOUTH EVERY DAY 180 tablet 0  . lisinopril-hydrochlorothiazide (PRINZIDE,ZESTORETIC) 20-25 MG tablet TAKE ONE (1) TABLET BY MOUTH EVERY DAY 90 tablet 3  . metFORMIN (GLUCOPHAGE) 1000 MG tablet TAKE 1 TABLET BY MOUTH TWICE DAILY WITH A MEAL 180 tablet 3  . Metoprolol Succinate 50 MG CS24 Take by mouth.    . ONE TOUCH ULTRA TEST test strip CHECK 2 TIMES DAILY 200 each 0  . traMADol (ULTRAM) 50 MG tablet TAKE 1 TABLET BY MOUTH THREE TIMES A DAY 90 tablet 1  .  Vitamin D, Ergocalciferol, (DRISDOL) 50000 units CAPS capsule Take 1 capsule (50,000 Units total) by mouth every 7 (seven) days. 8 capsule 0   No current facility-administered medications on file prior to visit.  There are no Patient Instructions on file for this visit. No follow-ups on file.  Finley Chevez A Tiya Schrupp, PA-C

## 2017-10-04 NOTE — Telephone Encounter (Signed)
Last OV 05/03/17 last filled 08/15/16 30 2rf

## 2017-11-01 ENCOUNTER — Other Ambulatory Visit: Payer: Self-pay | Admitting: Family Medicine

## 2017-11-01 NOTE — Telephone Encounter (Signed)
Last office visit 05/03/17 L/f 10/04/17 Next office visit 02/23/18

## 2017-11-01 NOTE — Telephone Encounter (Signed)
I looked up patient on [place] Controlled Substances Reporting System and saw no activity that raised concern of inappropriate use.   

## 2017-11-03 ENCOUNTER — Other Ambulatory Visit: Payer: Self-pay | Admitting: Family Medicine

## 2017-11-07 ENCOUNTER — Other Ambulatory Visit: Payer: Self-pay | Admitting: Family

## 2017-11-07 NOTE — Telephone Encounter (Signed)
Last OV 05/03/2017   Last refilled 11/01/2017 disp 90 with no refills

## 2017-11-13 DIAGNOSIS — B351 Tinea unguium: Secondary | ICD-10-CM | POA: Diagnosis not present

## 2017-11-13 DIAGNOSIS — L851 Acquired keratosis [keratoderma] palmaris et plantaris: Secondary | ICD-10-CM | POA: Diagnosis not present

## 2017-11-13 DIAGNOSIS — E1140 Type 2 diabetes mellitus with diabetic neuropathy, unspecified: Secondary | ICD-10-CM | POA: Diagnosis not present

## 2017-11-15 ENCOUNTER — Other Ambulatory Visit: Payer: Self-pay | Admitting: Family Medicine

## 2017-11-16 NOTE — Telephone Encounter (Signed)
Last OV 10/04/2017   Last refilled 07/31/2017 disp 360 with no refills   Sent to PCP for approval

## 2017-11-22 ENCOUNTER — Other Ambulatory Visit: Payer: Self-pay | Admitting: Family Medicine

## 2017-11-28 ENCOUNTER — Other Ambulatory Visit: Payer: Self-pay | Admitting: Family

## 2017-11-28 NOTE — Telephone Encounter (Signed)
Last refill 11/01/17 Last office visit 02/22/17 Next office visit 02/23/18

## 2017-11-30 NOTE — Telephone Encounter (Signed)
Refill given.  Controlled substance database reviewed.  Patient needs an office visit to receive further refills.  He can be scheduled on a Monday, Wednesday, or Friday at 430.  Thanks.

## 2017-12-05 NOTE — Telephone Encounter (Signed)
Appointment scheduled 12/11/17

## 2017-12-11 ENCOUNTER — Encounter: Payer: Self-pay | Admitting: Family Medicine

## 2017-12-11 ENCOUNTER — Ambulatory Visit (INDEPENDENT_AMBULATORY_CARE_PROVIDER_SITE_OTHER): Payer: Medicare Other | Admitting: Family Medicine

## 2017-12-11 VITALS — BP 124/78 | HR 68 | Temp 98.7°F | Resp 18 | Wt 301.8 lb

## 2017-12-11 DIAGNOSIS — E1142 Type 2 diabetes mellitus with diabetic polyneuropathy: Secondary | ICD-10-CM | POA: Diagnosis not present

## 2017-12-11 DIAGNOSIS — E1165 Type 2 diabetes mellitus with hyperglycemia: Secondary | ICD-10-CM

## 2017-12-11 DIAGNOSIS — M5442 Lumbago with sciatica, left side: Secondary | ICD-10-CM

## 2017-12-11 DIAGNOSIS — G8929 Other chronic pain: Secondary | ICD-10-CM

## 2017-12-11 DIAGNOSIS — E1151 Type 2 diabetes mellitus with diabetic peripheral angiopathy without gangrene: Secondary | ICD-10-CM | POA: Diagnosis not present

## 2017-12-11 DIAGNOSIS — E785 Hyperlipidemia, unspecified: Secondary | ICD-10-CM

## 2017-12-11 DIAGNOSIS — I10 Essential (primary) hypertension: Secondary | ICD-10-CM

## 2017-12-11 DIAGNOSIS — Z23 Encounter for immunization: Secondary | ICD-10-CM

## 2017-12-11 DIAGNOSIS — IMO0002 Reserved for concepts with insufficient information to code with codable children: Secondary | ICD-10-CM

## 2017-12-11 NOTE — Assessment & Plan Note (Signed)
Well-controlled on gabapentin and tramadol.  Monitor for drowsiness with this.

## 2017-12-11 NOTE — Assessment & Plan Note (Signed)
Blood pressure well controlled.  Continue current regimen.  Check CMP.

## 2017-12-11 NOTE — Assessment & Plan Note (Signed)
Chronic issue.  Slight progressive worsening.  Offered referral to a specialist though he deferred.  He will let us know if he changes his mind.  He will continue his current regimen.  Monitor for drowsiness with his medications.

## 2017-12-11 NOTE — Patient Instructions (Signed)
Nice to see you. We will check lab work today and contact you with the results. If you would like to see a specialist for your back please let us know.

## 2017-12-11 NOTE — Progress Notes (Signed)
  [name] Rumps, MD Phone: [phone]  Joseph Singh is a 57 y.o. male who presents today for f/u.  CC: htn, hld, dm, chronic back pain  HYPERTENSION  Disease Monitoring  Home BP Monitoring 120/70 Chest pain- no    Dyspnea- no Medications  Compliance-  Taking amlodipine, lisinopril, HCTZ, metoprolol.  Edema- no  DIABETES Disease Monitoring: Blood Sugar ranges-not checking Polyuria/phagia/dipsia- no      Optho- UTD Medications: Compliance- taking jardiance, glipizide, metformin Hypoglycemic symptoms- no  Hyperlipidemia: Taking Lipitor.  Chronic back pain: Notes this is slowly getting worse.  Bothers him most when he walks.  No recent injury.  No radiation.  No numbness, weakness, saddle anesthesia, or incontinence.  He will take Flexeril if he exacerbates things though this does make him drowsy.  He takes tramadol 3 times daily and that is beneficial with no drowsiness.  That also helps his neuropathy.  No alcohol use.     Social History   Tobacco Use  Smoking Status Current Every Day Smoker  . Packs/day: 0.50  . Years: 35.00  . Pack years: 17.50  . Types: Cigarettes  Smokeless Tobacco Never Used     ROS see history of present illness  Objective  Physical Exam Vitals:   12/11/17 1611  BP: 124/78  Pulse: 68  Resp: 18  Temp: 98.7 F (37.1 C)  SpO2: 94%    BP Readings from Last 3 Encounters:  12/11/17 124/78  10/04/17 118/70  05/03/17 124/78   Wt Readings from Last 3 Encounters:  12/11/17 (!) 301 lb 12.8 oz (136.9 kg)  10/04/17 299 lb (135.6 kg)  05/03/17 295 lb 12.8 oz (134.2 kg)    Physical Exam  Constitutional: No distress.  Cardiovascular: Normal rate, regular rhythm and normal heart sounds.  Pulmonary/Chest: Effort normal and breath sounds normal.  Musculoskeletal: He exhibits no edema.  No midline spine tenderness, no midline spine step-off, there is paraspinal muscle tenderness in the lumbar spine, no overlying skin changes  Neurological:  He is alert.  5/5 strength bilateral quads, hamstrings, plantar flexion, and dorsiflexion, sensation light touch intact bilateral lower extremities, absent patellar reflexes bilaterally  Skin: Skin is warm and dry. He is not diaphoretic.     Assessment/Plan: Please see individual problem list.  DM (diabetes mellitus) type II uncontrolled, periph vascular disorder Check A1c.  Continue current regimen.  Essential hypertension Blood pressure well controlled.  Continue current regimen.  Check CMP.  Diabetic neuropathy ([place]) Well-controlled on gabapentin and tramadol.  Monitor for drowsiness with this.  Hyperlipidemia Continue Lipitor.  Back pain Chronic issue.  Slight progressive worsening.  Offered referral to a specialist though he deferred.  He will let us know if he changes his mind.  He will continue his current regimen.  Monitor for drowsiness with his medications.   Orders Placed This Encounter  Procedures  . HgB A1c  . Comp Met (CMET)    No orders of the defined types were placed in this encounter.    [name] Rumps, MD [place]

## 2017-12-11 NOTE — Assessment & Plan Note (Signed)
-  Continue Lipitor °

## 2017-12-11 NOTE — Assessment & Plan Note (Signed)
Check A1c.  Continue current regimen. 

## 2017-12-12 LAB — COMPREHENSIVE METABOLIC PANEL
ALT: 19 U/L (ref 0–53)
AST: 12 U/L (ref 0–37)
Albumin: 4.2 g/dL (ref 3.5–5.2)
Alkaline Phosphatase: 81 U/L (ref 39–117)
BILIRUBIN TOTAL: 0.4 mg/dL (ref 0.2–1.2)
BUN: 14 mg/dL (ref 6–23)
CO2: 29 meq/L (ref 19–32)
CREATININE: 0.98 mg/dL (ref 0.40–1.50)
Calcium: 9.4 mg/dL (ref 8.4–10.5)
Chloride: 98 mEq/L (ref 96–112)
GFR: 83.25 mL/min (ref >60.00)
GLUCOSE: 207 mg/dL — AB (ref 70–99)
Potassium: 4.4 mEq/L (ref 3.5–5.1)
Sodium: 134 mEq/L — ABNORMAL LOW (ref 135–145)
Total Protein: 7.1 g/dL (ref 6.0–8.3)

## 2017-12-12 LAB — HEMOGLOBIN A1C: HEMOGLOBIN A1C: 8.3 % — AB (ref 4.6–6.5)

## 2017-12-13 ENCOUNTER — Encounter: Payer: Self-pay | Admitting: *Deleted

## 2017-12-19 ENCOUNTER — Other Ambulatory Visit: Payer: Self-pay | Admitting: Family Medicine

## 2017-12-19 MED ORDER — LINAGLIPTIN 5 MG PO TABS: 5.0000 mg | ORAL_TABLET | Freq: Every day | ORAL | 3 refills | Status: DC

## 2017-12-28 ENCOUNTER — Other Ambulatory Visit: Payer: Self-pay | Admitting: Family Medicine

## 2017-12-28 NOTE — Telephone Encounter (Signed)
Last OV 12/11/2017   Last refilled 11/30/2017 disp 90 with no refills   Sent to PCP for approval

## 2017-12-29 NOTE — Telephone Encounter (Signed)
Controlled substance database reviewed. Sent to pharmacy.   

## 2018-01-22 ENCOUNTER — Ambulatory Visit: Payer: Medicare Other

## 2018-01-29 ENCOUNTER — Other Ambulatory Visit: Payer: Self-pay

## 2018-01-29 ENCOUNTER — Ambulatory Visit (INDEPENDENT_AMBULATORY_CARE_PROVIDER_SITE_OTHER): Payer: Medicare Other

## 2018-01-29 ENCOUNTER — Telehealth: Payer: Self-pay | Admitting: Family Medicine

## 2018-01-29 VITALS — BP 118/70 | HR 74 | Temp 98.4°F | Resp 16 | Ht 76.0 in | Wt 301.8 lb

## 2018-01-29 DIAGNOSIS — Z0000 Encounter for general adult medical examination without abnormal findings: Secondary | ICD-10-CM | POA: Diagnosis not present

## 2018-01-29 MED ORDER — EMPAGLIFLOZIN 25 MG PO TABS: 25.0000 mg | ORAL_TABLET | Freq: Every day | ORAL | 1 refills | Status: DC

## 2018-01-29 NOTE — Telephone Encounter (Signed)
Last OV 12/11/2017   Last refilled 07/10/2017 disp 180 with no refills    Rx has been sent.

## 2018-01-29 NOTE — Patient Instructions (Addendum)
  Mr. Joseph Singh , Thank you for taking time to come for your Medicare Wellness Visit. I appreciate your ongoing commitment to your health goals. Please review the following plan we discussed and let me know if I can assist you in the future.   Follow up as needed.    Bring a copy of your Health Care Power of Attorney and/or Living Will to be scanned into chart upon completion.   Have a great day!  These are the goals we discussed: Goals      Patient Stated   . DIET - INCREASE WATER INTAKE (pt-stated)     Less soda       This is a list of the screening recommended for you and due dates:  Health Maintenance  Topic Date Due  . Pneumococcal vaccine  02/12/1961  . Tetanus Vaccine  02/12/1978  . Complete foot exam   02/01/2018  . Eye exam for diabetics  03/15/2018  . Hemoglobin A1C  06/12/2018  . Colon Cancer Screening  02/13/2019  . Flu Shot  Completed  .  Hepatitis C: One time screening is recommended by Center for Disease Control  (CDC) for  adults born from [birth year] through 1967.   Completed  . HIV Screening  Completed

## 2018-01-29 NOTE — Telephone Encounter (Signed)
Pt needs a refill on JARDIANCE 25 MG TABS [facility]  Pt is completely out

## 2018-01-29 NOTE — Telephone Encounter (Signed)
Called and spoke with patient to inform him that Rx has been sent. Pt advised and voiced understanding.

## 2018-01-29 NOTE — Progress Notes (Signed)
Subjective:   Joseph Singh is a 57 y.o. male who presents for Medicare Annual/Subsequent preventive examination.  Review of Systems:  No ROS.  Medicare Wellness Visit. Additional risk factors are reflected in the social history. Cardiac Risk Factors include: male gender;hypertension;diabetes mellitus;obesity (BMI >30kg/m2)     Objective:    Vitals: BP 118/70 (BP Location: Left Arm, Patient Position: Sitting, Cuff Size: Normal)   Pulse 74   Temp 98.4 F (36.9 C) (Oral)   Resp 16   Ht _0  (1.93 m)   Wt (!) 301 lb 12.8 oz (136.9 kg)   SpO2 95%   BMI 36.74 kg/m   Body mass index is 36.74 kg/m.  Advanced Directives 01/29/2018 02/27/2017 02/27/2017 01/19/2017 07/29/2016 03/25/2016 02/25/2016  Does Patient Have a Medical Advance Directive? _1  No No  Does patient want to make changes to medical advance directive? Yes (MAU/Ambulatory/Procedural Areas - Information given) - - - - - -  Would patient like information on creating a medical advance directive? - No - Patient declined No - Patient declined No - Patient declined - - -    Tobacco Social History   Tobacco Use  Smoking Status Current Every Day Smoker  . Packs/day: 0.50  . Years: 35.00  . Pack years: 17.50  . Types: Cigarettes  Smokeless Tobacco Never Used     Ready to quit: Not Answered Counseling given: Not Answered   Clinical Intake:  Pre-visit preparation completed: Yes        Nutritional Status: BMI > 30  Obese Diabetes: Yes(Followed by pcp)  How often do you need to have someone help you when you read instructions, pamphlets, or other written materials from your doctor or pharmacy?: 1 - Never  Interpreter Needed?: No     Past Medical History:  Diagnosis Date  . Arthritis   . Diabetes mellitus without complication ([place])   . History of kidney stones   . Hyperlipidemia   . Hypertension   . Neuropathy    Past Surgical History:  Procedure Laterality Date  . LOWER EXTREMITY ANGIOGRAPHY  Right 02/25/2016   Procedure: Lower Extremity Angiography;  Surgeon: Algernon Huxley, MD;  Location: [facility];  Service: Cardiovascular;  Laterality: Right;  . LOWER EXTREMITY ANGIOGRAPHY Right 02/27/2017   Procedure: LOWER EXTREMITY ANGIOGRAPHY;  Surgeon: Algernon Huxley, MD;  Location: [facility];  Service: Cardiovascular;  Laterality: Right;  . LOWER EXTREMITY INTERVENTION  02/25/2016   Procedure: Lower Extremity Intervention;  Surgeon: Algernon Huxley, MD;  Location: [facility];  Service: Cardiovascular;;  . TOE AMPUTATION     Family History  Problem Relation Age of Onset  . Diabetes Mother   . Heart disease Father   . Diabetes Sister   . Diabetes Brother   . Cancer Sister        lung  . Cancer Sister        breast   Social History   Socioeconomic History  . Marital status: Married    Spouse name: Not on file  . Number of children: Not on file  . Years of education: Not on file  . Highest education level: Not on file  Occupational History  . Not on file  Social Needs  . Financial resource strain: Not hard at all  . Food insecurity:    Worry: Never true    Inability: Never true  . Transportation needs:    Medical: No  Non-medical: No  Tobacco Use  . Smoking status: Current Every Day Smoker    Packs/day: 0.50    Years: 35.00    Pack years: 17.50    Types: Cigarettes  . Smokeless tobacco: Never Used  Substance and Sexual Activity  . Alcohol use: No    Alcohol/week: 0.0 standard drinks  . Drug use: No  . Sexual activity: Yes    Partners: Female    Comment: Wife  Lifestyle  . Physical activity:    Days per week: 3 days    Minutes per session: 20 min  . Stress: Not at all  Relationships  . Social connections:    Talks on phone: Not on file    Gets together: Not on file    Attends religious service: Not on file    Active member of club or organization: Not on file    Attends meetings of clubs or organizations: Not on file    Relationship  status: Married  Other Topics Concern  . Not on file  Social History Narrative   Permanently disabled   [place] on the job and hurt his shoulder    Lives with wife and 2 children    1 grandchild from a previous marriage    Pets: Not inside    GED    Right handed    Caffeine- 2-3 sodas, tea, no coffee    Enjoys watching tv and being outside     Outpatient Encounter Medications as of 01/29/2018  Medication Sig  . amLODipine (NORVASC) 10 MG tablet TAKE ONE (1) TABLET EACH DAY  . aspirin EC 81 MG tablet Take 1 tablet (81 mg total) by mouth daily.  Marland Kitchen atorvastatin (LIPITOR) 40 MG tablet TAKE ONE (1) TABLET EACH DAY  . blood glucose meter kit and supplies KIT E11.51, check 2x daily, dispense based on patient and insurance preference  . clopidogrel (PLAVIX) 75 MG tablet TAKE ONE (1) TABLET EACH DAY  . cyclobenzaprine (FLEXERIL) 10 MG tablet TAKE ONE TABLET BY MOUTH 3 TIMES DAILY AS NEEDED FOR MUSCLE SPASMS.  Marland Kitchen gabapentin (NEURONTIN) 300 MG capsule TAKE 1 CAPSULE BY MOUTH FOUR TIMES DAILY.  Marland Kitchen glipiZIDE (GLUCOTROL XL) 10 MG 24 hr tablet TAKE ONE (1) TABLET EACH DAY WITH BREAKFAST  . glucose blood (ONE TOUCH ULTRA TEST) test strip Check blood sugar twice daily Dx code: E11.9  . linagliptin (TRADJENTA) 5 MG TABS tablet Take 1 tablet (5 mg total) by mouth daily.  Marland Kitchen lisinopril-hydrochlorothiazide (PRINZIDE,ZESTORETIC) 20-25 MG tablet TAKE ONE (1) TABLET BY MOUTH EVERY DAY  . metFORMIN (GLUCOPHAGE) 1000 MG tablet TAKE 1 TABLET BY MOUTH TWICE DAILY WITH A MEAL  . Metoprolol Succinate 50 MG CS24 Take by mouth.  . traMADol (ULTRAM) 50 MG tablet TAKE 1 TABLET BY MOUTH THREE TIMES A DAY  . Vitamin D, Ergocalciferol, (DRISDOL) 50000 units CAPS capsule Take 1 capsule (50,000 Units total) by mouth every 7 (seven) days.  . [DISCONTINUED] JARDIANCE 25 MG TABS tablet TAKE ONE TABLET BY MOUTH EVERY DAY   No facility-administered encounter medications on file as of 01/29/2018.     Activities of Daily Living In  your present state of health, do you have any difficulty performing the following activities: 01/29/2018 02/27/2017  Hearing? N N  Vision? N N  Difficulty concentrating or making decisions? N N  Walking or climbing stairs? Y Y  Comment Difficulty walking long distances. He requests no further follow up at this time.  -  Dressing or bathing? N N  Doing errands, shopping? N N  Preparing Food and eating ? N -  Using the Toilet? N -  In the past six months, have you accidently leaked urine? N -  Do you have problems with loss of bowel control? N -  Managing your Medications? N -  Managing your Finances? N -  Housekeeping or managing your Housekeeping? Y -  Some recent data might be hidden    Patient Care Team: Leone Haven, MD as PCP - General (Family Medicine)   Assessment:   This is a routine wellness examination for [facility]. The goal of the wellness visit is to assist the patient how to close the gaps in care and create a preventative care plan for the patient.   The roster of all physicians providing medical care to patient is listed in the Snapshot section of the chart.  Safety issues reviewed; Smoke and carbon monoxide detectors in the home. No firearms in the home. Wears seatbelts when driving or riding with others. No violence in the home.  They do not have excessive sun exposure.  Discussed the need for sun protection: hats, long sleeves and the use of sunscreen if there is significant sun exposure.  Patient is alert, normal appearance, oriented to person/place/and time.  Correctly identified the president of the [place] and recalls of 2/3 words. Performs simple calculations and can read correct time from watch face.  Displays appropriate judgement.  No new identified risk were noted.  No failures at ADL's or IADL's.    BMI- discussed the importance of a healthy diet, water intake and the benefits of aerobic exercise. Educational material provided.   Sleep patterns-  Sleeps without issues.   TDAP vaccine deferred per patient preference.  Follow up with insurance.  Educational material provided.  Pneumococcal vaccine discussed.  Deferred per patient request.   Patient Concerns: None at this time. Follow up with PCP as needed.  Exercise Activities and Dietary recommendations Current Exercise Habits: Home exercise routine, Type of exercise: walking, Time (Minutes): 20, Frequency (Times/Week): 3, Weekly Exercise (Minutes/Week): 60, Intensity: Mild  Goals      Patient Stated   . DIET - INCREASE WATER INTAKE (pt-stated)     Less soda       Fall Risk Fall Risk  01/29/2018 01/19/2017 06/13/2016 02/01/2016  Falls in the past year? 0 No No No   Depression Screen PHQ 2/9 Scores 01/29/2018 01/19/2017 06/13/2016 02/01/2016  PHQ - 2 Score 0 0 0 0  PHQ- 9 Score - 0 - -    Cognitive Function MMSE - Mini Mental State Exam 01/19/2017  Orientation to time 5  Orientation to Place 5  Registration 3  Attention/ Calculation 5  Recall 3  Language- name 2 objects 2  Language- repeat 1  Language- follow 3 step command 3  Language- read & follow direction 1  Write a sentence 1  Copy design 1  Total score 30     6CIT Screen 01/29/2018  What Year? 0 points  What month? 0 points  What time? 0 points  Count back from 20 0 points  Months in reverse 0 points    Immunization History  Administered Date(s) Administered  . Influenza,inj,Quad PF,6+ Mos 12/11/2017   Screening Tests Health Maintenance  Topic Date Due  . PNEUMOCOCCAL POLYSACCHARIDE VACCINE AGE 47-64 HIGH RISK  02/12/1961  . TETANUS/TDAP  02/12/1978  . FOOT EXAM  02/01/2018  . OPHTHALMOLOGY EXAM  03/15/2018  . HEMOGLOBIN A1C  06/12/2018  . COLONOSCOPY  02/13/2019  . INFLUENZA VACCINE  Completed  . Hepatitis C Screening  Completed  . HIV Screening  Completed      Plan:    End of life planning; Advance aging; Advanced directives discussed. Copy of current HCPOA/Living Will requested  upon completion.    Keep all routine maintenance appointments.   I have personally reviewed and noted the following in the patient's chart:   . Medical and social history . Use of alcohol, tobacco or illicit drugs  . Current medications and supplements . Functional ability and status . Nutritional status . Physical activity . Advanced directives . List of other physicians . Hospitalizations, surgeries, and ER visits in previous 12 months . Vitals . Screenings to include cognitive, depression, and falls . Referrals and appointments  In addition, I have reviewed and discussed with patient certain preventive protocols, quality metrics, and best practice recommendations. A written personalized care plan for preventive services as well as general preventive health recommendations were provided to patient.     Varney Biles, LPN  18/03/9935

## 2018-02-01 NOTE — Progress Notes (Signed)
I have reviewed the above note and agree.  Sheridan Hew, M.D.  

## 2018-02-12 ENCOUNTER — Other Ambulatory Visit: Payer: Self-pay | Admitting: Internal Medicine

## 2018-02-12 ENCOUNTER — Telehealth: Payer: Self-pay | Admitting: Family Medicine

## 2018-02-12 DIAGNOSIS — G629 Polyneuropathy, unspecified: Secondary | ICD-10-CM

## 2018-02-12 MED ORDER — GABAPENTIN 300 MG PO CAPS: 300.0000 mg | ORAL_CAPSULE | Freq: Four times a day (QID) | ORAL | 0 refills | Status: DC

## 2018-02-12 NOTE — Telephone Encounter (Signed)
Last OV 12/11/2017   Last refilled 11/19/17

## 2018-02-12 NOTE — Telephone Encounter (Signed)
Called and spoke with patient. Pt is aware that PCP will be out of the office until Friday patient would like to see if a covering provider could fill this out today.   [facility]   Last OV 12/11/2017   Last refilled 11/19/2017 disp 360 with no refills

## 2018-02-12 NOTE — Telephone Encounter (Signed)
Last OV 12/11/2017   Last refilled 11/19/2017 disp 360 with no refills

## 2018-02-12 NOTE — Telephone Encounter (Signed)
Copied from CRM [id number]. Topic: Quick Communication - Rx Refill/Question >> Feb 12, 2018  1:45 PM Gerrianne ScalePayne, Mckynlie Vanderslice L wrote: Medication: gabapentin (NEURONTIN) 300 MG capsule  Has the patient contacted their pharmacy? Yes.  Friday  (Agent: If no, request that the patient contact the pharmacy for the refill.) (Agent: If yes, when and what did the pharmacy advise?) pharmacy haven't heard from provider  Preferred Pharmacy (with phone number or street name): [id number] [place], [place] - [address] [phone] (Phone) [phone] (Fax)    Agent: Please be advised that RX refills may take up to 3 business days. We ask that you follow-up with your pharmacy.

## 2018-02-12 NOTE — Telephone Encounter (Signed)
Sent #90 day supply further refills PCP

## 2018-02-22 ENCOUNTER — Other Ambulatory Visit: Payer: Self-pay | Admitting: Family Medicine

## 2018-02-22 NOTE — Telephone Encounter (Signed)
Controlled substance database reviewed. Sent to pharmacy.   

## 2018-02-23 ENCOUNTER — Telehealth (INDEPENDENT_AMBULATORY_CARE_PROVIDER_SITE_OTHER): Payer: Self-pay

## 2018-02-23 ENCOUNTER — Ambulatory Visit (INDEPENDENT_AMBULATORY_CARE_PROVIDER_SITE_OTHER): Payer: Medicare Other | Admitting: Family Medicine

## 2018-02-23 ENCOUNTER — Ambulatory Visit (INDEPENDENT_AMBULATORY_CARE_PROVIDER_SITE_OTHER): Payer: Medicare Other

## 2018-02-23 ENCOUNTER — Other Ambulatory Visit (INDEPENDENT_AMBULATORY_CARE_PROVIDER_SITE_OTHER): Payer: Self-pay | Admitting: Vascular Surgery

## 2018-02-23 ENCOUNTER — Encounter: Payer: Self-pay | Admitting: Family Medicine

## 2018-02-23 VITALS — BP 136/72 | HR 79 | Temp 98.4°F | Ht 76.0 in | Wt 303.4 lb

## 2018-02-23 DIAGNOSIS — E11621 Type 2 diabetes mellitus with foot ulcer: Secondary | ICD-10-CM

## 2018-02-23 DIAGNOSIS — E1151 Type 2 diabetes mellitus with diabetic peripheral angiopathy without gangrene: Secondary | ICD-10-CM | POA: Diagnosis not present

## 2018-02-23 DIAGNOSIS — R7989 Other specified abnormal findings of blood chemistry: Secondary | ICD-10-CM | POA: Insufficient documentation

## 2018-02-23 DIAGNOSIS — I10 Essential (primary) hypertension: Secondary | ICD-10-CM

## 2018-02-23 DIAGNOSIS — L97421 Non-pressure chronic ulcer of left heel and midfoot limited to breakdown of skin: Secondary | ICD-10-CM

## 2018-02-23 DIAGNOSIS — E1142 Type 2 diabetes mellitus with diabetic polyneuropathy: Secondary | ICD-10-CM | POA: Diagnosis not present

## 2018-02-23 DIAGNOSIS — M5442 Lumbago with sciatica, left side: Secondary | ICD-10-CM

## 2018-02-23 DIAGNOSIS — E785 Hyperlipidemia, unspecified: Secondary | ICD-10-CM

## 2018-02-23 DIAGNOSIS — E1165 Type 2 diabetes mellitus with hyperglycemia: Secondary | ICD-10-CM | POA: Diagnosis not present

## 2018-02-23 DIAGNOSIS — L84 Corns and callosities: Secondary | ICD-10-CM

## 2018-02-23 DIAGNOSIS — L97529 Non-pressure chronic ulcer of other part of left foot with unspecified severity: Secondary | ICD-10-CM | POA: Diagnosis not present

## 2018-02-23 DIAGNOSIS — G8929 Other chronic pain: Secondary | ICD-10-CM

## 2018-02-23 DIAGNOSIS — IMO0002 Reserved for concepts with insufficient information to code with codable children: Secondary | ICD-10-CM

## 2018-02-23 DIAGNOSIS — E291 Testicular hypofunction: Secondary | ICD-10-CM

## 2018-02-23 LAB — BASIC METABOLIC PANEL
BUN: 11 mg/dL (ref 6–23)
CO2: 25 mEq/L (ref 19–32)
Calcium: 9.3 mg/dL (ref 8.4–10.5)
Chloride: 100 mEq/L (ref 96–112)
Creatinine, Ser: 0.76 mg/dL (ref 0.40–1.50)
GFR: 111.56 mL/min (ref >60.00)
GLUCOSE: 171 mg/dL — AB (ref 70–99)
Potassium: 4 mEq/L (ref 3.5–5.1)
SODIUM: 136 meq/L (ref 135–145)

## 2018-02-23 LAB — CBC
HCT: 47.6 % (ref 39.0–52.0)
Hemoglobin: 16.2 g/dL (ref 13.0–17.0)
MCHC: 34 g/dL (ref 30.0–36.0)
MCV: 92.4 fl (ref 78.0–100.0)
Platelets: 267 10*3/uL (ref 150.0–400.0)
RBC: 5.15 Mil/uL (ref 4.22–5.81)
RDW: 13 % (ref 11.5–15.5)
WBC: 8.9 10*3/uL (ref 4.0–10.5)

## 2018-02-23 LAB — SEDIMENTATION RATE: Sed Rate: 23 mm/hr — ABNORMAL HIGH (ref 0–20)

## 2018-02-23 LAB — FERRITIN: Ferritin: 230.5 ng/mL (ref 22.0–322.0)

## 2018-02-23 MED ORDER — METOPROLOL SUCCINATE 50 MG PO CS24: 1.0000 | EXTENDED_RELEASE_CAPSULE | Freq: Every day | ORAL | 0 refills | Status: DC

## 2018-02-23 NOTE — Assessment & Plan Note (Signed)
Seems to have developed some symptoms in his fingers.  He is neurologically otherwise intact.  This is consistent with neuropathy.  He will monitor.  I would consider increasing dose of gabapentin if necessary.

## 2018-02-23 NOTE — Patient Instructions (Signed)
Nice to see you. We will check lab work today and contact you with the results. We will have you return in a month for other lab work.

## 2018-02-23 NOTE — Assessment & Plan Note (Signed)
Discussed referral to urology.  He will let us know if he would like to proceed with this.

## 2018-02-23 NOTE — Assessment & Plan Note (Signed)
Stable.  Continue tramadol as needed

## 2018-02-23 NOTE — Assessment & Plan Note (Signed)
He will keep his appointment with podiatry.

## 2018-02-23 NOTE — Assessment & Plan Note (Signed)
Check glucose.  Continue current regimen.  Check renal function.  He will return for an A1c and fasting labs in a month.

## 2018-02-23 NOTE — Assessment & Plan Note (Signed)
Ulceration noted limited to skin breakdown.  There are no signs of infection.  We will obtain an x-ray to evaluate for underlying bony cause.  Obtain CBC and ESR to evaluate for signs of infection.  If there are signs of infection on lab work would consider placing patient on antibiotics for superficial infection.  Given return precautions.

## 2018-02-23 NOTE — Addendum Note (Signed)
Addended by: Glori Luis on: 02/23/2018 12:34 PM   Modules accepted: Orders

## 2018-02-23 NOTE — Telephone Encounter (Signed)
Patient called and left a message on the triage line and stated that he needed a refill on his Metoprolol. [facility] [place] Per medication list that medication is listed as a historical medication and does not state it has been filled here before. Please advise

## 2018-02-23 NOTE — Assessment & Plan Note (Signed)
Improved on recheck to 120/72.  He will continue his current regimen.

## 2018-02-23 NOTE — Telephone Encounter (Signed)
Done

## 2018-02-23 NOTE — Assessment & Plan Note (Signed)
Recheck ferritin.  Suspect this is related to chronic inflammation.

## 2018-02-23 NOTE — Addendum Note (Signed)
Addended by: Yolande Jolly on: 02/23/2018 01:45 PM   Modules accepted: Orders

## 2018-02-23 NOTE — Progress Notes (Addendum)
[name] Rumps, MD Phone: [phone]  Joseph Singh is a 58 y.o. male who presents today for f/u.  CC: htn, dm, back pain, neuropathy, hypogonadism  Hypertension: Not checking at home.  He is taking amlodipine, lisinopril, HCTZ, and metoprolol.  No chest pain, shortness of breath, or edema.  Diabetes: Not checking blood sugars.  Taking Jardiance, glipizide, Tradjenta, and metformin.  No polyuria or polydipsia.  No hypoglycemia.  Chronic back pain: Patient notes it was giving him a fit last week though it is improved at this time.  He will take a Flexeril very rarely as this does make him drowsy.  He takes tramadol daily for his neuropathy and this does not make him drowsy.  No alcohol intake.  No numbness other than his neuropathy symptoms.  No weakness or incontinence.  Neuropathy: Patient notes neuropathy in his bilateral feet.  He also notes onset of tingling in his bilateral fingers that has progressed from his pinky over to his middle and index fingers.  He notes it is there most of the time.  Has been there for months.  Feels similar to his neuropathy symptoms in his feet.  He is taking gabapentin and tramadol.  He does have a callus on the ball of his left foot.  He follows with podiatry for this and he will see them later this month for treatment of this issue.  Hypogonadism: Patient's testosterone previously low.  Follow-up lab work revealed mildly elevated ferritin.  Due for recheck.  Discussed referral to urology.  Social History   Tobacco Use  Smoking Status Current Every Day Smoker  . Packs/day: 0.50  . Years: 35.00  . Pack years: 17.50  . Types: Cigarettes  Smokeless Tobacco Never Used     ROS see history of present illness  Objective  Physical Exam Vitals:   02/23/18 0819 02/23/18 0908  BP: 120/72 136/72  Pulse: 79   Temp: 98.4 F (36.9 C)   SpO2: 98%     BP Readings from Last 3 Encounters:  02/23/18 136/72  01/29/18 118/70  12/11/17 124/78   Wt  Readings from Last 3 Encounters:  02/23/18 (!) 303 lb 6.4 oz (137.6 kg)  01/29/18 (!) 301 lb 12.8 oz (136.9 kg)  12/11/17 (!) 301 lb 12.8 oz (136.9 kg)    Physical Exam Constitutional:      General: He is not in acute distress.    Appearance: He is not diaphoretic.  Cardiovascular:     Rate and Rhythm: Normal rate and regular rhythm.     Heart sounds: Normal heart sounds.  Pulmonary:     Effort: Pulmonary effort is normal.     Breath sounds: Normal breath sounds.  Skin:    General: Skin is warm and dry.  Neurological:     Mental Status: He is alert.     Comments: 5/5 strength in bilateral biceps, triceps, grip, quads, hamstrings, plantar and dorsiflexion, sensation to light touch slightly decreased bilateral second through fifth fingertips and bilateral feet, otherwise intact in bilateral UE and LE, normal gait, absent patellar reflexes    Diabetic Foot Exam - Simple   Simple Foot Form Diabetic Foot exam was performed with the following findings:  Yes 02/23/2018  9:02 AM  Visual Inspection See comments:  Yes Sensation Testing See comments:  Yes Pulse Check Posterior Tibialis and Dorsalis pulse intact bilaterally:  Yes Comments Patient with callus on the left foot ball of foot plantar surface, no signs of infection, flaking of skin, decreased monofilament  and light touch sensation bilateral feet Patient did come back to the office after leaving noting that there was a ulceration on the left lateral portion of his left foot, flaking was noted previously, there is serous drainage, no purulent drainage, no surrounding erythema, no warmth, no tenderness, it is about the size of a dime, he reports he just noticed since leaving the office and his podiatrist referred him back to [place] to review this       Assessment/Plan: Please see individual problem list.  DM (diabetes mellitus) type II uncontrolled, periph vascular disorder Check glucose.  Continue current regimen.  Check renal  function.  He will return for an A1c and fasting labs in a month.  Diabetic neuropathy [name] Seems to have developed some symptoms in his fingers.  He is neurologically otherwise intact.  This is consistent with neuropathy.  He will monitor.  I would consider increasing dose of gabapentin if necessary.  Back pain Stable.  Continue tramadol as needed.  Essential hypertension Improved on recheck to 120/72.  He will continue his current regimen.  Callus of foot He will keep his appointment with podiatry.  Hypogonadism in male Discussed referral to urology.  He will let us know if he would like to proceed with this.  Elevated ferritin Recheck ferritin.  Suspect this is related to chronic inflammation.  Diabetic ulcer of left midfoot associated with type 2 diabetes mellitus, limited to breakdown of skin [name] Ulceration noted limited to skin breakdown.  There are no signs of infection.  We will obtain an x-ray to evaluate for underlying bony cause.  Obtain CBC and ESR to evaluate for signs of infection.  If there are signs of infection on lab work would consider placing patient on antibiotics for superficial infection.  Given return precautions.   Health Maintenance: Patient reports pneumonia vaccine about 5 years ago at a prior doctor's office.  He notes he does not remember the name of the physician or the office.  He had a tetanus shot about 2 years ago per his report.  Orders Placed This Encounter  Procedures  . DG Foot Complete Left    Standing Status:   Future    Standing Expiration Date:   04/24/2019    Order Specific Question:   Reason for Exam (SYMPTOM  OR DIAGNOSIS REQUIRED)    Answer:   left foot ulcer laterally, no pain, evaluate for underlying bony disease    Order Specific Question:   Preferred imaging location?    Answer:   [facility] Specific Question:   Radiology Contrast Protocol - do NOT remove file path    Answer:    [email or web address]  . Ferritin  . Basic Metabolic Panel (BMET)  . Hemoglobin A1c    Standing Status:   Future    Standing Expiration Date:   02/24/2019  . Lipid panel    Standing Status:   Future    Standing Expiration Date:   02/24/2019  . Comp Met (CMET)    Standing Status:   Future    Standing Expiration Date:   02/24/2019  . CBC  . Sedimentation rate    No orders of the defined types were placed in this encounter.    [name] Rumps, MD [place]

## 2018-02-26 DIAGNOSIS — E1140 Type 2 diabetes mellitus with diabetic neuropathy, unspecified: Secondary | ICD-10-CM | POA: Diagnosis not present

## 2018-02-26 DIAGNOSIS — B351 Tinea unguium: Secondary | ICD-10-CM | POA: Diagnosis not present

## 2018-02-26 DIAGNOSIS — L97512 Non-pressure chronic ulcer of other part of right foot with fat layer exposed: Secondary | ICD-10-CM | POA: Diagnosis not present

## 2018-02-27 ENCOUNTER — Telehealth: Payer: Self-pay | Admitting: Family Medicine

## 2018-02-27 NOTE — Telephone Encounter (Signed)
Pt dropped off diabetic shoe form to be filled out. Placed in Dr.Sonnenbergs color folder upfront.

## 2018-02-28 NOTE — Telephone Encounter (Signed)
Placed in PCP RED folder.

## 2018-03-02 ENCOUNTER — Other Ambulatory Visit (INDEPENDENT_AMBULATORY_CARE_PROVIDER_SITE_OTHER): Payer: Self-pay

## 2018-03-02 DIAGNOSIS — E1140 Type 2 diabetes mellitus with diabetic neuropathy, unspecified: Secondary | ICD-10-CM | POA: Diagnosis not present

## 2018-03-02 DIAGNOSIS — L97512 Non-pressure chronic ulcer of other part of right foot with fat layer exposed: Secondary | ICD-10-CM | POA: Diagnosis not present

## 2018-03-02 MED ORDER — METOPROLOL SUCCINATE ER 50 MG PO TB24: 50.0000 mg | ORAL_TABLET | Freq: Every day | ORAL | 0 refills | Status: DC

## 2018-03-04 NOTE — Telephone Encounter (Signed)
Signed.  Placed on your desk.  

## 2018-03-06 NOTE — Telephone Encounter (Signed)
Called and spoke with patient to advise form has been faxed and placed to be scanned into patient's chart.

## 2018-03-07 DIAGNOSIS — L97512 Non-pressure chronic ulcer of other part of right foot with fat layer exposed: Secondary | ICD-10-CM | POA: Diagnosis not present

## 2018-03-07 DIAGNOSIS — E1140 Type 2 diabetes mellitus with diabetic neuropathy, unspecified: Secondary | ICD-10-CM | POA: Diagnosis not present

## 2018-03-14 ENCOUNTER — Telehealth: Payer: Self-pay | Admitting: Family Medicine

## 2018-03-14 DIAGNOSIS — E1140 Type 2 diabetes mellitus with diabetic neuropathy, unspecified: Secondary | ICD-10-CM | POA: Diagnosis not present

## 2018-03-14 DIAGNOSIS — L97521 Non-pressure chronic ulcer of other part of left foot limited to breakdown of skin: Secondary | ICD-10-CM | POA: Diagnosis not present

## 2018-03-14 NOTE — Telephone Encounter (Signed)
Placed in RED folder to sign  

## 2018-03-14 NOTE — Telephone Encounter (Signed)
Pt dropped off a cerfification of disability for Tax exclusion to be signed by Dr. Birdie Sons. Paper is up front in color folder. Please call pt when ready.

## 2018-03-15 NOTE — Telephone Encounter (Signed)
Called and spoke with patient. Pt stated that he does not work and has not worked for 0 years and he has been on full disability. Pt can not who last filled this out for him.   Pt stated that the diabetic shoes form was not completed and he will being this back for PCP to complete.

## 2018-03-15 NOTE — Telephone Encounter (Signed)
Noted and reviewed. Please contact the patient and see if he works. Please see the last time he worked if he is not currently working. Is he on disability? Have we filled this form out for him previously?

## 2018-03-16 ENCOUNTER — Telehealth: Payer: Self-pay | Admitting: Family Medicine

## 2018-03-16 NOTE — Telephone Encounter (Signed)
Placed in RED folder to be filled out.

## 2018-03-16 NOTE — Telephone Encounter (Signed)
Pt came in and drop off form that needs to be filled out. And fax to number at bottom of the form. Form is in color folder up front. Thank you!

## 2018-03-17 NOTE — Telephone Encounter (Signed)
Diabetic form signed. Please confirm what he is on disability for and confirm how long he has been on disability. It is noted as 0 years in your prior note.

## 2018-03-17 NOTE — Telephone Encounter (Signed)
This is a different form than the tax exemption form?

## 2018-03-19 NOTE — Telephone Encounter (Signed)
Called patient and left a VM to call back. CRM created and sent to [facility] pool.   For the prior note it should have stated 9 years since he has not worked this was a typo error on my end. I apologize.

## 2018-03-20 ENCOUNTER — Telehealth: Payer: Self-pay | Admitting: *Deleted

## 2018-03-20 NOTE — Telephone Encounter (Signed)
Noted  

## 2018-03-20 NOTE — Telephone Encounter (Signed)
One form was for the tax exemption and the other form was for the diabatic shoes.  Sent to PCP

## 2018-03-20 NOTE — Telephone Encounter (Signed)
Called and spoke with patient he stated that he believes that he has been on disability for at least 9 years pt stated that he has right rotator cuff surgery and had a fall that caused issues with the surgery healing process, suffers with lower back pain, hypertension, diabetes, and neuropathy   The second form was for the diabetic shoes that needed to be faxed to Dr. Alphia Moh @ [employer]   Pt stated that for the tax form will he need to pick this up would like a call once ready.   Sent to PCP

## 2018-03-20 NOTE — Telephone Encounter (Signed)
I called and spoke with patient earlier in regards to forms and paper work that needs to be filled out by Physiological scientist. Spoke with pt in regards to Dr. Purvis Sheffield questions in regards to disability tax paper work and diabetic shoes.

## 2018-03-20 NOTE — Telephone Encounter (Signed)
Copied from CRM [id number]. Topic: Quick Communication - See Telephone Encounter >> Mar 19, 2018 10:31 AM Gracelyn Nurse, [place] wrote: CRM for notification. See Telephone encounter for: 03/19/18. >> Mar 19, 2018 10:53 AM Saverio Danker J wrote: Pt is returning call, no note in crm

## 2018-03-21 DIAGNOSIS — E1140 Type 2 diabetes mellitus with diabetic neuropathy, unspecified: Secondary | ICD-10-CM | POA: Diagnosis not present

## 2018-03-21 DIAGNOSIS — L97521 Non-pressure chronic ulcer of other part of left foot limited to breakdown of skin: Secondary | ICD-10-CM | POA: Diagnosis not present

## 2018-03-22 NOTE — Telephone Encounter (Signed)
Called pt and left a VM to call back. CRM created and sent to [facility] pool. 

## 2018-03-22 NOTE — Telephone Encounter (Signed)
Please let the patient know that I have reviewed the form and that I have to confirm that he is totally and permanently disabled to be able to sign off on the form. It states that being on disability is not evidence of total and permanent disability. Please find out how his medical issues prevent him from obtaining gainful employment and please find out what he did for work previously. I am trying to do my due diligence to ensure that I adequately fill out this form. Thanks.

## 2018-03-23 NOTE — Telephone Encounter (Signed)
Pt came into the office to see if forms were completed. I let the pt know that the doctor has the forms and researching his case,  in order to make sure forms are completed accurately.

## 2018-03-26 NOTE — Telephone Encounter (Signed)
Pt seems upset he doesn't understand why this is so much trouble to fill out. Pt stated that Dr. Birdie Sons should have the paper work that was filled out by his pervious doctor for disability. Pt stated that he will try and reach out to the lawyer who justified him to be disabled to fax [place] information. 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 Pt didn't seem to be able to explain how his disability has affected him from working.   Sent to PCP

## 2018-03-26 NOTE — Telephone Encounter (Signed)
I do not have access to any prior paperwork. I am happy to review any information he can get me, though based on my knowledge of his current medical issues I can not currently check the boxes that indicate that he is totally and permanently disabled. I am happy to refer him to a provider that can do a disability exam to determine if he meets the total and permanent disability criteria. If he would like to do that I can place a referral.

## 2018-03-27 ENCOUNTER — Other Ambulatory Visit (INDEPENDENT_AMBULATORY_CARE_PROVIDER_SITE_OTHER): Payer: Medicare Other

## 2018-03-27 ENCOUNTER — Other Ambulatory Visit (INDEPENDENT_AMBULATORY_CARE_PROVIDER_SITE_OTHER): Payer: Self-pay | Admitting: Vascular Surgery

## 2018-03-27 DIAGNOSIS — E1165 Type 2 diabetes mellitus with hyperglycemia: Secondary | ICD-10-CM | POA: Diagnosis not present

## 2018-03-27 DIAGNOSIS — IMO0002 Reserved for concepts with insufficient information to code with codable children: Secondary | ICD-10-CM

## 2018-03-27 DIAGNOSIS — E785 Hyperlipidemia, unspecified: Secondary | ICD-10-CM | POA: Diagnosis not present

## 2018-03-27 DIAGNOSIS — E1151 Type 2 diabetes mellitus with diabetic peripheral angiopathy without gangrene: Secondary | ICD-10-CM | POA: Diagnosis not present

## 2018-03-27 LAB — COMPREHENSIVE METABOLIC PANEL
ALT: 18 U/L (ref 0–53)
AST: 13 U/L (ref 0–37)
Albumin: 3.9 g/dL (ref 3.5–5.2)
Alkaline Phosphatase: 85 U/L (ref 39–117)
BUN: 15 mg/dL (ref 6–23)
CO2: 28 mEq/L (ref 19–32)
Calcium: 9.8 mg/dL (ref 8.4–10.5)
Chloride: 98 mEq/L (ref 96–112)
Creatinine, Ser: 0.9 mg/dL (ref 0.40–1.50)
GFR: 86.33 mL/min (ref >60.00)
Glucose, Bld: 112 mg/dL — ABNORMAL HIGH (ref 70–99)
Potassium: 4.3 mEq/L (ref 3.5–5.1)
Sodium: 134 mEq/L — ABNORMAL LOW (ref 135–145)
Total Bilirubin: 0.7 mg/dL (ref 0.2–1.2)
Total Protein: 7.2 g/dL (ref 6.0–8.3)

## 2018-03-27 LAB — LIPID PANEL
Cholesterol: 120 mg/dL (ref 0–200)
HDL: 25.4 mg/dL — ABNORMAL LOW (ref >39.00)
NonHDL: 94.42
Total CHOL/HDL Ratio: 5
Triglycerides: 252 mg/dL — ABNORMAL HIGH (ref 0.0–149.0)
VLDL: 50.4 mg/dL — ABNORMAL HIGH (ref 0.0–40.0)

## 2018-03-27 LAB — LDL CHOLESTEROL, DIRECT: Direct LDL: 67 mg/dL

## 2018-03-27 LAB — HEMOGLOBIN A1C: Hgb A1c MFr Bld: 7.6 % — ABNORMAL HIGH (ref 4.6–6.5)

## 2018-03-27 NOTE — Telephone Encounter (Signed)
Patient says to give him a little time and he will get the records for his disability.

## 2018-03-27 NOTE — Telephone Encounter (Signed)
Noted. I will await his records.

## 2018-03-28 NOTE — Telephone Encounter (Signed)
All further refills for this medications should come from his PCP

## 2018-03-28 NOTE — Telephone Encounter (Signed)
Patient request refill of this Rx. Please advise.

## 2018-04-02 ENCOUNTER — Other Ambulatory Visit (INDEPENDENT_AMBULATORY_CARE_PROVIDER_SITE_OTHER): Payer: Self-pay | Admitting: Vascular Surgery

## 2018-04-03 NOTE — Telephone Encounter (Signed)
Form has been placed in green folder at [facility] desk until requested information has been received.

## 2018-04-03 NOTE — Telephone Encounter (Signed)
Should this refill be sent to PCP?

## 2018-04-03 NOTE — Telephone Encounter (Signed)
I think this was a historical refill from when he was discharged after angio.  He should have this sent to his PCP for refill

## 2018-04-04 DIAGNOSIS — L97521 Non-pressure chronic ulcer of other part of left foot limited to breakdown of skin: Secondary | ICD-10-CM | POA: Diagnosis not present

## 2018-04-04 DIAGNOSIS — E1140 Type 2 diabetes mellitus with diabetic neuropathy, unspecified: Secondary | ICD-10-CM | POA: Diagnosis not present

## 2018-04-04 DIAGNOSIS — L851 Acquired keratosis [keratoderma] palmaris et plantaris: Secondary | ICD-10-CM | POA: Diagnosis not present

## 2018-04-06 ENCOUNTER — Other Ambulatory Visit: Payer: Self-pay | Admitting: Family Medicine

## 2018-04-06 NOTE — Telephone Encounter (Signed)
Requested medication (s) are due for refill today:  Yes   Requested medication (s) are on the active medication list:  yes  Future visit scheduled:  yes  Last Refill: 1/10/20l #30; RF 0; ordered per Dr. Dew/ recommended next refill needs to be ordered by PCP.   Requested Prescriptions  Pending Prescriptions Disp Refills   Metoprolol Succinate 50 MG CS24 30 capsule 0    Sig: Take 1 tablet by mouth daily. All future refills to come from primary care provider     Cardiovascular:  Beta Blockers Passed - 04/06/2018  2:30 PM      Passed - Last BP in normal range    BP Readings from Last 1 Encounters:  02/23/18 136/72         Passed - Last Heart Rate in normal range    Pulse Readings from Last 1 Encounters:  02/23/18 79         Passed - Valid encounter within last 6 months    Recent Outpatient Visits          1 month ago DM (diabetes mellitus) type II uncontrolled, periph vascular disorder [name]   [facility] [place], Yehuda Mao, MD   3 months ago DM (diabetes mellitus) type II uncontrolled, periph vascular disorder [facility])   [facility] [place], Yehuda Mao, MD   11 months ago Decreased energy   [facility] Glori Luis, MD   1 year ago Viral illness   [facility] [place], Yehuda Mao, MD   1 year ago DM (diabetes mellitus) type II uncontrolled, periph vascular disorder [facility])   [facility] [place], Yehuda Mao, MD      Future Appointments            In 2 months Sonnenberg, Yehuda Mao, MD [facility] [place], [facility]   In 10 months O'Brien-Blaney, Vivianne Spence, LPN [place] [facility] [place], [facility]   In 10 months Birdie Sons, Yehuda Mao, MD [facility]

## 2018-04-06 NOTE — Telephone Encounter (Signed)
Copied from CRM [id number]. Topic: Quick Communication - Rx Refill/Question >> Apr 06, 2018  2:11 PM Jolayne Haines L wrote: Medication:  Metoprolol Succinate 50 mg   Has the patient contacted their pharmacy? Yes they sent to the wrong office. Please advise.  (Agent: If no, request that the patient contact the pharmacy for the refill.) (Agent: If yes, when and what did the pharmacy advise?)  Preferred Pharmacy (with phone number or street name): [id number] [place] [place] - [address] [place] [address]    Agent: Please be advised that RX refills may take up to 3 business days. We ask that you follow-up with your pharmacy.

## 2018-04-09 ENCOUNTER — Ambulatory Visit (INDEPENDENT_AMBULATORY_CARE_PROVIDER_SITE_OTHER): Payer: Medicare Other

## 2018-04-09 ENCOUNTER — Encounter (INDEPENDENT_AMBULATORY_CARE_PROVIDER_SITE_OTHER): Payer: Self-pay | Admitting: Vascular Surgery

## 2018-04-09 ENCOUNTER — Ambulatory Visit (INDEPENDENT_AMBULATORY_CARE_PROVIDER_SITE_OTHER): Payer: Medicare Other | Admitting: Vascular Surgery

## 2018-04-09 VITALS — BP 99/67 | HR 69 | Resp 17 | Ht 76.0 in | Wt 303.0 lb

## 2018-04-09 DIAGNOSIS — Z95820 Peripheral vascular angioplasty status with implants and grafts: Secondary | ICD-10-CM

## 2018-04-09 DIAGNOSIS — I739 Peripheral vascular disease, unspecified: Secondary | ICD-10-CM

## 2018-04-09 DIAGNOSIS — F17200 Nicotine dependence, unspecified, uncomplicated: Secondary | ICD-10-CM

## 2018-04-09 DIAGNOSIS — F17210 Nicotine dependence, cigarettes, uncomplicated: Secondary | ICD-10-CM

## 2018-04-09 DIAGNOSIS — I10 Essential (primary) hypertension: Secondary | ICD-10-CM | POA: Diagnosis not present

## 2018-04-09 MED ORDER — METOPROLOL SUCCINATE ER 50 MG PO TB24: 50.0000 mg | ORAL_TABLET | Freq: Every day | ORAL | 0 refills | Status: DC

## 2018-04-09 NOTE — Telephone Encounter (Signed)
Pt following up on this request for  metoprolol succinate (TOPROL XL) 50 MG 24 hr tablet  Pt saw Vein and vascular this am who filled a 30 day for him so he would not be without, but instructed pt he needs to get future refills from his pcp.  Pt was prescribed this in the hospital and Dr Antony Haste has never filled for the pt.  Pt would like Dr Antony Haste to send in a 90 day supply to  [id number] [place] [place] - [address] [phone] (Phone) [phone] (Fax)    He picked up 30 day today

## 2018-04-09 NOTE — Progress Notes (Signed)
Subjective:    Patient ID: Joseph Singh, male    DOB: [birth year], 58 y.o.   MRN: [id number] Chief Complaint  Patient presents with  . Follow-up    16monthultrasound follow up   Presents for a 637-montheripheral artery disease.  The patient has a known history of claudication-like symptoms requiring multiple endovascular interventions to the bilateral lower extremity.  The patient presents today without complaint.  The patient denies any claudication-like symptoms, rest pain or ulcer formation to the bilateral legs.  The patient does continue to actively smoke tobacco.  The patient continues to take aspirin, Lipitor and Plavix on a daily basis.  Patient notes that he is out of his metoprolol and has been unable to receive a refill from his primary care doctor.  Patient is asking if I can please call in a refill until his primary care office is able to refill his prescription.  Patient underwent a bilateral ABI / arterial duplex which was notable for: Right: 1.17, (previous 1.04), biphasic tibials with normal great toe waveforms Left: 0.88, (previous 0.92), monophasic anterior tibial, triphasic posterior tibial monophasic peroneal with mildly dampened great toe waveforms. When compared to the previous ABI and bilateral lower extremity arterial duplex the patient's arterial patency is stable.  The patient denies any fever, nausea vomiting.  Review of Systems  Constitutional: Negative.   HENT: Negative.   Eyes: Negative.   Respiratory: Negative.   Cardiovascular: Negative.   Gastrointestinal: Negative.   Endocrine: Negative.   Genitourinary: Negative.   Musculoskeletal: Negative.   Skin: Negative.   Allergic/Immunologic: Negative.   Neurological: Negative.   Hematological: Negative.   Psychiatric/Behavioral: Negative.       Objective:   Physical Exam Vitals signs reviewed.  Constitutional:      Appearance: Normal appearance. He is obese.  HENT:     Head: Normocephalic and  atraumatic.     Right Ear: External ear normal.     Left Ear: External ear normal.     Nose: Nose normal.     Mouth/Throat:     Mouth: Mucous membranes are moist.     Pharynx: Oropharynx is clear.  Eyes:     Extraocular Movements: Extraocular movements intact.     Conjunctiva/sclera: Conjunctivae normal.     Pupils: Pupils are equal, round, and reactive to light.  Neck:     Musculoskeletal: Normal range of motion.  Cardiovascular:     Rate and Rhythm: Normal rate and regular rhythm.     Pulses: Normal pulses.     Comments: Hard to palpate pedal pulses however the bilateral feet are warm.  There is a good capillary refill to the toes. Pulmonary:     Effort: Pulmonary effort is normal.     Breath sounds: Normal breath sounds.  Musculoskeletal: Normal range of motion.        General: No swelling.  Skin:    General: Skin is warm and dry.  Neurological:     General: No focal deficit present.     Mental Status: He is alert and oriented to person, place, and time. Mental status is at baseline.  Psychiatric:        Mood and Affect: Mood normal.        Behavior: Behavior normal.        Thought Content: Thought content normal.        Judgment: Judgment normal.    BP 99/67 (BP Location: Left Arm)   Pulse 69   Resp 17  Ht 6' 4" (1.93 m)   Wt (!) 303 lb (137.4 kg)   BMI 36.88 kg/m   Past Medical History:  Diagnosis Date  . Arthritis   . Diabetes mellitus without complication ([place])   . History of kidney stones   . Hyperlipidemia   . Hypertension   . Neuropathy    Social History   Socioeconomic History  . Marital status: Married    Spouse name: Not on file  . Number of children: Not on file  . Years of education: Not on file  . Highest education level: Not on file  Occupational History  . Not on file  Social Needs  . Financial resource strain: Not hard at all  . Food insecurity:    Worry: Never true    Inability: Never true  . Transportation needs:    Medical: No      Non-medical: No  Tobacco Use  . Smoking status: Current Every Day Smoker    Packs/day: 0.50    Years: 35.00    Pack years: 17.50    Types: Cigarettes  . Smokeless tobacco: Never Used  Substance and Sexual Activity  . Alcohol use: No    Alcohol/week: 0.0 standard drinks  . Drug use: No  . Sexual activity: Yes    Partners: Female    Comment: Wife  Lifestyle  . Physical activity:    Days per week: 3 days    Minutes per session: 20 min  . Stress: Not at all  Relationships  . Social connections:    Talks on phone: Not on file    Gets together: Not on file    Attends religious service: Not on file    Active member of club or organization: Not on file    Attends meetings of clubs or organizations: Not on file    Relationship status: Married  . Intimate partner violence:    Fear of current or ex partner: No    Emotionally abused: No    Physically abused: No    Forced sexual activity: No  Other Topics Concern  . Not on file  Social History Narrative   Permanently disabled   [place] on the job and hurt his shoulder    Lives with wife and 2 children    1 grandchild from a previous marriage    Pets: Not inside    GED    Right handed    Caffeine- 2-3 sodas, tea, no coffee    Enjoys watching tv and being outside    Past Surgical History:  Procedure Laterality Date  . LOWER EXTREMITY ANGIOGRAPHY Right 02/25/2016   Procedure: Lower Extremity Angiography;  Surgeon: Algernon Huxley, MD;  Location: [facility];  Service: Cardiovascular;  Laterality: Right;  . LOWER EXTREMITY ANGIOGRAPHY Right 02/27/2017   Procedure: LOWER EXTREMITY ANGIOGRAPHY;  Surgeon: Algernon Huxley, MD;  Location: [facility];  Service: Cardiovascular;  Laterality: Right;  . LOWER EXTREMITY INTERVENTION  02/25/2016   Procedure: Lower Extremity Intervention;  Surgeon: Algernon Huxley, MD;  Location: [facility];  Service: Cardiovascular;;  . TOE AMPUTATION     Family History  Problem Relation Age  of Onset  . Diabetes Mother   . Heart disease Father   . Diabetes Sister   . Diabetes Brother   . Cancer Sister        lung  . Cancer Sister        breast   No Known Allergies  Assessment & Plan:  Presents for a 6-month peripheral artery disease.  The patient has a known history of claudication-like symptoms requiring multiple endovascular interventions to the bilateral lower extremity.  The patient presents today without complaint.  The patient denies any claudication-like symptoms, rest pain or ulcer formation to the bilateral legs.  The patient does continue to actively smoke tobacco.  The patient continues to take aspirin, Lipitor and Plavix on a daily basis.  Patient notes that he is out of his metoprolol and has been unable to receive a refill from his primary care doctor.  Patient is asking if I can please call in a refill until his primary care office is able to refill his prescription.  Patient underwent a bilateral ABI / arterial duplex which was notable for: Right: 1.17, (previous 1.04), biphasic tibials with normal great toe waveforms Left: 0.88, (previous 0.92), monophasic anterior tibial, triphasic posterior tibial monophasic peroneal with mildly dampened great toe waveforms. When compared to the previous ABI and bilateral lower extremity arterial duplex the patient's arterial patency is stable.  The patient denies any fever, nausea vomiting.  1. Peripheral vascular disease [name] - Stable Patient with known peripheral artery disease to the bilateral lower extremity requiring multiple endovascular interventions The patient presents today asymptomatically Bilateral ABI and lower extremity arterial duplex are stable. Physical exam is stable No indication for endovascular intervention at this time See the patient back in 6 months to continue to visit fail his peripheral artery disease I have discussed with the patient at length the risk factors for and pathogenesis of  atherosclerotic disease and encouraged a healthy diet, regular exercise regimen and blood pressure / glucose control.  The patient was encouraged to call the office in the interim if he experiences any claudication like symptoms, rest pain or ulcers to his feet / toes.  - VAS US ABI WITH/WO TBI; Future - VAS US LOWER EXTREMITY ARTERIAL DUPLEX; Future  2. Essential hypertension - Stable Patient is currently run out of his metoprolol prescription Have called in a 1 month supply in order for the patient to contact his primary physician to continue his medication Encouraged good control as its slows the progression of atherosclerotic disease  3. Tobacco use disorder - Stable We had a discussion for approximately five minutes regarding the absolute need for smoking cessation due to the deleterious nature of tobacco on the vascular system. We discussed the tobacco use would diminish patency of any intervention, and likely significantly worsen progressio of disease. We discussed multiple agents for quitting including replacement therapy or medications to reduce cravings such as Chantix. The patient voices their understanding of the importance of smoking cessation.  Current Outpatient Medications on File Prior to Visit  Medication Sig Dispense Refill  . amLODipine (NORVASC) 10 MG tablet TAKE ONE (1) TABLET EACH DAY 90 tablet 3  . aspirin EC 81 MG tablet Take 1 tablet (81 mg total) by mouth daily. 150 tablet 2  . atorvastatin (LIPITOR) 40 MG tablet TAKE ONE (1) TABLET EACH DAY 90 tablet 3  . blood glucose meter kit and supplies KIT E11.51, check 2x daily, dispense based on patient and insurance preference 1 each 0  . clopidogrel (PLAVIX) 75 MG tablet TAKE ONE (1) TABLET EACH DAY 90 tablet 3  . cyclobenzaprine (FLEXERIL) 10 MG tablet TAKE ONE TABLET BY MOUTH 3 TIMES DAILY AS NEEDED FOR MUSCLE SPASMS. 30 tablet 1  . empagliflozin (JARDIANCE) 25 MG TABS tablet Take 25 mg by mouth daily. 180 tablet   1  .  gabapentin (NEURONTIN) 300 MG capsule Take 1 capsule (300 mg total) by mouth 4 (four) times daily. 360 capsule 0  . glipiZIDE (GLUCOTROL XL) 10 MG 24 hr tablet TAKE ONE (1) TABLET EACH DAY WITH BREAKFAST 90 tablet 3  . glucose blood (ONE TOUCH ULTRA TEST) test strip Check blood sugar twice daily Dx code: E11.9 200 each 0  . linagliptin (TRADJENTA) 5 MG TABS tablet Take 1 tablet (5 mg total) by mouth daily. 30 tablet 3  . lisinopril-hydrochlorothiazide (PRINZIDE,ZESTORETIC) 20-25 MG tablet TAKE ONE (1) TABLET BY MOUTH EVERY DAY 90 tablet 3  . metFORMIN (GLUCOPHAGE) 1000 MG tablet TAKE 1 TABLET BY MOUTH TWICE DAILY WITH A MEAL 180 tablet 3  . Metoprolol Succinate 50 MG CS24 Take 1 tablet by mouth daily. All future refills to come from primary care provider 30 capsule 0  . traMADol (ULTRAM) 50 MG tablet TAKE 1 TABLET BY MOUTH THREE TIMES A DAY 90 tablet 1  . Vitamin D, Ergocalciferol, (DRISDOL) 50000 units CAPS capsule Take 1 capsule (50,000 Units total) by mouth every 7 (seven) days. 8 capsule 0   No current facility-administered medications on file prior to visit.    There are no Patient Instructions on file for this visit. No follow-ups on file.  KIMBERLY A STEGMAYER, PA-C   

## 2018-04-10 MED ORDER — METOPROLOL SUCCINATE ER 50 MG PO TB24: 50.0000 mg | ORAL_TABLET | Freq: Every day | ORAL | 1 refills | Status: DC

## 2018-04-10 NOTE — Telephone Encounter (Signed)
Sent to pharmacy 

## 2018-04-19 ENCOUNTER — Other Ambulatory Visit: Payer: Self-pay | Admitting: Family Medicine

## 2018-04-26 ENCOUNTER — Other Ambulatory Visit: Payer: Self-pay | Admitting: Family Medicine

## 2018-04-26 ENCOUNTER — Telehealth: Payer: Self-pay | Admitting: Family Medicine

## 2018-04-26 DIAGNOSIS — G629 Polyneuropathy, unspecified: Secondary | ICD-10-CM

## 2018-04-26 DIAGNOSIS — G8929 Other chronic pain: Secondary | ICD-10-CM

## 2018-04-26 DIAGNOSIS — M5442 Lumbago with sciatica, left side: Principal | ICD-10-CM

## 2018-04-26 MED ORDER — GABAPENTIN 300 MG PO CAPS: 300.0000 mg | ORAL_CAPSULE | Freq: Four times a day (QID) | ORAL | 2 refills | Status: DC

## 2018-04-26 MED ORDER — TRAMADOL HCL 50 MG PO TABS: 50.0000 mg | ORAL_TABLET | Freq: Three times a day (TID) | ORAL | 0 refills | Status: DC

## 2018-04-26 NOTE — Telephone Encounter (Signed)
Tramadol refill sent  [place] PMP reviewed and is appropriate

## 2018-04-26 NOTE — Telephone Encounter (Signed)
Requested medication (s) are due for refill today: Yes  Requested medication (s) are on the active medication list: Yes  Last refill:  02/22/18  Future visit scheduled: Yes  Notes to clinic:  Unable to refill per protocol.     Requested Prescriptions  Pending Prescriptions Disp Refills   traMADol (ULTRAM) 50 MG tablet 90 tablet 1    Sig: Take 1 tablet (50 mg total) by mouth 3 (three) times daily.     Not Delegated - Analgesics:  Opioid Agonists Failed - 04/26/2018  9:18 AM      Failed - This refill cannot be delegated      Failed - Urine Drug Screen completed in last 360 days.      Passed - Valid encounter within last 6 months    Recent Outpatient Visits          2 months ago DM (diabetes mellitus) type II uncontrolled, periph vascular disorder [name]   [facility], Yehuda Mao, MD   4 months ago DM (diabetes mellitus) type II uncontrolled, periph vascular disorder [facility])   [facility] [place] Glori Luis, MD   11 months ago Decreased energy   [facility] Glori Luis, MD   1 year ago Viral illness   [facility] [place], Yehuda Mao, MD   1 year ago DM (diabetes mellitus) type II uncontrolled, periph vascular disorder [facility])   [facility] [place] Birdie Sons, Yehuda Mao, MD      Future Appointments            In 2 months Birdie Sons, Yehuda Mao, MD [place] [facility] [facility] [place], [facility]   In 9 months O'Brien-Blaney, Denisa L, LPN [place] [facility] [place], [facility]   In 9 months Birdie Sons, Yehuda Mao, MD [place] [facility] [place], [facility]         Signed Prescriptions Disp Refills   gabapentin (NEURONTIN) 300 MG capsule 360 capsule 2    Sig: Take 1 capsule (300 mg total) by mouth 4 (four) times daily.     Neurology: Anticonvulsants - gabapentin Passed - 04/26/2018  9:18 AM      Passed - Valid encounter within last 12 months    Recent Outpatient Visits          2 months ago DM  (diabetes mellitus) type II uncontrolled, periph vascular disorder [name]   [facility] [place], Yehuda Mao, MD   4 months ago DM (diabetes mellitus) type II uncontrolled, periph vascular disorder [facility])   [place] [place] Glori Luis, MD   11 months ago Decreased energy   [facility] Glori Luis, MD   1 year ago Viral illness   [facility] [place], Yehuda Mao, MD   1 year ago DM (diabetes mellitus) type II uncontrolled, periph vascular disorder [facility])   [place] [facility] [place], Yehuda Mao, MD      Future Appointments            In 2 months Sonnenberg, Yehuda Mao, MD [place] [facility] [place], [facility]   In 9 months O'Brien-Blaney, Vivianne Spence, LPN [place] [facility] [place], [facility]   In 9 months Birdie Sons, Yehuda Mao, MD [facility], [facility]

## 2018-04-26 NOTE — Telephone Encounter (Addendum)
Copied from CRM [id number]. Topic: Quick Communication - See Telephone Encounter >> Apr 26, 2018  8:52 AM Trula Slade wrote: CRM for notification. See Telephone encounter for: 04/26/18. Patient would like refills on the following medications and have [facility] [place]. Patient is completely out of the Tramadol medications. Pt would like for the nurse who processes this refill request to call him.  1)  traMADol (ULTRAM) 50 MG tablet   2)  gabapentin (NEURONTIN) 300 MG capsule

## 2018-04-26 NOTE — Telephone Encounter (Signed)
Pt needs refills on the following; traMADol (ULTRAM) 50 MG tablet , took the last pill today and gabapentin (NEURONTIN) 300 MG capsule. [facility]

## 2018-04-26 NOTE — Telephone Encounter (Signed)
Called and spoke with pt. Pt advised and voiced understanding.  

## 2018-04-26 NOTE — Telephone Encounter (Signed)
Last OV 02/23/2018   Tramadol last refilled 02/22/2018 disp 90 with 1 refill   Gabapentin last refilled 04/26/2018   Called and spoke with pt. Pt picked up the gabapentin today already and took his last pill of the tramadol. Pt would like this refilled before PCP returns.   Sent to [place] [employer] covering NP.   [facility] done.   Thanks

## 2018-04-26 NOTE — Telephone Encounter (Signed)
Sent to [employer] for approval pt is all out and needs this refilled before Dr. Birdie Sons returns.

## 2018-05-16 DIAGNOSIS — L851 Acquired keratosis [keratoderma] palmaris et plantaris: Secondary | ICD-10-CM | POA: Diagnosis not present

## 2018-05-16 DIAGNOSIS — B353 Tinea pedis: Secondary | ICD-10-CM | POA: Diagnosis not present

## 2018-05-16 DIAGNOSIS — I70213 Atherosclerosis of native arteries of extremities with intermittent claudication, bilateral legs: Secondary | ICD-10-CM | POA: Diagnosis not present

## 2018-05-16 DIAGNOSIS — E1140 Type 2 diabetes mellitus with diabetic neuropathy, unspecified: Secondary | ICD-10-CM | POA: Diagnosis not present

## 2018-05-16 DIAGNOSIS — B351 Tinea unguium: Secondary | ICD-10-CM | POA: Diagnosis not present

## 2018-05-21 ENCOUNTER — Other Ambulatory Visit: Payer: Self-pay | Admitting: Family Medicine

## 2018-05-21 DIAGNOSIS — G8929 Other chronic pain: Secondary | ICD-10-CM

## 2018-05-21 DIAGNOSIS — M5442 Lumbago with sciatica, left side: Principal | ICD-10-CM

## 2018-05-23 ENCOUNTER — Telehealth: Payer: Self-pay | Admitting: Family Medicine

## 2018-05-23 NOTE — Telephone Encounter (Signed)
Copied from CRM [id number]. Topic: Quick Communication - Rx Refill/Question >> May 23, 2018  2:00 PM Richarda Blade wrote: Medication: traMADol (ULTRAM) 50 MG tablet   Has the patient contacted their pharmacy? No. (Agent: If no, request that the patient contact the pharmacy for the refill.) The patient will be completely out of medication by Saturday  Preferred Pharmacy (with phone number or street name): [id number] [place] - [address] [phone] (Phone) [phone] (Fax)    Agent: Please be advised that RX refills may take up to 3 business days. We ask that you follow-up with your pharmacy.

## 2018-05-23 NOTE — Telephone Encounter (Signed)
Last OV 02/23/2018   Last refilled 05/26/2018 disp 90 with no refills   Next OV 07/02/2018

## 2018-05-23 NOTE — Telephone Encounter (Signed)
Called and spoke with pt and advised Rx does look like it was sent and can be filled on 05/26/2018 advised pt to call the pharmacy to make sure they have this if not I advised pt to call me back at [phone].

## 2018-06-05 ENCOUNTER — Other Ambulatory Visit: Payer: Self-pay | Admitting: Family Medicine

## 2018-06-12 ENCOUNTER — Encounter (INDEPENDENT_AMBULATORY_CARE_PROVIDER_SITE_OTHER): Payer: Self-pay

## 2018-06-14 ENCOUNTER — Other Ambulatory Visit: Payer: Self-pay

## 2018-06-14 ENCOUNTER — Ambulatory Visit
Admission: RE | Admit: 2018-06-14 | Discharge: 2018-06-14 | Disposition: A | Discharge status: Home / Self Care | Payer: Medicare Other | Source: Ambulatory Visit | Attending: Family Medicine | Admitting: Family Medicine

## 2018-06-14 ENCOUNTER — Ambulatory Visit (INDEPENDENT_AMBULATORY_CARE_PROVIDER_SITE_OTHER): Payer: Medicare Other | Admitting: Family Medicine

## 2018-06-14 ENCOUNTER — Ambulatory Visit: Payer: Self-pay | Admitting: Family Medicine

## 2018-06-14 ENCOUNTER — Encounter: Payer: Self-pay | Admitting: Family Medicine

## 2018-06-14 DIAGNOSIS — R600 Localized edema: Secondary | ICD-10-CM

## 2018-06-14 DIAGNOSIS — L03116 Cellulitis of left lower limb: Secondary | ICD-10-CM | POA: Diagnosis not present

## 2018-06-14 DIAGNOSIS — I739 Peripheral vascular disease, unspecified: Secondary | ICD-10-CM | POA: Insufficient documentation

## 2018-06-14 DIAGNOSIS — Z8679 Personal history of other diseases of the circulatory system: Secondary | ICD-10-CM | POA: Diagnosis not present

## 2018-06-14 DIAGNOSIS — Z86718 Personal history of other venous thrombosis and embolism: Secondary | ICD-10-CM

## 2018-06-14 MED ORDER — CEPHALEXIN 500 MG PO CAPS: 500.0000 mg | ORAL_CAPSULE | Freq: Four times a day (QID) | ORAL | 0 refills | Status: DC

## 2018-06-14 NOTE — Telephone Encounter (Signed)
Pt called Joseph Singh Pt. Reports 4-5 days ago left ankle began swelling and is red. Has pain behind his knee as well. States he has stents in both legs and is concerned about the stent. No chest pain or shortness of breath. No fever. Has contacted vein and vascular doctor also and has not heard back. Spoke with Clydie Braun in the practice and will send triage for review

## 2018-06-14 NOTE — Telephone Encounter (Signed)
Pt. Reports 4-5 days ago left ankle began swelling and is red. Has pain behind his knee as well. States he has stents in both legs and is concerned about the stent. No chest pain or shortness of breath. No fever. Has contacted vein and vascular doctor also and has not heard back. Spoke with Clydie Braun in the practice and will send triage for review. Answer Assessment - Initial Assessment Questions 1. ONSET: "When did the swelling start?" (e.g., minutes, hours, days)     Started 4-5 days ago 2. LOCATION: "What part of the leg is swollen?"  "Are both legs swollen or just one leg?"     Ankle is red and swelling 3. SEVERITY: "How bad is the swelling?" (e.g., localized; mild, moderate, severe)  - Localized - small area of swelling localized to one leg  - MILD pedal edema - swelling limited to foot and ankle, pitting edema < 1/4 inch (6 mm) deep, rest and elevation eliminate most or all swelling  - MODERATE edema - swelling of lower leg to knee, pitting edema > 1/4 inch (6 mm) deep, rest and elevation only partially reduce swelling  - SEVERE edema - swelling extends above knee, facial or hand swelling present      Mild-moderate 4. REDNESS: "Does the swelling look red or infected?"     Redness 5. PAIN: "Is the swelling painful to touch?" If so, ask: "How painful is it?"   (Scale 1-10; mild, moderate or severe)     3 with Tramadol 6. FEVER: "Do you have a fever?" If so, ask: "What is it, how was it measured, and when did it start?"      No 7. CAUSE: "What do you think is causing the leg swelling?"     Maybe my stent 8. MEDICAL HISTORY: "Do you have a history of heart failure, kidney disease, liver failure, or cancer?"     Yes 9. RECURRENT SYMPTOM: "Have you had leg swelling before?" If so, ask: "When was the last time?" "What happened that time?"     Yes 10. OTHER SYMPTOMS: "Do you have any other symptoms?" (e.g., chest pain, difficulty breathing)       No 11. PREGNANCY: "Is there any chance you are  pregnant?" "When was your last menstrual period?"       n/a  Protocols used: LEG SWELLING AND EDEMA-A-AH

## 2018-06-14 NOTE — Telephone Encounter (Signed)
Pt scheduled for Doxy visit today at 10:40am

## 2018-06-14 NOTE — Progress Notes (Signed)
Patient ID: Joseph Singh, male   DOB: 06/06/1960, 58 y.o.   MRN: [id number]  Virtual Visit via video Note  This visit type was conducted due to national recommendations for restrictions regarding the COVID-19 pandemic (e.g. social distancing).  This format is felt to be most appropriate for this patient at this time.  All issues noted in this document were discussed and addressed.  No physical exam was performed (except for noted visual exam findings with Video Visits).   I connected with Joseph Singh on 06/14/18 at 10:40 AM EDT by a video enabled telemedicine application and verified that I am speaking with the correct person using two identifiers. Location patient: home Location provider: work or home office Persons participating in the virtual visit: patient, provider  I discussed the limitations, risks, security and privacy concerns of performing an evaluation and management service by video and the availability of in person appointments. I also discussed with the patient that there may be a patient responsible charge related to this service. The patient expressed understanding and agreed to proceed.   HPI: Patient is being connected via video due to complaints of pain and swelling in the left leg that is been present for 1 to 2 days.  Patient began to notice his left foot becoming a little swollen yesterday, but did not think too much of it.  Upon waking this morning he noticed his left foot and slightly up into left ankle/lower leg he was having swelling as well as skin redness.  Patient became concerned because he has a history of blood clots and stenting in the vessels of right leg.  Currently he does not take any blood thinner such as Coumadin, Eliquis or Xarelto.  He is on Plavix.  He does follow regularly with vascular surgery due to peripheral vascular disease as well as multiple other comorbidities that contribute to vascular disease such as hypertension and diabetes.  Denies fever or  chills.  Denies cough, shortness of breath or wheezing.  Denies chest pain or palpitations.  Denies GI or GU issues.  He is able to walk normally and put full weight on left leg.   ROS: See pertinent positives and negatives per HPI.  Past Medical History:  Diagnosis Date  . Arthritis   . Diabetes mellitus without complication ([place])   . History of kidney stones   . Hyperlipidemia   . Hypertension   . Neuropathy     Past Surgical History:  Procedure Laterality Date  . LOWER EXTREMITY ANGIOGRAPHY Right 02/25/2016   Procedure: Lower Extremity Angiography;  Surgeon: Algernon Huxley, MD;  Location: [facility];  Service: Cardiovascular;  Laterality: Right;  . LOWER EXTREMITY ANGIOGRAPHY Right 02/27/2017   Procedure: LOWER EXTREMITY ANGIOGRAPHY;  Surgeon: Algernon Huxley, MD;  Location: [facility];  Service: Cardiovascular;  Laterality: Right;  . LOWER EXTREMITY INTERVENTION  02/25/2016   Procedure: Lower Extremity Intervention;  Surgeon: Algernon Huxley, MD;  Location: [facility];  Service: Cardiovascular;;  . TOE AMPUTATION      Family History  Problem Relation Age of Onset  . Diabetes Mother   . Heart disease Father   . Diabetes Sister   . Diabetes Brother   . Cancer Sister        lung  . Cancer Sister        breast   Social History   Tobacco Use  . Smoking status: Current Every Day Smoker    Packs/day: 0.50  Years: 35.00    Pack years: 17.50    Types: Cigarettes  . Smokeless tobacco: Never Used  Substance Use Topics  . Alcohol use: No    Alcohol/week: 0.0 standard drinks    Current Outpatient Medications:  .  amLODipine (NORVASC) 10 MG tablet, TAKE ONE (1) TABLET EACH DAY, Disp: 90 tablet, Rfl: 3 .  aspirin EC 81 MG tablet, Take 1 tablet (81 mg total) by mouth daily., Disp: 150 tablet, Rfl: 2 .  atorvastatin (LIPITOR) 40 MG tablet, TAKE ONE (1) TABLET EACH DAY, Disp: 90 tablet, Rfl: 3 .  blood glucose meter kit and supplies KIT, E11.51, check 2x  daily, dispense based on patient and insurance preference, Disp: 1 each, Rfl: 0 .  clopidogrel (PLAVIX) 75 MG tablet, TAKE ONE (1) TABLET EACH DAY, Disp: 90 tablet, Rfl: 3 .  cyclobenzaprine (FLEXERIL) 10 MG tablet, TAKE ONE TABLET BY MOUTH 3 TIMES DAILY AS NEEDED FOR MUSCLE SPASMS., Disp: 30 tablet, Rfl: 1 .  empagliflozin (JARDIANCE) 25 MG TABS tablet, Take 25 mg by mouth daily., Disp: 180 tablet, Rfl: 1 .  gabapentin (NEURONTIN) 300 MG capsule, Take 1 capsule (300 mg total) by mouth 4 (four) times daily., Disp: 360 capsule, Rfl: 2 .  glipiZIDE (GLUCOTROL XL) 10 MG 24 hr tablet, TAKE ONE (1) TABLET EACH DAY WITH BREAKFAST, Disp: 90 tablet, Rfl: 3 .  glucose blood (ONE TOUCH ULTRA TEST) test strip, Check blood sugar twice daily Dx code: E11.9, Disp: 200 each, Rfl: 0 .  lisinopril-hydrochlorothiazide (PRINZIDE,ZESTORETIC) 20-25 MG tablet, TAKE 1 TABLET BY MOUTH EVERY DAY, Disp: 90 tablet, Rfl: 3 .  metFORMIN (GLUCOPHAGE) 1000 MG tablet, TAKE 1 TABLET BY MOUTH TWICE DAILY WITH A MEAL, Disp: 180 tablet, Rfl: 3 .  metoprolol succinate (TOPROL-XL) 50 MG 24 hr tablet, Take 1 tablet (50 mg total) by mouth daily., Disp: 90 tablet, Rfl: 1 .  TRADJENTA 5 MG TABS tablet, TAKE 1 TABLET BY MOUTH EVERY DAY, Disp: 30 tablet, Rfl: 3 .  traMADol (ULTRAM) 50 MG tablet, TAKE 1 TABLET BY MOUTH THREE TIMES A DAY, Disp: 90 tablet, Rfl: 0 .  Vitamin D, Ergocalciferol, (DRISDOL) 50000 units CAPS capsule, Take 1 capsule (50,000 Units total) by mouth every 7 (seven) days., Disp: 8 capsule, Rfl: 0 .  cephALEXin (KEFLEX) 500 MG capsule, Take 1 capsule (500 mg total) by mouth 4 (four) times daily., Disp: 40 capsule, Rfl: 0  EXAM:  GENERAL: alert, oriented, appears well and in no acute distress  HEENT: atraumatic, conjunttiva clear, no obvious abnormalities on inspection of external nose and ears  NECK: normal movements of the head and neck  LUNGS: on inspection no signs of respiratory distress, breathing rate appears  normal, no obvious gross SOB, gasping or wheezing  CV: no obvious cyanosis.  Patient's left foot and into left ankle do appear somewhat swollen.  I had the patient push into skin of left foot and ankle to see if he had pitting edema, mild pitting that I would rate at a +1. Capillary refill appears to be less than 2 seconds.  Skin: Redness of skin around left ankle, appears to be consistent with a possible cellulitis.  MS: moves all visible extremities without noticeable abnormality  PSYCH/NEURO: pleasant and cooperative, no obvious depression or anxiety, speech and thought processing grossly intact  ASSESSMENT AND PLAN:  Discussed the following assessment and plan:  Lower extremity edema - Plan: US Venous Img Lower Unilateral Left  Peripheral vascular disease ([place]) - Plan: US Venous Img  Lower Unilateral Left  H/O blood clots - Plan: US Venous Img Lower Unilateral Left  Cellulitis of left lower extremity - Plan: cephALEXin (KEFLEX) 500 MG capsule  Due to patient's lower extremity edema, peripheral vascular disease history of blood clots & complicated medical history I am concerned that he could potentially have another blood clot in LLE. We will get stat venous Doppler today to rule out blood clot.  Redness of skin on left lower extremity could also potentially be a cellulitis infection.  Will cover patient with Keflex 4 times daily for 10 days to treat cellulitis.  Patient advised that whenever he is sitting to elevate leg to help reduce any swelling.  Patient is aware that someone will contact him sometime soon to let him know appointment time frame for venous Doppler.  Once we have Doppler results we will make patient aware right away.  Doppler results will help [place] proceed the next step in plan of care.   I discussed the assessment and treatment plan with the patient. The patient was provided an opportunity to ask questions and all were answered. The patient agreed with the plan and  demonstrated an understanding of the instructions.   The patient was advised to call back or seek an in-person evaluation if the symptoms worsen or if the condition fails to improve as anticipated. Advised to go to ER if emergent evaluation needed.    Jodelle Green, FNP

## 2018-06-14 NOTE — Telephone Encounter (Signed)
We can do a doxy -- I would need to at least see the leg over the video camera  If we need to get a STAT vascular doppler, we can arrange for that  Please ask if he is willing to do a DOXY or he can come into office as long as he has no fever or respiratory symptoms for me to see him

## 2018-06-14 NOTE — Telephone Encounter (Signed)
Could you give Joseph Singh a call and let him know that the pain he is describing sounds consistent more with arthritic changes in the knee.  He should use some Tylenol or ibuprofen for pain.    As far as the redness it doesn't appear to be an infection.  Inquire if he is wearing his compression stockings.  If not, using your compression stockings with elevation will be key to getting in front of any issues with veins.

## 2018-06-20 ENCOUNTER — Other Ambulatory Visit: Payer: Self-pay | Admitting: Family Medicine

## 2018-06-20 ENCOUNTER — Other Ambulatory Visit: Payer: Self-pay

## 2018-06-20 ENCOUNTER — Ambulatory Visit (INDEPENDENT_AMBULATORY_CARE_PROVIDER_SITE_OTHER): Payer: Medicare Other | Admitting: Family Medicine

## 2018-06-20 ENCOUNTER — Encounter: Payer: Self-pay | Admitting: Family Medicine

## 2018-06-20 DIAGNOSIS — R600 Localized edema: Secondary | ICD-10-CM

## 2018-06-20 DIAGNOSIS — L03116 Cellulitis of left lower limb: Secondary | ICD-10-CM | POA: Diagnosis not present

## 2018-06-20 DIAGNOSIS — M5442 Lumbago with sciatica, left side: Principal | ICD-10-CM

## 2018-06-20 DIAGNOSIS — G8929 Other chronic pain: Secondary | ICD-10-CM

## 2018-06-20 MED ORDER — MUPIROCIN 2 % EX OINT: TOPICAL_OINTMENT | CUTANEOUS | 0 refills | Status: DC

## 2018-06-20 NOTE — Telephone Encounter (Signed)
I looked up patient on [place] Controlled Substances Reporting System and saw no activity that raised concern of inappropriate use.   

## 2018-06-20 NOTE — Telephone Encounter (Signed)
Please schedule doxy follow up for Jun 27, 2018 at 10 AM with me

## 2018-06-20 NOTE — Telephone Encounter (Signed)
Last OV 06/20/2018  Last refilled 05/26/2018 disp 90 with no refills   Next OV 07/02/2018  Sent to covering provider for approval

## 2018-06-20 NOTE — Progress Notes (Signed)
Patient ID: Joseph Singh, male   DOB: [birth year], 58 y.o.   MRN: [id number]  Virtual Visit via video Note  This visit type was conducted due to national recommendations for restrictions regarding the COVID-19 pandemic (e.g. social distancing).  This format is felt to be most appropriate for this patient at this time.  All issues noted in this document were discussed and addressed.  No physical exam was performed (except for noted visual exam findings with Video Visits).   I connected with Joseph Singh on 06/20/18 at 10:00 AM EDT by a video enabled telemedicine application and verified that I am speaking with the correct person using two identifiers. Location patient: home Location provider: [facility] Persons participating in the virtual visit: patient, provider  I discussed the limitations, risks, security and privacy concerns of performing an evaluation and management service by video and the availability of in person appointments. I also discussed with the patient that there may be a patient responsible charge related to this service. The patient expressed understanding and agreed to proceed.   HPI:  Patient and I connected via video today to follow-up on left lower extremity edema and left lower extremity redness/cellulitis that he was seen for on 06/14/2018.  We were able to rule out DVT with venous Doppler.  Once DVT was ruled out then I suspected the redness and swelling was related to a cellulitis, patient is currently taking Keflex 4 times daily for 10-day course.  He has approximately 4 days last of this antibiotic course.  Patient does feel the redness is slightly improved and swelling is somewhat better.  He does try to keep legs elevated at home whenever he is able to.  Patient does have chronic peripheral vascular disease which contributes to lower extremity edema that he will have at times.  Denies fever or chills.  Denies shortness of breath or wheezing.  Denies chest pain.   Denies GI or GU issues.  ROS: See pertinent positives and negatives per HPI.  Past Medical History:  Diagnosis Date  . Arthritis   . Diabetes mellitus without complication ([place])   . History of kidney stones   . Hyperlipidemia   . Hypertension   . Neuropathy     Past Surgical History:  Procedure Laterality Date  . LOWER EXTREMITY ANGIOGRAPHY Right 02/25/2016   Procedure: Lower Extremity Angiography;  Surgeon: Algernon Huxley, MD;  Location: [facility];  Service: Cardiovascular;  Laterality: Right;  . LOWER EXTREMITY ANGIOGRAPHY Right 02/27/2017   Procedure: LOWER EXTREMITY ANGIOGRAPHY;  Surgeon: Algernon Huxley, MD;  Location: [facility];  Service: Cardiovascular;  Laterality: Right;  . LOWER EXTREMITY INTERVENTION  02/25/2016   Procedure: Lower Extremity Intervention;  Surgeon: Algernon Huxley, MD;  Location: [facility];  Service: Cardiovascular;;  . TOE AMPUTATION      Family History  Problem Relation Age of Onset  . Diabetes Mother   . Heart disease Father   . Diabetes Sister   . Diabetes Brother   . Cancer Sister        lung  . Cancer Sister        breast   Social History   Tobacco Use  . Smoking status: Current Every Day Smoker    Packs/day: 0.50    Years: 35.00    Pack years: 17.50    Types: Cigarettes  . Smokeless tobacco: Never Used  Substance Use Topics  . Alcohol use: No    Alcohol/week: 0.0  standard drinks    Current Outpatient Medications:  .  amLODipine (NORVASC) 10 MG tablet, TAKE ONE (1) TABLET EACH DAY, Disp: 90 tablet, Rfl: 3 .  aspirin EC 81 MG tablet, Take 1 tablet (81 mg total) by mouth daily., Disp: 150 tablet, Rfl: 2 .  atorvastatin (LIPITOR) 40 MG tablet, TAKE ONE (1) TABLET EACH DAY, Disp: 90 tablet, Rfl: 3 .  blood glucose meter kit and supplies KIT, E11.51, check 2x daily, dispense based on patient and insurance preference, Disp: 1 each, Rfl: 0 .  cephALEXin (KEFLEX) 500 MG capsule, Take 1 capsule (500 mg total) by mouth 4  (four) times daily., Disp: 40 capsule, Rfl: 0 .  clopidogrel (PLAVIX) 75 MG tablet, TAKE ONE (1) TABLET EACH DAY, Disp: 90 tablet, Rfl: 3 .  cyclobenzaprine (FLEXERIL) 10 MG tablet, TAKE ONE TABLET BY MOUTH 3 TIMES DAILY AS NEEDED FOR MUSCLE SPASMS., Disp: 30 tablet, Rfl: 1 .  empagliflozin (JARDIANCE) 25 MG TABS tablet, Take 25 mg by mouth daily., Disp: 180 tablet, Rfl: 1 .  gabapentin (NEURONTIN) 300 MG capsule, Take 1 capsule (300 mg total) by mouth 4 (four) times daily., Disp: 360 capsule, Rfl: 2 .  glipiZIDE (GLUCOTROL XL) 10 MG 24 hr tablet, TAKE ONE (1) TABLET EACH DAY WITH BREAKFAST, Disp: 90 tablet, Rfl: 3 .  glucose blood (ONE TOUCH ULTRA TEST) test strip, Check blood sugar twice daily Dx code: E11.9, Disp: 200 each, Rfl: 0 .  lisinopril-hydrochlorothiazide (PRINZIDE,ZESTORETIC) 20-25 MG tablet, TAKE 1 TABLET BY MOUTH EVERY DAY, Disp: 90 tablet, Rfl: 3 .  metFORMIN (GLUCOPHAGE) 1000 MG tablet, TAKE 1 TABLET BY MOUTH TWICE DAILY WITH A MEAL, Disp: 180 tablet, Rfl: 3 .  metoprolol succinate (TOPROL-XL) 50 MG 24 hr tablet, Take 1 tablet (50 mg total) by mouth daily., Disp: 90 tablet, Rfl: 1 .  TRADJENTA 5 MG TABS tablet, TAKE 1 TABLET BY MOUTH EVERY DAY, Disp: 30 tablet, Rfl: 3 .  traMADol (ULTRAM) 50 MG tablet, TAKE 1 TABLET BY MOUTH THREE TIMES A DAY, Disp: 90 tablet, Rfl: 0 .  Vitamin D, Ergocalciferol, (DRISDOL) 50000 units CAPS capsule, Take 1 capsule (50,000 Units total) by mouth every 7 (seven) days., Disp: 8 capsule, Rfl: 0  EXAM:  GENERAL: alert, oriented, appears well and in no acute distress  HEENT: atraumatic, conjunttiva clear, no obvious abnormalities on inspection of external nose and ears  NECK: normal movements of the head and neck  LUNGS: on inspection no signs of respiratory distress, breathing rate appears normal, no obvious gross SOB, gasping or wheezing  CV: no obvious cyanosis.  Patient's left foot and into left ankle do appear minimally swollen, improved from  last visit.  I had the patient push into skin of left foot and ankle to see if he had pitting edema, mild pitting that I would now rate as trace edema. Capillary refill appears to be less than 2 seconds.  Skin: Redness of skin around left ankle looks fainter than last visit, improving.  MS: moves all visible extremities without noticeable abnormality  PSYCH/NEURO: pleasant and cooperative, no obvious depression or anxiety, speech and thought processing grossly intact  ASSESSMENT AND PLAN:  Discussed the following assessment and plan:  Cellulitis of left lower extremity - Plan: mupirocin ointment (BACTROBAN) 2 %  Lower extremity edema  Patient will finish keflex course. Due to some redness still being present, he will apply thin layer of mupirocin ointment on skin BID for 7 days. Again discussed elevating legs whenever possible to help  combat swelling and also wearing tall socks or compression style socks to help as well.    I discussed the assessment and treatment plan with the patient. The patient was provided an opportunity to ask questions and all were answered. The patient agreed with the plan and demonstrated an understanding of the instructions.   The patient was advised to call back or seek an in-person evaluation if the symptoms worsen or if the condition fails to improve as anticipated.  He will otherwise follow up in 1 week to monitor for continued improvement/resolution.  Jodelle Green, FNP

## 2018-07-02 ENCOUNTER — Telehealth: Payer: Self-pay | Admitting: Family Medicine

## 2018-07-02 ENCOUNTER — Encounter (INDEPENDENT_AMBULATORY_CARE_PROVIDER_SITE_OTHER): Payer: Self-pay

## 2018-07-02 ENCOUNTER — Other Ambulatory Visit: Payer: Self-pay

## 2018-07-02 ENCOUNTER — Ambulatory Visit (INDEPENDENT_AMBULATORY_CARE_PROVIDER_SITE_OTHER): Payer: Medicare Other | Admitting: Family Medicine

## 2018-07-02 ENCOUNTER — Encounter: Payer: Self-pay | Admitting: Family Medicine

## 2018-07-02 ENCOUNTER — Telehealth: Payer: Self-pay | Admitting: *Deleted

## 2018-07-02 DIAGNOSIS — R6889 Other general symptoms and signs: Secondary | ICD-10-CM | POA: Diagnosis not present

## 2018-07-02 DIAGNOSIS — Z20828 Contact with and (suspected) exposure to other viral communicable diseases: Secondary | ICD-10-CM | POA: Diagnosis not present

## 2018-07-02 DIAGNOSIS — I10 Essential (primary) hypertension: Secondary | ICD-10-CM | POA: Diagnosis not present

## 2018-07-02 DIAGNOSIS — IMO0002 Reserved for concepts with insufficient information to code with codable children: Secondary | ICD-10-CM

## 2018-07-02 DIAGNOSIS — E1151 Type 2 diabetes mellitus with diabetic peripheral angiopathy without gangrene: Secondary | ICD-10-CM

## 2018-07-02 DIAGNOSIS — E1165 Type 2 diabetes mellitus with hyperglycemia: Secondary | ICD-10-CM | POA: Diagnosis not present

## 2018-07-02 DIAGNOSIS — Z20822 Contact with and (suspected) exposure to covid-19: Secondary | ICD-10-CM | POA: Insufficient documentation

## 2018-07-02 NOTE — Telephone Encounter (Signed)
I did a visit with the patient today. We will continue to follow his responses and contact him as appropriate.

## 2018-07-02 NOTE — Assessment & Plan Note (Addendum)
Patient is due for lab work.  We will get him set up for labs in 1 month.  Continue current medication.

## 2018-07-02 NOTE — Progress Notes (Signed)
Virtual Visit via video Note  This visit type was conducted due to national recommendations for restrictions regarding the COVID-19 pandemic (e.g. social distancing).  This format is felt to be most appropriate for this patient at this time.  All issues noted in this document were discussed and addressed.  No physical exam was performed (except for noted visual exam findings with Video Visits).   I connected with Joseph Singh today at  9:00 AM EDT by a video enabled telemedicine application and verified that I am speaking with the correct person using two identifiers. Location patient: home Location provider: work Persons participating in the virtual visit: patient, provider, Yousef Huge  I discussed the limitations, risks, security and privacy concerns of performing an evaluation and management service by telephone and the availability of in person appointments. I also discussed with the patient that there may be a patient responsible charge related to this service. The patient expressed understanding and agreed to proceed.  Reason for visit: follow-up  HPI: Fever: Patient notes onset of fever yesterday.  11 F at its highest.  He notes no cough, congestion, facial congestion, shortness of breath, nausea, vomiting, diarrhea, abdominal pain, dysuria, rash, anosmia, or ear discomfort.  He has had no travel.  He has had no COVID-19 exposure.  He was treated for cellulitis several weeks ago though notes he has not had any symptoms in the past 7 days.  He notes the erythema resolved and he has had no swelling.  Hypertension: 122/70.  Taking amlodipine, lisinopril, HCTZ, metoprolol.  No chest pain, shortness of breath, or edema.  Diabetes: He is not checking blood sugars.  He is taking Tradjenta, metformin, Jardiance, and glipizide.  No polyuria or polydipsia.  He did have an episode yesterday where he felt a little dizzy for about 10 seconds though it resolved on his own.  He is unsure if this  is related to his low blood sugar.  He notes he is due to see his ophthalmologist and he will schedule this when he is able to.   ROS: See pertinent positives and negatives per HPI.  Past Medical History:  Diagnosis Date  . Arthritis   . Diabetes mellitus without complication ([place])   . History of kidney stones   . Hyperlipidemia   . Hypertension   . Neuropathy     Past Surgical History:  Procedure Laterality Date  . LOWER EXTREMITY ANGIOGRAPHY Right 02/25/2016   Procedure: Lower Extremity Angiography;  Surgeon: Algernon Huxley, MD;  Location: [facility];  Service: Cardiovascular;  Laterality: Right;  . LOWER EXTREMITY ANGIOGRAPHY Right 02/27/2017   Procedure: LOWER EXTREMITY ANGIOGRAPHY;  Surgeon: Algernon Huxley, MD;  Location: [facility];  Service: Cardiovascular;  Laterality: Right;  . LOWER EXTREMITY INTERVENTION  02/25/2016   Procedure: Lower Extremity Intervention;  Surgeon: Algernon Huxley, MD;  Location: [facility];  Service: Cardiovascular;;  . TOE AMPUTATION      Family History  Problem Relation Age of Onset  . Diabetes Mother   . Heart disease Father   . Diabetes Sister   . Diabetes Brother   . Cancer Sister        lung  . Cancer Sister        breast    SOCIAL HX: Smoker.   Current Outpatient Medications:  .  amLODipine (NORVASC) 10 MG tablet, TAKE ONE (1) TABLET EACH DAY, Disp: 90 tablet, Rfl: 3 .  aspirin EC 81 MG tablet, Take  1 tablet (81 mg total) by mouth daily., Disp: 150 tablet, Rfl: 2 .  atorvastatin (LIPITOR) 40 MG tablet, TAKE ONE (1) TABLET EACH DAY, Disp: 90 tablet, Rfl: 3 .  blood glucose meter kit and supplies KIT, E11.51, check 2x daily, dispense based on patient and insurance preference, Disp: 1 each, Rfl: 0 .  cephALEXin (KEFLEX) 500 MG capsule, Take 1 capsule (500 mg total) by mouth 4 (four) times daily., Disp: 40 capsule, Rfl: 0 .  clopidogrel (PLAVIX) 75 MG tablet, TAKE ONE (1) TABLET EACH DAY, Disp: 90 tablet, Rfl: 3 .   cyclobenzaprine (FLEXERIL) 10 MG tablet, TAKE ONE TABLET BY MOUTH 3 TIMES DAILY AS NEEDED FOR MUSCLE SPASMS., Disp: 30 tablet, Rfl: 1 .  empagliflozin (JARDIANCE) 25 MG TABS tablet, Take 25 mg by mouth daily., Disp: 180 tablet, Rfl: 1 .  gabapentin (NEURONTIN) 300 MG capsule, Take 1 capsule (300 mg total) by mouth 4 (four) times daily., Disp: 360 capsule, Rfl: 2 .  glipiZIDE (GLUCOTROL XL) 10 MG 24 hr tablet, TAKE ONE (1) TABLET EACH DAY WITH BREAKFAST, Disp: 90 tablet, Rfl: 3 .  glucose blood (ONE TOUCH ULTRA TEST) test strip, Check blood sugar twice daily Dx code: E11.9, Disp: 200 each, Rfl: 0 .  lisinopril-hydrochlorothiazide (PRINZIDE,ZESTORETIC) 20-25 MG tablet, TAKE 1 TABLET BY MOUTH EVERY DAY, Disp: 90 tablet, Rfl: 3 .  metFORMIN (GLUCOPHAGE) 1000 MG tablet, TAKE 1 TABLET BY MOUTH TWICE DAILY WITH A MEAL, Disp: 180 tablet, Rfl: 3 .  metoprolol succinate (TOPROL-XL) 50 MG 24 hr tablet, Take 1 tablet (50 mg total) by mouth daily., Disp: 90 tablet, Rfl: 1 .  mupirocin ointment (BACTROBAN) 2 %, Place thin layer of ointment over red area of skin on left leg 2 times per day for 7 days., Disp: 22 g, Rfl: 0 .  TRADJENTA 5 MG TABS tablet, TAKE 1 TABLET BY MOUTH EVERY DAY, Disp: 30 tablet, Rfl: 3 .  traMADol (ULTRAM) 50 MG tablet, TAKE 1 TABLET BY MOUTH THREE TIMES A DAY, Disp: 90 tablet, Rfl: 0 .  Vitamin D, Ergocalciferol, (DRISDOL) 50000 units CAPS capsule, Take 1 capsule (50,000 Units total) by mouth every 7 (seven) days., Disp: 8 capsule, Rfl: 0  EXAM:  VITALS per patient if applicable: temp 219 F  GENERAL: alert, oriented, appears well and in no acute distress  HEENT: atraumatic, conjunttiva clear, no obvious abnormalities on inspection of external nose and ears  NECK: normal movements of the head and neck  LUNGS: on inspection no signs of respiratory distress, breathing rate appears normal, no obvious gross SOB, gasping or wheezing  CV: no obvious cyanosis  MS: moves all visible  extremities without noticeable abnormality, left foot with no erythema or swelling  PSYCH/NEURO: pleasant and cooperative, no obvious depression or anxiety, speech and thought processing grossly intact  ASSESSMENT AND PLAN:  Discussed the following assessment and plan:  Suspected Covid-19 Virus Infection - Plan: MYCHART COVID-19 HOME MONITORING PROGRAM, Temperature monitoring  DM (diabetes mellitus) type II uncontrolled, periph vascular disorder [name]  Essential hypertension  Suspected Covid-19 Virus Infection Patient with new onset fever with no obvious other contributing causes or symptoms.  His prior cellulitis has resolved and does not appear to be contributing.  I am concerned for COVID-19 given current fever.  I discussed strict quarantine precautions for him and the family members that live with him.  Discussed that he would need to be quarantined for at least 7 days and have at least 3 days of improved symptoms and  lack of fever for at least 3 days with out use of antipyretics prior to his quarantine ending.  Discussed 14-day quarantine for family members in his household.  Discussed tracking anybody that comes to the house and advised that anyone comes to the house they should not come in the house and should leave anything they are bringing outside the house.  He will be signed up for the mychart monitoring.  He was given reasons to seek medical care in the emergency department.  DM (diabetes mellitus) type II uncontrolled, periph vascular disorder Patient is due for lab work.  We will get him set up for labs in 1 month.  Continue current medication.  Essential hypertension Well-controlled.  Continue current medications.  Lab work in 1 month.    I discussed the assessment and treatment plan with the patient. The patient was provided an opportunity to ask questions and all were answered. The patient agreed with the plan and demonstrated an understanding of the instructions.   The  patient was advised to call back or seek an in-person evaluation if the symptoms worsen or if the condition fails to improve as anticipated.   [name] Rumps, MD

## 2018-07-02 NOTE — Assessment & Plan Note (Signed)
Patient with new onset fever with no obvious other contributing causes or symptoms.  His prior cellulitis has resolved and does not appear to be contributing.  I am concerned for COVID-19 given current fever.  I discussed strict quarantine precautions for him and the family members that live with him.  Discussed that he would need to be quarantined for at least 7 days and have at least 3 days of improved symptoms and lack of fever for at least 3 days with out use of antipyretics prior to his quarantine ending.  Discussed 14-day quarantine for family members in his household.  Discussed tracking anybody that comes to the house and advised that anyone comes to the house they should not come in the house and should leave anything they are bringing outside the house.  He will be signed up for the mychart monitoring.  He was given reasons to seek medical care in the emergency department.

## 2018-07-02 NOTE — Assessment & Plan Note (Signed)
Well-controlled.  Continue current medications.  Lab work in 1 month.

## 2018-07-02 NOTE — Telephone Encounter (Signed)
Contacting pt due to my chart companion response of worsening appetite 07/02/2018; he states that he has not gotten up to eat; he normally eats before 1000, but his fever is gone; pt says that he will get up to get something to eat; he normally sees Dr Marikay Alar, [facility];will route to provider for notification.

## 2018-07-02 NOTE — Telephone Encounter (Signed)
Please contact the patient and get him set up for lab work in 1 month.  He should not come in any sooner than that.  He needs follow-up in the office in about 4 months.

## 2018-07-03 ENCOUNTER — Encounter (INDEPENDENT_AMBULATORY_CARE_PROVIDER_SITE_OTHER): Payer: Self-pay

## 2018-07-04 ENCOUNTER — Encounter (INDEPENDENT_AMBULATORY_CARE_PROVIDER_SITE_OTHER): Payer: Self-pay

## 2018-07-05 ENCOUNTER — Encounter (INDEPENDENT_AMBULATORY_CARE_PROVIDER_SITE_OTHER): Payer: Self-pay

## 2018-07-05 NOTE — Telephone Encounter (Signed)
Unable to leave message for patient to return call back. VM Box not set up. PEC may give and obtain information.

## 2018-07-06 ENCOUNTER — Encounter (INDEPENDENT_AMBULATORY_CARE_PROVIDER_SITE_OTHER): Payer: Self-pay

## 2018-07-07 ENCOUNTER — Telehealth: Payer: Self-pay

## 2018-07-07 ENCOUNTER — Encounter (INDEPENDENT_AMBULATORY_CARE_PROVIDER_SITE_OTHER): Payer: Self-pay

## 2018-07-07 NOTE — Telephone Encounter (Signed)
Called pt and he stated that his wife filled out the the form. Pt denies new cough. Pt stated that he feels much better. Denies any symptoms today

## 2018-07-07 NOTE — Telephone Encounter (Signed)
Called pt's mobile number and mailbox was full.  Called pt on home number and left message.

## 2018-07-08 ENCOUNTER — Encounter (INDEPENDENT_AMBULATORY_CARE_PROVIDER_SITE_OTHER): Payer: Self-pay

## 2018-07-10 DIAGNOSIS — E1140 Type 2 diabetes mellitus with diabetic neuropathy, unspecified: Secondary | ICD-10-CM | POA: Diagnosis not present

## 2018-07-10 DIAGNOSIS — L97521 Non-pressure chronic ulcer of other part of left foot limited to breakdown of skin: Secondary | ICD-10-CM | POA: Diagnosis not present

## 2018-07-10 DIAGNOSIS — L97522 Non-pressure chronic ulcer of other part of left foot with fat layer exposed: Secondary | ICD-10-CM | POA: Diagnosis not present

## 2018-07-17 DIAGNOSIS — E1140 Type 2 diabetes mellitus with diabetic neuropathy, unspecified: Secondary | ICD-10-CM | POA: Diagnosis not present

## 2018-07-17 DIAGNOSIS — L97521 Non-pressure chronic ulcer of other part of left foot limited to breakdown of skin: Secondary | ICD-10-CM | POA: Diagnosis not present

## 2018-07-17 DIAGNOSIS — L02612 Cutaneous abscess of left foot: Secondary | ICD-10-CM | POA: Diagnosis not present

## 2018-07-17 DIAGNOSIS — L03116 Cellulitis of left lower limb: Secondary | ICD-10-CM | POA: Diagnosis not present

## 2018-07-19 ENCOUNTER — Other Ambulatory Visit: Payer: Self-pay | Admitting: Family

## 2018-07-19 DIAGNOSIS — G8929 Other chronic pain: Secondary | ICD-10-CM

## 2018-07-20 NOTE — Telephone Encounter (Signed)
Pt called to check status on refill. Please advise  °

## 2018-07-25 DIAGNOSIS — L97521 Non-pressure chronic ulcer of other part of left foot limited to breakdown of skin: Secondary | ICD-10-CM | POA: Diagnosis not present

## 2018-07-25 DIAGNOSIS — L03116 Cellulitis of left lower limb: Secondary | ICD-10-CM | POA: Diagnosis not present

## 2018-07-25 DIAGNOSIS — E1140 Type 2 diabetes mellitus with diabetic neuropathy, unspecified: Secondary | ICD-10-CM | POA: Diagnosis not present

## 2018-07-30 ENCOUNTER — Telehealth (INDEPENDENT_AMBULATORY_CARE_PROVIDER_SITE_OTHER): Payer: Self-pay

## 2018-07-30 ENCOUNTER — Encounter (INDEPENDENT_AMBULATORY_CARE_PROVIDER_SITE_OTHER): Payer: Self-pay

## 2018-07-30 ENCOUNTER — Other Ambulatory Visit: Admission: RE | Admit: 2018-07-30 | Payer: Medicare Other | Source: Ambulatory Visit

## 2018-07-30 DIAGNOSIS — L97522 Non-pressure chronic ulcer of other part of left foot with fat layer exposed: Secondary | ICD-10-CM | POA: Diagnosis not present

## 2018-07-30 NOTE — Telephone Encounter (Signed)
  Per Dr. Lucky Cowboy and Dr. Elvina Mattes, the patient was to be scheduled for his procedure due to gangrene.     Spoke with the patient's wife and the patient is now scheduled for his procedure with Dr. Lucky Cowboy on 08/06/2018 with a 8:00 am arrival time. His Covid test is on 08/02/2018 between 10;30-12:30pm. This information will be mailed out.

## 2018-07-31 ENCOUNTER — Other Ambulatory Visit (INDEPENDENT_AMBULATORY_CARE_PROVIDER_SITE_OTHER): Payer: Self-pay | Admitting: Nurse Practitioner

## 2018-08-02 ENCOUNTER — Other Ambulatory Visit: Payer: Self-pay

## 2018-08-02 ENCOUNTER — Other Ambulatory Visit
Admission: RE | Admit: 2018-08-02 | Discharge: 2018-08-02 | Disposition: A | Discharge status: Home / Self Care | Payer: Medicare Other | Source: Ambulatory Visit | Attending: Vascular Surgery | Admitting: Vascular Surgery

## 2018-08-02 ENCOUNTER — Encounter (INDEPENDENT_AMBULATORY_CARE_PROVIDER_SITE_OTHER): Payer: Self-pay

## 2018-08-02 DIAGNOSIS — M1990 Unspecified osteoarthritis, unspecified site: Secondary | ICD-10-CM | POA: Diagnosis not present

## 2018-08-02 DIAGNOSIS — I70245 Atherosclerosis of native arteries of left leg with ulceration of other part of foot: Secondary | ICD-10-CM | POA: Diagnosis not present

## 2018-08-02 DIAGNOSIS — E1140 Type 2 diabetes mellitus with diabetic neuropathy, unspecified: Secondary | ICD-10-CM | POA: Diagnosis not present

## 2018-08-02 DIAGNOSIS — E785 Hyperlipidemia, unspecified: Secondary | ICD-10-CM | POA: Diagnosis not present

## 2018-08-02 DIAGNOSIS — F17210 Nicotine dependence, cigarettes, uncomplicated: Secondary | ICD-10-CM | POA: Diagnosis not present

## 2018-08-02 DIAGNOSIS — Z8249 Family history of ischemic heart disease and other diseases of the circulatory system: Secondary | ICD-10-CM | POA: Diagnosis not present

## 2018-08-02 DIAGNOSIS — I10 Essential (primary) hypertension: Secondary | ICD-10-CM | POA: Diagnosis not present

## 2018-08-02 DIAGNOSIS — Z1159 Encounter for screening for other viral diseases: Secondary | ICD-10-CM | POA: Diagnosis not present

## 2018-08-02 DIAGNOSIS — E1151 Type 2 diabetes mellitus with diabetic peripheral angiopathy without gangrene: Secondary | ICD-10-CM | POA: Diagnosis not present

## 2018-08-02 DIAGNOSIS — L97529 Non-pressure chronic ulcer of other part of left foot with unspecified severity: Secondary | ICD-10-CM | POA: Diagnosis not present

## 2018-08-02 LAB — CREATININE, SERUM
Creatinine, Ser: 1.09 mg/dL (ref 0.61–1.24)
GFR calc Af Amer: >60 mL/min (ref >60)
GFR calc non Af Amer: >60 mL/min (ref >60)

## 2018-08-02 LAB — BUN: BUN: 21 mg/dL — ABNORMAL HIGH (ref 6–20)

## 2018-08-03 LAB — NOVEL CORONAVIRUS, NAA (HOSP ORDER, SEND-OUT TO REF LAB; TAT 18-24 HRS): SARS-CoV-2, NAA: NOT DETECTED

## 2018-08-06 ENCOUNTER — Encounter: Payer: Self-pay | Admitting: Vascular Surgery

## 2018-08-06 ENCOUNTER — Encounter
Admission: RE | Disposition: A | Discharge status: Home / Self Care | Payer: Self-pay | Source: Home / Self Care | Attending: Vascular Surgery

## 2018-08-06 ENCOUNTER — Other Ambulatory Visit: Payer: Self-pay

## 2018-08-06 ENCOUNTER — Ambulatory Visit
Admission: RE | Admit: 2018-08-06 | Discharge: 2018-08-06 | Disposition: A | Discharge status: Home / Self Care | Payer: Medicare Other | Attending: Vascular Surgery | Admitting: Vascular Surgery

## 2018-08-06 DIAGNOSIS — F17210 Nicotine dependence, cigarettes, uncomplicated: Secondary | ICD-10-CM

## 2018-08-06 DIAGNOSIS — I70245 Atherosclerosis of native arteries of left leg with ulceration of other part of foot: Secondary | ICD-10-CM | POA: Diagnosis not present

## 2018-08-06 DIAGNOSIS — E1140 Type 2 diabetes mellitus with diabetic neuropathy, unspecified: Secondary | ICD-10-CM | POA: Insufficient documentation

## 2018-08-06 DIAGNOSIS — E1151 Type 2 diabetes mellitus with diabetic peripheral angiopathy without gangrene: Secondary | ICD-10-CM

## 2018-08-06 DIAGNOSIS — M1990 Unspecified osteoarthritis, unspecified site: Secondary | ICD-10-CM | POA: Diagnosis not present

## 2018-08-06 DIAGNOSIS — Z1159 Encounter for screening for other viral diseases: Secondary | ICD-10-CM | POA: Diagnosis not present

## 2018-08-06 DIAGNOSIS — Z8249 Family history of ischemic heart disease and other diseases of the circulatory system: Secondary | ICD-10-CM | POA: Insufficient documentation

## 2018-08-06 DIAGNOSIS — Z79899 Other long term (current) drug therapy: Secondary | ICD-10-CM

## 2018-08-06 DIAGNOSIS — L97529 Non-pressure chronic ulcer of other part of left foot with unspecified severity: Secondary | ICD-10-CM | POA: Insufficient documentation

## 2018-08-06 DIAGNOSIS — I7092 Chronic total occlusion of artery of the extremities: Secondary | ICD-10-CM | POA: Diagnosis not present

## 2018-08-06 DIAGNOSIS — I10 Essential (primary) hypertension: Secondary | ICD-10-CM | POA: Insufficient documentation

## 2018-08-06 DIAGNOSIS — I96 Gangrene, not elsewhere classified: Secondary | ICD-10-CM

## 2018-08-06 DIAGNOSIS — E785 Hyperlipidemia, unspecified: Secondary | ICD-10-CM | POA: Diagnosis not present

## 2018-08-06 HISTORY — PX: LOWER EXTREMITY ANGIOGRAPHY: CATH118251

## 2018-08-06 LAB — GLUCOSE, CAPILLARY
Glucose-Capillary: 81 mg/dL (ref 70–99)
Glucose-Capillary: 99 mg/dL (ref 70–99)

## 2018-08-06 SURGERY — LOWER EXTREMITY ANGIOGRAPHY
Anesthesia: Moderate Sedation | Site: Leg Lower | Laterality: Left

## 2018-08-06 MED ORDER — MIDAZOLAM HCL 5 MG/5ML IJ SOLN
INTRAMUSCULAR | Status: AC
  Filled 2018-08-06: qty 5

## 2018-08-06 MED ORDER — HYDROMORPHONE HCL 1 MG/ML IJ SOLN: 1.0000 mg | Freq: Once | INTRAMUSCULAR | Status: DC | PRN

## 2018-08-06 MED ORDER — SODIUM CHLORIDE 0.9 % IV SOLN
INTRAVENOUS | Status: DC
  Administered 2018-08-06: 08:00:00 via INTRAVENOUS

## 2018-08-06 MED ORDER — FAMOTIDINE 20 MG PO TABS: 40.0000 mg | ORAL_TABLET | Freq: Once | ORAL | Status: DC | PRN

## 2018-08-06 MED ORDER — ONDANSETRON HCL 4 MG/2ML IJ SOLN: 4.0000 mg | Freq: Four times a day (QID) | INTRAMUSCULAR | Status: DC | PRN

## 2018-08-06 MED ORDER — CEFAZOLIN SODIUM-DEXTROSE 2-4 GM/100ML-% IV SOLN
2.0000 g | Freq: Once | INTRAVENOUS | Status: AC
  Administered 2018-08-06: 2 g via INTRAVENOUS

## 2018-08-06 MED ORDER — METHYLPREDNISOLONE SODIUM SUCC 125 MG IJ SOLR: 125.0000 mg | Freq: Once | INTRAMUSCULAR | Status: DC | PRN

## 2018-08-06 MED ORDER — FENTANYL CITRATE (PF) 100 MCG/2ML IJ SOLN
INTRAMUSCULAR | Status: AC
  Filled 2018-08-06: qty 2

## 2018-08-06 MED ORDER — MIDAZOLAM HCL 2 MG/ML PO SYRP: 8.0000 mg | ORAL_SOLUTION | Freq: Once | ORAL | Status: DC | PRN

## 2018-08-06 MED ORDER — IODIXANOL 320 MG/ML IV SOLN
INTRAVENOUS | Status: DC | PRN
  Administered 2018-08-06: 110 mL via INTRA_ARTERIAL

## 2018-08-06 MED ORDER — FENTANYL CITRATE (PF) 100 MCG/2ML IJ SOLN
INTRAMUSCULAR | Status: DC | PRN
  Administered 2018-08-06: 25 ug via INTRAVENOUS
  Administered 2018-08-06: 50 ug via INTRAVENOUS
  Administered 2018-08-06 (×2): 25 ug via INTRAVENOUS

## 2018-08-06 MED ORDER — HEPARIN SODIUM (PORCINE) 1000 UNIT/ML IJ SOLN
INTRAMUSCULAR | Status: AC
  Filled 2018-08-06: qty 1

## 2018-08-06 MED ORDER — DIPHENHYDRAMINE HCL 50 MG/ML IJ SOLN: 50.0000 mg | Freq: Once | INTRAMUSCULAR | Status: DC | PRN

## 2018-08-06 MED ORDER — MIDAZOLAM HCL 2 MG/2ML IJ SOLN
INTRAMUSCULAR | Status: DC | PRN
  Administered 2018-08-06 (×2): 1 mg via INTRAVENOUS
  Administered 2018-08-06: 2 mg via INTRAVENOUS
  Administered 2018-08-06: 1 mg via INTRAVENOUS

## 2018-08-06 MED ORDER — HEPARIN SODIUM (PORCINE) 1000 UNIT/ML IJ SOLN
INTRAMUSCULAR | Status: DC | PRN
  Administered 2018-08-06: 6000 [IU] via INTRAVENOUS

## 2018-08-06 SURGICAL SUPPLY — 20 items
BALLN LUTONIX 018 5X150X130 (BALLOONS) ×3
BALLN ULTRVRSE 018 2.5X150X150 (BALLOONS) ×3
BALLN ULTRVRSE 3.5X300X150 (BALLOONS) ×3
BALLOON LUTONIX 018 5X150X130 (BALLOONS) ×1 IMPLANT
BALLOON ULTRVRSE 3.5X300X150 (BALLOONS) ×1 IMPLANT
BALLOON ULTRVS 018 2.5X150X150 (BALLOONS) ×1 IMPLANT
CATH BEACON 5 .038 100 VERT TP (CATHETERS) ×3 IMPLANT
CATH CXI SUPP ANG 4FR 135 (CATHETERS) ×1 IMPLANT
CATH CXI SUPP ANG 4FR 135CM (CATHETERS) ×3
CATH PIG 70CM (CATHETERS) ×3 IMPLANT
DEVICE PRESTO INFLATION (MISCELLANEOUS) ×3 IMPLANT
DEVICE STARCLOSE SE CLOSURE (Vascular Products) ×3 IMPLANT
GLIDEWIRE ADV .035X260CM (WIRE) ×3 IMPLANT
PACK ANGIOGRAPHY (CUSTOM PROCEDURE TRAY) ×3 IMPLANT
SHEATH ANL2 6FRX45 HC (SHEATH) ×3 IMPLANT
SHEATH BRITE TIP 5FRX11 (SHEATH) ×3 IMPLANT
SYR MEDRAD MARK 7 150ML (SYRINGE) ×3 IMPLANT
TUBING CONTRAST HIGH PRESS 72 (TUBING) ×3 IMPLANT
WIRE G V18X300CM (WIRE) ×3 IMPLANT
WIRE J 3MM .035X145CM (WIRE) ×3 IMPLANT

## 2018-08-06 NOTE — H&P (Signed)
[facility] Admission History & Physical  MRN : [id number]  Joseph Singh is a 58 y.o. (May 20, 1960) male who presents with chief complaint of No chief complaint on file. [facility].  History of Present Illness: Patient presents today for evaluation of nonhealing ulcerations on the left leg.  He is sent over from Dr. Orland Jarredroxler.  This has been slowly getting better but has been present for many weeks.  He has a known history of peripheral arterial disease with multiple interventions on the right leg previously.  He has not had an intervention in the last year or so.  His right leg is doing well.  No ulcerations or pain on the right leg.  No fevers or chills or signs of systemic infection.  Current Facility-Administered Medications  Medication Dose Route Frequency Provider Last Rate Last Dose  . 0.9 %  sodium chloride infusion   Intravenous Continuous Georgiana SpinnerBrown, Fallon E, NP 75 mL/hr at 08/06/18 0818    . ceFAZolin (ANCEF) IVPB 2g/100 mL premix  2 g Intravenous Once Georgiana SpinnerBrown, Fallon E, NP      . diphenhydrAMINE (BENADRYL) injection 50 mg  50 mg Intravenous Once PRN Georgiana SpinnerBrown, Fallon E, NP      . famotidine (PEPCID) tablet 40 mg  40 mg Oral Once PRN Georgiana SpinnerBrown, Fallon E, NP      . HYDROmorphone (DILAUDID) injection 1 mg  1 mg Intravenous Once PRN Sheppard PlumberBrown, Fallon E, NP      . methylPREDNISolone sodium succinate (SOLU-MEDROL) 125 mg/2 mL injection 125 mg  125 mg Intravenous Once PRN Sheppard PlumberBrown, Fallon E, NP      . midazolam (VERSED) 2 MG/ML syrup 8 mg  8 mg Oral Once PRN Georgiana SpinnerBrown, Fallon E, NP      . ondansetron (ZOFRAN) injection 4 mg  4 mg Intravenous Q6H PRN Georgiana SpinnerBrown, Fallon E, NP        Past Medical History:  Diagnosis Date  . Arthritis   . Diabetes mellitus without complication [name]   . History of kidney stones   . Hyperlipidemia   . Hypertension   . Neuropathy     Past Surgical History:  Procedure Laterality Date  . LOWER EXTREMITY ANGIOGRAPHY Right 02/25/2016   Procedure: Lower Extremity  Angiography;  Surgeon: Annice NeedyJason S Evertt Chouinard, MD;  Location: [facility];  Service: Cardiovascular;  Laterality: Right;  . LOWER EXTREMITY ANGIOGRAPHY Right 02/27/2017   Procedure: LOWER EXTREMITY ANGIOGRAPHY;  Surgeon: Annice Needyew, Rae Plotner S, MD;  Location: [facility];  Service: Cardiovascular;  Laterality: Right;  . LOWER EXTREMITY INTERVENTION  02/25/2016   Procedure: Lower Extremity Intervention;  Surgeon: Annice NeedyJason S Richa Shor, MD;  Location: [facility];  Service: Cardiovascular;;  . TOE AMPUTATION      Social History Social History   Tobacco Use  . Smoking status: Current Every Day Smoker    Packs/day: 0.50    Years: 35.00    Pack years: 17.50    Types: Cigarettes  . Smokeless tobacco: Never Used  Substance Use Topics  . Alcohol use: No    Alcohol/week: 0.0 standard drinks  . Drug use: No    Family History Family History  Problem Relation Age of Onset  . Diabetes Mother   . Heart disease Father   . Diabetes Sister   . Diabetes Brother   . Cancer Sister        lung  . Cancer Sister        breast    No Known  Allergies   REVIEW OF SYSTEMS (Negative unless checked)  Constitutional: [] Weight loss  [] Fever  [] Chills Cardiac: [] Chest pain   [] Chest pressure   [] Palpitations   [] Shortness of breath when laying flat   [] Shortness of breath at rest   [] Shortness of breath with exertion. Vascular:  [] Pain in legs with walking   [] Pain in legs at rest   [] Pain in legs when laying flat   [] Claudication   [] Pain in feet when walking  [] Pain in feet at rest  [] Pain in feet when laying flat   [] History of DVT   [] Phlebitis   [] Swelling in legs   [] Varicose veins   [x] Non-healing ulcers Pulmonary:   [] Uses home oxygen   [] Productive cough   [] Hemoptysis   [] Wheeze  [] COPD   [] Asthma Neurologic:  [] Dizziness  [] Blackouts   [] Seizures   [] History of stroke   [] History of TIA  [] Aphasia   [] Temporary blindness   [] Dysphagia   [] Weakness or numbness in arms   [] Weakness or numbness in  legs Musculoskeletal:  [x] Arthritis   [] Joint swelling   [] Joint pain   [] Low back pain Hematologic:  [] Easy bruising  [] Easy bleeding   [] Hypercoagulable state   [] Anemic  [] Hepatitis Gastrointestinal:  [] Blood in stool   [] Vomiting blood  [] Gastroesophageal reflux/heartburn   [] Difficulty swallowing. Genitourinary:  [] Chronic kidney disease   [] Difficult urination  [] Frequent urination  [] Burning with urination   [] Blood in urine Skin:  [] Rashes   [x] Ulcers   [x] Wounds Psychological:  [] History of anxiety   []  History of major depression.  Physical Examination  Vitals:   08/06/18 0811  BP: (!) 148/69  Pulse: 64  Resp: 20  Temp: 98 F (36.7 C)  TempSrc: Oral  SpO2: 100%  Weight: 136.1 kg  Height: 6\' 4"  (1.93 m)   Body mass index is 36.52 kg/m. Gen: WD/WN, NAD Head: Becker/AT, No temporalis wasting. Ear/Nose/Throat: Hearing grossly intact, nares w/o erythema or drainage, oropharynx w/o Erythema/Exudate,  Eyes: Conjunctiva clear, sclera non-icteric Neck: Trachea midline.  No JVD.  Pulmonary:  Good air movement, respirations not labored, no use of accessory muscles.  Cardiac: RRR, normal S1, S2. Vascular:  Vessel Right Left  Radial Palpable Palpable                          PT 1+ Palpable 1+ Palpable  DP Palpable 1+ Palpable   Gastrointestinal: soft, non-tender/non-distended. No guarding/reflex.  Musculoskeletal: M/S 5/5 throughout.  Extremities without ischemic changes.  No deformity or atrophy.  Neurologic: Sensation grossly intact in extremities.  Symmetrical.  Speech is fluent. Motor exam as listed above. Psychiatric: Judgment intact, Mood & affect appropriate for pt's clinical situation. Dermatologic: ulcer on the left foot, currently dressed     CBC Lab Results  Component Value Date   WBC 8.9 02/23/2018   HGB 16.2 02/23/2018   HCT 47.6 02/23/2018   MCV 92.4 02/23/2018   PLT 267.0 02/23/2018    BMET    Component Value Date/Time   NA 134 (L) 03/27/2018  0804   K 4.3 03/27/2018 0804   CL 98 03/27/2018 0804   CO2 28 03/27/2018 0804   GLUCOSE 112 (H) 03/27/2018 0804   BUN 21 (H) 08/02/2018 1003   CREATININE 1.09 08/02/2018 1003   CREATININE 1.15 09/30/2016 1526   CALCIUM 9.8 03/27/2018 0804   GFRNONAA >60 08/02/2018 1003   GFRAA >60 08/02/2018 1003   Estimated Creatinine Clearance: 109.9 mL/min (by C-G formula based on  SCr of 1.09 mg/dL).  COAG Lab Results  Component Value Date   INR 1.02 02/27/2017    Radiology No results found.   Assessment/Plan 1.  PAD with ulceration left lower extremity.  For angiogram today for further evaluation and potential treatment.  Clearly a severe and limb threatening situation. 2.  Diabetes. blood glucose control important in reducing the progression of atherosclerotic disease. Also, involved in wound healing. On appropriate medications. 3.  Hypertension.  Stable on outpatient medications and blood pressure control important in reducing the progression of atherosclerotic disease. On appropriate oral medications. 4.  Hyperlipidemia. Stable on outpatient medication and lipid control important in reducing the progression of atherosclerotic disease. Continue statin therapy    Leotis Pain, MD  08/06/2018 9:21 AM

## 2018-08-06 NOTE — Progress Notes (Signed)
Dr. Dew at bedside, speaking with pt. Re: procedural results. Pt. Verbalized understanding of conversation.  

## 2018-08-06 NOTE — Op Note (Signed)
[facility]  Percutaneous Study/Intervention Procedural Note   Date of Surgery: 08/06/2018  Surgeon(s):Klaudia Beirne    Assistants:none  Pre-operative Diagnosis: PAD with ulceration left lower extremity  Post-operative diagnosis:  Same  Procedure(s) Performed:             1.  Ultrasound guidance for vascular access right femoral artery             2.  Catheter placement into left common femoral artery from right femoral approach             3.  Aortogram and selective left lower extremity angiogram including selective imaging of the posterior tibial and peroneal arteries             4.  Percutaneous transluminal angioplasty of the left popliteal artery and proximal tibioperoneal trunk with 5 mm diameter Lutonix drug-coated angioplasty balloon             5.   Percutaneous transluminal angioplasty of the left peroneal artery with 2.5 and 3.5 mm diameter angioplasty balloons  6.  StarClose closure device right femoral artery  EBL: 10 cc  Contrast: 110 cc  Fluoro Time: 9.8 minutes  Moderate Conscious Sedation Time: approximately 40 minutes using 5 mg of Versed and 125 Mcg of Fentanyl              Indications:  Patient is a 58 y.o.male with a known history of significant peripheral arterial disease status post multiple previous interventions more on the right leg than the left, but none recently.  He has nonhealing ulcerations on the left foot and his podiatrist has asked [place] to evaluate his perfusion. The patient is brought in for angiography for further evaluation and potential treatment.  Due to the limb threatening nature of the situation, angiogram was performed for attempted limb salvage. The patient is aware that if the procedure fails, amputation would be expected.  The patient also understands that even with successful revascularization, amputation may still be required due to the severity of the situation.  Risks and benefits are discussed and informed consent  is obtained.   Procedure:  The patient was identified and appropriate procedural time out was performed.  The patient was then placed supine on the table and prepped and draped in the usual sterile fashion. Moderate conscious sedation was administered during a face to face encounter with the patient throughout the procedure with my supervision of the RN administering medicines and monitoring the patient's vital signs, pulse oximetry, telemetry and mental status throughout from the start of the procedure until the patient was taken to the recovery room. Ultrasound was used to evaluate the right common femoral artery.  It was patent .  A digital ultrasound image was acquired.  A Seldinger needle was used to access the right common femoral artery under direct ultrasound guidance and a permanent image was performed.  A 0.035 J wire was advanced without resistance and a 5Fr sheath was placed.  Pigtail catheter was placed into the aorta and an AP aortogram was performed. This demonstrated normal renal arteries and normal aorta and iliac segments without significant stenosis. I then crossed the aortic bifurcation and advanced to the left femoral head. Selective left lower extremity angiogram was then performed. This demonstrated reasonably normal common femoral artery, profunda femoris artery, and superficial femoral artery with multiple areas of mild disease of less than 40%.  The below-knee popliteal artery and the proximal tibioperoneal trunk then had a calcific stenosis in the  70 to 80% range.  The anterior tibial artery origin was amongst this calcific disease in the anterior tibial artery was chronically occluded.  The posterior tibial artery was then large and patent to the foot.  The peroneal artery was a good size but the mid to distal peroneal artery had a short to medium segment occlusion. It was felt that it was in the patient's best interest to proceed with intervention after these images to avoid a second  procedure and a larger amount of contrast and fluoroscopy based off of the findings from the initial angiogram. The patient was systemically heparinized and a 6 [place] Ansell sheath was then placed over the [employer] wire. I then used a Kumpe catheter and the advantage wire to navigate down into the popliteal artery.  I then exchanged for a CXI catheter and cross the disease in the popliteal artery and tibioperoneal trunk and advanced into the posterior tibial artery.  Imaging showed brisk flow through the posterior tibial artery and no focal stenosis within the posterior tibial artery with continuous flow into the foot.  I then exchanged for a V 18 wire and pulled the CXI catheter back to the tibioperoneal trunk.  I then advanced down into the peroneal artery.  Selective imaging with the CXI catheter in the proximal peroneal artery was performed which showed the occlusion that was about 6 to 8 cm long in the mid to distal peroneal artery but there was distal reconstitution as a distal target.  Using this image as a roadmap, I used a V 18 wire and the CXI catheter to cross the occlusion and parked the wire at the ankle.  I then proceeded with treatment.  I started with a 2.5 mm diameter by 15 cm length angioplasty balloon in the peroneal artery from just above the ankle up to the mid peroneal artery.  This was inflated to 10 atm for 1 minute.  The popliteal artery and the most proximal portion of the tibioperoneal trunk were then treated with a 5 mm diameter by 15 cm length Lutonix drug-coated angioplasty balloon inflated to 8 atm for 1 minute.  Completion imaging showed about a 20% residual stenosis in the distal popliteal artery and proximal tibioperoneal trunk that was not flow-limiting.  There remained occlusion of the mid to distal peroneal artery.  I then upsized to a 3.5 mm diameter angioplasty balloon peroneal artery and inflated this up to 6 atm for 1 minute.  Completion imaging showed markedly  improved flow with less than 20% residual stenosis in the peroneal artery and he now had two-vessel runoff distally.  I elected to terminate the procedure. The sheath was removed and StarClose closure device was deployed in the right femoral artery with excellent hemostatic result. The patient was taken to the recovery room in stable condition having tolerated the procedure well.  Findings:               Aortogram:  Normal aorta and iliac arteries without significant stenosis.  Renal arteries appear to have good flow without significant stenosis             Left lower Extremity:  Reasonably normal common femoral artery, profunda femoris artery, and superficial femoral artery with multiple areas of mild disease of less than 40%.  The below-knee popliteal artery and the proximal tibioperoneal trunk then had a calcific stenosis in the 70 to 80% range.  The anterior tibial artery origin was amongst this calcific disease in the anterior tibial artery  was chronically occluded.  The posterior tibial artery was then large and patent to the foot.  The peroneal artery was a good size but the mid to distal peroneal artery had a short to medium segment occlusion.   Disposition: Patient was taken to the recovery room in stable condition having tolerated the procedure well.  Complications: None  Leotis Pain 08/06/2018 10:26 AM   This note was created with Dragon Medical transcription system. Any errors in dictation are purely unintentional.

## 2018-08-07 ENCOUNTER — Other Ambulatory Visit: Payer: Self-pay | Admitting: Podiatry

## 2018-08-07 DIAGNOSIS — I96 Gangrene, not elsewhere classified: Secondary | ICD-10-CM | POA: Diagnosis not present

## 2018-08-08 ENCOUNTER — Other Ambulatory Visit: Payer: Self-pay

## 2018-08-08 ENCOUNTER — Encounter: Payer: Self-pay | Admitting: Family Medicine

## 2018-08-08 ENCOUNTER — Encounter
Admission: RE | Admit: 2018-08-08 | Discharge: 2018-08-08 | Disposition: A | Discharge status: Home / Self Care | Payer: Medicare Other | Source: Ambulatory Visit | Attending: Podiatry | Admitting: Podiatry

## 2018-08-08 DIAGNOSIS — E119 Type 2 diabetes mellitus without complications: Secondary | ICD-10-CM | POA: Diagnosis not present

## 2018-08-08 DIAGNOSIS — E1152 Type 2 diabetes mellitus with diabetic peripheral angiopathy with gangrene: Secondary | ICD-10-CM | POA: Diagnosis not present

## 2018-08-08 DIAGNOSIS — M1990 Unspecified osteoarthritis, unspecified site: Secondary | ICD-10-CM | POA: Diagnosis not present

## 2018-08-08 DIAGNOSIS — I10 Essential (primary) hypertension: Secondary | ICD-10-CM | POA: Diagnosis not present

## 2018-08-08 DIAGNOSIS — I96 Gangrene, not elsewhere classified: Secondary | ICD-10-CM | POA: Diagnosis present

## 2018-08-08 DIAGNOSIS — F17200 Nicotine dependence, unspecified, uncomplicated: Secondary | ICD-10-CM | POA: Diagnosis not present

## 2018-08-08 DIAGNOSIS — E11622 Type 2 diabetes mellitus with other skin ulcer: Secondary | ICD-10-CM | POA: Diagnosis not present

## 2018-08-08 DIAGNOSIS — Z1159 Encounter for screening for other viral diseases: Secondary | ICD-10-CM | POA: Insufficient documentation

## 2018-08-08 DIAGNOSIS — E1169 Type 2 diabetes mellitus with other specified complication: Secondary | ICD-10-CM | POA: Diagnosis not present

## 2018-08-08 DIAGNOSIS — Z01818 Encounter for other preprocedural examination: Secondary | ICD-10-CM | POA: Insufficient documentation

## 2018-08-08 DIAGNOSIS — M86172 Other acute osteomyelitis, left ankle and foot: Secondary | ICD-10-CM | POA: Diagnosis not present

## 2018-08-08 LAB — BASIC METABOLIC PANEL
Anion gap: 8 (ref 5–15)
BUN: 16 mg/dL (ref 6–20)
CO2: 27 mmol/L (ref 22–32)
Calcium: 9.1 mg/dL (ref 8.9–10.3)
Chloride: 98 mmol/L (ref 98–111)
Creatinine, Ser: 0.91 mg/dL (ref 0.61–1.24)
GFR calc Af Amer: >60 mL/min (ref >60)
GFR calc non Af Amer: >60 mL/min (ref >60)
Glucose, Bld: 104 mg/dL — ABNORMAL HIGH (ref 70–99)
Potassium: 4.6 mmol/L (ref 3.5–5.1)
Sodium: 133 mmol/L — ABNORMAL LOW (ref 135–145)

## 2018-08-08 NOTE — Patient Instructions (Signed)
Your procedure is scheduled on: 08-10-18 FRIDAY Report to Same Day Surgery 2nd floor medical mall [facility] Entrance-take elevator on left to 2nd floor.  Check in with surgery information desk.) To find out your arrival time please call [phone] between 1PM - 3PM on 08-09-18 THURSDAY  Remember: Instructions that are not followed completely may result in serious medical risk, up to and including death, or upon the discretion of your surgeon and anesthesiologist your surgery may need to be rescheduled.    _x___ 1. Do not eat food after midnight the night before your procedure. NO GUM OR CANDY AFTER MIDNIGHT. You may drink WATER up to 2 hours before you are scheduled to arrive at the hospital for your procedure.  Do not drink WATER  within 2 hours of your scheduled arrival to the hospital.  Type 1 and type 2 diabetics should only drink water.   ____Ensure clear carbohydrate drink on the way to the hospital for bariatric patients  ____Ensure clear carbohydrate drink 3 hours before surgery for Dr Dwyane Luo patients if physician instructed.    __x__ 2. No Alcohol for 24 hours before or after surgery.   __x__3. No Smoking or e-cigarettes for 24 prior to surgery.  Do not use any chewable tobacco products for at least 6 hour prior to surgery   ____  4. Bring all medications with you on the day of surgery if instructed.    __x__ 5. Notify your doctor if there is any change in your medical condition     (cold, fever, infections).    x___6. On the morning of surgery brush your teeth with toothpaste and water.  You may rinse your mouth with mouth wash if you wish.  Do not swallow any toothpaste or mouthwash.   Do not wear jewelry, make-up, hairpins, clips or nail polish.  Do not wear lotions, powders, or perfumes. You may wear deodorant.  Do not shave 48 hours prior to surgery. Men may shave face and neck.  Do not bring valuables to the hospital.    [facility] is not responsible for any  belongings or valuables.               Contacts, dentures or bridgework may not be worn into surgery.  Leave your suitcase in the car. After surgery it may be brought to your room.  For patients admitted to the hospital, discharge time is determined by your treatment team.  _  Patients discharged the day of surgery will not be allowed to drive home.  You will need someone to drive you home and stay with you the night of your procedure.    Please read over the following fact sheets that you were given:   [facility] Preparing for Surgery   _x___ TAKE THE FOLLOWING MEDICATION THE MORNING OF SURGERY WITH A SMALL SIP OF WATER. These include:  1. NORVASC (AMLODIPINE)  2. LIPITOR (ATORVASTATIN)  3. GABAPENTIN (NEURONTIN)  4. METOPROLOL  5.  6.  ____Fleets enema or Magnesium Citrate as directed.   _x___ Use CHG Soap or sage wipes as directed on instruction sheet   ____ Use inhalers on the day of surgery and bring to hospital day of surgery  _X___ Stop Metformin 2 days prior to surgery-LAST DOSE WAS ON SATURDAY    ____ Take 1/2 of usual insulin dose the night before surgery and none on the morning surgery.   _x___ Follow recommendations from Cardiologist, Pulmonologist or PCP regarding stopping Aspirin, Coumadin, Plavix ,Eliquis,  Effient, or Pradaxa, and Pletal-ASK DR [name] ABOUT STOPPING YOUR PLAVIX AND ASPIRIN  X____Stop Anti-inflammatories such as Advil, Aleve, Ibuprofen, Motrin, Naproxen, Naprosyn, Goodies powders or aspirin products NOW-OK to take Tylenol OR TRAMADOL    ____ Stop supplements until after surgery.     ____ Bring C-Pap to the hospital.

## 2018-08-09 LAB — NOVEL CORONAVIRUS, NAA (HOSP ORDER, SEND-OUT TO REF LAB; TAT 18-24 HRS): SARS-CoV-2, NAA: NOT DETECTED

## 2018-08-10 ENCOUNTER — Encounter
Admission: RE | Disposition: A | Discharge status: Home / Self Care | Payer: Self-pay | Source: Ambulatory Visit | Attending: Podiatry

## 2018-08-10 ENCOUNTER — Encounter: Payer: Self-pay | Admitting: *Deleted

## 2018-08-10 ENCOUNTER — Other Ambulatory Visit: Payer: Self-pay

## 2018-08-10 ENCOUNTER — Ambulatory Visit
Admission: RE | Admit: 2018-08-10 | Discharge: 2018-08-10 | Disposition: A | Discharge status: Home / Self Care | Payer: Medicare Other | Source: Ambulatory Visit | Attending: Podiatry | Admitting: Podiatry

## 2018-08-10 ENCOUNTER — Ambulatory Visit: Payer: Medicare Other | Admitting: Registered Nurse

## 2018-08-10 ENCOUNTER — Encounter: Payer: Self-pay | Admitting: Family Medicine

## 2018-08-10 DIAGNOSIS — Z1159 Encounter for screening for other viral diseases: Secondary | ICD-10-CM | POA: Diagnosis not present

## 2018-08-10 DIAGNOSIS — E1169 Type 2 diabetes mellitus with other specified complication: Secondary | ICD-10-CM | POA: Diagnosis not present

## 2018-08-10 DIAGNOSIS — I96 Gangrene, not elsewhere classified: Secondary | ICD-10-CM | POA: Diagnosis not present

## 2018-08-10 DIAGNOSIS — E1152 Type 2 diabetes mellitus with diabetic peripheral angiopathy with gangrene: Secondary | ICD-10-CM | POA: Insufficient documentation

## 2018-08-10 DIAGNOSIS — M86172 Other acute osteomyelitis, left ankle and foot: Secondary | ICD-10-CM | POA: Diagnosis not present

## 2018-08-10 DIAGNOSIS — I10 Essential (primary) hypertension: Secondary | ICD-10-CM | POA: Insufficient documentation

## 2018-08-10 DIAGNOSIS — E785 Hyperlipidemia, unspecified: Secondary | ICD-10-CM | POA: Diagnosis not present

## 2018-08-10 DIAGNOSIS — E11622 Type 2 diabetes mellitus with other skin ulcer: Secondary | ICD-10-CM | POA: Insufficient documentation

## 2018-08-10 DIAGNOSIS — M1990 Unspecified osteoarthritis, unspecified site: Secondary | ICD-10-CM | POA: Insufficient documentation

## 2018-08-10 DIAGNOSIS — F17200 Nicotine dependence, unspecified, uncomplicated: Secondary | ICD-10-CM | POA: Insufficient documentation

## 2018-08-10 HISTORY — PX: AMPUTATION TOE: SHX6595

## 2018-08-10 LAB — GLUCOSE, CAPILLARY
Glucose-Capillary: 93 mg/dL (ref 70–99)
Glucose-Capillary: 107 mg/dL — ABNORMAL HIGH (ref 70–99)

## 2018-08-10 SURGERY — AMPUTATION, TOE
Anesthesia: General | Laterality: Left

## 2018-08-10 MED ORDER — METOCLOPRAMIDE HCL 5 MG/ML IJ SOLN: 5.0000 mg | Freq: Three times a day (TID) | INTRAMUSCULAR | Status: DC | PRN

## 2018-08-10 MED ORDER — PROPOFOL 10 MG/ML IV BOLUS
INTRAVENOUS | Status: DC | PRN
  Administered 2018-08-10: 200 mg via INTRAVENOUS

## 2018-08-10 MED ORDER — CEFAZOLIN SODIUM-DEXTROSE 2-4 GM/100ML-% IV SOLN
INTRAVENOUS | Status: AC
  Filled 2018-08-10: qty 100

## 2018-08-10 MED ORDER — DEXAMETHASONE SODIUM PHOSPHATE 10 MG/ML IJ SOLN
INTRAMUSCULAR | Status: AC
  Filled 2018-08-10: qty 1

## 2018-08-10 MED ORDER — MIDAZOLAM HCL 2 MG/2ML IJ SOLN
INTRAMUSCULAR | Status: DC | PRN
  Administered 2018-08-10: 2 mg via INTRAVENOUS

## 2018-08-10 MED ORDER — SUCCINYLCHOLINE CHLORIDE 20 MG/ML IJ SOLN
INTRAMUSCULAR | Status: DC | PRN
  Administered 2018-08-10: 200 mg via INTRAVENOUS

## 2018-08-10 MED ORDER — ACETAMINOPHEN 10 MG/ML IV SOLN
INTRAVENOUS | Status: AC
  Filled 2018-08-10: qty 100

## 2018-08-10 MED ORDER — FENTANYL CITRATE (PF) 100 MCG/2ML IJ SOLN
INTRAMUSCULAR | Status: AC
  Filled 2018-08-10: qty 2

## 2018-08-10 MED ORDER — HYDROCODONE-ACETAMINOPHEN 5-325 MG PO TABS: 1.0000 | ORAL_TABLET | Freq: Four times a day (QID) | ORAL | 0 refills | Status: DC | PRN

## 2018-08-10 MED ORDER — ONDANSETRON HCL 4 MG/2ML IJ SOLN
INTRAMUSCULAR | Status: DC | PRN
  Administered 2018-08-10: 4 mg via INTRAVENOUS

## 2018-08-10 MED ORDER — ONDANSETRON HCL 4 MG/2ML IJ SOLN: 4.0000 mg | Freq: Once | INTRAMUSCULAR | Status: DC | PRN

## 2018-08-10 MED ORDER — ONDANSETRON HCL 4 MG/2ML IJ SOLN: 4.0000 mg | Freq: Four times a day (QID) | INTRAMUSCULAR | Status: DC | PRN

## 2018-08-10 MED ORDER — ONDANSETRON HCL 4 MG/2ML IJ SOLN
INTRAMUSCULAR | Status: AC
  Filled 2018-08-10: qty 2

## 2018-08-10 MED ORDER — FENTANYL CITRATE (PF) 100 MCG/2ML IJ SOLN: 25.0000 ug | INTRAMUSCULAR | Status: DC | PRN

## 2018-08-10 MED ORDER — MIDAZOLAM HCL 2 MG/2ML IJ SOLN
INTRAMUSCULAR | Status: AC
  Filled 2018-08-10: qty 2

## 2018-08-10 MED ORDER — EPHEDRINE SULFATE 50 MG/ML IJ SOLN
INTRAMUSCULAR | Status: DC | PRN
  Administered 2018-08-10 (×3): 5 mg via INTRAVENOUS
  Administered 2018-08-10: 10 mg via INTRAVENOUS

## 2018-08-10 MED ORDER — DEXAMETHASONE SODIUM PHOSPHATE 10 MG/ML IJ SOLN
INTRAMUSCULAR | Status: DC | PRN
  Administered 2018-08-10: 4 mg via INTRAVENOUS

## 2018-08-10 MED ORDER — BUPIVACAINE-EPINEPHRINE (PF) 0.25% -1:200000 IJ SOLN
INTRAMUSCULAR | Status: DC | PRN
  Administered 2018-08-10: 15 mL

## 2018-08-10 MED ORDER — ONDANSETRON HCL 4 MG PO TABS: 4.0000 mg | ORAL_TABLET | Freq: Four times a day (QID) | ORAL | Status: DC | PRN

## 2018-08-10 MED ORDER — LIDOCAINE HCL (CARDIAC) PF 100 MG/5ML IV SOSY
PREFILLED_SYRINGE | INTRAVENOUS | Status: DC | PRN
  Administered 2018-08-10: 100 mg via INTRAVENOUS

## 2018-08-10 MED ORDER — PROPOFOL 10 MG/ML IV BOLUS
INTRAVENOUS | Status: AC
  Filled 2018-08-10: qty 20

## 2018-08-10 MED ORDER — FAMOTIDINE 20 MG PO TABS
ORAL_TABLET | ORAL | Status: AC
  Filled 2018-08-10: qty 1

## 2018-08-10 MED ORDER — ACETAMINOPHEN 10 MG/ML IV SOLN
INTRAVENOUS | Status: DC | PRN
  Administered 2018-08-10: 1000 mg via INTRAVENOUS

## 2018-08-10 MED ORDER — FENTANYL CITRATE (PF) 100 MCG/2ML IJ SOLN
INTRAMUSCULAR | Status: DC | PRN
  Administered 2018-08-10: 50 ug via INTRAVENOUS
  Administered 2018-08-10 (×4): 25 ug via INTRAVENOUS

## 2018-08-10 MED ORDER — FAMOTIDINE 20 MG PO TABS
20.0000 mg | ORAL_TABLET | Freq: Once | ORAL | Status: AC
  Administered 2018-08-10: 09:00:00 20 mg via ORAL

## 2018-08-10 MED ORDER — ROCURONIUM BROMIDE 50 MG/5ML IV SOLN
INTRAVENOUS | Status: AC
  Filled 2018-08-10: qty 1

## 2018-08-10 MED ORDER — POVIDONE-IODINE 7.5 % EX SOLN
Freq: Once | CUTANEOUS | Status: DC
  Filled 2018-08-10: qty 118

## 2018-08-10 MED ORDER — SODIUM CHLORIDE 0.9 % IV SOLN
INTRAVENOUS | Status: DC
  Administered 2018-08-10: 09:00:00 via INTRAVENOUS

## 2018-08-10 MED ORDER — CEFAZOLIN SODIUM-DEXTROSE 2-4 GM/100ML-% IV SOLN
2.0000 g | INTRAVENOUS | Status: AC
  Administered 2018-08-10: 2 g via INTRAVENOUS

## 2018-08-10 MED ORDER — PHENYLEPHRINE HCL (PRESSORS) 10 MG/ML IV SOLN
INTRAVENOUS | Status: DC | PRN
  Administered 2018-08-10 (×5): 100 ug via INTRAVENOUS
  Administered 2018-08-10: 50 ug via INTRAVENOUS

## 2018-08-10 MED ORDER — LIDOCAINE HCL (PF) 2 % IJ SOLN
INTRAMUSCULAR | Status: AC
  Filled 2018-08-10: qty 10

## 2018-08-10 MED ORDER — METOCLOPRAMIDE HCL 10 MG PO TABS: 5.0000 mg | ORAL_TABLET | Freq: Three times a day (TID) | ORAL | Status: DC | PRN

## 2018-08-10 SURGICAL SUPPLY — 45 items
BANDAGE ELASTIC 4 LF NS (GAUZE/BANDAGES/DRESSINGS) ×2 IMPLANT
BLADE OSC/SAGITTAL MD 5.5X18 (BLADE) ×2 IMPLANT
BLADE SURG MINI STRL (BLADE) ×2 IMPLANT
BNDG CONFORM 2 STRL LF (GAUZE/BANDAGES/DRESSINGS) ×2 IMPLANT
BNDG CONFORM 3 STRL LF (GAUZE/BANDAGES/DRESSINGS) ×4 IMPLANT
BNDG ESMARK 4X12 TAN STRL LF (GAUZE/BANDAGES/DRESSINGS) IMPLANT
BNDG GAUZE 4.5X4.1 6PLY STRL (MISCELLANEOUS) ×2 IMPLANT
CANISTER SUCT 1200ML W/VALVE (MISCELLANEOUS) ×2 IMPLANT
COVER WAND RF STERILE (DRAPES) ×2 IMPLANT
CUFF TOURN SGL QUICK 12 (TOURNIQUET CUFF) IMPLANT
CUFF TOURN SGL QUICK 18X4 (TOURNIQUET CUFF) ×2 IMPLANT
DRAPE FLUOR MINI C-ARM 54X84 (DRAPES) ×2 IMPLANT
DRAPE XRAY CASSETTE 23X24 (DRAPES) IMPLANT
DURAPREP 26ML APPLICATOR (WOUND CARE) ×2 IMPLANT
ELECT REM PT RETURN 9FT ADLT (ELECTROSURGICAL) ×2
ELECTRODE REM PT RTRN 9FT ADLT (ELECTROSURGICAL) ×1 IMPLANT
GAUZE PACKING IODOFORM 1/2 (PACKING) ×2 IMPLANT
GAUZE SPONGE 4X4 12PLY STRL (GAUZE/BANDAGES/DRESSINGS) ×2 IMPLANT
GAUZE XEROFORM 1X8 LF (GAUZE/BANDAGES/DRESSINGS) ×2 IMPLANT
GLOVE BIO SURGEON STRL SZ7.5 (GLOVE) ×2 IMPLANT
GLOVE INDICATOR 8.0 STRL GRN (GLOVE) ×2 IMPLANT
GOWN STRL REUS W/ TWL LRG LVL3 (GOWN DISPOSABLE) ×2 IMPLANT
GOWN STRL REUS W/TWL LRG LVL3 (GOWN DISPOSABLE) ×2
HANDPIECE VERSAJET DEBRIDEMENT (MISCELLANEOUS) ×2 IMPLANT
KIT TURNOVER KIT A (KITS) ×2 IMPLANT
LABEL OR SOLS (LABEL) ×2 IMPLANT
NEEDLE FILTER BLUNT 18X 1/2SAF (NEEDLE)
NEEDLE FILTER BLUNT 18X1 1/2 (NEEDLE) IMPLANT
NEEDLE HYPO 25X1 1.5 SAFETY (NEEDLE) ×2 IMPLANT
NS IRRIG 500ML POUR BTL (IV SOLUTION) ×2 IMPLANT
PACK EXTREMITY ARMC (MISCELLANEOUS) ×2 IMPLANT
PAD ABD DERMACEA PRESS 5X9 (GAUZE/BANDAGES/DRESSINGS) ×4 IMPLANT
PULSAVAC PLUS IRRIG FAN TIP (DISPOSABLE)
SHIELD FULL FACE ANTIFOG 7M (MISCELLANEOUS) ×2 IMPLANT
SOL .9 NS 3000ML IRR  AL (IV SOLUTION)
SOL .9 NS 3000ML IRR UROMATIC (IV SOLUTION) IMPLANT
STOCKINETTE M/LG 89821 (MISCELLANEOUS) ×2 IMPLANT
STRAP SAFETY 5IN WIDE (MISCELLANEOUS) ×2 IMPLANT
SUT ETHILON 2 0 FS 18 (SUTURE) ×6 IMPLANT
SUT ETHILON 3-0 FS-10 30 BLK (SUTURE) ×2
SUT ETHILON 5-0 FS-2 18 BLK (SUTURE) IMPLANT
SUT VIC AB 4-0 FS2 27 (SUTURE) IMPLANT
SUTURE EHLN 3-0 FS-10 30 BLK (SUTURE) ×1 IMPLANT
SYR 10ML LL (SYRINGE) ×6 IMPLANT
TIP FAN IRRIG PULSAVAC PLUS (DISPOSABLE) IMPLANT

## 2018-08-10 NOTE — Discharge Instructions (Addendum)
[facility] °[facility] ° °POST OPERATIVE INSTRUCTIONS FOR DR. TROXLER AND DR. FOWLER °[facility] ° ° °1. Take your medication as prescribed.  Pain medication should be taken only as needed. ° °2. Keep the dressing clean, dry and intact. ° °3. Keep your foot elevated above the heart level for the first 48 hours. ° °4. Walking to the bathroom and brief periods of walking are acceptable, unless we have instructed you to be non-weight bearing. ° °5. Always wear your post-op shoe when walking.  Always use your crutches if you are to be non-weight bearing. ° °6. Do not take a shower. Baths are permissible as long as the foot is kept out of the water.  ° °7. Every hour you are awake:  °- Bend your knee 15 times. °- Flex foot 15 times °- Massage calf 15 times ° °8. Call [facility] ([phone]) if any of the following problems occur: °- You develop a temperature or fever. °- The bandage becomes saturated with blood. °- Medication does not stop your pain. °- Injury of the foot occurs. °- Any symptoms of infection including redness, odor, or red streaks running from wound. ° ° ° ° °AMBULATORY SURGERY  °DISCHARGE INSTRUCTIONS ° ° °1) The drugs that you were given will stay in your system until tomorrow so for the next 24 hours you should not: ° °A) Drive an automobile °B) Make any legal decisions °C) Drink any alcoholic beverage ° ° °2) You may resume regular meals tomorrow.  Today it is better to start with liquids and gradually work up to solid foods. ° °You may eat anything you prefer, but it is better to start with liquids, then soup and crackers, and gradually work up to solid foods. ° ° °3) Please notify your doctor immediately if you have any unusual bleeding, trouble breathing, redness and pain at the surgery site, drainage, fever, or pain not relieved by medication. ° ° ° °4) Additional Instructions: ° ° ° ° ° ° ° °Please contact your physician with any  problems or Same Day Surgery at [phone], Monday through Friday 6 am to 4 pm, or [place] [facility] number at [phone]. ° °

## 2018-08-10 NOTE — Anesthesia Preprocedure Evaluation (Signed)
Anesthesia Evaluation  Patient identified by MRN, date of birth, ID band Patient awake    Reviewed: Allergy & Precautions, NPO status , Patient's Chart, lab work & pertinent test results  Airway Mallampati: III       Dental   Pulmonary Current Smoker,    Pulmonary exam normal        Cardiovascular hypertension, Pt. on medications + Peripheral Vascular Disease  Normal cardiovascular exam     Neuro/Psych  Neuromuscular disease negative psych ROS   GI/Hepatic negative GI ROS, Neg liver ROS,   Endo/Other  diabetes  Renal/GU negative Renal ROS  negative genitourinary   Musculoskeletal  (+) Arthritis , Osteoarthritis,    Abdominal Normal abdominal exam  (+)   Peds negative pediatric ROS (+)  Hematology negative hematology ROS (+)   Anesthesia Other Findings   Reproductive/Obstetrics                             Anesthesia Physical Anesthesia Plan  ASA: III  Anesthesia Plan: General   Post-op Pain Management:    Induction: Intravenous  PONV Risk Score and Plan:   Airway Management Planned: Oral ETT  Additional Equipment:   Intra-op Plan:   Post-operative Plan:   Informed Consent: I have reviewed the patients History and Physical, chart, labs and discussed the procedure including the risks, benefits and alternatives for the proposed anesthesia with the patient or authorized representative who has indicated his/her understanding and acceptance.     Dental advisory given  Plan Discussed with: CRNA and Surgeon  Anesthesia Plan Comments:         Anesthesia Quick Evaluation

## 2018-08-10 NOTE — Anesthesia Post-op Follow-up Note (Signed)
Anesthesia QCDR form completed.        

## 2018-08-10 NOTE — Op Note (Signed)
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  Local consisted of 15 mL's of 0.5% bupivacaine with epinephrine    Hemostasis: Epinephrine infiltrated along the incision site    Specimen: Necrotic fifth ray    Complications: None    Operative indications:Joseph Singh is an 58 y.o. that presents today for surgical intervention.  The risks/benefits/alternatives/complications have been discussed and consent has been given.    Procedure:  Patient was brought into the OR and placed on the operating table in thesupine position. After anesthesia was obtained theleft lower extremity was prepped and draped in usual sterile fashion.  Attention was directed to the left foot where necrotic tissue was noted course on the fifth ray with exposed capsule and bone to the fifth metatarsal phalangeal joint region.  Full-thickness incision was taken down to the bone.  This removed all of the necrotic tissue.  The fifth ray was then removed from the surgical field including the toe.  Further residual mottling of tissue and removal of the nonviable tissue was performed sharply with a 15 blade.  All bleeders were Bovie cauterized.  The wound was flushed with copious amounts of irrigation.  I was able to perform primary closure of the incision site.  A bulky sterile dressing was applied to the left foot.  Patient was instructed on nonweightbearing to his left foot in the preoperative holding area.  Patient has tramadol for pain.    Patient tolerated the procedure and anesthesia well.  Was transported from the OR to the PACU with all vital signs stable and vascular status intact. To be discharged per routine protocol.  Will follow up in approximately 1 week in the outpatient clinic.

## 2018-08-10 NOTE — Anesthesia Procedure Notes (Signed)
Procedure Name: Intubation Date/Time: 08/10/2018 9:57 AM Performed by: Caryl Asp, CRNA Pre-anesthesia Checklist: Patient identified, Emergency Drugs available, Suction available and Patient being monitored Patient Re-evaluated:Patient Re-evaluated prior to induction Oxygen Delivery Method: Circle system utilized Preoxygenation: Pre-oxygenation with 100% oxygen Induction Type: IV induction Ventilation: Mask ventilation with difficulty and Oral airway inserted - appropriate to patient size Laryngoscope Size: Mac and 4 Grade View: Grade II Tube type: Oral Tube size: 7.5 mm Number of attempts: 2 (CRNA attempt 1, Dr. Kayleen Memos successful nd attempt) Airway Equipment and Method: Stylet and Oral airway Placement Confirmation: ETT inserted through vocal cords under direct vision,  positive ETCO2 and breath sounds checked- equal and bilateral Secured at: 22 cm Tube secured with: Tape Dental Injury: Teeth and Oropharynx as per pre-operative assessment

## 2018-08-10 NOTE — Anesthesia Postprocedure Evaluation (Signed)
Anesthesia Post Note  Patient: Joseph Singh  Procedure(s) Performed: RAY LEFT (Left )  Patient location during evaluation: PACU Anesthesia Type: General Level of consciousness: awake and alert and oriented Pain management: pain level controlled Vital Signs Assessment: post-procedure vital signs reviewed and stable Respiratory status: spontaneous breathing Cardiovascular status: blood pressure returned to baseline Anesthetic complications: no     Last Vitals:  Vitals:   08/10/18 1156 08/10/18 1228  BP: (!) 115/54 (!) 123/59  Pulse: 65 (!) 58  Resp: 16 16  Temp:    SpO2: 97% 95%    Last Pain:  Vitals:   08/10/18 1228  TempSrc:   PainSc: 0-No pain                 Olivia Pavelko

## 2018-08-10 NOTE — Transfer of Care (Signed)
Immediate Anesthesia Transfer of Care Note  Patient: Joseph Singh  Procedure(s) Performed: RAY LEFT (Left )  Patient Location: PACU  Anesthesia Type:General  Level of Consciousness: awake, alert  and oriented  Airway & Oxygen Therapy: Patient Spontanous Breathing and Patient connected to face mask oxygen  Post-op Assessment: Report given to RN, Post -op Vital signs reviewed and stable and Patient moving all extremities X 4  Post vital signs: Reviewed and stable  Last Vitals:  Vitals Value Taken Time  BP 124/69 08/10/18 1116  Temp 36.3 C 08/10/18 1116  Pulse 73 08/10/18 1123  Resp 19 08/10/18 1123  SpO2 100 % 08/10/18 1123  Vitals shown include unvalidated device data.  Last Pain:  Vitals:   08/10/18 0823  TempSrc: Temporal  PainSc: 0-No pain         Complications: No apparent anesthesia complications

## 2018-08-12 ENCOUNTER — Other Ambulatory Visit: Payer: Self-pay | Admitting: Family Medicine

## 2018-08-14 LAB — SURGICAL PATHOLOGY

## 2018-08-15 ENCOUNTER — Other Ambulatory Visit: Payer: Self-pay | Admitting: Family Medicine

## 2018-08-15 DIAGNOSIS — M5442 Lumbago with sciatica, left side: Secondary | ICD-10-CM

## 2018-08-15 DIAGNOSIS — G8929 Other chronic pain: Secondary | ICD-10-CM

## 2018-08-21 ENCOUNTER — Other Ambulatory Visit: Payer: Self-pay | Admitting: Family Medicine

## 2018-09-04 ENCOUNTER — Other Ambulatory Visit (INDEPENDENT_AMBULATORY_CARE_PROVIDER_SITE_OTHER): Payer: Self-pay | Admitting: Vascular Surgery

## 2018-09-04 DIAGNOSIS — Z9862 Peripheral vascular angioplasty status: Secondary | ICD-10-CM

## 2018-09-04 DIAGNOSIS — I70249 Atherosclerosis of native arteries of left leg with ulceration of unspecified site: Secondary | ICD-10-CM

## 2018-09-05 ENCOUNTER — Encounter (INDEPENDENT_AMBULATORY_CARE_PROVIDER_SITE_OTHER): Payer: Self-pay | Admitting: Nurse Practitioner

## 2018-09-05 ENCOUNTER — Ambulatory Visit (INDEPENDENT_AMBULATORY_CARE_PROVIDER_SITE_OTHER): Payer: Medicare Other | Admitting: Nurse Practitioner

## 2018-09-05 ENCOUNTER — Other Ambulatory Visit: Payer: Self-pay

## 2018-09-05 ENCOUNTER — Ambulatory Visit (INDEPENDENT_AMBULATORY_CARE_PROVIDER_SITE_OTHER): Payer: Medicare Other

## 2018-09-05 VITALS — BP 139/78 | HR 65 | Resp 16 | Ht 76.0 in | Wt 301.0 lb

## 2018-09-05 DIAGNOSIS — E1165 Type 2 diabetes mellitus with hyperglycemia: Secondary | ICD-10-CM

## 2018-09-05 DIAGNOSIS — I739 Peripheral vascular disease, unspecified: Secondary | ICD-10-CM | POA: Diagnosis not present

## 2018-09-05 DIAGNOSIS — F17200 Nicotine dependence, unspecified, uncomplicated: Secondary | ICD-10-CM

## 2018-09-05 DIAGNOSIS — E1151 Type 2 diabetes mellitus with diabetic peripheral angiopathy without gangrene: Secondary | ICD-10-CM | POA: Diagnosis not present

## 2018-09-05 DIAGNOSIS — Z9862 Peripheral vascular angioplasty status: Secondary | ICD-10-CM | POA: Diagnosis not present

## 2018-09-05 DIAGNOSIS — I10 Essential (primary) hypertension: Secondary | ICD-10-CM | POA: Diagnosis not present

## 2018-09-05 DIAGNOSIS — Z79899 Other long term (current) drug therapy: Secondary | ICD-10-CM

## 2018-09-05 DIAGNOSIS — F17211 Nicotine dependence, cigarettes, in remission: Secondary | ICD-10-CM

## 2018-09-05 DIAGNOSIS — IMO0002 Reserved for concepts with insufficient information to code with codable children: Secondary | ICD-10-CM

## 2018-09-05 DIAGNOSIS — I70249 Atherosclerosis of native arteries of left leg with ulceration of unspecified site: Secondary | ICD-10-CM

## 2018-09-05 DIAGNOSIS — Z7984 Long term (current) use of oral hypoglycemic drugs: Secondary | ICD-10-CM

## 2018-09-05 NOTE — Progress Notes (Signed)
SUBJECTIVE:  Patient ID: Joseph Singh, male    DOB: [birth year], 58 y.o.   MRN: [id number] Chief Complaint  Patient presents with   Follow-up    ultrasound follow up    HPI  Joseph Singh is a 58 y.o. male  The patient returns to the office for followup and review of the noninvasive studies. There have been no interval changes in lower extremity symptoms. No interval shortening of the patient's claudication distance or development of rest pain symptoms. No new ulcers or wounds have occurred since the last visit. His current wound is currently healing well, although some debridement may be necessary.  There have been no significant changes to the patient's overall health care.  The patient denies amaurosis fugax or recent TIA symptoms. There are no recent neurological changes noted. The patient denies history of DVT, PE or superficial thrombophlebitis. The patient denies recent episodes of angina or shortness of breath.   ABI Rt=0.94 and Lt=0.97  (previous ABI's Rt=1.17 and Lt=0.88) Duplex ultrasound of the lower extremities reveals biphasic waveforms of the right posterior tibial artery triphasic waveforms in the peroneal.  The left lower extremity has biphasic waveforms within the anterior tibial artery and triphasic waveforms within the posterior tibial artery.  Past Medical History:  Diagnosis Date   Arthritis    Diabetes mellitus without complication ([place])    History of kidney stones    h/o   Hyperlipidemia    Hypertension    Neuropathy     Past Surgical History:  Procedure Laterality Date   AMPUTATION TOE Left 08/10/2018   Procedure: RAY LEFT;  Surgeon: Samara Deist, DPM;  Location: [facility];  Service: Podiatry;  Laterality: Left;   LOWER EXTREMITY ANGIOGRAPHY Right 02/25/2016   Procedure: Lower Extremity Angiography;  Surgeon: Algernon Huxley, MD;  Location: [place] [facility];  Service: Cardiovascular;  Laterality: Right;   LOWER EXTREMITY ANGIOGRAPHY Right  02/27/2017   Procedure: LOWER EXTREMITY ANGIOGRAPHY;  Surgeon: Algernon Huxley, MD;  Location: [place] [facility];  Service: Cardiovascular;  Laterality: Right;   LOWER EXTREMITY ANGIOGRAPHY Left 08/06/2018   Procedure: LOWER EXTREMITY ANGIOGRAPHY;  Surgeon: Algernon Huxley, MD;  Location: [place] [facility];  Service: Cardiovascular;  Laterality: Left;   LOWER EXTREMITY INTERVENTION  02/25/2016   Procedure: Lower Extremity Intervention;  Surgeon: Algernon Huxley, MD;  Location: [place] [facility];  Service: Cardiovascular;;   TOE AMPUTATION      Social History   Socioeconomic History   Marital status: Married    Spouse name: Not on file   Number of children: Not on file   Years of education: Not on file   Highest education level: Not on file  Occupational History   Not on file  Social Needs   Financial resource strain: Not hard at all   Food insecurity    Worry: Never true    Inability: Never true   Transportation needs    Medical: No    Non-medical: No  Tobacco Use   Smoking status: Former Smoker    Packs/day: 0.50    Years: 35.00    Pack years: 17.50    Types: Cigarettes    Quit date: 08/09/2018    Years since quitting: 0.0   Smokeless tobacco: Never Used  Substance and Sexual Activity   Alcohol use: No    Alcohol/week: 0.0 standard drinks   Drug use: No   Sexual activity: Yes    Partners: Female  Comment: Wife  Lifestyle   Physical activity    Days per week: 3 days    Minutes per session: 20 min   Stress: Not at all  Relationships   Social connections    Talks on phone: Not on file    Gets together: Not on file    Attends religious service: Not on file    Active member of club or organization: Not on file    Attends meetings of clubs or organizations: Not on file    Relationship status: Married   Intimate partner violence    Fear of current or ex partner: No    Emotionally abused: No    Physically abused: No    Forced sexual activity: No    Other Topics Concern   Not on file  Social History Narrative   Permanently disabled   [place] on the job and hurt his shoulder    Lives with wife and 2 children    1 grandchild from a previous marriage    Pets: Not inside    GED    Right handed    Caffeine- 2-3 sodas, tea, no coffee    Enjoys watching tv and being outside     Family History  Problem Relation Age of Onset   Diabetes Mother    Heart disease Father    Diabetes Sister    Diabetes Brother    Cancer Sister        lung   Cancer Sister        breast    No Known Allergies   Review of Systems   Review of Systems: Negative Unless Checked Constitutional: '[]'$ Weight loss  '[]'$ Fever  '[]'$ Chills Cardiac: '[]'$ Chest pain   '[]'$  Atrial Fibrillation  '[]'$ Palpitations   '[]'$ Shortness of breath when laying flat   '[]'$ Shortness of breath with exertion. '[]'$ Shortness of breath at rest Vascular:  '[]'$ Pain in legs with walking   '[]'$ Pain in legs with standing '[]'$ Pain in legs when laying flat   '[]'$ Claudication    '[]'$ Pain in feet when laying flat    '[]'$ History of DVT   '[]'$ Phlebitis   '[]'$ Swelling in legs   '[]'$ Varicose veins   '[]'$ Non-healing ulcers Pulmonary:   '[]'$ Uses home oxygen   '[]'$ Productive cough   '[]'$ Hemoptysis   '[]'$ Wheeze  '[]'$ COPD   '[]'$ Asthma Neurologic:  '[]'$ Dizziness   '[]'$ Seizures  '[]'$ Blackouts '[]'$ History of stroke   '[]'$ History of TIA  '[]'$ Aphasia   '[]'$ Temporary Blindness   '[]'$ Weakness or numbness in arm   '[]'$ Weakness or numbness in leg Musculoskeletal:   '[]'$ Joint swelling   '[]'$ Joint pain   '[x]'$ Low back pain  '[]'$  History of Knee Replacement '[]'$ Arthritis '[]'$ back Surgeries  '[]'$  Spinal Stenosis    Hematologic:  '[]'$ Easy bruising  '[]'$ Easy bleeding   '[]'$ Hypercoagulable state   '[]'$ Anemic Gastrointestinal:  '[]'$ Diarrhea   '[]'$ Vomiting  '[]'$ Gastroesophageal reflux/heartburn   '[]'$ Difficulty swallowing. '[]'$ Abdominal pain Genitourinary:  '[]'$ Chronic kidney disease   '[]'$ Difficult urination  '[]'$ Anuric   '[]'$ Blood in urine '[]'$ Frequent urination  '[]'$ Burning with urination   '[]'$ Hematuria Skin:  '[]'$ Rashes    '[]'$ Ulcers '[x]'$ Wounds Psychological:  '[]'$ History of anxiety   '[]'$  History of major depression  '[]'$  Memory Difficulties      OBJECTIVE:   Physical Exam  BP 139/78 (BP Location: Right Arm)    Pulse 65    Resp 16    Ht '6\' 4"'$  (1.93 m)    Wt (!) 301 lb (136.5 kg)    BMI 36.64 kg/m   Gen: WD/WN, NAD Head: [place]/AT, No temporalis wasting.  Ear/Nose/Throat:  Hearing grossly intact, nares w/o erythema or drainage Eyes: PER, EOMI, sclera nonicteric.  Neck: Supple, no masses.  No JVD.  Pulmonary:  Good air movement, no use of accessory muscles.  Cardiac: RRR Vascular:  Vessel Right Left  Radial Palpable Palpable  Dorsalis Pedis Trace Palpable Trace Palpable  Posterior Tibial Trace Palpable Trace Palpable   Gastrointestinal: soft, non-distended. No guarding/no peritoneal signs.  Musculoskeletal: M/S 5/5 throughout.  Left fifth toe amputation Neurologic: Pain and light touch intact in extremities.  Symmetrical.  Speech is fluent. Motor exam as listed above. Psychiatric: Judgment intact, Mood & affect appropriate for pt's clinical situation. Dermatologic: No Venous rashes. No Ulcers Noted.  No changes consistent with cellulitis. Lymph : No Cervical lymphadenopathy, no lichenification or skin changes of chronic lymphedema.       ASSESSMENT AND PLAN:  1. Peripheral vascular disease ([place]) Recommend:  The patient is status post successful angiogram with intervention.  The patient reports that the claudication symptoms and leg pain is essentially gone.   The patient denies lifestyle limiting changes at this point in time.  No further invasive studies, angiography or surgery at this time The patient should continue walking and begin a more formal exercise program.  The patient should continue antiplatelet therapy and aggressive treatment of the lipid abnormalities  The patient should continue wearing graduated compression socks 10-15 mmHg strength to control the mild edema.  Patient should undergo  noninvasive studies as ordered. The patient will follow up with me after the studies.   The patient will follow-up in 3 months with noninvasive studies - VAS [place] ABI WITH/WO TBI; Future  2. Essential hypertension Continue antihypertensive medications as already ordered, these medications have been reviewed and there are no changes at this time.  3. DM (diabetes mellitus) type II uncontrolled, periph vascular disorder [name] Continue hypoglycemic medications as already ordered, these medications have been reviewed and there are no changes at this time.  Hgb A1C to be monitored as already arranged by primary service   4. Tobacco use disorder Patient continues to abstain from smoking.  Continued abstinence was encouraged.   Current Outpatient Medications on File Prior to Visit  Medication Sig Dispense Refill   amLODipine (NORVASC) 10 MG tablet TAKE ONE (1) TABLET EACH DAY (Patient taking differently: Take 10 mg by mouth every morning. ) 90 tablet 3   aspirin EC 81 MG tablet Take 1 tablet (81 mg total) by mouth daily. 150 tablet 2   atorvastatin (LIPITOR) 40 MG tablet TAKE ONE (1) TABLET EACH DAY (Patient taking differently: Take 40 mg by mouth every morning. ) 90 tablet 3   blood glucose meter kit and supplies KIT E11.51, check 2x daily, dispense based on patient and insurance preference 1 each 0   cephALEXin (KEFLEX) 500 MG capsule Take 1 capsule (500 mg total) by mouth 4 (four) times daily. 40 capsule 0   clopidogrel (PLAVIX) 75 MG tablet TAKE ONE (1) TABLET EACH DAY 90 tablet 3   cyclobenzaprine (FLEXERIL) 10 MG tablet TAKE ONE TABLET BY MOUTH 3 TIMES DAILY AS NEEDED FOR MUSCLE SPASMS. (Patient taking differently: Take 10 mg by mouth 3 (three) times daily as needed for muscle spasms. ) 30 tablet 1   empagliflozin (JARDIANCE) 25 MG TABS tablet Take 25 mg by mouth daily. 180 tablet 1   gabapentin (NEURONTIN) 300 MG capsule Take 1 capsule (300 mg total) by mouth 4 (four) times daily.  360 capsule 2   glipiZIDE (GLUCOTROL XL) 10 MG 24 hr  tablet TAKE ONE (1) TABLET EACH DAY WITH BREAKFAST 90 tablet 3   glucose blood (ONE TOUCH ULTRA TEST) test strip Check blood sugar twice daily Dx code: E11.9 200 each 0   HYDROcodone-acetaminophen (NORCO) 5-325 MG tablet Take 1 tablet by mouth every 6 (six) hours as needed for moderate pain. 30 tablet 0   lisinopril-hydrochlorothiazide (PRINZIDE,ZESTORETIC) 20-25 MG tablet TAKE 1 TABLET BY MOUTH EVERY DAY (Patient taking differently: Take 0.5 tablets by mouth daily. ) 90 tablet 3   metFORMIN (GLUCOPHAGE) 1000 MG tablet TAKE 1 TABLET BY MOUTH TWICE DAILY WITH A MEAL (Patient taking differently: Take 1,000 mg by mouth 2 (two) times daily with a meal. ) 180 tablet 3   metoprolol succinate (TOPROL-XL) 50 MG 24 hr tablet Take 1 tablet (50 mg total) by mouth daily. (Patient taking differently: Take 50 mg by mouth every morning. ) 90 tablet 1   sulfamethoxazole-trimethoprim (BACTRIM DS) 800-160 MG tablet Take 1 tablet by mouth 2 (two) times daily. for 10 days     TRADJENTA 5 MG TABS tablet TAKE 1 TABLET BY MOUTH EVERY DAY 30 tablet 3   traMADol (ULTRAM) 50 MG tablet TAKE 1 TABLET BY MOUTH THREE TIMES A DAY 90 tablet 0   mupirocin ointment (BACTROBAN) 2 % Place thin layer of ointment over red area of skin on left leg 2 times per day for 7 days. (Patient not taking: Reported on 08/07/2018) 22 g 0   Vitamin D, Ergocalciferol, (DRISDOL) 50000 units CAPS capsule Take 1 capsule (50,000 Units total) by mouth every 7 (seven) days. (Patient not taking: Reported on 08/07/2018) 8 capsule 0   No current facility-administered medications on file prior to visit.     There are no Patient Instructions on file for this visit. Return in about 3 months (around 12/06/2018) for PAD.   Kris Hartmann, NP  This note was completed with Sales executive.  Any errors are purely unintentional.

## 2018-09-17 ENCOUNTER — Other Ambulatory Visit: Payer: Self-pay | Admitting: Family Medicine

## 2018-09-17 DIAGNOSIS — G8929 Other chronic pain: Secondary | ICD-10-CM

## 2018-09-19 NOTE — Telephone Encounter (Signed)
It appears the patient has been getting hydrocodone from his podiatrist.  Please see if he is still taking that.  Thanks.

## 2018-09-20 NOTE — Telephone Encounter (Signed)
Pt called to follow up on refills for tramadol and atorvastatin. Requesting a callback once they have been sent to pharmacy. Please advise.

## 2018-09-20 NOTE — Telephone Encounter (Signed)
Pt called back for refill request. Pt stated he is not taking hydrocodone and he has not taken it for over a week now.

## 2018-09-20 NOTE — Telephone Encounter (Signed)
Please see my prior message regarding the hydrocodone he was getting from his podiatrist.  Please contact him to see if he is still taking that.

## 2018-09-21 ENCOUNTER — Telehealth: Payer: Self-pay

## 2018-09-21 NOTE — Telephone Encounter (Signed)
Patient called stating that he called the pharmacy for a refill on Sunday, and his medication has not been filled. He has been out of his medication for two days. Patient requesting his Atoravastin and Tramadol.

## 2018-09-21 NOTE — Telephone Encounter (Signed)
Sent to pharmacy 

## 2018-09-21 NOTE — Telephone Encounter (Signed)
I called and asked patient about the hydrocodone and he stated he answered that question 2 days ago with someone.  , no he has not taken that in almost 2 weeks.  Kailee Essman,cma

## 2018-10-09 DIAGNOSIS — I96 Gangrene, not elsewhere classified: Secondary | ICD-10-CM | POA: Diagnosis not present

## 2018-10-10 ENCOUNTER — Other Ambulatory Visit (INDEPENDENT_AMBULATORY_CARE_PROVIDER_SITE_OTHER): Payer: Self-pay | Admitting: Nurse Practitioner

## 2018-10-10 DIAGNOSIS — I739 Peripheral vascular disease, unspecified: Secondary | ICD-10-CM

## 2018-10-11 ENCOUNTER — Ambulatory Visit (INDEPENDENT_AMBULATORY_CARE_PROVIDER_SITE_OTHER): Payer: Medicare Other | Admitting: Nurse Practitioner

## 2018-10-11 ENCOUNTER — Encounter (INDEPENDENT_AMBULATORY_CARE_PROVIDER_SITE_OTHER): Payer: Medicare Other

## 2018-10-11 ENCOUNTER — Encounter (INDEPENDENT_AMBULATORY_CARE_PROVIDER_SITE_OTHER): Payer: Self-pay

## 2018-10-16 ENCOUNTER — Other Ambulatory Visit: Payer: Self-pay | Admitting: Family Medicine

## 2018-10-16 DIAGNOSIS — L603 Nail dystrophy: Secondary | ICD-10-CM | POA: Diagnosis not present

## 2018-10-16 DIAGNOSIS — E1140 Type 2 diabetes mellitus with diabetic neuropathy, unspecified: Secondary | ICD-10-CM | POA: Diagnosis not present

## 2018-10-16 DIAGNOSIS — Z89422 Acquired absence of other left toe(s): Secondary | ICD-10-CM | POA: Diagnosis not present

## 2018-10-16 DIAGNOSIS — G8929 Other chronic pain: Secondary | ICD-10-CM

## 2018-10-16 DIAGNOSIS — I96 Gangrene, not elsewhere classified: Secondary | ICD-10-CM | POA: Diagnosis not present

## 2018-10-16 DIAGNOSIS — L97522 Non-pressure chronic ulcer of other part of left foot with fat layer exposed: Secondary | ICD-10-CM | POA: Diagnosis not present

## 2018-10-22 ENCOUNTER — Encounter: Payer: Self-pay | Admitting: Family Medicine

## 2018-10-22 ENCOUNTER — Ambulatory Visit: Payer: Self-pay

## 2018-10-22 ENCOUNTER — Ambulatory Visit (INDEPENDENT_AMBULATORY_CARE_PROVIDER_SITE_OTHER): Payer: Medicare Other | Admitting: Family Medicine

## 2018-10-22 ENCOUNTER — Other Ambulatory Visit: Payer: Self-pay

## 2018-10-22 VITALS — BP 118/64 | HR 80 | Temp 95.9°F | Resp 20 | Ht 76.0 in | Wt 293.6 lb

## 2018-10-22 DIAGNOSIS — Z23 Encounter for immunization: Secondary | ICD-10-CM

## 2018-10-22 DIAGNOSIS — H6591 Unspecified nonsuppurative otitis media, right ear: Secondary | ICD-10-CM

## 2018-10-22 MED ORDER — DOXYCYCLINE HYCLATE 100 MG PO TABS: 100.0000 mg | ORAL_TABLET | Freq: Two times a day (BID) | ORAL | 0 refills | Status: DC

## 2018-10-22 MED ORDER — FLUTICASONE PROPIONATE 50 MCG/ACT NA SUSP: 2.0000 | Freq: Every day | NASAL | 2 refills | Status: DC

## 2018-10-22 NOTE — Progress Notes (Addendum)
Subjective:    Patient ID: Joseph Singh, male    DOB: March 31, 1960, 58 y.o.   MRN: [id number]  HPI   Patient presents to clinic due to bilateral ear pain, fullness and some feelings of dizziness.  States the right ear hurts more than the left.  When he turns head quickly from side to side or stands up from sitting position he can feel like he is unsteady, usually the unsteady/dizzy feeling resolves once he stands still for a moment.  Does not take any sort of allergy medicine or nasal spray regularly.  Patient Active Problem List   Diagnosis Date Noted  . Suspected Covid-19 Virus Infection 07/02/2018  . Hypogonadism in male 02/23/2018  . Elevated ferritin 02/23/2018  . Decreased energy 05/03/2017  . Callus of foot 04/05/2016  . Tobacco use disorder 02/05/2016  . Atherosclerosis of native arteries of extremity with intermittent claudication ([place]) 02/05/2016  . Hyperlipidemia 02/02/2016  . Essential hypertension 10/31/2015  . Peripheral vascular disease ([place]) 10/31/2015  . Diabetic neuropathy ([place]) 10/31/2015  . DM (diabetes mellitus) type II uncontrolled, periph vascular disorder ([place]) 08/18/2014  . Back pain 06/12/2014  . Metabolic syndrome 25/85/2778  . H/O blood clots 06/12/2014    Social History   Tobacco Use  . Smoking status: Former Smoker    Packs/day: 0.50    Years: 35.00    Pack years: 17.50    Types: Cigarettes    Quit date: 08/09/2018    Years since quitting: 0.2  . Smokeless tobacco: Never Used  Substance Use Topics  . Alcohol use: No    Alcohol/week: 0.0 standard drinks   Review of Systems  Constitutional: Negative for chills, fatigue and fever.  HENT: Negative for congestion, sinus pain and sore throat. +ear pain, ear fullness  Eyes: Negative.   Respiratory: Negative for cough, shortness of breath and wheezing.   Cardiovascular: Negative for chest pain, palpitations and leg swelling.  Gastrointestinal: Negative for abdominal pain, diarrhea, nausea  and vomiting.  Genitourinary: Negative for dysuria, frequency and urgency.  Musculoskeletal: Negative for arthralgias and myalgias.  Skin: Negative for color change, pallor and rash.  Neurological: Negative for syncope, light-headedness and headaches. +dizziness at times Psychiatric/Behavioral: The patient is not nervous/anxious.       Objective:   Physical Exam Vitals signs and nursing note reviewed.  Constitutional:      General: He is not in acute distress.    Appearance: He is obese. He is not toxic-appearing.  HENT:     Head: Normocephalic and atraumatic.     Right Ear: Ear canal and external ear normal. A middle ear effusion is present. Tympanic membrane is erythematous and bulging.     Left Ear: Ear canal and external ear normal. A middle ear effusion is present.     Ears:     Comments: Fullness bilat TMs, Right TM red/bulging Hearing for conversational voice normal.  Eyes:     General: No scleral icterus.    Extraocular Movements: Extraocular movements intact.     Pupils: Pupils are equal, round, and reactive to light.  Cardiovascular:     Rate and Rhythm: Normal rate and regular rhythm.  Pulmonary:     Effort: Pulmonary effort is normal. No respiratory distress.     Breath sounds: Normal breath sounds.  Neurological:     General: No focal deficit present.     Mental Status: He is alert and oriented to person, place, and time.  Psychiatric:  Mood and Affect: Mood normal.        Behavior: Behavior normal.    Today's Vitals   10/22/18 1407  BP: 118/64  Pulse: 80  Resp: 20  Temp: (!) 95.9 F (35.5 C)  TempSrc: Temporal  SpO2: 96%  Weight: 293 lb 9.6 oz (133.2 kg)  Height: 6\' 4"  (1.93 m)   Body mass index is 35.74 kg/m.     Assessment & Plan:    Right otitis media - patient's physical exam and symptoms are consistent with a right ear infection with bilateral effusions.  Patient will take antibiotic twice daily for 10 days to clear up ear infection  and he will also use Flonase nasal spray to help keep things open and draining.  Advised he can use Tylenol as needed for any pain, advised to get plenty of rest, keep up good fluid intake and be diligent with good handwashing.  Flu vaccine given in clinic  Patient will keep regular follow-up with PCP as planned and return to clinic sooner if any issues arise.

## 2018-10-22 NOTE — Patient Instructions (Signed)
Otitis Media, Adult  Otitis media means that the middle ear is red and swollen (inflamed) and full of fluid. The condition usually goes away on its own. Follow these instructions at home:  Take over-the-counter and prescription medicines only as told by your doctor.  If you were prescribed an antibiotic medicine, take it as told by your doctor. Do not stop taking the antibiotic even if you start to feel better.  Keep all follow-up visits as told by your doctor. This is important. Contact a doctor if:  You have bleeding from your nose.  There is a lump on your neck.  You are not getting better in 5 days.  You feel worse instead of better. Get help right away if:  You have pain that is not helped with medicine.  You have swelling, redness, or pain around your ear.  You get a stiff neck.  You cannot move part of your face (paralyzed).  You notice that the bone behind your ear hurts when you touch it.  You get a very bad headache. Summary  Otitis media means that the middle ear is red, swollen, and full of fluid.  This condition usually goes away on its own. In some cases, treatment may be needed.  If you were prescribed an antibiotic medicine, take it as told by your doctor. This information is not intended to replace advice given to you by your health care provider. Make sure you discuss any questions you have with your health care provider. Document Released: 07/27/2007 Document Revised: 01/20/2017 Document Reviewed: 02/29/2016 Elsevier Patient Education  2020 [employer] Inc[employer]  

## 2018-10-22 NOTE — Telephone Encounter (Signed)
Pt. Reports he has had dizziness x 2 weeks - " not going away." Weak with standing. Holds on when walking. Feels like he has water in his right ear. Warm transfer to [place] in the practice for a visit.  Answer Assessment - Initial Assessment Questions 1. DESCRIPTION: "Describe your dizziness."     Lightheaded 2. LIGHTHEADED: "Do you feel lightheaded?" (e.g., somewhat faint, woozy, weak upon standing)     Weak when standing 3. VERTIGO: "Do you feel like either you or the room is spinning or tilting?" (i.e. vertigo)     No 4. SEVERITY: "How bad is it?"  "Do you feel like you are going to faint?" "Can you stand and walk?"   - MILD - walking normally   - MODERATE - interferes with normal activities (e.g., work, school)    - SEVERE - unable to stand, requires support to walk, feels like passing out now.      Moderate 5. ONSET:  "When did the dizziness begin?"     2 weeks ago 6. AGGRAVATING FACTORS: "Does anything make it worse?" (e.g., standing, change in head position)     Standing 7. HEART RATE: "Can you tell me your heart rate?" "How many beats in 15 seconds?"  (Note: not all patients can do this)       No 8. CAUSE: "What do you think is causing the dizziness?"     Right ear feels full 9. RECURRENT SYMPTOM: "Have you had dizziness before?" If so, ask: "When was the last time?" "What happened that time?"     No 10. OTHER SYMPTOMS: "Do you have any other symptoms?" (e.g., fever, chest pain, vomiting, diarrhea, bleeding)       Some shortness of breath when he is dizzy 11. PREGNANCY: "Is there any chance you are pregnant?" "When was your last menstrual period?"       N/a  Protocols used: DIZZINESS [facility]

## 2018-10-22 NOTE — Telephone Encounter (Signed)
Patient was scheduled an appt w/ Philis Nettle, NP today @ 2:00 pm.

## 2018-10-22 NOTE — Telephone Encounter (Signed)
Noted. Patient to see FNP.

## 2018-10-25 ENCOUNTER — Inpatient Hospital Stay
Admission: AD | Admit: 2018-10-25 | Discharge: 2018-10-30 | DRG: 240 | Disposition: A | Discharge status: Home Health | Payer: Medicare Other | Source: Ambulatory Visit | Attending: Internal Medicine | Admitting: Internal Medicine

## 2018-10-25 ENCOUNTER — Inpatient Hospital Stay: Payer: Medicare Other

## 2018-10-25 ENCOUNTER — Other Ambulatory Visit: Payer: Self-pay

## 2018-10-25 DIAGNOSIS — E11622 Type 2 diabetes mellitus with other skin ulcer: Secondary | ICD-10-CM | POA: Diagnosis not present

## 2018-10-25 DIAGNOSIS — Z8614 Personal history of Methicillin resistant Staphylococcus aureus infection: Secondary | ICD-10-CM | POA: Diagnosis not present

## 2018-10-25 DIAGNOSIS — L089 Local infection of the skin and subcutaneous tissue, unspecified: Secondary | ICD-10-CM | POA: Diagnosis not present

## 2018-10-25 DIAGNOSIS — L02612 Cutaneous abscess of left foot: Secondary | ICD-10-CM | POA: Diagnosis not present

## 2018-10-25 DIAGNOSIS — H6691 Otitis media, unspecified, right ear: Secondary | ICD-10-CM | POA: Diagnosis present

## 2018-10-25 DIAGNOSIS — Z801 Family history of malignant neoplasm of trachea, bronchus and lung: Secondary | ICD-10-CM

## 2018-10-25 DIAGNOSIS — B9562 Methicillin resistant Staphylococcus aureus infection as the cause of diseases classified elsewhere: Secondary | ICD-10-CM

## 2018-10-25 DIAGNOSIS — E11628 Type 2 diabetes mellitus with other skin complications: Secondary | ICD-10-CM | POA: Diagnosis present

## 2018-10-25 DIAGNOSIS — E1151 Type 2 diabetes mellitus with diabetic peripheral angiopathy without gangrene: Secondary | ICD-10-CM | POA: Diagnosis not present

## 2018-10-25 DIAGNOSIS — Z89422 Acquired absence of other left toe(s): Secondary | ICD-10-CM

## 2018-10-25 DIAGNOSIS — E1169 Type 2 diabetes mellitus with other specified complication: Secondary | ICD-10-CM | POA: Diagnosis present

## 2018-10-25 DIAGNOSIS — M869 Osteomyelitis, unspecified: Secondary | ICD-10-CM | POA: Diagnosis present

## 2018-10-25 DIAGNOSIS — I10 Essential (primary) hypertension: Secondary | ICD-10-CM | POA: Diagnosis not present

## 2018-10-25 DIAGNOSIS — L97529 Non-pressure chronic ulcer of other part of left foot with unspecified severity: Secondary | ICD-10-CM | POA: Diagnosis not present

## 2018-10-25 DIAGNOSIS — Z8249 Family history of ischemic heart disease and other diseases of the circulatory system: Secondary | ICD-10-CM

## 2018-10-25 DIAGNOSIS — Z20828 Contact with and (suspected) exposure to other viral communicable diseases: Secondary | ICD-10-CM | POA: Diagnosis present

## 2018-10-25 DIAGNOSIS — Z833 Family history of diabetes mellitus: Secondary | ICD-10-CM | POA: Diagnosis not present

## 2018-10-25 DIAGNOSIS — Z7902 Long term (current) use of antithrombotics/antiplatelets: Secondary | ICD-10-CM | POA: Diagnosis not present

## 2018-10-25 DIAGNOSIS — Z79899 Other long term (current) drug therapy: Secondary | ICD-10-CM

## 2018-10-25 DIAGNOSIS — Z7982 Long term (current) use of aspirin: Secondary | ICD-10-CM | POA: Diagnosis not present

## 2018-10-25 DIAGNOSIS — M86172 Other acute osteomyelitis, left ankle and foot: Secondary | ICD-10-CM | POA: Diagnosis not present

## 2018-10-25 DIAGNOSIS — Z803 Family history of malignant neoplasm of breast: Secondary | ICD-10-CM | POA: Diagnosis not present

## 2018-10-25 DIAGNOSIS — Z794 Long term (current) use of insulin: Secondary | ICD-10-CM

## 2018-10-25 DIAGNOSIS — B957 Other staphylococcus as the cause of diseases classified elsewhere: Secondary | ICD-10-CM | POA: Diagnosis present

## 2018-10-25 DIAGNOSIS — E1152 Type 2 diabetes mellitus with diabetic peripheral angiopathy with gangrene: Secondary | ICD-10-CM | POA: Diagnosis not present

## 2018-10-25 DIAGNOSIS — E1142 Type 2 diabetes mellitus with diabetic polyneuropathy: Secondary | ICD-10-CM | POA: Diagnosis not present

## 2018-10-25 DIAGNOSIS — E785 Hyperlipidemia, unspecified: Secondary | ICD-10-CM | POA: Diagnosis present

## 2018-10-25 DIAGNOSIS — F17211 Nicotine dependence, cigarettes, in remission: Secondary | ICD-10-CM | POA: Diagnosis not present

## 2018-10-25 DIAGNOSIS — E119 Type 2 diabetes mellitus without complications: Secondary | ICD-10-CM | POA: Diagnosis not present

## 2018-10-25 DIAGNOSIS — Z87891 Personal history of nicotine dependence: Secondary | ICD-10-CM | POA: Diagnosis not present

## 2018-10-25 DIAGNOSIS — Z9862 Peripheral vascular angioplasty status: Secondary | ICD-10-CM | POA: Diagnosis not present

## 2018-10-25 DIAGNOSIS — L03116 Cellulitis of left lower limb: Secondary | ICD-10-CM | POA: Diagnosis not present

## 2018-10-25 DIAGNOSIS — M1990 Unspecified osteoarthritis, unspecified site: Secondary | ICD-10-CM | POA: Diagnosis present

## 2018-10-25 DIAGNOSIS — A4902 Methicillin resistant Staphylococcus aureus infection, unspecified site: Secondary | ICD-10-CM | POA: Diagnosis not present

## 2018-10-25 DIAGNOSIS — I96 Gangrene, not elsewhere classified: Secondary | ICD-10-CM | POA: Diagnosis not present

## 2018-10-25 LAB — COMPREHENSIVE METABOLIC PANEL
ALT: 17 U/L (ref 0–44)
AST: 13 U/L — ABNORMAL LOW (ref 15–41)
Albumin: 3.3 g/dL — ABNORMAL LOW (ref 3.5–5.0)
Alkaline Phosphatase: 110 U/L (ref 38–126)
Anion gap: 10 (ref 5–15)
BUN: 31 mg/dL — ABNORMAL HIGH (ref 6–20)
CO2: 26 mmol/L (ref 22–32)
Calcium: 9.2 mg/dL (ref 8.9–10.3)
Chloride: 99 mmol/L (ref 98–111)
Creatinine, Ser: 1.09 mg/dL (ref 0.61–1.24)
GFR calc Af Amer: >60 mL/min (ref >60)
GFR calc non Af Amer: >60 mL/min (ref >60)
Glucose, Bld: 154 mg/dL — ABNORMAL HIGH (ref 70–99)
Potassium: 4.5 mmol/L (ref 3.5–5.1)
Sodium: 135 mmol/L (ref 135–145)
Total Bilirubin: 0.4 mg/dL (ref 0.3–1.2)
Total Protein: 7.7 g/dL (ref 6.5–8.1)

## 2018-10-25 LAB — CBC WITH DIFFERENTIAL/PLATELET
Abs Immature Granulocytes: 0.05 10*3/uL (ref 0.00–0.07)
Basophils Absolute: 0.1 10*3/uL (ref 0.0–0.1)
Basophils Relative: 1 %
Eosinophils Absolute: 0.1 10*3/uL (ref 0.0–0.5)
Eosinophils Relative: 1 %
HCT: 38.2 % — ABNORMAL LOW (ref 39.0–52.0)
Hemoglobin: 12.4 g/dL — ABNORMAL LOW (ref 13.0–17.0)
Immature Granulocytes: 1 %
Lymphocytes Relative: 25 %
Lymphs Abs: 2 10*3/uL (ref 0.7–4.0)
MCH: 29.1 pg (ref 26.0–34.0)
MCHC: 32.5 g/dL (ref 30.0–36.0)
MCV: 89.7 fL (ref 80.0–100.0)
Monocytes Absolute: 0.8 10*3/uL (ref 0.1–1.0)
Monocytes Relative: 9 %
Neutro Abs: 5.2 10*3/uL (ref 1.7–7.7)
Neutrophils Relative %: 63 %
Platelets: 404 10*3/uL — ABNORMAL HIGH (ref 150–400)
RBC: 4.26 MIL/uL (ref 4.22–5.81)
RDW: 13.1 % (ref 11.5–15.5)
WBC: 8.1 10*3/uL (ref 4.0–10.5)
nRBC: 0 % (ref 0.0–0.2)

## 2018-10-25 LAB — HEMOGLOBIN A1C
Hgb A1c MFr Bld: 8.1 % — ABNORMAL HIGH (ref 4.8–5.6)
Mean Plasma Glucose: 185.77 mg/dL

## 2018-10-25 LAB — GLUCOSE, CAPILLARY
Glucose-Capillary: 188 mg/dL — ABNORMAL HIGH (ref 70–99)
Glucose-Capillary: 125 mg/dL — ABNORMAL HIGH (ref 70–99)
Glucose-Capillary: 134 mg/dL — ABNORMAL HIGH (ref 70–99)
Glucose-Capillary: 58 mg/dL — ABNORMAL LOW (ref 70–99)

## 2018-10-25 LAB — MAGNESIUM: Magnesium: 1.8 mg/dL (ref 1.7–2.4)

## 2018-10-25 LAB — SARS CORONAVIRUS 2 BY RT PCR (HOSPITAL ORDER, PERFORMED IN ~~LOC~~ HOSPITAL LAB): SARS Coronavirus 2: NEGATIVE

## 2018-10-25 MED ORDER — CHLORHEXIDINE GLUCONATE CLOTH 2 % EX PADS
6.0000 | MEDICATED_PAD | Freq: Every day | CUTANEOUS | Status: DC
  Administered 2018-10-26: 06:00:00 6 via TOPICAL

## 2018-10-25 MED ORDER — LISINOPRIL-HYDROCHLOROTHIAZIDE 20-25 MG PO TABS: 0.5000 | ORAL_TABLET | Freq: Every day | ORAL | Status: DC

## 2018-10-25 MED ORDER — MUPIROCIN 2 % EX OINT
TOPICAL_OINTMENT | Freq: Every day | CUTANEOUS | Status: DC
  Filled 2018-10-25: qty 22

## 2018-10-25 MED ORDER — TRAMADOL HCL 50 MG PO TABS
50.0000 mg | ORAL_TABLET | Freq: Three times a day (TID) | ORAL | Status: DC
  Administered 2018-10-25 – 2018-10-30 (×14): 50 mg via ORAL
  Filled 2018-10-25 (×14): qty 1

## 2018-10-25 MED ORDER — FLUTICASONE PROPIONATE 50 MCG/ACT NA SUSP
2.0000 | Freq: Every day | NASAL | Status: DC
  Filled 2018-10-25: qty 16

## 2018-10-25 MED ORDER — MUPIROCIN 2 % EX OINT
1.0000 "application " | TOPICAL_OINTMENT | Freq: Two times a day (BID) | CUTANEOUS | Status: DC
  Filled 2018-10-25: qty 22

## 2018-10-25 MED ORDER — INSULIN ASPART 100 UNIT/ML ~~LOC~~ SOLN: 0.0000 [IU] | Freq: Every day | SUBCUTANEOUS | Status: DC

## 2018-10-25 MED ORDER — ATORVASTATIN CALCIUM 20 MG PO TABS
40.0000 mg | ORAL_TABLET | Freq: Every day | ORAL | Status: DC
  Administered 2018-10-25 – 2018-10-30 (×6): 40 mg via ORAL
  Filled 2018-10-25 (×6): qty 2

## 2018-10-25 MED ORDER — PIPERACILLIN-TAZOBACTAM 3.375 G IVPB
3.3750 g | Freq: Three times a day (TID) | INTRAVENOUS | Status: DC
  Administered 2018-10-25 – 2018-10-28 (×8): 3.375 g via INTRAVENOUS
  Filled 2018-10-25 (×8): qty 50

## 2018-10-25 MED ORDER — AMLODIPINE BESYLATE 10 MG PO TABS: 10.0000 mg | ORAL_TABLET | ORAL | Status: DC

## 2018-10-25 MED ORDER — METOPROLOL SUCCINATE ER 50 MG PO TB24
50.0000 mg | ORAL_TABLET | ORAL | Status: DC
  Administered 2018-10-26: 50 mg via ORAL
  Filled 2018-10-25: qty 1

## 2018-10-25 MED ORDER — HYDROCHLOROTHIAZIDE 12.5 MG PO CAPS
12.5000 mg | ORAL_CAPSULE | Freq: Every day | ORAL | Status: DC
  Administered 2018-10-27: 12.5 mg via ORAL
  Filled 2018-10-25: qty 1

## 2018-10-25 MED ORDER — ENOXAPARIN SODIUM 40 MG/0.4ML ~~LOC~~ SOLN
40.0000 mg | Freq: Every day | SUBCUTANEOUS | Status: DC
  Administered 2018-10-25: 40 mg via SUBCUTANEOUS
  Filled 2018-10-25 (×3): qty 0.4

## 2018-10-25 MED ORDER — VANCOMYCIN HCL IN DEXTROSE 1-5 GM/200ML-% IV SOLN: 1000.0000 mg | Freq: Once | INTRAVENOUS | Status: DC

## 2018-10-25 MED ORDER — ASPIRIN EC 81 MG PO TBEC
81.0000 mg | DELAYED_RELEASE_TABLET | Freq: Every day | ORAL | Status: DC
  Administered 2018-10-26 – 2018-10-30 (×5): 81 mg via ORAL
  Filled 2018-10-25 (×5): qty 1

## 2018-10-25 MED ORDER — GABAPENTIN 300 MG PO CAPS
300.0000 mg | ORAL_CAPSULE | Freq: Four times a day (QID) | ORAL | Status: DC
  Administered 2018-10-25 – 2018-10-30 (×20): 300 mg via ORAL
  Filled 2018-10-25 (×20): qty 1

## 2018-10-25 MED ORDER — VANCOMYCIN HCL 10 G IV SOLR
2000.0000 mg | Freq: Once | INTRAVENOUS | Status: AC
  Administered 2018-10-25: 17:00:00 2000 mg via INTRAVENOUS
  Filled 2018-10-25: qty 2000

## 2018-10-25 MED ORDER — CLOPIDOGREL BISULFATE 75 MG PO TABS
75.0000 mg | ORAL_TABLET | Freq: Every day | ORAL | Status: DC
  Administered 2018-10-26 – 2018-10-30 (×5): 75 mg via ORAL
  Filled 2018-10-25 (×5): qty 1

## 2018-10-25 MED ORDER — DOXYCYCLINE HYCLATE 100 MG PO TABS
100.0000 mg | ORAL_TABLET | Freq: Two times a day (BID) | ORAL | Status: DC
  Administered 2018-10-25: 23:00:00 100 mg via ORAL
  Filled 2018-10-25: qty 1

## 2018-10-25 MED ORDER — INSULIN ASPART 100 UNIT/ML ~~LOC~~ SOLN
0.0000 [IU] | Freq: Three times a day (TID) | SUBCUTANEOUS | Status: DC
  Administered 2018-10-25: 17:00:00 2 [IU] via SUBCUTANEOUS
  Administered 2018-10-26: 3 [IU] via SUBCUTANEOUS
  Administered 2018-10-27: 01:00:00 1 [IU] via SUBCUTANEOUS
  Administered 2018-10-27 (×2): 2 [IU] via SUBCUTANEOUS
  Administered 2018-10-28: 17:00:00 1 [IU] via SUBCUTANEOUS
  Filled 2018-10-25 (×7): qty 1

## 2018-10-25 MED ORDER — DOXYCYCLINE HYCLATE 100 MG PO TABS: 100.0000 mg | ORAL_TABLET | Freq: Two times a day (BID) | ORAL | Status: DC

## 2018-10-25 MED ORDER — VANCOMYCIN HCL 10 G IV SOLR
1250.0000 mg | Freq: Two times a day (BID) | INTRAVENOUS | Status: DC
  Administered 2018-10-26: 06:00:00 1250 mg via INTRAVENOUS
  Filled 2018-10-25 (×3): qty 1250

## 2018-10-25 MED ORDER — LISINOPRIL 10 MG PO TABS
10.0000 mg | ORAL_TABLET | Freq: Every day | ORAL | Status: DC
  Administered 2018-10-29 – 2018-10-30 (×2): 10 mg via ORAL
  Filled 2018-10-25 (×2): qty 1

## 2018-10-25 NOTE — H&P (Addendum)
 [facility] [place] [facility]   PATIENT NAME: Joseph Singh    MR#:  [id number]  DATE OF BIRTH:  07/13/1960  DATE OF ADMISSION:  10/25/2018  PRIMARY CARE PHYSICIAN: Sonnenberg, [name], MD   REQUESTING/REFERRING PHYSICIAN: Fowler, Justin, D.P.M.  CHIEF COMPLAINT:  No chief complaint on file.   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  Patient was seen today by his podiatrist Dr. Fowler for worsening left foot infection.  He underwent surgical debridement of necrotic bone and soft tissue on his left fifth metatarsal by Dr. Fowler in 07/2018.  Patient states wound is been healing well without pain until recently when he developed an area of drainage at the base of the fifth metatarsal laterally along the left foot.  He had an x-ray done as an outpatient today which showed obvious erosive change consistent with osteomyelitis to the lateral left foot.  Given this finding, hospitalist was contacted to admit for antibiotics and plan for surgical debridement of the foot.  PAST MEDICAL HISTORY:   Past Medical History:  Diagnosis Date  . Arthritis   . Diabetes mellitus without complication [name]   . History of kidney stones    h/o  . Hyperlipidemia   . Hypertension   . Neuropathy     PAST SURGICAL HISTORY:   Past Surgical History:  Procedure Laterality Date  . AMPUTATION TOE Left 08/10/2018   Procedure: RAY LEFT;  Surgeon: Fowler, Justin, DPM;  Location: [facility] ORS;  Service: Podiatry;  Laterality: Left;  . LOWER EXTREMITY ANGIOGRAPHY Right 02/25/2016   Procedure: Lower Extremity Angiography;  Surgeon:  Jason S Dew, MD;  Location: [facility];  Service: Cardiovascular;  Laterality: Right;  . LOWER EXTREMITY ANGIOGRAPHY Right 02/27/2017   Procedure: LOWER EXTREMITY ANGIOGRAPHY;  Surgeon: Dew, Jason S, MD;  Location: [facility];  Service: Cardiovascular;  Laterality: Right;  . LOWER EXTREMITY ANGIOGRAPHY Left 08/06/2018   Procedure: LOWER EXTREMITY ANGIOGRAPHY;  Surgeon: Dew, Jason S, MD;  Location: [facility];  Service: Cardiovascular;  Laterality: Left;  . LOWER EXTREMITY INTERVENTION  02/25/2016   Procedure: Lower Extremity Intervention;  Surgeon: Jason S Dew, MD;  Location: [facility];  Service: Cardiovascular;;  . TOE AMPUTATION      SOCIAL HISTORY:   Social History   Tobacco Use  . Smoking status: Former Smoker    Packs/day: 0.50    Years: 35.00    Pack years: 17.50    Types: Cigarettes    Quit date: 08/09/2018    Years since quitting: 0.2  . Smokeless tobacco: Never Used  Substance Use Topics  . Alcohol use: No    Alcohol/week: 0.0 standard drinks    FAMILY HISTORY:   Family History  Problem Relation Age of Onset  . Diabetes Mother   . Heart disease Father   . Diabetes Sister   . Diabetes Brother   . Cancer Sister        lung  . Cancer Sister        breast    DRUG ALLERGIES:  No Known Allergies  REVIEW OF SYSTEMS:   Review of Systems  Constitutional: Negative for chills, fever, malaise/fatigue   and weight loss.  HENT: Negative for congestion, hearing loss and sore throat.   Eyes: Negative for blurred vision and double vision.  Respiratory: Negative for cough, shortness of breath and wheezing.   Cardiovascular: Negative for chest pain, palpitations, orthopnea and leg swelling.  Gastrointestinal: Negative for abdominal pain, diarrhea, nausea and vomiting.  Genitourinary: Negative for dysuria and urgency.  Musculoskeletal: Positive for joint pain. Negative for myalgias.  Skin: Negative for rash.  Neurological: Positive for  sensory change. Negative for dizziness, speech change, focal weakness and headaches.  Psychiatric/Behavioral: Negative for depression.   MEDICATIONS AT HOME:   Prior to Admission medications   Medication Sig Start Date End Date Taking? Authorizing Provider  amLODipine (NORVASC) 10 MG tablet TAKE ONE (1) TABLET EACH DAY Patient taking differently: Take 10 mg by mouth every morning.  11/22/17   Leone Haven, MD  aspirin EC 81 MG tablet Take 1 tablet (81 mg total) by mouth daily. 02/28/17   Algernon Huxley, MD  atorvastatin (LIPITOR) 40 MG tablet TAKE ONE (1) TABLET EACH DAY 09/21/18   Leone Haven, MD  blood glucose meter kit and supplies KIT E11.51, check 2x daily, dispense based on patient and insurance preference 05/03/17   Leone Haven, MD  cephALEXin (KEFLEX) 500 MG capsule Take 1 capsule (500 mg total) by mouth 4 (four) times daily. 06/14/18   Jodelle Green, FNP  clopidogrel (PLAVIX) 75 MG tablet TAKE ONE (1) TABLET EACH DAY 08/22/18   Leone Haven, MD  cyclobenzaprine (FLEXERIL) 10 MG tablet TAKE ONE TABLET BY MOUTH 3 TIMES DAILY AS NEEDED FOR MUSCLE SPASMS. Patient taking differently: Take 10 mg by mouth 3 (three) times daily as needed for muscle spasms.  10/04/17   Leone Haven, MD  doxycycline (VIBRA-TABS) 100 MG tablet Take 1 tablet (100 mg total) by mouth 2 (two) times daily. 10/22/18   Jodelle Green, FNP  empagliflozin (JARDIANCE) 25 MG TABS tablet Take 25 mg by mouth daily. 01/29/18   Leone Haven, MD  fluticasone (FLONASE) 50 MCG/ACT nasal spray Place 2 sprays into both nostrils daily. 10/22/18   Jodelle Green, FNP  gabapentin (NEURONTIN) 300 MG capsule Take 1 capsule (300 mg total) by mouth 4 (four) times daily. 04/26/18   Leone Haven, MD  glipiZIDE (GLUCOTROL XL) 10 MG 24 hr tablet TAKE ONE (1) TABLET EACH DAY WITH BREAKFAST 08/22/18   Leone Haven, MD  glucose blood (ONE TOUCH ULTRA TEST) test strip Check blood sugar twice daily Dx code: E11.9  11/07/17   Leone Haven, MD  HYDROcodone-acetaminophen (NORCO) 5-325 MG tablet Take 1 tablet by mouth every 6 (six) hours as needed for moderate pain. 08/10/18   Samara Deist, DPM  lisinopril-hydrochlorothiazide (PRINZIDE,ZESTORETIC) 20-25 MG tablet TAKE 1 TABLET BY MOUTH EVERY DAY Patient taking differently: Take 0.5 tablets by mouth daily.  06/05/18   Leone Haven, MD  metFORMIN (GLUCOPHAGE) 1000 MG tablet TAKE 1 TABLET BY MOUTH TWICE DAILY WITH A MEAL Patient taking differently: Take 1,000 mg by mouth 2 (two) times daily with a meal.  03/16/17   Leone Haven, MD  metoprolol succinate (TOPROL-XL) 50 MG 24 hr tablet Take 1 tablet (50 mg total) by mouth daily. Patient taking differently: Take 50 mg by mouth every morning.  04/10/18   Leone Haven, MD  mupirocin ointment (BACTROBAN) 2 % Place thin layer of ointment over red area of skin on left leg 2 times per day for 7  days. 06/20/18   Guse, Lauren M, FNP  sulfamethoxazole-trimethoprim (BACTRIM DS) 800-160 MG tablet Take 1 tablet by mouth 2 (two) times daily. for 10 days 07/10/18   [provider]  TRADJENTA 5 MG TABS tablet TAKE 1 TABLET BY MOUTH EVERY DAY 08/13/18   Sonnenberg, [name], MD  traMADol (ULTRAM) 50 MG tablet TAKE 1 TABLET BY MOUTH THREE TIMES A DAY 10/21/18   Sonnenberg, [name], MD  Vitamin D, Ergocalciferol, (DRISDOL) 50000 units CAPS capsule Take 1 capsule (50,000 Units total) by mouth every 7 (seven) days. 05/08/17   Sonnenberg, [name], MD      VITAL SIGNS:  Blood pressure 115/66, pulse 72, temperature 98.3 F (36.8 C), temperature source Oral, resp. rate 19, SpO2 100 %.  PHYSICAL EXAMINATION:   Physical Exam  GENERAL:  57 y.o.-year-old patient lying in the bed with no acute distress.  EYES: Pupils equal, round, reactive to light and accommodation. No scleral icterus. Extraocular muscles intact.  HEENT: Head atraumatic, normocephalic. Oropharynx and nasopharynx clear.  NECK:  Supple, no jugular  venous distention. No thyroid enlargement, no tenderness.  LUNGS: Normal breath sounds bilaterally, no wheezing, rales,rhonchi or crepitation. No use of accessory muscles of respiration.  CARDIOVASCULAR: S1, S2 normal. No murmurs, rubs, or gallops.  ABDOMEN: Soft, nontender, nondistended. Bowel sounds present. No organomegaly or mass.  EXTREMITIES: No pedal edema, cyanosis, or clubbing. No rash or lesions. + pedal pulses MUSCULOSKELETAL: Normal bulk, and power was 5+ grip and elbow, knee, and ankle flexion and extension bilaterally.  NEUROLOGIC:Alert and oriented x 3. CN 2-12 intact. Sensation to light touch and cold stimuli decreased bilaterally. Gait not tested due to safety concern. PSYCHIATRIC: The patient is alert and oriented x 3.  SKIN: see below     DATA REVIEWED:  LABORATORY PANEL:   CBC No results for input(s): WBC, HGB, HCT, PLT in the last 168 hours. ------------------------------------------------------------------------------------------------------------------  Chemistries  No results for input(s): NA, K, CL, CO2, GLUCOSE, BUN, CREATININE, CALCIUM, MG, AST, ALT, ALKPHOS, BILITOT in the last 168 hours.  Invalid input(s): GFRCGP ------------------------------------------------------------------------------------------------------------------  Cardiac Enzymes No results for input(s): TROPONINI in the last 168 hours. ------------------------------------------------------------------------------------------------------------------  RADIOLOGY:  No results found.  EKG:  EKG: there are no previous tracings available for comparison.  IMPRESSION AND PLAN:   57 y.o. 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  1. Left foot osteomyelitis -x-ray shows obvious erosive change consistent with osteomyelitis to the lateral left foot - Admit to MedSurg unit - Check CBC, CMP - Obtain blood cultures - Obtain MRI left foot to evaluate the extent of the infection - Start empiric antibiotics with vancomycin - Podiatry consult placed to Dr. Fowler  2. Right ear infection -We will continue doxycycline as he reports improvement  3. Diabetes Mellitus Type 2 with complications - Recent HgbA1c 7.6. Goal < 7.0 - Check hemoglobin A1c - Hold metformin, Jardiance,Tradjenta, and glipizide for now - CBG monitoring - SSI - DM education and close PCP follow up  4. HTN  + Goal BP <130/80 - Continue amlodipine, lisinopril and metoprolol.  5. HLD  + Goal LDL<100 - Atorvastatin 40mg PO qhs  6. PAD with recent revascularization per vascular - Continue aspirin and Plavix  7. DVT prophylaxis -   Enoxaparin SubQ   All the records are reviewed and case discussed with ED provider. Management plans discussed with the patient, family and they are in agreement.  CODE STATUS: FULL  TOTAL TIME TAKING CARE OF THIS PATIENT: 50 minutes.    on 10/25/2018 at 12:51 PM  Elizabeth Ouma, DNP, FNP-BC [facility] Nurse Practitioner Between 7am to 6pm - Pager - [phone]  After 6pm go to [email or web address] - password [email or web address]  [facility]  Office  [phone]  CC: Primary care physician; Sonnenberg, [name], MD    

## 2018-10-25 NOTE — Consult Note (Signed)
NAME: Joseph Singh  DOB: 06-Dec-1960  MRN: [id number]  Date/Time: 10/25/2018 11:30 PM  REQUESTING PROVIDER: Dr. [name] Subjective:  REASON FOR CONSULT: Osteomyelitis of the left foot. ? Joseph Singh is a 58 y.o. male with a history of diabetes mellitus, hypertension, hyperlipidemia, peripheral neuropathy, peripheral vascular disease, recent fifth ray amputation on the left foot is admitted to the hospital with with residual infection on the left foot and worsening x-ray changes worsening x-ray changes suggestive concerning for osteomyelitis.  Patient underwent on 08/10/2018 Partial left fifth ray amputation for diabetic ulcer with necrosis.  The resected margin was clear of osteomyelitis.  This had been present  for a few weeks before that.  Patient also had angiogram and  underwent angioplasty of the left popliteal artery and proximal tibioperoneal trunk and the left peroneal artery on 08/06/2018.  On 07/25/2018 the culture was positive for MRSA. Patient was treated with Bactrim.  After the surgery the foot was healing okay.  And then he developed a little discharge and when he saw Dr. [name] as outpatient an x-ray was done and it was noted to show more erosive changes.  He was admitted to the hospital for further surgery. Patient does not have any fever or chills.  He has minimal discharge from the left foot. No history of trauma. He has diabetes which he states is fairly controlled. He has  quit smoking a few months ago Patient recently saw his PCP for some dizziness and was diagnosed with right ear infection and prescribed doxycycline. Past Medical History:  Diagnosis Date  . Arthritis   . Diabetes mellitus without complication [name]   . History of kidney stones    h/o  . Hyperlipidemia   . Hypertension   . Neuropathy     Past Surgical History:  Procedure Laterality Date  . AMPUTATION TOE Left 08/10/2018   Procedure: RAY LEFT;  Surgeon: Gwyneth RevelsFowler, Justin, DPM;  Location: [facility];   Service: Podiatry;  Laterality: Left;  . LOWER EXTREMITY ANGIOGRAPHY Right 02/25/2016   Procedure: Lower Extremity Angiography;  Surgeon: Annice NeedyJason S Dew, MD;  Location: [facility];  Service: Cardiovascular;  Laterality: Right;  . LOWER EXTREMITY ANGIOGRAPHY Right 02/27/2017   Procedure: LOWER EXTREMITY ANGIOGRAPHY;  Surgeon: Annice Needyew, Jason S, MD;  Location: [facility];  Service: Cardiovascular;  Laterality: Right;  . LOWER EXTREMITY ANGIOGRAPHY Left 08/06/2018   Procedure: LOWER EXTREMITY ANGIOGRAPHY;  Surgeon: Annice Needyew, Jason S, MD;  Location: [facility];  Service: Cardiovascular;  Laterality: Left;  . LOWER EXTREMITY INTERVENTION  02/25/2016   Procedure: Lower Extremity Intervention;  Surgeon: Annice NeedyJason S Dew, MD;  Location: [facility];  Service: Cardiovascular;;  . TOE AMPUTATION      Social History   Socioeconomic History  . Marital status: Married    Spouse name: Not on file  . Number of children: Not on file  . Years of education: Not on file  . Highest education level: Not on file  Occupational History  . Not on file  Social Needs  . Financial resource strain: Not hard at all  . Food insecurity    Worry: Never true    Inability: Never true  . Transportation needs    Medical: No    Non-medical: No  Tobacco Use  . Smoking status: Former Smoker    Packs/day: 0.50    Years: 35.00    Pack years: 17.50    Types: Cigarettes    Quit date: 08/09/2018  Years since quitting: 0.2  . Smokeless tobacco: Never Used  Substance and Sexual Activity  . Alcohol use: No    Alcohol/week: 0.0 standard drinks  . Drug use: No  . Sexual activity: Yes    Partners: Female    Comment: Wife  Lifestyle  . Physical activity    Days per week: 3 days    Minutes per session: 20 min  . Stress: Not at all  Relationships  . Social Herbalist on phone: Not on file    Gets together: Not on file    Attends religious service: Not on file    Active member of club or  organization: Not on file    Attends meetings of clubs or organizations: Not on file    Relationship status: Married  . Intimate partner violence    Fear of current or ex partner: No    Emotionally abused: No    Physically abused: No    Forced sexual activity: No  Other Topics Concern  . Not on file  Social History Narrative   Permanently disabled   [employer] on the job and hurt his shoulder    Lives with wife and 2 children    1 grandchild from a previous marriage    Pets: Not inside    GED    Right handed    Caffeine- 2-3 sodas, tea, no coffee    Enjoys watching tv and being outside     Family History  Problem Relation Age of Onset  . Diabetes Mother   . Heart disease Father   . Diabetes Sister   . Diabetes Brother   . Cancer Sister        lung  . Cancer Sister        breast   No Known Allergies  ? Current Facility-Administered Medications  Medication Dose Route Frequency Provider Last Rate Last Dose  . [START ON 10/26/2018] amLODipine (NORVASC) tablet 10 mg  10 mg Oral BH-q7a Lang Snow, NP      . aspirin EC tablet 81 mg  81 mg Oral Daily Ouma, Bing Neighbors, NP      . atorvastatin (LIPITOR) tablet 40 mg  40 mg Oral q1800 Lang Snow, NP   40 mg at 10/25/18 1704  . [START ON 10/26/2018] Chlorhexidine Gluconate Cloth 2 % PADS 6 each  6 each Topical Q0600 Samara Deist, DPM      . clopidogrel (PLAVIX) tablet 75 mg  75 mg Oral Daily Ouma, Bing Neighbors, NP      . doxycycline (VIBRA-TABS) tablet 100 mg  100 mg Oral Q12H Lang Snow, NP   100 mg at 10/25/18 2233  . enoxaparin (LOVENOX) injection 40 mg  40 mg Subcutaneous QHS Lang Snow, NP   40 mg at 10/25/18 1701  . fluticasone (FLONASE) 50 MCG/ACT nasal spray 2 spray  2 spray Each Nare Daily Ouma, Bing Neighbors, NP      . gabapentin (NEURONTIN) capsule 300 mg  300 mg Oral QID Lang Snow, NP   300 mg at 10/25/18 2233  . hydrochlorothiazide (MICROZIDE)  capsule 12.5 mg  12.5 mg Oral Daily Charlett Nose, RPH      . insulin aspart (novoLOG) injection 0-5 Units  0-5 Units Subcutaneous QHS Ouma, Bing Neighbors, NP      . insulin aspart (novoLOG) injection 0-9 Units  0-9 Units Subcutaneous TID WC Lang Snow, NP   2 Units at 10/25/18 1701  .  lisinopril (ZESTRIL) tablet 10 mg  10 mg Oral Daily Bertram Savin, RPH      . [START ON 10/26/2018] metoprolol succinate (TOPROL-XL) 24 hr tablet 50 mg  50 mg Oral BH-q7a Ouma, Hubbard Hartshorn, NP      . mupirocin ointment (BACTROBAN) 2 % 1 application  1 application Nasal BID Gwyneth Revels, DPM      . piperacillin-tazobactam (ZOSYN) IVPB 3.375 g  3.375 g Intravenous Q8H Dorothea Ogle B, RPH 12.5 mL/hr at 10/25/18 2235 3.375 g at 10/25/18 2235  . traMADol (ULTRAM) tablet 50 mg  50 mg Oral TID Jimmye Norman, NP   50 mg at 10/25/18 2233  . [START ON 10/26/2018] vancomycin (VANCOCIN) 1,250 mg in sodium chloride 0.9 % 250 mL IVPB  1,250 mg Intravenous Q12H Tressie Ellis, RPH         Abtx:  Anti-infectives (From admission, onward)   Start     Dose/Rate Route Frequency Ordered Stop   10/26/18 0600  vancomycin (VANCOCIN) 1,250 mg in sodium chloride 0.9 % 250 mL IVPB     1,250 mg 166.7 mL/hr over 90 Minutes Intravenous Every 12 hours 10/25/18 1524     10/25/18 1600  piperacillin-tazobactam (ZOSYN) IVPB 3.375 g     3.375 g 12.5 mL/hr over 240 Minutes Intravenous Every 8 hours 10/25/18 1520     10/25/18 1245  doxycycline (VIBRA-TABS) tablet 100 mg     100 mg Oral Every 12 hours 10/25/18 1232     10/25/18 1230  vancomycin (VANCOCIN) 2,000 mg in sodium chloride 0.9 % 500 mL IVPB     2,000 mg 250 mL/hr over 120 Minutes Intravenous  Once 10/25/18 1225 10/25/18 1855   10/25/18 1215  vancomycin (VANCOCIN) IVPB 1000 mg/200 mL premix  Status:  Discontinued     1,000 mg 200 mL/hr over 60 Minutes Intravenous  Once 10/25/18 1210 10/25/18 1225   10/25/18 1215  doxycycline (VIBRA-TABS)  tablet 100 mg  Status:  Discontinued     100 mg Oral Every 12 hours 10/25/18 1210 10/25/18 1232      REVIEW OF SYSTEMS:  Const: negative fever, negative chills, used to weigh 340 pounds now he is 293 pounds.  Intentional weight loss few years ago. Eyes: negative diplopia or visual changes, negative eye pain ENT: negative coryza, negative sore throat Resp: negative cough, hemoptysis, dyspnea Cards: negative for chest pain, palpitations, lower extremity edema GU: negative for frequency, dysuria and hematuria GI: Negative for abdominal pain, diarrhea, bleeding, constipation Skin: negative for rash and pruritus Heme: negative for easy bruising and gum/nose bleeding MS: negative for myalgias, arthralgias, back pain and muscle weakness Neurolo:negative for headaches, dizziness, vertigo, memory problems  Psych: negative for feelings of anxiety, depression  Endocrine: Says diabetes is fairly well controlled allergy/Immunology- negative for any medication or food allergies ?  Objective:  VITALS:  BP 105/61 (BP Location: Left Arm)   Pulse 66   Temp 98.3 F (36.8 C) (Oral)   Resp 19   Ht 6' 3.98" (1.93 m)   Wt 133.2 kg   SpO2 100%   BMI 35.75 kg/m  PHYSICAL EXAM:  General: Alert, cooperative, no distress, appears stated age.  Obese Head: Normocephalic, without obvious abnormality, atraumatic. Eyes: Conjunctivae clear, anicteric sclerae. Pupils are equal ENT Nares normal. No drainage or sinus tenderness. Lips, mucosa, and tongue normal. No Thrush Neck: Supple, symmetrical, no adenopathy, thyroid: non tender no carotid bruit and no JVD. Back: No CVA tenderness. Lungs: Clear to auscultation bilaterally. No Wheezing  or Rhonchi. No rales. Heart: Regular rate and rhythm, no murmur, rub or gallop. Abdomen: Soft, non-tender,not distended. Bowel sounds normal. No masses Extremities: Left foot fifth toe amputation site healed well the distal aspect.  In the proximal area there is a small  fluctuant collection.  No obvious discharge     skin: No rashes or lesions. Or bruising Lymph: Cervical, supraclavicular normal. Neurologic: Grossly non-focal Pertinent Labs Lab Results CBC    Component Value Date/Time   WBC 8.1 10/25/2018 1241   RBC 4.26 10/25/2018 1241   HGB 12.4 (L) 10/25/2018 1241   HCT 38.2 (L) 10/25/2018 1241   PLT 404 (H) 10/25/2018 1241   MCV 89.7 10/25/2018 1241   MCH 29.1 10/25/2018 1241   MCHC 32.5 10/25/2018 1241   RDW 13.1 10/25/2018 1241   LYMPHSABS 2.0 10/25/2018 1241   MONOABS 0.8 10/25/2018 1241   EOSABS 0.1 10/25/2018 1241   BASOSABS 0.1 10/25/2018 1241    CMP Latest Ref Rng & Units 10/25/2018 08/08/2018 08/02/2018  Glucose 70 - 99 mg/dL 707(E) 675(Q) -  BUN 6 - 20 mg/dL 49(E) 16 01(E)  Creatinine 0.61 - 1.24 mg/dL 0.71 2.19 7.58  Sodium 135 - 145 mmol/L 135 133(L) -  Potassium 3.5 - 5.1 mmol/L 4.5 4.6 -  Chloride 98 - 111 mmol/L 99 98 -  CO2 22 - 32 mmol/L 26 27 -  Calcium 8.9 - 10.3 mg/dL 9.2 9.1 -  Total Protein 6.5 - 8.1 g/dL 7.7 - -  Total Bilirubin 0.3 - 1.2 mg/dL 0.4 - -  Alkaline Phos 38 - 126 U/L 110 - -  AST 15 - 41 U/L 13(L) - -  ALT 0 - 44 U/L 17 - -      Microbiology: Recent Results (from the past 240 hour(s))  SARS Coronavirus 2 [facility] order, Performed in [facility] hospital lab)     Status: None   Collection Time: 10/25/18  2:50 PM  Result Value Ref Range Status   SARS Coronavirus 2 NEGATIVE NEGATIVE Final    Comment: (NOTE) If result is NEGATIVE SARS-CoV-2 target nucleic acids are NOT DETECTED. The SARS-CoV-2 RNA is generally detectable in upper and lower  respiratory specimens during the acute phase of infection. The lowest  concentration of SARS-CoV-2 viral copies this assay can detect is 250  copies / mL. A negative result does not preclude SARS-CoV-2 infection  and should not be used as the sole basis for treatment or other  patient management decisions.  A negative result may occur with  improper  specimen collection / handling, submission of specimen other  than nasopharyngeal swab, presence of viral mutation(s) within the  areas targeted by this assay, and inadequate number of viral copies  (<250 copies / mL). A negative result must be combined with clinical  observations, patient history, and epidemiological information. If result is POSITIVE SARS-CoV-2 target nucleic acids are DETECTED. The SARS-CoV-2 RNA is generally detectable in upper and lower  respiratory specimens dur ing the acute phase of infection.  Positive  results are indicative of active infection with SARS-CoV-2.  Clinical  correlation with patient history and other diagnostic information is  necessary to determine patient infection status.  Positive results do  not rule out bacterial infection or co-infection with other viruses. If result is PRESUMPTIVE POSTIVE SARS-CoV-2 nucleic acids MAY BE PRESENT.   A presumptive positive result was obtained on the submitted specimen  and confirmed on repeat testing.  While 2019 novel coronavirus  (SARS-CoV-2) nucleic acids may be  present in the submitted sample  additional confirmatory testing may be necessary for epidemiological  and / or clinical management purposes  to differentiate between  SARS-CoV-2 and other Sarbecovirus currently known to infect humans.  If clinically indicated additional testing with an alternate test  methodology [phone]([id number]) is advised. The SARS-CoV-2 RNA is generally  detectable in upper and lower respiratory sp ecimens during the acute  phase of infection. The expected result is Negative. Fact Sheet for Patients:  BoilerBrush.com.cyhttps://www.fda.gov/media/136312/download Fact Sheet for Healthcare Providers: https://pope.com/https://www.fda.gov/media/136313/download This test is not yet approved or cleared by the [place] FDA and has been authorized for detection and/or diagnosis of SARS-CoV-2 by FDA under an Emergency Use Authorization (EUA).  This EUA will remain in  effect (meaning this test can be used) for the duration of the COVID-19 declaration under Section 564(b)(1) of the Act, 21 U.S.C. section 360bbb-3(b)(1), unless the authorization is terminated or revoked sooner. Performed at [facility], [address] [place], [place] [address]     IMAGING RESULTS: MRI pending I have personally reviewed the films ? Impression/Recommendation ?58 year old male with history of diabetes mellitus, hypertension, hyperlipidemia and recent fifth ray amputation of the left foot admitted with residual infection and a fluctuant collection and erosion in the x-ray concerning for persistent osteomyelitis.  Diabetic foot infection with osteomyelitis.  As the patient is hemodynamically stable with no fever or chills and normal white count may even withhold antibiotics until surgery.  But he is currently on vancomycin for previous MRSA infection.  He has  also been started on Zosyn.  Would avoid this combination.  He is going for surgery tomorrow.  MRI pending.    Peripheral arterial disease with recent revascularization.  On Plavix and aspirin.   Diabetes mellitus on insulin  Hypertension on amlodipine, hydrochlorothiazide and lisinopril.  Also on metoprolol.  Hyperlipidemia on atorvastatin   ? ? ___________________________________________________ Discussed with patient, and Dr. [name] Note:  This document was prepared using Dragon voice recognition software and may include unintentional dictation errors.

## 2018-10-25 NOTE — Consult Note (Signed)
ORTHOPAEDIC CONSULTATION  REQUESTING PHYSICIAN: Lang Snow,*  Chief Complaint: Left foot infection  HPI: Joseph Singh is a 58 y.o. male who complains of worsening left foot infection.  Patient underwent surgical debridement of necrotic bone and soft tissue on his left fifth metatarsal by myself in June.  The wound was healing well until he recently developed an area of drainage at the base of the fifth metatarsal laterally along the left foot.  Seen in the outpatient clinic today and x-ray showed obvious erosive change consistent with osteomyelitis to the lateral left foot.  He is diabetic with neuropathy.  He has undergone revascularization by vascular surgery recently.  He is admitted today for antibiotics and plan for surgical debridement of the foot.  We will plan for an MRI today to evaluate the extent of the infection.  Past Medical History:  Diagnosis Date  . Arthritis   . Diabetes mellitus without complication ([place])   . History of kidney stones    h/o  . Hyperlipidemia   . Hypertension   . Neuropathy    Past Surgical History:  Procedure Laterality Date  . AMPUTATION TOE Left 08/10/2018   Procedure: RAY LEFT;  Surgeon: Samara Deist, DPM;  Location: [facility] OR[facility];  Service: Podiatry;  Laterality: Left;  . LOWER EXTREMITY ANGIOGRAPHY Right 02/25/2016   Procedure: Lower Extremity Angiography;  Surgeon: Algernon Huxley, MD;  Location: [place] [facility];  Service: Cardiovascular;  Laterality: Right;  . LOWER EXTREMITY ANGIOGRAPHY Right 02/27/2017   Procedure: LOWER EXTREMITY ANGIOGRAPHY;  Surgeon: Algernon Huxley, MD;  Location: [place] [facility];  Service: Cardiovascular;  Laterality: Right;  . LOWER EXTREMITY ANGIOGRAPHY Left 08/06/2018   Procedure: LOWER EXTREMITY ANGIOGRAPHY;  Surgeon: Algernon Huxley, MD;  Location: [place] [facility];  Service: Cardiovascular;  Laterality: Left;  . LOWER EXTREMITY INTERVENTION  02/25/2016   Procedure: Lower Extremity Intervention;  Surgeon:  Algernon Huxley, MD;  Location: [place] [facility];  Service: Cardiovascular;;  . TOE AMPUTATION     Social History   Socioeconomic History  . Marital status: Married    Spouse name: Not on file  . Number of children: Not on file  . Years of education: Not on file  . Highest education level: Not on file  Occupational History  . Not on file  Social Needs  . Financial resource strain: Not hard at all  . Food insecurity    Worry: Never true    Inability: Never true  . Transportation needs    Medical: No    Non-medical: No  Tobacco Use  . Smoking status: Former Smoker    Packs/day: 0.50    Years: 35.00    Pack years: 17.50    Types: Cigarettes    Quit date: 08/09/2018    Years since quitting: 0.2  . Smokeless tobacco: Never Used  Substance and Sexual Activity  . Alcohol use: No    Alcohol/week: 0.0 standard drinks  . Drug use: No  . Sexual activity: Yes    Partners: Female    Comment: Wife  Lifestyle  . Physical activity    Days per week: 3 days    Minutes per session: 20 min  . Stress: Not at all  Relationships  . Social Herbalist on phone: Not on file    Gets together: Not on file    Attends religious service: Not on file    Active member of club or organization: Not on file  Attends meetings of clubs or organizations: Not on file    Relationship status: Married  Other Topics Concern  . Not on file  Social History Narrative   Permanently disabled   [employer] on the job and hurt his shoulder    Lives with wife and 2 children    1 grandchild from a previous marriage    Pets: Not inside    GED    Right handed    Caffeine- 2-3 sodas, tea, no coffee    Enjoys watching tv and being outside    Family History  Problem Relation Age of Onset  . Diabetes Mother   . Heart disease Father   . Diabetes Sister   . Diabetes Brother   . Cancer Sister        lung  . Cancer Sister        breast   No Known Allergies Prior to Admission medications   Medication  Sig Start Date End Date Taking? Authorizing Provider  amLODipine (NORVASC) 10 MG tablet TAKE ONE (1) TABLET EACH DAY Patient taking differently: Take 10 mg by mouth every morning.  11/22/17   Leone Haven, MD  aspirin EC 81 MG tablet Take 1 tablet (81 mg total) by mouth daily. 02/28/17   Algernon Huxley, MD  atorvastatin (LIPITOR) 40 MG tablet TAKE ONE (1) TABLET EACH DAY 09/21/18   Leone Haven, MD  blood glucose meter kit and supplies KIT E11.51, check 2x daily, dispense based on patient and insurance preference 05/03/17   Leone Haven, MD  cephALEXin (KEFLEX) 500 MG capsule Take 1 capsule (500 mg total) by mouth 4 (four) times daily. 06/14/18   Jodelle Green, FNP  clopidogrel (PLAVIX) 75 MG tablet TAKE ONE (1) TABLET EACH DAY 08/22/18   Leone Haven, MD  cyclobenzaprine (FLEXERIL) 10 MG tablet TAKE ONE TABLET BY MOUTH 3 TIMES DAILY AS NEEDED FOR MUSCLE SPASMS. Patient taking differently: Take 10 mg by mouth 3 (three) times daily as needed for muscle spasms.  10/04/17   Leone Haven, MD  doxycycline (VIBRA-TABS) 100 MG tablet Take 1 tablet (100 mg total) by mouth 2 (two) times daily. 10/22/18   Jodelle Green, FNP  empagliflozin (JARDIANCE) 25 MG TABS tablet Take 25 mg by mouth daily. 01/29/18   Leone Haven, MD  fluticasone (FLONASE) 50 MCG/ACT nasal spray Place 2 sprays into both nostrils daily. 10/22/18   Jodelle Green, FNP  gabapentin (NEURONTIN) 300 MG capsule Take 1 capsule (300 mg total) by mouth 4 (four) times daily. 04/26/18   Leone Haven, MD  glipiZIDE (GLUCOTROL XL) 10 MG 24 hr tablet TAKE ONE (1) TABLET EACH DAY WITH BREAKFAST 08/22/18   Leone Haven, MD  glucose blood (ONE TOUCH ULTRA TEST) test strip Check blood sugar twice daily Dx code: E11.9 11/07/17   Leone Haven, MD  HYDROcodone-acetaminophen (NORCO) 5-325 MG tablet Take 1 tablet by mouth every 6 (six) hours as needed for moderate pain. 08/10/18   Samara Deist, DPM   lisinopril-hydrochlorothiazide (PRINZIDE,ZESTORETIC) 20-25 MG tablet TAKE 1 TABLET BY MOUTH EVERY DAY Patient taking differently: Take 0.5 tablets by mouth daily.  06/05/18   Leone Haven, MD  metFORMIN (GLUCOPHAGE) 1000 MG tablet TAKE 1 TABLET BY MOUTH TWICE DAILY WITH A MEAL Patient taking differently: Take 1,000 mg by mouth 2 (two) times daily with a meal.  03/16/17   Leone Haven, MD  metoprolol succinate (TOPROL-XL) 50 MG 24 hr tablet Take  1 tablet (50 mg total) by mouth daily. Patient taking differently: Take 50 mg by mouth every morning.  04/10/18   Leone Haven, MD  mupirocin ointment (BACTROBAN) 2 % Place thin layer of ointment over red area of skin on left leg 2 times per day for 7 days. 06/20/18   Jodelle Green, FNP  sulfamethoxazole-trimethoprim (BACTRIM DS) 800-160 MG tablet Take 1 tablet by mouth 2 (two) times daily. for 10 days 07/10/18   [provider]  TRADJENTA 5 MG TABS tablet TAKE 1 TABLET BY MOUTH EVERY DAY 08/13/18   Leone Haven, MD  traMADol (ULTRAM) 50 MG tablet TAKE 1 TABLET BY MOUTH THREE TIMES A DAY 10/21/18   Leone Haven, MD  Vitamin D, Ergocalciferol, (DRISDOL) 50000 units CAPS capsule Take 1 capsule (50,000 Units total) by mouth every 7 (seven) days. 05/08/17   Leone Haven, MD   No results found.  Positive ROS: All other systems have been reviewed and were otherwise negative with the exception of those mentioned in the HPI and as above.  12 point ROS was performed.  Physical Exam: General: Alert and oriented.  No apparent distress.  Vascular:  Left foot:Dorsalis Pedis:  diminished Posterior Tibial:  diminished  Right foot: Dorsalis Pedis:  diminished Posterior Tibial:  diminished  Neuro:absent protective sensation  Derm: Right foot without ulceration.  At the fifth metatarsal base area there is a noted draining area that has fluctuance to the region.  This does probe into the bone at this time.  The distal portion  of the incision along the entire fifth ray has healed quite nicely though.  Outpatient x-ray shows erosive changes of the mild residual fifth metatarsal base at this time.  Ortho/MS: Edema to the left foot.  Status post fifth ray amputation as well as fourth toe amputation on the left foot.  Assessment: Osteomyelitis fifth metatarsal left foot Diabetes with diabetic foot infection left foot  Plan: We will order an MRI of the left foot to evaluate the extent of the osteomyelitis.  Expect will have to remove all of the remaining fifth metatarsal base area as this was quite destroyed on x-ray.  Will evaluate for further abscess into the region.  Patient will likely need IV antibiotics and will consult infectious disease as well.  Plan for surgical debridement tomorrow in the OR.  I discussed this with the patient in detail and consent has been given.    Elesa Hacker, DPM Cell [phone]   10/25/2018 12:31 PM

## 2018-10-25 NOTE — Consult Note (Addendum)
Pharmacy Antibiotic Note  Joseph Singh is a 58 y.o. male admitted on 10/25/2018 with wound infection. Patient presents c/o worsening left foot infection with drainage. Recent debridement of left fifth metatarsal in June. Patient was seen by podiatry outpatient today where x-ray demonstrated changes consistent with osteomyelitis. Patient is also on oral doxycycline for a right ear infection. Pharmacy has been consulted for vancomycin and pip/tazo dosing. ID to evaluate antibiotic therapy regimen after assessment.   At time of note patient does not have IV access and ordered vancomycin loading note has not been given.   Plan: Pip/tazo 3.375g q8h EI  Vancomycin 2 g LD x1 followed by a maintenance dose of 1.25 g q12h.  Goal AUC 400-550. Expected AUC: 476.6, trough 13.4 SCr used: 1.09    Temp (24hrs), Avg:98.3 F (36.8 C), Min:98.3 F (36.8 C), Max:98.3 F (36.8 C)  Recent Labs  Lab 10/25/18 1241  WBC 8.1  CREATININE 1.09    Estimated Creatinine Clearance: 108.8 mL/min (by C-G formula based on SCr of 1.09 mg/dL).    No Known Allergies  Antimicrobials this admission: Vancomycin 9/3 >>  Pip/tazo 9/3 >>  Doxycycline 9/3 >>  Dose adjustments this admission: N/A  Microbiology results: 9/3 BCx: pending  Thank you for allowing pharmacy to be a part of this patient's care.  Benita Gutter 10/25/2018 2:00 PM

## 2018-10-25 NOTE — Evaluation (Signed)
Physical Therapy Evaluation Patient Details Name: CLEM WISENBAKER MRN: [id number] DOB: 1960-07-07 Today's Date: 10/25/2018   History of Present Illness  58 y.o. male with past medical history of diabetes mellitus, kidney stones, hyperlipidemia, hypertension, neuropathy, nonhealing ulceration on the left foot, former smoker, PAD with recent revascularization by vascular surgery and recent diagnosis of right ear infection with bilateral effusion presenting as a direct admit from podiatry clinic with left foot osteomyelitis.  Pt is scheduled to have I&D of L foot 9/4.   Clinical Impression  Pt did very well with bed mobility and had no concerns at all there, however he fatigued very quickly in trying to ambulate (~15 ft) with b/l triceps/UE "buring." (walker at appropriate and most efficient setting for this 6'4" patient).  Initial plan was to trial NWBing on steps today however he was too fatigued with the minimal ambulation effort.  Pt may end up needing EMS to assist into house when it is ultimately time for d/c home.    Follow Up Recommendations Follow surgeon's recommendation for DC plan and follow-up therapies    Equipment Recommendations  Rolling walker with 5" wheels    Recommendations for Other Services       Precautions / Restrictions Precautions Precautions: Fall Restrictions Weight Bearing Restrictions: Yes LLE Weight Bearing: Non weight bearing      Mobility  Bed Mobility Overal bed mobility: Independent             General bed mobility comments: Pt easily able to get to/from sitting/supine w/o assist  Transfers Overall transfer level: Modified independent Equipment used: Rolling walker (2 wheeled)             General transfer comment: Pt did put light amount of weight through L heel while getting to standing but with cuing was quickly able to get L foot off surface  Ambulation/Gait Ambulation/Gait assistance: Supervision Gait Distance (Feet): 15  Feet Assistive device: Rolling walker (2 wheeled)       General Gait Details: Pt was able to maintain L NWBing well with limited ambulation but fatigued very quickly in UEs and though he was motivated to push himself he couldn't manage more than the 15 ft before needing to sit  Stairs Stairs: (likely will be tough to maintain NWBing and do)          Wheelchair Mobility    Modified Rankin (Stroke Patients Only)       Balance Overall balance assessment: Modified Independent                                           Pertinent Vitals/Pain Pain Assessment: 0-10 Pain Score: 4  Pain Location: lateral L foot    Home Living Family/patient expects to be discharged to:: Private residence Living Arrangements: Spouse/significant other;Children Available Help at Discharge: Family;Available 24 hours/day   Home Access: Stairs to enter Entrance Stairs-Rails: Can reach both Entrance Stairs-Number of Steps: 7 Home Layout: One level Home Equipment: None(may be able to borrow knee scooter)      Prior Function Level of Independence: Independent         Comments: Pt has been able to manage in home mobility, less active recently     Hand Dominance        Extremity/Trunk Assessment   Upper Extremity Assessment Upper Extremity Assessment: Overall WFL for tasks assessed    Lower Extremity Assessment  Lower Extremity Assessment: Overall WFL for tasks assessed       Communication   Communication: No difficulties  Cognition Arousal/Alertness: Awake/alert Behavior During Therapy: WFL for tasks assessed/performed Overall Cognitive Status: Within Functional Limits for tasks assessed                                        General Comments      Exercises     Assessment/Plan    PT Assessment Patient needs continued PT services  PT Problem List Decreased strength;Decreased range of motion;Decreased activity tolerance;Decreased  balance;Decreased mobility;Decreased coordination;Decreased knowledge of use of DME;Decreased safety awareness;Decreased knowledge of precautions       PT Treatment Interventions Gait training;Stair training;Functional mobility training;DME instruction;Therapeutic activities;Therapeutic exercise;Balance training;Patient/family education    PT Goals (Current goals can be found in the Care Plan section)  Acute Rehab PT Goals Patient Stated Goal: go home PT Goal Formulation: With patient Time For Goal Achievement: 11/08/18 Potential to Achieve Goals: Good    Frequency Min 2X/week   Barriers to discharge        Co-evaluation               AM-PAC PT "6 Clicks" Mobility  Outcome Measure Help needed turning from your back to your side while in a flat bed without using bedrails?: None Help needed moving from lying on your back to sitting on the side of a flat bed without using bedrails?: None Help needed moving to and from a bed to a chair (including a wheelchair)?: None Help needed standing up from a chair using your arms (e.g., wheelchair or bedside chair)?: A Little Help needed to walk in hospital room?: A Little Help needed climbing 3-5 steps with a railing? : A Lot 6 Click Score: 20    End of Session Equipment Utilized During Treatment: Gait belt Activity Tolerance: Patient limited by fatigue Patient left: in chair;with nursing/sitter in room Nurse Communication: Mobility status PT Visit Diagnosis: Muscle weakness (generalized) (M62.81);Difficulty in walking, not elsewhere classified (R26.2)    Time: [id number] PT Time Calculation (min) (ACUTE ONLY): 23 min   Charges:   PT Evaluation $PT Eval Low Complexity: 1 Low          Malachi ProGalen R Jammy Stlouis, DPT 10/25/2018, 3:29 PM

## 2018-10-26 ENCOUNTER — Inpatient Hospital Stay: Payer: Medicare Other | Admitting: Certified Registered"

## 2018-10-26 ENCOUNTER — Encounter: Payer: Self-pay | Admitting: *Deleted

## 2018-10-26 ENCOUNTER — Encounter
Admission: AD | Disposition: A | Discharge status: Home Health | Payer: Self-pay | Source: Ambulatory Visit | Attending: Internal Medicine

## 2018-10-26 DIAGNOSIS — Z8614 Personal history of Methicillin resistant Staphylococcus aureus infection: Secondary | ICD-10-CM

## 2018-10-26 DIAGNOSIS — Z9862 Peripheral vascular angioplasty status: Secondary | ICD-10-CM

## 2018-10-26 HISTORY — PX: IRRIGATION AND DEBRIDEMENT FOOT: SHX6602

## 2018-10-26 LAB — SURGICAL PCR SCREEN
MRSA, PCR: NEGATIVE
Staphylococcus aureus: NEGATIVE

## 2018-10-26 LAB — CREATININE, SERUM
Creatinine, Ser: 0.97 mg/dL (ref 0.61–1.24)
GFR calc Af Amer: >60 mL/min (ref >60)
GFR calc non Af Amer: >60 mL/min (ref >60)

## 2018-10-26 LAB — GLUCOSE, CAPILLARY
Glucose-Capillary: 88 mg/dL (ref 70–99)
Glucose-Capillary: 91 mg/dL (ref 70–99)
Glucose-Capillary: 124 mg/dL — ABNORMAL HIGH (ref 70–99)
Glucose-Capillary: 222 mg/dL — ABNORMAL HIGH (ref 70–99)
Glucose-Capillary: 441 mg/dL — ABNORMAL HIGH (ref 70–99)

## 2018-10-26 LAB — HIV ANTIBODY (ROUTINE TESTING W REFLEX): HIV Screen 4th Generation wRfx: NONREACTIVE

## 2018-10-26 LAB — GLUCOSE, RANDOM: Glucose, Bld: 431 mg/dL — ABNORMAL HIGH (ref 70–99)

## 2018-10-26 SURGERY — IRRIGATION AND DEBRIDEMENT FOOT
Anesthesia: General | Site: Foot | Laterality: Left

## 2018-10-26 MED ORDER — BUPIVACAINE-EPINEPHRINE (PF) 0.25% -1:200000 IJ SOLN
INTRAMUSCULAR | Status: AC
  Filled 2018-10-26: qty 30

## 2018-10-26 MED ORDER — SODIUM CHLORIDE 0.9 % IV SOLN
INTRAVENOUS | Status: DC | PRN
  Administered 2018-10-26: 25 ug/min via INTRAVENOUS

## 2018-10-26 MED ORDER — VANCOMYCIN HCL 1.25 G IV SOLR
1250.0000 mg | Freq: Two times a day (BID) | INTRAVENOUS | Status: DC
  Administered 2018-10-26 – 2018-10-29 (×6): 1250 mg via INTRAVENOUS
  Filled 2018-10-26 (×7): qty 1250

## 2018-10-26 MED ORDER — EPHEDRINE SULFATE 50 MG/ML IJ SOLN
INTRAMUSCULAR | Status: DC | PRN
  Administered 2018-10-26 (×3): 5 mg via INTRAVENOUS
  Administered 2018-10-26: 10 mg via INTRAVENOUS
  Administered 2018-10-26 (×2): 5 mg via INTRAVENOUS

## 2018-10-26 MED ORDER — FENTANYL CITRATE (PF) 100 MCG/2ML IJ SOLN
INTRAMUSCULAR | Status: DC | PRN
  Administered 2018-10-26 (×2): 50 ug via INTRAVENOUS

## 2018-10-26 MED ORDER — GLYCOPYRROLATE 0.2 MG/ML IJ SOLN
INTRAMUSCULAR | Status: AC
  Filled 2018-10-26: qty 1

## 2018-10-26 MED ORDER — INSULIN ASPART 100 UNIT/ML ~~LOC~~ SOLN: 0.0000 [IU] | Freq: Every day | SUBCUTANEOUS | Status: DC

## 2018-10-26 MED ORDER — PROPOFOL 10 MG/ML IV BOLUS
INTRAVENOUS | Status: AC
  Filled 2018-10-26: qty 20

## 2018-10-26 MED ORDER — ENSURE PRE-SURGERY PO LIQD
296.0000 mL | Freq: Once | ORAL | Status: AC
  Filled 2018-10-26: qty 296

## 2018-10-26 MED ORDER — PROPOFOL 10 MG/ML IV BOLUS
INTRAVENOUS | Status: DC | PRN
  Administered 2018-10-26 (×2): 100 mg via INTRAVENOUS

## 2018-10-26 MED ORDER — GLYCOPYRROLATE 0.2 MG/ML IJ SOLN
INTRAMUSCULAR | Status: DC | PRN
  Administered 2018-10-26: 0.2 mg via INTRAVENOUS

## 2018-10-26 MED ORDER — DEXAMETHASONE SODIUM PHOSPHATE 10 MG/ML IJ SOLN
INTRAMUSCULAR | Status: AC
  Filled 2018-10-26: qty 1

## 2018-10-26 MED ORDER — ONDANSETRON HCL 4 MG/2ML IJ SOLN
INTRAMUSCULAR | Status: AC
  Filled 2018-10-26: qty 2

## 2018-10-26 MED ORDER — DEXMEDETOMIDINE HCL 200 MCG/2ML IV SOLN
INTRAVENOUS | Status: DC | PRN
  Administered 2018-10-26: 8 ug via INTRAVENOUS

## 2018-10-26 MED ORDER — INSULIN ASPART 100 UNIT/ML ~~LOC~~ SOLN
5.0000 [IU] | Freq: Once | SUBCUTANEOUS | Status: AC
  Administered 2018-10-26: 5 [IU] via SUBCUTANEOUS
  Filled 2018-10-26: qty 1

## 2018-10-26 MED ORDER — BUPIVACAINE LIPOSOME 1.3 % IJ SUSP
INTRAMUSCULAR | Status: AC
  Filled 2018-10-26: qty 20

## 2018-10-26 MED ORDER — ONDANSETRON HCL 4 MG/2ML IJ SOLN
INTRAMUSCULAR | Status: DC | PRN
  Administered 2018-10-26: 4 mg via INTRAVENOUS

## 2018-10-26 MED ORDER — MORPHINE SULFATE (PF) 4 MG/ML IV SOLN: 2.0000 mg | INTRAVENOUS | Status: DC | PRN

## 2018-10-26 MED ORDER — OXYCODONE-ACETAMINOPHEN 5-325 MG PO TABS
1.0000 | ORAL_TABLET | Freq: Four times a day (QID) | ORAL | Status: DC | PRN
  Administered 2018-10-26: 1 via ORAL
  Administered 2018-10-27 – 2018-10-30 (×11): 2 via ORAL
  Administered 2018-10-30: 1 via ORAL
  Administered 2018-10-30: 13:00:00 2 via ORAL
  Filled 2018-10-26 (×6): qty 2
  Filled 2018-10-26: qty 1
  Filled 2018-10-26 (×2): qty 2
  Filled 2018-10-26: qty 1
  Filled 2018-10-26 (×3): qty 2
  Filled 2018-10-26: qty 1
  Filled 2018-10-26: qty 2

## 2018-10-26 MED ORDER — PHENYLEPHRINE HCL (PRESSORS) 10 MG/ML IV SOLN
INTRAVENOUS | Status: DC | PRN
  Administered 2018-10-26 (×4): 100 ug via INTRAVENOUS

## 2018-10-26 MED ORDER — DEXAMETHASONE SODIUM PHOSPHATE 10 MG/ML IJ SOLN
INTRAMUSCULAR | Status: DC | PRN
  Administered 2018-10-26: 5 mg via INTRAVENOUS

## 2018-10-26 MED ORDER — TOBRAMYCIN SULFATE 1.2 G IJ SOLR
INTRAMUSCULAR | Status: AC
  Filled 2018-10-26: qty 1.2

## 2018-10-26 MED ORDER — SUCCINYLCHOLINE CHLORIDE 20 MG/ML IJ SOLN
INTRAMUSCULAR | Status: AC
  Filled 2018-10-26: qty 1

## 2018-10-26 MED ORDER — ESMOLOL HCL 100 MG/10ML IV SOLN
INTRAVENOUS | Status: DC | PRN
  Administered 2018-10-26: 10 mg via INTRAVENOUS

## 2018-10-26 MED ORDER — LACTATED RINGERS IV SOLN
INTRAVENOUS | Status: DC | PRN
  Administered 2018-10-26: 12:00:00 via INTRAVENOUS

## 2018-10-26 MED ORDER — FENTANYL CITRATE (PF) 100 MCG/2ML IJ SOLN: 25.0000 ug | INTRAMUSCULAR | Status: DC | PRN

## 2018-10-26 MED ORDER — SUGAMMADEX SODIUM 500 MG/5ML IV SOLN
INTRAVENOUS | Status: DC | PRN
  Administered 2018-10-26: 400 mg via INTRAVENOUS

## 2018-10-26 MED ORDER — ONDANSETRON HCL 4 MG/2ML IJ SOLN: 4.0000 mg | Freq: Once | INTRAMUSCULAR | Status: DC | PRN

## 2018-10-26 MED ORDER — GENTAMICIN SULFATE 40 MG/ML IJ SOLN
INTRAMUSCULAR | Status: AC
  Filled 2018-10-26: qty 2

## 2018-10-26 MED ORDER — SUCCINYLCHOLINE CHLORIDE 20 MG/ML IJ SOLN
INTRAMUSCULAR | Status: DC | PRN
  Administered 2018-10-26: 30 mg via INTRAVENOUS
  Administered 2018-10-26: 120 mg via INTRAVENOUS

## 2018-10-26 MED ORDER — BUPIVACAINE-EPINEPHRINE 0.25% -1:200000 IJ SOLN
INTRAMUSCULAR | Status: DC | PRN
  Administered 2018-10-26: 20 mL

## 2018-10-26 MED ORDER — BUPIVACAINE HCL (PF) 0.5 % IJ SOLN
INTRAMUSCULAR | Status: AC
  Filled 2018-10-26: qty 30

## 2018-10-26 MED ORDER — ROCURONIUM BROMIDE 50 MG/5ML IV SOLN
INTRAVENOUS | Status: AC
  Filled 2018-10-26: qty 1

## 2018-10-26 MED ORDER — THROMBIN 5000 UNITS EX SOLR
CUTANEOUS | Status: AC
  Filled 2018-10-26: qty 5000

## 2018-10-26 MED ORDER — ROCURONIUM BROMIDE 100 MG/10ML IV SOLN
INTRAVENOUS | Status: DC | PRN
  Administered 2018-10-26: 10 mg via INTRAVENOUS
  Administered 2018-10-26: 20 mg via INTRAVENOUS

## 2018-10-26 MED ORDER — THROMBIN 20000 UNITS EX KIT
PACK | CUTANEOUS | Status: AC
  Filled 2018-10-26: qty 1

## 2018-10-26 MED ORDER — LIDOCAINE HCL (PF) 2 % IJ SOLN
INTRAMUSCULAR | Status: AC
  Filled 2018-10-26: qty 10

## 2018-10-26 MED ORDER — MIDAZOLAM HCL 2 MG/2ML IJ SOLN
INTRAMUSCULAR | Status: AC
  Filled 2018-10-26: qty 2

## 2018-10-26 MED ORDER — FENTANYL CITRATE (PF) 100 MCG/2ML IJ SOLN
INTRAMUSCULAR | Status: AC
  Filled 2018-10-26: qty 2

## 2018-10-26 MED ORDER — LIDOCAINE HCL (CARDIAC) PF 100 MG/5ML IV SOSY
PREFILLED_SYRINGE | INTRAVENOUS | Status: DC | PRN
  Administered 2018-10-26: 80 mg via INTRAVENOUS

## 2018-10-26 MED ORDER — MIDAZOLAM HCL 2 MG/2ML IJ SOLN
INTRAMUSCULAR | Status: DC | PRN
  Administered 2018-10-26 (×2): 1 mg via INTRAVENOUS

## 2018-10-26 MED ORDER — VANCOMYCIN HCL 1000 MG IV SOLR
INTRAVENOUS | Status: AC
  Filled 2018-10-26: qty 1000

## 2018-10-26 MED ORDER — THROMBIN 5000 UNITS EX SOLR
CUTANEOUS | Status: DC | PRN
  Administered 2018-10-26: 5000 [IU] via TOPICAL

## 2018-10-26 SURGICAL SUPPLY — 72 items
BLADE OSC/SAGITTAL MD 5.5X18 (BLADE) IMPLANT
BLADE OSCILLATING/SAGITTAL (BLADE)
BLADE SURG 15 STRL LF DISP TIS (BLADE) IMPLANT
BLADE SURG 15 STRL SS (BLADE) ×3
BLADE SW THK.38XMED LNG THN (BLADE) IMPLANT
BNDG COHESIVE 4X5 TAN STRL (GAUZE/BANDAGES/DRESSINGS) ×1 IMPLANT
BNDG COHESIVE 6X5 TAN STRL LF (GAUZE/BANDAGES/DRESSINGS) ×2 IMPLANT
BNDG CONFORM 2 STRL LF (GAUZE/BANDAGES/DRESSINGS) ×2 IMPLANT
BNDG CONFORM 3 STRL LF (GAUZE/BANDAGES/DRESSINGS) ×2 IMPLANT
BNDG ELASTIC 4X5.8 VLCR STR LF (GAUZE/BANDAGES/DRESSINGS) ×2 IMPLANT
BNDG ESMARK 4X12 TAN STRL LF (GAUZE/BANDAGES/DRESSINGS) ×1 IMPLANT
BNDG GAUZE 4.5X4.1 6PLY STRL (MISCELLANEOUS) ×2 IMPLANT
CANISTER SUCT 1200ML W/VALVE (MISCELLANEOUS) ×2 IMPLANT
CANISTER SUCT 3000ML PPV (MISCELLANEOUS) ×2 IMPLANT
CNTNR SPEC 2.5X3XGRAD LEK (MISCELLANEOUS) ×2
CONT SPEC 4OZ STER OR WHT (MISCELLANEOUS) ×2
CONTAINER SPEC 2.5X3XGRAD LEK (MISCELLANEOUS) IMPLANT
COVER WAND RF STERILE (DRAPES) ×2 IMPLANT
CUFF TOURN SGL QUICK 12 (TOURNIQUET CUFF) IMPLANT
CUFF TOURN SGL QUICK 18X4 (TOURNIQUET CUFF) ×1 IMPLANT
DRAIN HEMOVAC 7FR (DRAIN) ×1 IMPLANT
DRAPE FLUOR MINI C-ARM 54X84 (DRAPES) IMPLANT
DRAPE XRAY CASSETTE 23X24 (DRAPES) IMPLANT
DRESSING ALLEVYN 4X4 (MISCELLANEOUS) IMPLANT
DURAPREP 26ML APPLICATOR (WOUND CARE) ×2 IMPLANT
ELECT REM PT RETURN 9FT ADLT (ELECTROSURGICAL) ×2
ELECTRODE REM PT RTRN 9FT ADLT (ELECTROSURGICAL) ×1 IMPLANT
GAUZE PACKING 1/4 X5 YD (GAUZE/BANDAGES/DRESSINGS) ×2 IMPLANT
GAUZE PACKING IODOFORM 1X5 (MISCELLANEOUS) ×2 IMPLANT
GAUZE SPONGE 4X4 12PLY STRL (GAUZE/BANDAGES/DRESSINGS) ×2 IMPLANT
GAUZE XEROFORM 1X8 LF (GAUZE/BANDAGES/DRESSINGS) ×2 IMPLANT
GLOVE BIO SURGEON STRL SZ7.5 (GLOVE) ×2 IMPLANT
GLOVE INDICATOR 8.0 STRL GRN (GLOVE) ×2 IMPLANT
GOWN STRL REUS W/ TWL LRG LVL3 (GOWN DISPOSABLE) ×2 IMPLANT
GOWN STRL REUS W/TWL LRG LVL3 (GOWN DISPOSABLE) ×2
GOWN STRL REUS W/TWL MED LVL3 (GOWN DISPOSABLE) ×4 IMPLANT
HANDPIECE VERSAJET DEBRIDEMENT (MISCELLANEOUS) IMPLANT
IV NS 1000ML (IV SOLUTION) ×1
IV NS 1000ML BAXH (IV SOLUTION) ×1 IMPLANT
KIT STIMULAN RAPID CURE 5CC (Orthopedic Implant) ×1 IMPLANT
KIT TURNOVER KIT A (KITS) ×2 IMPLANT
LABEL OR SOLS (LABEL) ×1 IMPLANT
NDL FILTER BLUNT 18X1 1/2 (NEEDLE) ×1 IMPLANT
NDL HYPO 25X1 1.5 SAFETY (NEEDLE) ×1 IMPLANT
NEEDLE FILTER BLUNT 18X 1/2SAF (NEEDLE) ×1
NEEDLE FILTER BLUNT 18X1 1/2 (NEEDLE) ×1 IMPLANT
NEEDLE HYPO 25X1 1.5 SAFETY (NEEDLE) ×2 IMPLANT
NS IRRIG 500ML POUR BTL (IV SOLUTION) ×1 IMPLANT
PACK EXTREMITY ARMC (MISCELLANEOUS) ×2 IMPLANT
PAD ABD DERMACEA PRESS 5X9 (GAUZE/BANDAGES/DRESSINGS) ×2 IMPLANT
PULSAVAC PLUS IRRIG FAN TIP (DISPOSABLE)
RASP SM TEAR CROSS CUT (RASP) IMPLANT
SHIELD FULL FACE ANTIFOG 7M (MISCELLANEOUS) ×2 IMPLANT
SOL .9 NS 3000ML IRR  AL (IV SOLUTION)
SOL .9 NS 3000ML IRR UROMATIC (IV SOLUTION) ×1 IMPLANT
SOL PREP PVP 2OZ (MISCELLANEOUS) ×2
SOLUTION PREP PVP 2OZ (MISCELLANEOUS) ×1 IMPLANT
SPONGE LAP 18X18 RF (DISPOSABLE) ×1 IMPLANT
STOCKINETTE IMPERVIOUS 9X36 MD (GAUZE/BANDAGES/DRESSINGS) ×2 IMPLANT
SURGIFLO W/THROMBIN 8M KIT (HEMOSTASIS) ×1 IMPLANT
SUT ETHILON 2 0 FS 18 (SUTURE) ×6 IMPLANT
SUT ETHILON 4-0 (SUTURE)
SUT ETHILON 4-0 FS2 18XMFL BLK (SUTURE)
SUT VIC AB 2-0 SH 27 (SUTURE) ×1
SUT VIC AB 2-0 SH 27XBRD (SUTURE) IMPLANT
SUT VIC AB 3-0 SH 27 (SUTURE) ×2
SUT VIC AB 3-0 SH 27X BRD (SUTURE) ×1 IMPLANT
SUT VIC AB 4-0 FS2 27 (SUTURE) ×1 IMPLANT
SUTURE ETHLN 4-0 FS2 18XMF BLK (SUTURE) ×1 IMPLANT
SWAB CULTURE AMIES ANAERIB BLU (MISCELLANEOUS) IMPLANT
SYR 10ML LL (SYRINGE) ×4 IMPLANT
TIP FAN IRRIG PULSAVAC PLUS (DISPOSABLE) ×1 IMPLANT

## 2018-10-26 NOTE — Progress Notes (Signed)
Notified Dr Luana Shu of hemovac being pulled out. Received order to hold lovenox tonight.

## 2018-10-26 NOTE — Op Note (Signed)
Operative note   Surgeon:Lynniah Janoski    AssistArmed forces logistics/support/administrative officerant: None    Preop diagnosis: Osteomyelitis left foot    Postop diagnosis: Same    Procedure: 1.  Excision bone fifth metatarsal base left foot 2.  Excision entire fourth metatarsal left foot 3.  Excision entire cuboid left foot    EBL: 75 mL's    Anesthesia:local and general.  Local consisted of 20 cc of 0.25% bupivacaine with epinephrine    Hemostasis: Mid calf tourniquet inflated to 200 mmHg for 51 minutes    Specimen: Fifth metatarsal bone for culture and fifth metatarsal for pathology, fourth metatarsal for pathology, and cuboid for pathology.    Complications: None    Operative indications:Joseph Singh is an 58 y.o. that presents today for surgical intervention.  The risks/benefits/alternatives/complications have been discussed and consent has been given.    Procedure:  Prior to the procedure I had a long discussion regards to the MRI results.  This showed infection of the fifth metatarsal and fourth metatarsal as well as concern for infection of residual bones within the midportion of the foot.  We discussed the need to remove any grossly infected nonviable bone and tissue prior to surgery.  He understands he is at risk of further surgical debridement or loss of foot or limb.  Patient was brought into the OR and placed on the operating table in thesupine position. After anesthesia was obtained theleft lower extremity was prepped and draped in usual sterile fashion.  Attention was directed to the lateral aspect of the left foot where a dorsal lateral incision was made overlying the fourth and fifth metatarsal cuboid region.  Sharp and blunt dissection carried down to the fifth metatarsal where multiple fragmented fifth metatarsal pieces were noted at this time.  All of these were excised in toto and set aside for pathological examination.  A portion of the bone was sent for bone culture.  At this time evaluation of the fourth  metatarsal revealed the proximal one half of the fourth metatarsal was noted to be fragmented or necrotic in appearance grossly.  On MRI this was found to have osteomyelitis through most of the metatarsal.  At this time a metatarsectomy was performed of the entire fourth metatarsal.  This was taken from the surgical field and will be sent for pathological examination.  Gross examination of the cuboid noted erosive changes and fragmentation of the distal one half of the cuboid.  At this time the cuboid was excised from the surgical field in toto and disarticulated.  The wound was then flushed with copious amounts of irrigation.  No residual obvious necrotic or erosive bone changes were noted.  The third metatarsal base appeared to be intact at this time.  Concern for infection on MRI has been noted.  The wound was flushed with copious amounts of irrigation.  Bleeders were Bovie cauterized.  Tobramycin with vancomycin impregnated calcium sulfate beads were placed into the void from the cuboid region.  The area was infiltrated with Surgi-Flo with topical thrombin.  Closure was performed with a 3-0 Vicryl for the deeper layer.  2-0 nylon was used to close the skin.  A small Hemovac drain was placed into the wound.  A bulky sterile dressing was then applied to the left foot.    Patient tolerated the procedure and anesthesia well.  Was transported from the OR to the PACU with all vital signs stable and vascular status intact. To be discharged per routine protocol.  Will have  to monitor for healing and residual infection.  Given the excision of fourth and fifth metatarsals and the cuboid region concern for not having a functional limb is present.  Patient is at risk for further amputation of the forefoot.

## 2018-10-26 NOTE — Transfer of Care (Signed)
Immediate Anesthesia Transfer of Care Note  Patient: Joseph Singh  Procedure(s) Performed: IRRIGATION AND DEBRIDEMENT FOOT, LEFT (Left Foot)  Patient Location: PACU  Anesthesia Type:General  Level of Consciousness: awake, alert , oriented and patient cooperative  Airway & Oxygen Therapy: Patient Spontanous Breathing and Patient connected to face mask oxygen  Post-op Assessment: Report given to RN and Post -op Vital signs reviewed and stable  Post vital signs: Reviewed and stable  Last Vitals:  Vitals Value Taken Time  BP 107/67 10/26/18 1349  Temp    Pulse 77 10/26/18 1352  Resp 10 10/26/18 1352  SpO2 98 % 10/26/18 1352  Vitals shown include unvalidated device data.  Last Pain:  Vitals:   10/26/18 1107  TempSrc: Tympanic  PainSc: 0-No pain         Complications: No apparent anesthesia complications

## 2018-10-26 NOTE — Anesthesia Procedure Notes (Addendum)
Procedure Name: Intubation Performed by: Kelton Pillar, CRNA Pre-anesthesia Checklist: Patient identified, Emergency Drugs available, Suction available and Patient being monitored Patient Re-evaluated:Patient Re-evaluated prior to induction Oxygen Delivery Method: Circle system utilized Preoxygenation: Pre-oxygenation with 100% oxygen Induction Type: IV induction Ventilation: Mask ventilation without difficulty Laryngoscope Size: McGraph and 4 Grade View: Grade I Tube size: 7.5 mm Number of attempts: 1 Airway Equipment and Method: Stylet and Video-laryngoscopy Placement Confirmation: ETT inserted through vocal cords under direct vision,  positive ETCO2,  CO2 detector and breath sounds checked- equal and bilateral Secured at: 22 cm Tube secured with: Tape

## 2018-10-26 NOTE — Anesthesia Postprocedure Evaluation (Signed)
Anesthesia Post Note  Patient: Joseph Singh  Procedure(s) Performed: IRRIGATION AND DEBRIDEMENT FOOT, LEFT (Left Foot)  Patient location during evaluation: PACU Anesthesia Type: General Level of consciousness: awake and alert Pain management: pain level controlled Vital Signs Assessment: post-procedure vital signs reviewed and stable Respiratory status: spontaneous breathing and respiratory function stable Cardiovascular status: stable Anesthetic complications: no     Last Vitals:  Vitals:   10/26/18 1107 10/26/18 1349  BP: 125/64   Pulse: 63   Resp: 16 (P) 14  Temp: (!) 35.4 C (P) 36.5 C  SpO2: 100%     Last Pain:  Vitals:   10/26/18 1107  TempSrc: Tympanic  PainSc: 0-No pain                 KEPHART,WILLIAM K

## 2018-10-26 NOTE — Progress Notes (Signed)
CBG 441. Notified Dr Jannifer Franklin. Received new order. Will recheck CBG at midnight

## 2018-10-26 NOTE — Consult Note (Addendum)
Pharmacy Antibiotic Note  Joseph Singh is a 58 y.o. male admitted on 10/25/2018 with wound infection. Patient presents c/o worsening left foot infection with drainage. Recent debridement of left fifth metatarsal in June. Patient was seen by podiatry outpatient today where x-ray demonstrated changes consistent with osteomyelitis. Patient was on oral doxycycline for a right ear infection prior to admission since d/c. Pharmacy has been consulted for vancomycin and pip/tazo dosing. ID following.  MRI indicated extensive underlying osteomyelitis involving multiple toes. Abscesses noted within the fourth metatarsal. Patient to OR today for debridement today.   Plan: Pip/tazo 3.375g q8h EI  Continue vancomycin 1.25 g q12h.  Goal AUC 400-550. Expected AUC: 438.5, trough: 11.9 SCr used: 0.97  Will continue to follow daily Scr per policy. Consider ordering levels on 9/6-9/7 if remains stable on dose.   Height: 6\' 3"  (190.5 cm) Weight: 293 lb 3.4 oz (133 kg) IBW/kg (Calculated) : 84.5  Temp (24hrs), Avg:97.5 F (36.4 C), Min:95.8 F (35.4 C), Max:98.3 F (36.8 C)  Recent Labs  Lab 10/25/18 1241 10/26/18 0251  WBC 8.1  --   CREATININE 1.09 0.97    Estimated Creatinine Clearance: 120.5 mL/min (by C-G formula based on SCr of 0.97 mg/dL).    No Known Allergies  Antimicrobials this admission: Vancomycin 9/3 >>  Pip/tazo 9/3 >>  Doxycycline 9/3 >>9/3  Dose adjustments this admission: N/A  Microbiology results: 9/4 wound culture: pending 9/3 BCx: NGTD  Thank you for allowing pharmacy to be a part of this patient's care.  Benita Gutter 10/26/2018 3:04 PM

## 2018-10-26 NOTE — Anesthesia Post-op Follow-up Note (Signed)
Anesthesia QCDR form completed.        

## 2018-10-26 NOTE — Progress Notes (Signed)
[employer] - [place] [facility]   PATIENT NAME: Joseph Singh    MR#:  [id number]  DATE OF BIRTH:  [birth year]  SUBJECTIVE:  CHIEF COMPLAINT:  No chief complaint on file.  Patient was sent from podiatry clinic with osteomyelitis on the foot he was waiting for surgery when I have seen in the morning.  REVIEW OF SYSTEMS:  CONSTITUTIONAL: No fever, fatigue or weakness.  EYES: No blurred or double vision.  EARS, NOSE, AND THROAT: No tinnitus or ear pain.  RESPIRATORY: No cough, shortness of breath, wheezing or hemoptysis.  CARDIOVASCULAR: No chest pain, orthopnea, edema.  GASTROINTESTINAL: No nausea, vomiting, diarrhea or abdominal pain.  GENITOURINARY: No dysuria, hematuria.  ENDOCRINE: No polyuria, nocturia,  HEMATOLOGY: No anemia, easy bruising or bleeding SKIN: No rash or lesion. MUSCULOSKELETAL: No joint pain or arthritis.   NEUROLOGIC: No tingling, numbness, weakness.  PSYCHIATRY: No anxiety or depression.   ROS  DRUG ALLERGIES:  No Known Allergies  VITALS:  Blood pressure (!) 103/55, pulse 64, temperature 97.8 F (36.6 C), temperature source Oral, resp. rate 18, height 6\' 3"  (1.905 m), weight 133 kg, SpO2 95 %.  PHYSICAL EXAMINATION:  GENERAL:  58 y.o.-year-old patient lying in the bed with no acute distress.  EYES: Pupils equal, round, reactive to light and accommodation. No scleral icterus. Extraocular muscles intact.  HEENT: Head atraumatic, normocephalic. Oropharynx and nasopharynx clear.  NECK:  Supple, no jugular venous distention. No thyroid enlargement, no tenderness.  LUNGS: Normal breath sounds bilaterally, no wheezing, rales,rhonchi or crepitation. No use of accessory muscles of respiration.  CARDIOVASCULAR: S1, S2 normal. No murmurs, rubs, or gallops.  ABDOMEN: Soft, nontender, nondistended. Bowel sounds present. No organomegaly or mass.  EXTREMITIES: No pedal edema, cyanosis, or clubbing.  Left foot had dressing present when I  saw. NEUROLOGIC: Cranial nerves II through XII are intact. Muscle strength 5/5 in all extremities. Sensation intact. Gait not checked.  PSYCHIATRIC: The patient is alert and oriented x 3.  SKIN: No obvious rash, lesion, or ulcer.   Physical Exam LABORATORY PANEL:   CBC Recent Labs  Lab 10/25/18 1241  WBC 8.1  HGB 12.4*  HCT 38.2*  PLT 404*   ------------------------------------------------------------------------------------------------------------------  Chemistries  Recent Labs  Lab 10/25/18 1241 10/26/18 0251  NA 135  --   K 4.5  --   CL 99  --   CO2 26  --   GLUCOSE 154*  --   BUN 31*  --   CREATININE 1.09 0.97  CALCIUM 9.2  --   MG 1.8  --   AST 13*  --   ALT 17  --   ALKPHOS 110  --   BILITOT 0.4  --    ------------------------------------------------------------------------------------------------------------------  Cardiac Enzymes No results for input(s): TROPONINI in the last 168 hours. ------------------------------------------------------------------------------------------------------------------  RADIOLOGY:  Mr Foot Left Wo Contrast  Result Date: 10/26/2018 CLINICAL DATA:  Osteomyelitis. EXAM: MRI OF THE LEFT FOOT WITHOUT CONTRAST TECHNIQUE: Multiplanar, multisequence MR imaging of the left forefoot was performed. No intravenous contrast was administered. COMPARISON:  Left foot x-rays dated February 23, 2018. FINDINGS: Bones/Joint/Cartilage Prior fifth ray amputation to the base of the metatarsal. Abnormal marrow edema with corresponding decreased T1 marrow signal involving the residual base of the fifth metatarsal, the majority of the fourth metatarsal to the level of the distal shaft, the base of the third metatarsal, the cuboid, and the lateral cuneiform. Findings are consistent with osteomyelitis. There is a 1.1 x 1.2 cm abscess in the  cuboid. There is a 1.5 x 1.0 cm abscess in the base of the fourth metatarsal. Reactive osteitis in the navicular and  peripheral lateral aspect of the middle cuneiform. Prior fourth toe amputation. Faint marrow edema around the first metatarsal neck may be stress related. No fracture or dislocation. Normal alignment. No joint effusion. Ligaments First through third digit collateral ligaments are intact. Lisfranc ligament is intact. Muscles and Tendons Flexor, peroneal and extensor compartment tendons are intact. Severe fatty atrophy of the intrinsic muscles of the forefoot. Soft tissue Large sinus tract extending from the residual base of the fifth metatarsal to the lateral midfoot skin surface. Prominent surrounding soft tissue swelling. No fluid collection or hematoma. No soft tissue mass. IMPRESSION: 1. Large sinus tract extending from the residual base of the fifth metatarsal to the lateral midfoot skin surface with extensive underlying osteomyelitis involving the fifth metatarsal base, the majority of the fourth metatarsal to the level of the distal shaft, the base of the third metatarsal, the cuboid, and the lateral cuneiform. Abscesses within the cuboid and base of the fourth metatarsal. Electronically Signed   By: Titus Dubin M.D.   On: 10/26/2018 08:18    ASSESSMENT AND PLAN:   Active Problems:   Osteomyelitis of foot ([place])  58 y.o. male with past medical history of diabetes mellitus, kidney stones, hyperlipidemia, hypertension, neuropathy, nonhealing ulceration on the left foot, former smoker, PAD with recent revascularization by vascular surgery and recent diagnosis of right ear infection with bilateral effusion presenting as a direct admit from podiatry clinic with left foot osteomyelitis.  1. Left foot osteomyelitis -x-ray shows obvious erosive change consistent with osteomyelitis to the lateral left foot - Obtain blood cultures - Reviewed MRI left foot to evaluate the extent of the infection -  empiric antibiotics with vancomycin - Dr. Vickki Muff to do surgery today.  2. Right ear infection -Patient  was on doxycycline, now on Vanco and Zosyn for osteomyelitis.  3. Diabetes Mellitus Type 2 with complications - Recent ALPF7T 7.6. Goal < 7.0 - Checked hemoglobin A1c-now 8.1 - Hold metformin, Jardiance,Tradjenta, and glipizide for now - CBG monitoring - SSI - DM education and close PCP follow up  4. HTN  + Goal BP <130/80 - Continue amlodipine, lisinopril and metoprolol.  5. HLD  + Goal LDL<100 - Atorvastatin 40mg  PO qhs  6. PAD with recent revascularization per vascular - Continue aspirin and Plavix  7. DVT prophylaxis - Enoxaparin SubQ     All the records are reviewed and case discussed with Care Management/Social Workerr. Management plans discussed with the patient, family and they are in agreement.  CODE STATUS: Full  TOTAL TIME TAKING CARE OF THIS PATIENT: 35 minutes.     POSSIBLE D/C IN 1-2 DAYS, DEPENDING ON CLINICAL CONDITION.   Vaughan Basta M.D on 10/26/2018   Between 7am to 6pm - Pager - [phone]  After 6pm go to [email or web address] - password [email or web address] [facility]  Office  [phone]  CC: Primary care physician; Leone Haven, MD  Note: This dictation was prepared with Dragon dictation along with smaller phrase technology. Any transcriptional errors that result from this process are unintentional.

## 2018-10-26 NOTE — Anesthesia Preprocedure Evaluation (Signed)
Anesthesia Evaluation  Patient identified by MRN, date of birth, ID band Patient awake    Reviewed: Allergy & Precautions, NPO status , Patient's Chart, lab work & pertinent test results  History of Anesthesia Complications Negative for: history of anesthetic complications  Airway Mallampati: III       Dental   Pulmonary neg sleep apnea, neg COPD, Patient abstained from smoking.Not current smoker, former smoker,           Cardiovascular hypertension, Pt. on medications + Peripheral Vascular Disease  (-) Past MI and (-) CHF (-) dysrhythmias (-) Valvular Problems/Murmurs     Neuro/Psych    GI/Hepatic Neg liver ROS, PUD, Bowel prep,neg GERD  ,  Endo/Other  diabetes, Type 2, Oral Hypoglycemic Agents  Renal/GU negative Renal ROS     Musculoskeletal   Abdominal   Peds  Hematology   Anesthesia Other Findings   Reproductive/Obstetrics                             Anesthesia Physical Anesthesia Plan  ASA: III  Anesthesia Plan: General   Post-op Pain Management:    Induction: Intravenous  PONV Risk Score and Plan: 3 and Ondansetron, Midazolam and Treatment may vary due to age or medical condition  Airway Management Planned: Oral ETT  Additional Equipment:   Intra-op Plan:   Post-operative Plan:   Informed Consent: I have reviewed the patients History and Physical, chart, labs and discussed the procedure including the risks, benefits and alternatives for the proposed anesthesia with the patient or authorized representative who has indicated his/her understanding and acceptance.       Plan Discussed with:   Anesthesia Plan Comments:         Anesthesia Quick Evaluation

## 2018-10-26 NOTE — Progress Notes (Signed)
Date of Admission:  10/25/2018        Excision bone fifth metatarsal base left foot 2.  Excision entire fourth metatarsal left foot 3.  Excision entire cuboid left foot  Subjective: Patient back from OR. Underwent surgery today. No fever. No cough or shortness of breath No pain abdomen   Medications:  . amLODipine  10 mg Oral BH-q7a  . aspirin EC  81 mg Oral Daily  . atorvastatin  40 mg Oral q1800  . Chlorhexidine Gluconate Cloth  6 each Topical Q0600  . clopidogrel  75 mg Oral Daily  . enoxaparin (LOVENOX) injection  40 mg Subcutaneous QHS  . fluticasone  2 spray Each Nare Daily  . gabapentin  300 mg Oral QID  . hydrochlorothiazide  12.5 mg Oral Daily  . insulin aspart  0-5 Units Subcutaneous QHS  . insulin aspart  0-9 Units Subcutaneous TID WC  . lisinopril  10 mg Oral Daily  . metoprolol succinate  50 mg Oral BH-q7a  . traMADol  50 mg Oral TID    Objective: Vital signs in last 24 hours: Temp:  [95.8 F (35.4 C)-98.3 F (36.8 C)] 97.8 F (36.6 C) (09/04 1509) Pulse Rate:  [57-83] 64 (09/04 1509) Resp:  [13-19] 18 (09/04 1509) BP: (103-125)/(55-68) 103/55 (09/04 1509) SpO2:  [92 %-100 %] 95 % (09/04 1509) Weight:  [133 kg-133.2 kg] 133 kg (09/04 1107)  PHYSICAL EXAM:  General: Alert, cooperative, no distress, appears stated age.  Obese Head: Normocephalic, without obvious abnormality, atraumatic. Eyes: Conjunctivae clear, anicteric sclerae. Pupils are equal ENT Nares normal. No drainage or sinus tenderness. Lips, mucosa, and tongue normal. No Thrush Neck: Supple, symmetrical, no adenopathy, thyroid: non tender no carotid bruit and no JVD. Back: No CVA tenderness. Lungs: Clear to auscultation bilaterally. No Wheezing or Rhonchi. No rales. Heart: Regular rate and rhythm, no murmur, rub or gallop. Abdomen: Soft, non-tender,not distended. Bowel sounds normal. No masses Extremities: Left foot surgical dressing not removed skin: No rashes or lesions. Or  bruising Lymph: Cervical, supraclavicular normal. Neurologic: Grossly non-focal  Lab Results Recent Labs    10/25/18 1241 10/26/18 0251  WBC 8.1  --   HGB 12.4*  --   HCT 38.2*  --   NA 135  --   K 4.5  --   CL 99  --   CO2 26  --   BUN 31*  --   CREATININE 1.09 0.97   Liver Panel Recent Labs    10/25/18 1241  PROT 7.7  ALBUMIN 3.3*  AST 13*  ALT 17  ALKPHOS 110  BILITOT 0.4   Sedimentation Rate No results for input(s): ESRSEDRATE in the last 72 hours. C-Reactive Protein No results for input(s): CRP in the last 72 hours.  Microbiology:  Studies/Results: Mr Foot Left Wo Contrast  Result Date: 10/26/2018 CLINICAL DATA:  Osteomyelitis. EXAM: MRI OF THE LEFT FOOT WITHOUT CONTRAST TECHNIQUE: Multiplanar, multisequence MR imaging of the left forefoot was performed. No intravenous contrast was administered. COMPARISON:  Left foot x-rays dated February 23, 2018. FINDINGS: Bones/Joint/Cartilage Prior fifth ray amputation to the base of the metatarsal. Abnormal marrow edema with corresponding decreased T1 marrow signal involving the residual base of the fifth metatarsal, the majority of the fourth metatarsal to the level of the distal shaft, the base of the third metatarsal, the cuboid, and the lateral cuneiform. Findings are consistent with osteomyelitis. There is a 1.1 x 1.2 cm abscess in the cuboid. There is a 1.5 x 1.0 cm  abscess in the base of the fourth metatarsal. Reactive osteitis in the navicular and peripheral lateral aspect of the middle cuneiform. Prior fourth toe amputation. Faint marrow edema around the first metatarsal neck may be stress related. No fracture or dislocation. Normal alignment. No joint effusion. Ligaments First through third digit collateral ligaments are intact. Lisfranc ligament is intact. Muscles and Tendons Flexor, peroneal and extensor compartment tendons are intact. Severe fatty atrophy of the intrinsic muscles of the forefoot. Soft tissue Large sinus  tract extending from the residual base of the fifth metatarsal to the lateral midfoot skin surface. Prominent surrounding soft tissue swelling. No fluid collection or hematoma. No soft tissue mass. IMPRESSION: 1. Large sinus tract extending from the residual base of the fifth metatarsal to the lateral midfoot skin surface with extensive underlying osteomyelitis involving the fifth metatarsal base, the majority of the fourth metatarsal to the level of the distal shaft, the base of the third metatarsal, the cuboid, and the lateral cuneiform. Abscesses within the cuboid and base of the fourth metatarsal. Electronically Signed   By: Titus Dubin M.D.   On: 10/26/2018 08:18       Assessment/Plan: Diabetic foot infection/Left foot infection.  MRI showing large sinus tract extending in the residual base of the fifth metatarsal to the lateral midfoot skin surface with extensive underlying osteomyelitis involving the fifth metatarsal base, the majority of the fourth metatarsal to the level of the distal shaft and the base of the third metatarsal, the cuboid the lateral cuneiform.  Abscesses within the cuboid and base of the fourth metatarsal was seen.  He underwent excision of entire fourth metatarsal, Excision entire cuboid left foot and the left metatarsal base. Currently on Vanco and Zosyn. History of MRSA infection in the past the same foot and had undergone left fifth toe partial ray excision in June 2020.  Tomorrow we will de-escalate antibiotics.  Diabetes mellitus.On insulin  Hypertension on amlodipine, hydrochlorothiazide and lisinopril.  Also on metoprolol  Hyperlipidemia statin.  Peripheral arterial disease with recent vascularization.  ID will follow him peripherally this weekend call if needed.

## 2018-10-26 NOTE — Progress Notes (Signed)
Hypoglycemic Event  CBG:58  Treatment:  administered 4 ounces orange juice  Symptoms: none  Follow-up CBG: Time 2226 CBG Result 134 Possible Reasons for Event: pt was given insulin previous shift. Pt states he did not eat much dinner  Comments/MD notified:Dr Amado Coe Tapp Madilynne Mullan

## 2018-10-27 LAB — GLUCOSE, CAPILLARY
Glucose-Capillary: 136 mg/dL — ABNORMAL HIGH (ref 70–99)
Glucose-Capillary: 148 mg/dL — ABNORMAL HIGH (ref 70–99)
Glucose-Capillary: 164 mg/dL — ABNORMAL HIGH (ref 70–99)
Glucose-Capillary: 166 mg/dL — ABNORMAL HIGH (ref 70–99)
Glucose-Capillary: 294 mg/dL — ABNORMAL HIGH (ref 70–99)

## 2018-10-27 LAB — CREATININE, SERUM
Creatinine, Ser: 1.13 mg/dL (ref 0.61–1.24)
GFR calc Af Amer: >60 mL/min (ref >60)
GFR calc non Af Amer: >60 mL/min (ref >60)

## 2018-10-27 MED ORDER — GLIPIZIDE ER 10 MG PO TB24
10.0000 mg | ORAL_TABLET | Freq: Every day | ORAL | Status: DC
  Administered 2018-10-27 – 2018-10-30 (×4): 10 mg via ORAL
  Filled 2018-10-27 (×5): qty 1

## 2018-10-27 MED ORDER — METFORMIN HCL 500 MG PO TABS
500.0000 mg | ORAL_TABLET | Freq: Two times a day (BID) | ORAL | Status: DC
  Administered 2018-10-27 – 2018-10-30 (×8): 500 mg via ORAL
  Filled 2018-10-27 (×8): qty 1

## 2018-10-27 NOTE — Progress Notes (Signed)
Reported repeat CBG 294 to Dr Jannifer Franklin. Received order to continue to monitor pt.

## 2018-10-27 NOTE — Progress Notes (Signed)
BP 104/44, HR 64. Notified Dr Anselm Jungling, per Dr Anselm Jungling, hold bp meds this am.

## 2018-10-27 NOTE — Progress Notes (Signed)
PODIATRY / FOOT AND ANKLE SURGERY PROGRESS NOTE  Chief Complaint: L foot wound   HPI: Joseph Singh is a 58 y.o. male who presents s/p 1d L 4th and 5th ray amputations and cuboid partial resection with closure of wound.  Pt states that he is feeling well today overall.  He only has minimal pain to the L foot.  Pt denies n/v/f/c.  PMHx:  Past Medical History:  Diagnosis Date  . Arthritis   . Diabetes mellitus without complication ([place])   . History of kidney stones    h/o  . Hyperlipidemia   . Hypertension   . Neuropathy     Surgical Hx:  Past Surgical History:  Procedure Laterality Date  . AMPUTATION TOE Left 08/10/2018   Procedure: RAY LEFT;  Surgeon: Samara Deist, DPM;  Location: [facility];  Service: Podiatry;  Laterality: Left;  . LOWER EXTREMITY ANGIOGRAPHY Right 02/25/2016   Procedure: Lower Extremity Angiography;  Surgeon: Algernon Huxley, MD;  Location: [place] [facility];  Service: Cardiovascular;  Laterality: Right;  . LOWER EXTREMITY ANGIOGRAPHY Right 02/27/2017   Procedure: LOWER EXTREMITY ANGIOGRAPHY;  Surgeon: Algernon Huxley, MD;  Location: [place] [facility];  Service: Cardiovascular;  Laterality: Right;  . LOWER EXTREMITY ANGIOGRAPHY Left 08/06/2018   Procedure: LOWER EXTREMITY ANGIOGRAPHY;  Surgeon: Algernon Huxley, MD;  Location: [place] [facility];  Service: Cardiovascular;  Laterality: Left;  . LOWER EXTREMITY INTERVENTION  02/25/2016   Procedure: Lower Extremity Intervention;  Surgeon: Algernon Huxley, MD;  Location: [place] [facility];  Service: Cardiovascular;;  . TOE AMPUTATION      FHx:  Family History  Problem Relation Age of Onset  . Diabetes Mother   . Heart disease Father   . Diabetes Sister   . Diabetes Brother   . Cancer Sister        lung  . Cancer Sister        breast    Social History:  reports that he quit smoking about 2 months ago. His smoking use included cigarettes. He has a 17.50 pack-year smoking history. He has never used smokeless tobacco.  He reports that he does not drink alcohol or use drugs.  Allergies: No Known Allergies  Review of Systems: General ROS: negative Psychological ROS: negative ENT ROS: negative Respiratory ROS: no cough, shortness of breath, or wheezing Cardiovascular ROS: no chest pain or dyspnea on exertion Gastrointestinal ROS: no abdominal pain, change in bowel habits, or black or bloody stools Musculoskeletal ROS: positive for - joint swelling Neurological ROS: positive for - numbness/tingling Dermatological ROS: positive for L foot incision/wound  Medications Prior to Admission  Medication Sig Dispense Refill  . amLODipine (NORVASC) 10 MG tablet TAKE ONE (1) TABLET EACH DAY (Patient taking differently: Take 10 mg by mouth every morning. ) 90 tablet 3  . aspirin EC 81 MG tablet Take 1 tablet (81 mg total) by mouth daily. 150 tablet 2  . atorvastatin (LIPITOR) 40 MG tablet TAKE ONE (1) TABLET EACH DAY (Patient taking differently: Take 40 mg by mouth daily. ) 90 tablet 3  . blood glucose meter kit and supplies KIT E11.51, check 2x daily, dispense based on patient and insurance preference 1 each 0  . cephALEXin (KEFLEX) 500 MG capsule Take 1 capsule (500 mg total) by mouth 4 (four) times daily. 40 capsule 0  . clopidogrel (PLAVIX) 75 MG tablet TAKE ONE (1) TABLET EACH DAY (Patient taking differently: Take 75 mg by mouth daily. ) 90  tablet 3  . doxycycline (VIBRA-TABS) 100 MG tablet Take 1 tablet (100 mg total) by mouth 2 (two) times daily. 20 tablet 0  . empagliflozin (JARDIANCE) 25 MG TABS tablet Take 25 mg by mouth daily. 180 tablet 1  . fluticasone (FLONASE) 50 MCG/ACT nasal spray Place 2 sprays into both nostrils daily. 16 g 2  . gabapentin (NEURONTIN) 300 MG capsule Take 1 capsule (300 mg total) by mouth 4 (four) times daily. 360 capsule 2  . glipiZIDE (GLUCOTROL XL) 10 MG 24 hr tablet TAKE ONE (1) TABLET EACH DAY WITH BREAKFAST (Patient taking differently: Take 10 mg by mouth daily with breakfast. )  90 tablet 3  . glucose blood (ONE TOUCH ULTRA TEST) test strip Check blood sugar twice daily Dx code: E11.9 200 each 0  . HYDROcodone-acetaminophen (NORCO) 5-325 MG tablet Take 1 tablet by mouth every 6 (six) hours as needed for moderate pain. 30 tablet 0  . lisinopril-hydrochlorothiazide (PRINZIDE,ZESTORETIC) 20-25 MG tablet TAKE 1 TABLET BY MOUTH EVERY DAY (Patient taking differently: Take 1 tablet by mouth daily. ) 90 tablet 3  . metFORMIN (GLUCOPHAGE) 1000 MG tablet TAKE 1 TABLET BY MOUTH TWICE DAILY WITH A MEAL 180 tablet 3  . metoprolol succinate (TOPROL-XL) 50 MG 24 hr tablet Take 1 tablet (50 mg total) by mouth daily. (Patient taking differently: Take 50 mg by mouth every morning. ) 90 tablet 1  . TRADJENTA 5 MG TABS tablet TAKE 1 TABLET BY MOUTH EVERY DAY (Patient taking differently: Take 5 mg by mouth daily. ) 30 tablet 3  . traMADol (ULTRAM) 50 MG tablet TAKE 1 TABLET BY MOUTH THREE TIMES A DAY (Patient taking differently: Take 50 mg by mouth 3 (three) times daily. ) 90 tablet 0  . cyclobenzaprine (FLEXERIL) 10 MG tablet TAKE ONE TABLET BY MOUTH 3 TIMES DAILY AS NEEDED FOR MUSCLE SPASMS. (Patient not taking: No sig reported) 30 tablet 1  . mupirocin ointment (BACTROBAN) 2 % Place thin layer of ointment over red area of skin on left leg 2 times per day for 7 days. (Patient not taking: Reported on 10/25/2018) 22 g 0  . Vitamin D, Ergocalciferol, (DRISDOL) 50000 units CAPS capsule Take 1 capsule (50,000 Units total) by mouth every 7 (seven) days. 8 capsule 0    Physical Exam: General: Alert and oriented.  No apparent distress.  Vascular: DP/PT pulses palpable bil, CFT intact to digits and feet bil, no hair growth bil.  Neuro: Light touch sensation reduced  BLE  Derm: Incision line to the L lateral foot intact with sutures intact, serousanginous drainage present today, minimal erythema and edema present.  MSK: Mild POP to L foot and ankle, L 4th and 5th ray amputations, L partial cuboid  resection.   Results for orders placed or performed during the hospital encounter of 10/25/18 (from the past 48 hour(s))  SARS Coronavirus 2 [facility] order, Performed in [facility] hospital lab)     Status: None   Collection Time: 10/25/18  2:50 PM  Result Value Ref Range   SARS Coronavirus 2 NEGATIVE NEGATIVE    Comment: (NOTE) If result is NEGATIVE SARS-CoV-2 target nucleic acids are NOT DETECTED. The SARS-CoV-2 RNA is generally detectable in upper and lower  respiratory specimens during the acute phase of infection. The lowest  concentration of SARS-CoV-2 viral copies this assay can detect is 250  copies / mL. A negative result does not preclude SARS-CoV-2 infection  and should not be used as the sole basis for treatment or  other  patient management decisions.  A negative result may occur with  improper specimen collection / handling, submission of specimen other  than nasopharyngeal swab, presence of viral mutation(s) within the  areas targeted by this assay, and inadequate number of viral copies  (<250 copies / mL). A negative result must be combined with clinical  observations, patient history, and epidemiological information. If result is POSITIVE SARS-CoV-2 target nucleic acids are DETECTED. The SARS-CoV-2 RNA is generally detectable in upper and lower  respiratory specimens dur ing the acute phase of infection.  Positive  results are indicative of active infection with SARS-CoV-2.  Clinical  correlation with patient history and other diagnostic information is  necessary to determine patient infection status.  Positive results do  not rule out bacterial infection or co-infection with other viruses. If result is PRESUMPTIVE POSTIVE SARS-CoV-2 nucleic acids MAY BE PRESENT.   A presumptive positive result was obtained on the submitted specimen  and confirmed on repeat testing.  While 2019 novel coronavirus  (SARS-CoV-2) nucleic acids may be present in the submitted sample   additional confirmatory testing may be necessary for epidemiological  and / or clinical management purposes  to differentiate between  SARS-CoV-2 and other Sarbecovirus currently known to infect humans.  If clinically indicated additional testing with an alternate test  methodology [phone]) is advised. The SARS-CoV-2 RNA is generally  detectable in upper and lower respiratory sp ecimens during the acute  phase of infection. The expected result is Negative. Fact Sheet for Patients:  StrictlyIdeas.no Fact Sheet for Healthcare Providers: BankingDealers.co.za This test is not yet approved or cleared by the [place] FDA and has been authorized for detection and/or diagnosis of SARS-CoV-2 by FDA under an Emergency Use Authorization (EUA).  This EUA will remain in effect (meaning this test can be used) for the duration of the COVID-19 declaration under Section 564(b)(1) of the Act, 21 U.S.C. section 360bbb-3(b)(1), unless the authorization is terminated or revoked sooner. Performed at [facility], [address] [place], [place] [address]   Glucose, capillary     Status: Abnormal   Collection Time: 10/25/18  5:30 PM  Result Value Ref Range   Glucose-Capillary 125 (H) 70 - 99 mg/dL   Comment 1 Notify RN   Glucose, capillary     Status: Abnormal   Collection Time: 10/25/18 10:01 PM  Result Value Ref Range   Glucose-Capillary 58 (L) 70 - 99 mg/dL  Glucose, capillary     Status: Abnormal   Collection Time: 10/25/18 10:26 PM  Result Value Ref Range   Glucose-Capillary 134 (H) 70 - 99 mg/dL  Surgical pcr screen     Status: None   Collection Time: 10/25/18 10:44 PM   Specimen: Nasal Mucosa; Nasal Swab  Result Value Ref Range   MRSA, PCR NEGATIVE NEGATIVE   Staphylococcus aureus NEGATIVE NEGATIVE    Comment: (NOTE) The Xpert SA Assay (FDA approved for NASAL specimens in patients 38 years of age and older), is one component  of a comprehensive surveillance program. It is not intended to diagnose infection nor to guide or monitor treatment. Performed at [facility], [address] [place], [place] [address]   Creatinine, serum     Status: None   Collection Time: 10/26/18  2:51 AM  Result Value Ref Range   Creatinine, Ser 0.97 0.61 - 1.24 mg/dL   GFR calc non Af Amer >60 >60 mL/min   GFR calc Af Amer >60 >60 mL/min    Comment:  Performed at [facility], [address] [place], [place] [address]  Glucose, capillary     Status: None   Collection Time: 10/26/18  5:55 AM  Result Value Ref Range   Glucose-Capillary 88 70 - 99 mg/dL  Glucose, capillary     Status: None   Collection Time: 10/26/18  9:21 AM  Result Value Ref Range   Glucose-Capillary 91 70 - 99 mg/dL  Aerobic/Anaerobic Culture (surgical/deep wound)     Status: None (Preliminary result)   Collection Time: 10/26/18  1:04 PM   Specimen: Bone; Tissue  Result Value Ref Range   Specimen Description BONE LEFT FOOT    Special Requests NONE    Gram Stain      RARE WBC PRESENT, PREDOMINANTLY PMN NO ORGANISMS SEEN Performed at [facility], [place] [address] [place], [place] [address]    Culture PENDING    Report Status PENDING   Glucose, capillary     Status: Abnormal   Collection Time: 10/26/18  1:52 PM  Result Value Ref Range   Glucose-Capillary 124 (H) 70 - 99 mg/dL  Glucose, capillary     Status: Abnormal   Collection Time: 10/26/18  4:54 PM  Result Value Ref Range   Glucose-Capillary 222 (H) 70 - 99 mg/dL  Glucose, capillary     Status: Abnormal   Collection Time: 10/26/18  8:51 PM  Result Value Ref Range   Glucose-Capillary 441 (H) 70 - 99 mg/dL  Glucose, random     Status: Abnormal   Collection Time: 10/26/18  9:09 PM  Result Value Ref Range   Glucose, Bld 431 (H) 70 - 99 mg/dL    Comment: Performed at [facility], [address] [place], [place] [address]  Glucose, capillary     Status:  Abnormal   Collection Time: 10/27/18 12:52 AM  Result Value Ref Range   Glucose-Capillary 294 (H) 70 - 99 mg/dL   Comment 1 Notify RN   Creatinine, serum     Status: None   Collection Time: 10/27/18  3:52 AM  Result Value Ref Range   Creatinine, Ser 1.13 0.61 - 1.24 mg/dL   GFR calc non Af Amer >60 >60 mL/min   GFR calc Af Amer >60 >60 mL/min    Comment: Performed at [facility], [address] [place],  [address]  Glucose, capillary     Status: Abnormal   Collection Time: 10/27/18  7:38 AM  Result Value Ref Range   Glucose-Capillary 166 (H) 70 - 99 mg/dL   Comment 1 Notify RN   Glucose, capillary     Status: Abnormal   Collection Time: 10/27/18 11:31 AM  Result Value Ref Range   Glucose-Capillary 164 (H) 70 - 99 mg/dL   Comment 1 Notify RN    Mr Foot Left Wo Contrast  Result Date: 10/26/2018 CLINICAL DATA:  Osteomyelitis. EXAM: MRI OF THE LEFT FOOT WITHOUT CONTRAST TECHNIQUE: Multiplanar, multisequence MR imaging of the left forefoot was performed. No intravenous contrast was administered. COMPARISON:  Left foot x-rays dated February 23, 2018. FINDINGS: Bones/Joint/Cartilage Prior fifth ray amputation to the base of the metatarsal. Abnormal marrow edema with corresponding decreased T1 marrow signal involving the residual base of the fifth metatarsal, the majority of the fourth metatarsal to the level of the distal shaft, the base of the third metatarsal, the cuboid, and the lateral cuneiform. Findings are consistent with osteomyelitis. There is a 1.1 x 1.2 cm abscess in the cuboid. There is a  1.5 x 1.0 cm abscess in the base of the fourth metatarsal. Reactive osteitis in the navicular and peripheral lateral aspect of the middle cuneiform. Prior fourth toe amputation. Faint marrow edema around the first metatarsal neck may be stress related. No fracture or dislocation. Normal alignment. No joint effusion. Ligaments First through third digit collateral ligaments are intact.  Lisfranc ligament is intact. Muscles and Tendons Flexor, peroneal and extensor compartment tendons are intact. Severe fatty atrophy of the intrinsic muscles of the forefoot. Soft tissue Large sinus tract extending from the residual base of the fifth metatarsal to the lateral midfoot skin surface. Prominent surrounding soft tissue swelling. No fluid collection or hematoma. No soft tissue mass. IMPRESSION: 1. Large sinus tract extending from the residual base of the fifth metatarsal to the lateral midfoot skin surface with extensive underlying osteomyelitis involving the fifth metatarsal base, the majority of the fourth metatarsal to the level of the distal shaft, the base of the third metatarsal, the cuboid, and the lateral cuneiform. Abscesses within the cuboid and base of the fourth metatarsal. Electronically Signed   By: Titus Dubin M.D.   On: 10/26/2018 08:18    Blood pressure (!) 117/59, pulse 73, temperature 97.7 F (36.5 C), temperature source Oral, resp. rate 16, height '6\' 3"'$  (1.905 m), weight 133 kg, SpO2 97 %.  Assessment 1. Cellulitis L foot 2. Osteomyelitis L forefoot/midfoot s/p partial foot amputation with closure by Dr. Vickki Muff on 10/26/18 3. DM2 with polyneuropathy  Plan -Examined L foot -Incision line intact with sutures intact. Minimal drainage and decreased erythema present.  Drain was removed previously. -Redressed with xeroform, 4x4, abd, kerlix, ace wrap. -Continue to monitor bone and wound cultures.  Bone culture NGTD, path pending. -Appreciate recs for Abx.  Appreciate ID recs.  Monitor cultures to decide on Abx path IV vs. Oral for discharge. -Continue NWB to L foot at all times. -Leave dressings CDI. -Elevate L foot on 2 pillows when at rest or in chair.  Caroline More 10/27/2018, 1:42 PM

## 2018-10-27 NOTE — Progress Notes (Signed)
Physical Therapy Evaluation Patient Details Name: Joseph Singh MRN: [id number] DOB: 1960/10/25 Today's Date: 10/27/2018   History of Present Illness  58 y.o. male with past medical history of diabetes mellitus, kidney stones, hyperlipidemia, hypertension, neuropathy, nonhealing ulceration on the left foot, former smoker, PAD with recent revascularization by vascular surgery and recent diagnosis of right ear infection with bilateral effusion presenting as a direct admit from podiatry clinic with left foot osteomyelitis.  Pt is s/p I&D of L foot  Clinical Impression  Patient performs transfers with crutches with SBA, knee roller with SBA NWB LLE. Patient ambulates with crutches 10 feet LLE NWB SBA , knee roller 300 feet SBA, L NWB. Patient is independent with bed mobility and SBA for transfers . He will continue to benefit from skilled PT to progress to ascending and descending steps.    Follow Up Recommendations Home health PT    Equipment Recommendations  (scooter and crutches)    Recommendations for Other Services       Precautions / Restrictions Restrictions Weight Bearing Restrictions: Yes LLE Weight Bearing: Non weight bearing      Mobility  Bed Mobility Overal bed mobility: Independent                Transfers Overall transfer level: Modified independent Equipment used: (knee scooter)             General transfer comment: Patient was able to keep his weight off his L foot. Also tried crutches with NWB L foot.  Ambulation/Gait Ambulation/Gait assistance: Supervision Gait Distance (Feet): 10 Feet Assistive device: Crutches Gait Pattern/deviations: (also used the knee scooter for 300 feet, SBA)        Stairs            Wheelchair Mobility    Modified Rankin (Stroke Patients Only)       Balance Overall balance assessment: Modified Independent                                           Pertinent Vitals/Pain Pain  Assessment: No/denies pain Pain Location: lateral L foot    Home Living Family/patient expects to be discharged to:: Private residence Living Arrangements: Spouse/significant other;Children Available Help at Discharge: Family;Available 24 hours/day Type of Home: House Home Access: Stairs to enter Entrance Stairs-Rails: Can reach both Entrance Stairs-Number of Steps: 7 Home Layout: One level        Prior Function Level of Independence: Independent         Comments: Pt has been able to manage in home mobility, less active recently     Hand Dominance        Extremity/Trunk Assessment   Upper Extremity Assessment Upper Extremity Assessment: Overall WFL for tasks assessed    Lower Extremity Assessment Lower Extremity Assessment: Generalized weakness       Communication   Communication: No difficulties  Cognition Arousal/Alertness: Awake/alert Behavior During Therapy: WFL for tasks assessed/performed Overall Cognitive Status: Within Functional Limits for tasks assessed                                        General Comments      Exercises     Assessment/Plan    PT Assessment Patient needs continued PT services  PT Problem List Decreased strength;Decreased range of  motion;Decreased activity tolerance;Decreased balance;Decreased mobility;Decreased coordination;Decreased knowledge of use of DME;Decreased safety awareness;Decreased knowledge of precautions       PT Treatment Interventions      PT Goals (Current goals can be found in the Care Plan section)  Acute Rehab PT Goals Patient Stated Goal: go home PT Goal Formulation: With patient Time For Goal Achievement: 11/10/18 Potential to Achieve Goals: Good    Frequency Min 2X/week   Barriers to discharge        Co-evaluation               AM-PAC PT "6 Clicks" Mobility  Outcome Measure Help needed turning from your back to your side while in a flat bed without using bedrails?:  None Help needed moving from lying on your back to sitting on the side of a flat bed without using bedrails?: None Help needed moving to and from a bed to a chair (including a wheelchair)?: A Little Help needed standing up from a chair using your arms (e.g., wheelchair or bedside chair)?: A Little Help needed to walk in hospital room?: A Little Help needed climbing 3-5 steps with a railing? : None 6 Click Score: 21    End of Session Equipment Utilized During Treatment: Gait belt Activity Tolerance: Patient limited by fatigue Patient left: in bed;with bed alarm set   PT Visit Diagnosis: Muscle weakness (generalized) (M62.81);Difficulty in walking, not elsewhere classified (R26.2)    Time: 1245-1315 PT Time Calculation (min) (ACUTE ONLY): 30 min   Charges:   PT Evaluation $PT Eval Low Complexity: 1 Low PT Treatments $Gait Training: 8-22 mins          Ezekiel InaMansfield, Myrical Andujo S, PT DPT 10/27/2018, 2:48 PM

## 2018-10-27 NOTE — Progress Notes (Signed)
[facility] [place] [facility]   PATIENT NAME: Joseph Singh    MR#:  [id number]  DATE OF BIRTH:  [birth year]  SUBJECTIVE:  CHIEF COMPLAINT:  No chief complaint on file.  Patient was sent from podiatry clinic with osteomyelitis on the foot , status post resection of fourth metatarsal bone and cuboid bone.  Feels better today.  REVIEW OF SYSTEMS:  CONSTITUTIONAL: No fever, fatigue or weakness.  EYES: No blurred or double vision.  EARS, NOSE, AND THROAT: No tinnitus or ear pain.  RESPIRATORY: No cough, shortness of breath, wheezing or hemoptysis.  CARDIOVASCULAR: No chest pain, orthopnea, edema.  GASTROINTESTINAL: No nausea, vomiting, diarrhea or abdominal pain.  GENITOURINARY: No dysuria, hematuria.  ENDOCRINE: No polyuria, nocturia,  HEMATOLOGY: No anemia, easy bruising or bleeding SKIN: No rash or lesion. MUSCULOSKELETAL: No joint pain or arthritis.   NEUROLOGIC: No tingling, numbness, weakness.  PSYCHIATRY: No anxiety or depression.   ROS  DRUG ALLERGIES:  No Known Allergies  VITALS:  Blood pressure (!) 117/59, pulse 73, temperature 97.7 F (36.5 C), temperature source Oral, resp. rate 16, height 6\' 3"  (1.905 m), weight 133 kg, SpO2 97 %.  PHYSICAL EXAMINATION:  GENERAL:  58 y.o.-year-old patient lying in the bed with no acute distress.  EYES: Pupils equal, round, reactive to light and accommodation. No scleral icterus. Extraocular muscles intact.  HEENT: Head atraumatic, normocephalic. Oropharynx and nasopharynx clear.  NECK:  Supple, no jugular venous distention. No thyroid enlargement, no tenderness.  LUNGS: Normal breath sounds bilaterally, no wheezing, rales,rhonchi or crepitation. No use of accessory muscles of respiration.  CARDIOVASCULAR: S1, S2 normal. No murmurs, rubs, or gallops.  ABDOMEN: Soft, nontender, nondistended. Bowel sounds present. No organomegaly or mass.  EXTREMITIES: No pedal edema, cyanosis, or clubbing.  Left foot had  dressing present when I saw. NEUROLOGIC: Cranial nerves II through XII are intact. Muscle strength 5/5 in all extremities. Sensation intact. Gait not checked.  PSYCHIATRIC: The patient is alert and oriented x 3.  SKIN: No obvious rash, lesion, or ulcer.   Physical Exam LABORATORY PANEL:   CBC Recent Labs  Lab 10/25/18 1241  WBC 8.1  HGB 12.4*  HCT 38.2*  PLT 404*   ------------------------------------------------------------------------------------------------------------------  Chemistries  Recent Labs  Lab 10/25/18 1241  10/26/18 2109 10/27/18 0352  NA 135  --   --   --   K 4.5  --   --   --   CL 99  --   --   --   CO2 26  --   --   --   GLUCOSE 154*  --  431*  --   BUN 31*  --   --   --   CREATININE 1.09   < >  --  1.13  CALCIUM 9.2  --   --   --   MG 1.8  --   --   --   AST 13*  --   --   --   ALT 17  --   --   --   ALKPHOS 110  --   --   --   BILITOT 0.4  --   --   --    < > = values in this interval not displayed.   ------------------------------------------------------------------------------------------------------------------  Cardiac Enzymes No results for input(s): TROPONINI in the last 168 hours. ------------------------------------------------------------------------------------------------------------------  RADIOLOGY:  Mr Foot Left Wo Contrast  Result Date: 10/26/2018 CLINICAL DATA:  Osteomyelitis. EXAM: MRI OF THE LEFT FOOT WITHOUT CONTRAST  TECHNIQUE: Multiplanar, multisequence MR imaging of the left forefoot was performed. No intravenous contrast was administered. COMPARISON:  Left foot x-rays dated February 23, 2018. FINDINGS: Bones/Joint/Cartilage Prior fifth ray amputation to the base of the metatarsal. Abnormal marrow edema with corresponding decreased T1 marrow signal involving the residual base of the fifth metatarsal, the majority of the fourth metatarsal to the level of the distal shaft, the base of the third metatarsal, the cuboid, and the  lateral cuneiform. Findings are consistent with osteomyelitis. There is a 1.1 x 1.2 cm abscess in the cuboid. There is a 1.5 x 1.0 cm abscess in the base of the fourth metatarsal. Reactive osteitis in the navicular and peripheral lateral aspect of the middle cuneiform. Prior fourth toe amputation. Faint marrow edema around the first metatarsal neck may be stress related. No fracture or dislocation. Normal alignment. No joint effusion. Ligaments First through third digit collateral ligaments are intact. Lisfranc ligament is intact. Muscles and Tendons Flexor, peroneal and extensor compartment tendons are intact. Severe fatty atrophy of the intrinsic muscles of the forefoot. Soft tissue Large sinus tract extending from the residual base of the fifth metatarsal to the lateral midfoot skin surface. Prominent surrounding soft tissue swelling. No fluid collection or hematoma. No soft tissue mass. IMPRESSION: 1. Large sinus tract extending from the residual base of the fifth metatarsal to the lateral midfoot skin surface with extensive underlying osteomyelitis involving the fifth metatarsal base, the majority of the fourth metatarsal to the level of the distal shaft, the base of the third metatarsal, the cuboid, and the lateral cuneiform. Abscesses within the cuboid and base of the fourth metatarsal. Electronically Signed   By: Titus Dubin M.D.   On: 10/26/2018 08:18    ASSESSMENT AND PLAN:   Active Problems:   Osteomyelitis of foot ([place])  58 y.o. male with past medical history of diabetes mellitus, kidney stones, hyperlipidemia, hypertension, neuropathy, nonhealing ulceration on the left foot, former smoker, PAD with recent revascularization by vascular surgery and recent diagnosis of right ear infection with bilateral effusion presenting as a direct admit from podiatry clinic with left foot osteomyelitis.  1. Left foot osteomyelitis -x-ray shows obvious erosive change consistent with osteomyelitis to the  lateral left foot - Negative blood cultures - Reviewed MRI left foot to evaluate the extent of the infection -  empiric antibiotics with vancomycin - Dr. Vickki Muff had done amputation of fourth metatarsal bone and cuboid bone on left. Appreciate help by ID, able to de-escalate antibiotics soon.  2. Right ear infection -Patient was on doxycycline, now on Vanco and Zosyn for osteomyelitis.   3. Diabetes Mellitus Type 2 with complications - Recent [id number] 7.6. Goal < 7.0 - Checked hemoglobin A1c-now 8.1 - Hold metformin, Jardiance,Tradjenta, and glipizide for now - CBG monitoring - SSI - DM education and close PCP follow up Resume his glipizide and metformin now.  4. HTN  + Goal BP <130/80 - Continue amlodipine, lisinopril and metoprolol. Blood pressure has been borderline soft. Stopped amlodipine and held lisinopril and metoprolol today  5. HLD  + Goal LDL<100 - Atorvastatin 40mg  PO qhs  6. PAD with recent revascularization per vascular - Continue aspirin and Plavix  7. DVT prophylaxis - Enoxaparin SubQ     All the records are reviewed and case discussed with Care Management/Social Workerr. Management plans discussed with the patient, family and they are in agreement.  CODE STATUS: Full  TOTAL TIME TAKING CARE OF THIS PATIENT: 35 minutes.    POSSIBLE D/C  IN 1-2 DAYS, DEPENDING ON CLINICAL CONDITION.   Altamese Dilling M.D on 10/27/2018   Between 7am to 6pm - Pager - [phone]  After 6pm go to [email or web address] - Social research officer, government  [facility]  Office  [phone]  CC: Primary care physician; Glori Luis, MD  Note: This dictation was prepared with Dragon dictation along with smaller phrase technology. Any transcriptional errors that result from this process are unintentional.

## 2018-10-28 ENCOUNTER — Encounter: Payer: Self-pay | Admitting: Podiatry

## 2018-10-28 LAB — BASIC METABOLIC PANEL
Anion gap: 8 (ref 5–15)
BUN: 26 mg/dL — ABNORMAL HIGH (ref 6–20)
CO2: 27 mmol/L (ref 22–32)
Calcium: 8.8 mg/dL — ABNORMAL LOW (ref 8.9–10.3)
Chloride: 102 mmol/L (ref 98–111)
Creatinine, Ser: 0.87 mg/dL (ref 0.61–1.24)
GFR calc Af Amer: >60 mL/min (ref >60)
GFR calc non Af Amer: >60 mL/min (ref >60)
Glucose, Bld: 103 mg/dL — ABNORMAL HIGH (ref 70–99)
Potassium: 4 mmol/L (ref 3.5–5.1)
Sodium: 137 mmol/L (ref 135–145)

## 2018-10-28 LAB — CBC
HCT: 33 % — ABNORMAL LOW (ref 39.0–52.0)
Hemoglobin: 10.7 g/dL — ABNORMAL LOW (ref 13.0–17.0)
MCH: 28.9 pg (ref 26.0–34.0)
MCHC: 32.4 g/dL (ref 30.0–36.0)
MCV: 89.2 fL (ref 80.0–100.0)
Platelets: 321 10*3/uL (ref 150–400)
RBC: 3.7 MIL/uL — ABNORMAL LOW (ref 4.22–5.81)
RDW: 13.1 % (ref 11.5–15.5)
WBC: 8.3 10*3/uL (ref 4.0–10.5)
nRBC: 0 % (ref 0.0–0.2)

## 2018-10-28 LAB — VANCOMYCIN, PEAK: Vancomycin Pk: 30 ug/mL (ref 30–40)

## 2018-10-28 LAB — GLUCOSE, CAPILLARY
Glucose-Capillary: 80 mg/dL (ref 70–99)
Glucose-Capillary: 89 mg/dL (ref 70–99)
Glucose-Capillary: 132 mg/dL — ABNORMAL HIGH (ref 70–99)
Glucose-Capillary: 164 mg/dL — ABNORMAL HIGH (ref 70–99)

## 2018-10-28 MED ORDER — METRONIDAZOLE IN NACL 5-0.79 MG/ML-% IV SOLN
500.0000 mg | Freq: Three times a day (TID) | INTRAVENOUS | Status: DC
  Administered 2018-10-28 – 2018-10-29 (×3): 500 mg via INTRAVENOUS
  Filled 2018-10-28 (×5): qty 100

## 2018-10-28 MED ORDER — SODIUM CHLORIDE 0.9 % IV SOLN
2.0000 g | INTRAVENOUS | Status: DC
  Administered 2018-10-28: 14:00:00 2 g via INTRAVENOUS
  Filled 2018-10-28: qty 2
  Filled 2018-10-28: qty 20

## 2018-10-28 NOTE — Progress Notes (Signed)
Notified MD of patient's BP and HR, Dr Anselm Jungling states to hold am BP meds and HCTZ.

## 2018-10-28 NOTE — Progress Notes (Signed)
[facility] [place] [facility]   PATIENT NAME: Joseph Singh    MR#:  [id number]  DATE OF BIRTH:  07/05/1960  SUBJECTIVE:  CHIEF COMPLAINT:  No chief complaint on file.  Patient was sent from podiatry clinic with osteomyelitis on the foot , status post resection of fourth metatarsal bone and cuboid bone.  Feels better today.  REVIEW OF SYSTEMS:  CONSTITUTIONAL: No fever, fatigue or weakness.  EYES: No blurred or double vision.  EARS, NOSE, AND THROAT: No tinnitus or ear pain.  RESPIRATORY: No cough, shortness of breath, wheezing or hemoptysis.  CARDIOVASCULAR: No chest pain, orthopnea, edema.  GASTROINTESTINAL: No nausea, vomiting, diarrhea or abdominal pain.  GENITOURINARY: No dysuria, hematuria.  ENDOCRINE: No polyuria, nocturia,  HEMATOLOGY: No anemia, easy bruising or bleeding SKIN: No rash or lesion. MUSCULOSKELETAL: No joint pain or arthritis.   NEUROLOGIC: No tingling, numbness, weakness.  PSYCHIATRY: No anxiety or depression.   ROS  DRUG ALLERGIES:  No Known Allergies  VITALS:  Blood pressure (!) 105/58, pulse 73, temperature 98.5 F (36.9 C), temperature source Oral, resp. rate 17, height 6\' 3"  (1.905 m), weight 133 kg, SpO2 99 %.  PHYSICAL EXAMINATION:  GENERAL:  58 y.o.-year-old patient lying in the bed with no acute distress.  EYES: Pupils equal, round, reactive to light and accommodation. No scleral icterus. Extraocular muscles intact.  HEENT: Head atraumatic, normocephalic. Oropharynx and nasopharynx clear.  NECK:  Supple, no jugular venous distention. No thyroid enlargement, no tenderness.  LUNGS: Normal breath sounds bilaterally, no wheezing, rales,rhonchi or crepitation. No use of accessory muscles of respiration.  CARDIOVASCULAR: S1, S2 normal. No murmurs, rubs, or gallops.  ABDOMEN: Soft, nontender, nondistended. Bowel sounds present. No organomegaly or mass.  EXTREMITIES: No pedal edema, cyanosis, or clubbing.  Left foot had  dressing present when I saw. NEUROLOGIC: Cranial nerves II through XII are intact. Muscle strength 5/5 in all extremities. Sensation intact. Gait not checked.  PSYCHIATRIC: The patient is alert and oriented x 3.  SKIN: No obvious rash, lesion, or ulcer.   Physical Exam LABORATORY PANEL:   CBC Recent Labs  Lab 10/28/18 0409  WBC 8.3  HGB 10.7*  HCT 33.0*  PLT 321   ------------------------------------------------------------------------------------------------------------------  Chemistries  Recent Labs  Lab 10/25/18 1241  10/28/18 0409  NA 135  --  137  K 4.5  --  4.0  CL 99  --  102  CO2 26  --  27  GLUCOSE 154*   < > 103*  BUN 31*  --  26*  CREATININE 1.09   < > 0.87  CALCIUM 9.2  --  8.8*  MG 1.8  --   --   AST 13*  --   --   ALT 17  --   --   ALKPHOS 110  --   --   BILITOT 0.4  --   --    < > = values in this interval not displayed.   ------------------------------------------------------------------------------------------------------------------  Cardiac Enzymes No results for input(s): TROPONINI in the last 168 hours. ------------------------------------------------------------------------------------------------------------------  RADIOLOGY:  No results found.  ASSESSMENT AND PLAN:   Active Problems:   Osteomyelitis of foot [name]  58 y.o. male with past medical history of diabetes mellitus, kidney stones, hyperlipidemia, hypertension, neuropathy, nonhealing ulceration on the left foot, former smoker, PAD with recent revascularization by vascular surgery and recent diagnosis of right ear infection with bilateral effusion presenting as a direct admit from podiatry clinic with left foot osteomyelitis.  1. Left  foot osteomyelitis -x-ray shows obvious erosive change consistent with osteomyelitis to the lateral left foot - Negative blood cultures - Reviewed MRI left foot to evaluate the extent of the infection -  empiric antibiotics with vancomycin - Dr.  Vickki Muff had done amputation of fourth metatarsal bone and cuboid bone on left. Appreciate help by ID, able to de-escalate antibiotics soon. - Waiting for Cx report from Bone to decide on Abx.  2. Right ear infection -Patient was on doxycycline, now on Vanco and Zosyn for osteomyelitis.   3. Diabetes Mellitus Type 2 with complications - Recent [id number] 7.6. Goal < 7.0 - Checked hemoglobin A1c-now 8.1 - Hold metformin, Jardiance,Tradjenta, and glipizide for now - CBG monitoring - SSI - DM education and close PCP follow up Resume his glipizide and metformin now.  4. HTN  + Goal BP <130/80 - Continue amlodipine, lisinopril and metoprolol. Blood pressure has been borderline soft. Stopped amlodipine and held lisinopril and metoprolol today\ Pt was eating lot of salt at home, here in hospital BP is normal without any meds- advised that he may not need any BP meds if follow dietary advise.  5. HLD  + Goal LDL<100 - Atorvastatin 40mg  PO qhs  6. PAD with recent revascularization per vascular - Continue aspirin and Plavix  7. DVT prophylaxis - Enoxaparin SubQ     All the records are reviewed and case discussed with Care Management/Social Workerr. Management plans discussed with the patient, family and they are in agreement.  CODE STATUS: Full  TOTAL TIME TAKING CARE OF THIS PATIENT: 35 minutes.    POSSIBLE D/C IN 1-2 DAYS, DEPENDING ON CLINICAL CONDITION.   Vaughan Basta M.D on 10/28/2018   Between 7am to 6pm - Pager - [phone]  After 6pm go to [email or web address] - password [email or web address]  Office  [phone]  CC: Primary care physician; Leone Haven, MD  Note: This dictation was prepared with Dragon dictation along with smaller phrase technology. Any transcriptional errors that result from this process are unintentional.

## 2018-10-28 NOTE — Progress Notes (Addendum)
PODIATRY / FOOT AND ANKLE SURGERY PROGRESS NOTE  Chief Complaint: L foot wound   HPI: Joseph Singh is a 58 y.o. male who presents s/p 2d L 4th and 5th ray amputations and cuboid partial resection with closure of wound.  Pt states that he is feeling well today overall.  He only has minimal pain to the L foot.  Pt denies n/v/f/c.  PMHx:  Past Medical History:  Diagnosis Date  . Arthritis   . Diabetes mellitus without complication ([place])   . History of kidney stones    h/o  . Hyperlipidemia   . Hypertension   . Neuropathy     Surgical Hx:  Past Surgical History:  Procedure Laterality Date  . AMPUTATION TOE Left 08/10/2018   Procedure: RAY LEFT;  Surgeon: Samara Deist, DPM;  Location: [facility];  Service: Podiatry;  Laterality: Left;  . LOWER EXTREMITY ANGIOGRAPHY Right 02/25/2016   Procedure: Lower Extremity Angiography;  Surgeon: Algernon Huxley, MD;  Location: [facility];  Service: Cardiovascular;  Laterality: Right;  . LOWER EXTREMITY ANGIOGRAPHY Right 02/27/2017   Procedure: LOWER EXTREMITY ANGIOGRAPHY;  Surgeon: Algernon Huxley, MD;  Location: [facility];  Service: Cardiovascular;  Laterality: Right;  . LOWER EXTREMITY ANGIOGRAPHY Left 08/06/2018   Procedure: LOWER EXTREMITY ANGIOGRAPHY;  Surgeon: Algernon Huxley, MD;  Location: [facility];  Service: Cardiovascular;  Laterality: Left;  . LOWER EXTREMITY INTERVENTION  02/25/2016   Procedure: Lower Extremity Intervention;  Surgeon: Algernon Huxley, MD;  Location: [facility];  Service: Cardiovascular;;  . TOE AMPUTATION      FHx:  Family History  Problem Relation Age of Onset  . Diabetes Mother   . Heart disease Father   . Diabetes Sister   . Diabetes Brother   . Cancer Sister        lung  . Cancer Sister        breast    Social History:  reports that he quit smoking about 2 months ago. His smoking use included cigarettes. He has a 17.50 pack-year smoking history. He has never used smokeless tobacco.  He reports that he does not drink alcohol or use drugs.  Allergies: No Known Allergies  Review of Systems: General ROS: negative Psychological ROS: negative ENT ROS: negative Respiratory ROS: no cough, shortness of breath, or wheezing Cardiovascular ROS: no chest pain or dyspnea on exertion Gastrointestinal ROS: no abdominal pain, change in bowel habits, or black or bloody stools Musculoskeletal ROS: positive for - joint swelling Neurological ROS: positive for - numbness/tingling Dermatological ROS: positive for L foot incision/wound  Medications Prior to Admission  Medication Sig Dispense Refill  . amLODipine (NORVASC) 10 MG tablet TAKE ONE (1) TABLET EACH DAY (Patient taking differently: Take 10 mg by mouth every morning. ) 90 tablet 3  . aspirin EC 81 MG tablet Take 1 tablet (81 mg total) by mouth daily. 150 tablet 2  . atorvastatin (LIPITOR) 40 MG tablet TAKE ONE (1) TABLET EACH DAY (Patient taking differently: Take 40 mg by mouth daily. ) 90 tablet 3  . blood glucose meter kit and supplies KIT E11.51, check 2x daily, dispense based on patient and insurance preference 1 each 0  . cephALEXin (KEFLEX) 500 MG capsule Take 1 capsule (500 mg total) by mouth 4 (four) times daily. 40 capsule 0  . clopidogrel (PLAVIX) 75 MG tablet TAKE ONE (1) TABLET EACH DAY (Patient taking differently: Take 75 mg by mouth daily. ) 90  tablet 3  . doxycycline (VIBRA-TABS) 100 MG tablet Take 1 tablet (100 mg total) by mouth 2 (two) times daily. 20 tablet 0  . empagliflozin (JARDIANCE) 25 MG TABS tablet Take 25 mg by mouth daily. 180 tablet 1  . fluticasone (FLONASE) 50 MCG/ACT nasal spray Place 2 sprays into both nostrils daily. 16 g 2  . gabapentin (NEURONTIN) 300 MG capsule Take 1 capsule (300 mg total) by mouth 4 (four) times daily. 360 capsule 2  . glipiZIDE (GLUCOTROL XL) 10 MG 24 hr tablet TAKE ONE (1) TABLET EACH DAY WITH BREAKFAST (Patient taking differently: Take 10 mg by mouth daily with breakfast. )  90 tablet 3  . glucose blood (ONE TOUCH ULTRA TEST) test strip Check blood sugar twice daily Dx code: E11.9 200 each 0  . HYDROcodone-acetaminophen (NORCO) 5-325 MG tablet Take 1 tablet by mouth every 6 (six) hours as needed for moderate pain. 30 tablet 0  . lisinopril-hydrochlorothiazide (PRINZIDE,ZESTORETIC) 20-25 MG tablet TAKE 1 TABLET BY MOUTH EVERY DAY (Patient taking differently: Take 1 tablet by mouth daily. ) 90 tablet 3  . metFORMIN (GLUCOPHAGE) 1000 MG tablet TAKE 1 TABLET BY MOUTH TWICE DAILY WITH A MEAL 180 tablet 3  . metoprolol succinate (TOPROL-XL) 50 MG 24 hr tablet Take 1 tablet (50 mg total) by mouth daily. (Patient taking differently: Take 50 mg by mouth every morning. ) 90 tablet 1  . TRADJENTA 5 MG TABS tablet TAKE 1 TABLET BY MOUTH EVERY DAY (Patient taking differently: Take 5 mg by mouth daily. ) 30 tablet 3  . traMADol (ULTRAM) 50 MG tablet TAKE 1 TABLET BY MOUTH THREE TIMES A DAY (Patient taking differently: Take 50 mg by mouth 3 (three) times daily. ) 90 tablet 0  . cyclobenzaprine (FLEXERIL) 10 MG tablet TAKE ONE TABLET BY MOUTH 3 TIMES DAILY AS NEEDED FOR MUSCLE SPASMS. (Patient not taking: No sig reported) 30 tablet 1  . mupirocin ointment (BACTROBAN) 2 % Place thin layer of ointment over red area of skin on left leg 2 times per day for 7 days. (Patient not taking: Reported on 10/25/2018) 22 g 0  . Vitamin D, Ergocalciferol, (DRISDOL) 50000 units CAPS capsule Take 1 capsule (50,000 Units total) by mouth every 7 (seven) days. 8 capsule 0    Physical Exam: General: Alert and oriented.  No apparent distress.  Vascular: DP/PT pulses palpable bil, CFT intact to digits and feet bil, no hair growth bil.  Neuro: Light touch sensation reduced  BLE  Derm: Incision line to the L lateral foot intact with sutures intact, serousanginous drainage present today, minimal erythema and edema present.      MSK: Mild POP to L foot and ankle, L 4th and 5th ray amputations, L partial  cuboid resection.   Results for orders placed or performed during the hospital encounter of 10/25/18 (from the past 48 hour(s))  Glucose, capillary     Status: Abnormal   Collection Time: 10/26/18  1:52 PM  Result Value Ref Range   Glucose-Capillary 124 (H) 70 - 99 mg/dL  Glucose, capillary     Status: Abnormal   Collection Time: 10/26/18  4:54 PM  Result Value Ref Range   Glucose-Capillary 222 (H) 70 - 99 mg/dL  Glucose, capillary     Status: Abnormal   Collection Time: 10/26/18  8:51 PM  Result Value Ref Range   Glucose-Capillary 441 (H) 70 - 99 mg/dL  Glucose, random     Status: Abnormal   Collection Time: 10/26/18  9:09 PM  Result Value Ref Range   Glucose, Bld 431 (H) 70 - 99 mg/dL    Comment: Performed at [facility], [address] [place], [place] [address]  Glucose, capillary     Status: Abnormal   Collection Time: 10/27/18 12:52 AM  Result Value Ref Range   Glucose-Capillary 294 (H) 70 - 99 mg/dL   Comment 1 Notify RN   Creatinine, serum     Status: None   Collection Time: 10/27/18  3:52 AM  Result Value Ref Range   Creatinine, Ser 1.13 0.61 - 1.24 mg/dL   GFR calc non Af Amer >60 >60 mL/min   GFR calc Af Amer >60 >60 mL/min    Comment: Performed at [facility], [address] [place], [place] [address]  Glucose, capillary     Status: Abnormal   Collection Time: 10/27/18  7:38 AM  Result Value Ref Range   Glucose-Capillary 166 (H) 70 - 99 mg/dL   Comment 1 Notify RN   Glucose, capillary     Status: Abnormal   Collection Time: 10/27/18 11:31 AM  Result Value Ref Range   Glucose-Capillary 164 (H) 70 - 99 mg/dL   Comment 1 Notify RN   Glucose, capillary     Status: Abnormal   Collection Time: 10/27/18  4:40 PM  Result Value Ref Range   Glucose-Capillary 136 (H) 70 - 99 mg/dL   Comment 1 Notify RN   Glucose, capillary     Status: Abnormal   Collection Time: 10/27/18  9:07 PM  Result Value Ref Range   Glucose-Capillary 148 (H) 70 - 99  mg/dL  CBC     Status: Abnormal   Collection Time: 10/28/18  4:09 AM  Result Value Ref Range   WBC 8.3 4.0 - 10.5 K/uL   RBC 3.70 (L) 4.22 - 5.81 MIL/uL   Hemoglobin 10.7 (L) 13.0 - 17.0 g/dL   HCT 33.0 (L) 39.0 - 52.0 %   MCV 89.2 80.0 - 100.0 fL   MCH 28.9 26.0 - 34.0 pg   MCHC 32.4 30.0 - 36.0 g/dL   RDW 13.1 11.5 - 15.5 %   Platelets 321 150 - 400 K/uL   nRBC 0.0 0.0 - 0.2 %    Comment: Performed at [facility], [address] [place], [place] [address]  Basic metabolic panel     Status: Abnormal   Collection Time: 10/28/18  4:09 AM  Result Value Ref Range   Sodium 137 135 - 145 mmol/L   Potassium 4.0 3.5 - 5.1 mmol/L   Chloride 102 98 - 111 mmol/L   CO2 27 22 - 32 mmol/L   Glucose, Bld 103 (H) 70 - 99 mg/dL   BUN 26 (H) 6 - 20 mg/dL   Creatinine, Ser 0.87 0.61 - 1.24 mg/dL   Calcium 8.8 (L) 8.9 - 10.3 mg/dL   GFR calc non Af Amer >60 >60 mL/min   GFR calc Af Amer >60 >60 mL/min   Anion gap 8 5 - 15    Comment: Performed at [facility], [address] [place], [place] [address]  Glucose, capillary     Status: None   Collection Time: 10/28/18  7:44 AM  Result Value Ref Range   Glucose-Capillary 80 70 - 99 mg/dL   Comment 1 Notify RN   Glucose, capillary     Status: None   Collection Time: 10/28/18 11:48 AM  Result Value Ref Range   Glucose-Capillary 89 70 -  99 mg/dL   Comment 1 Notify RN    No results found.  Blood pressure 113/66, pulse (!) 55, temperature 98.1 F (36.7 C), temperature source Oral, resp. rate 15, height '6\' 3"'$  (1.905 m), weight 133 kg, SpO2 100 %.  Assessment 1. Cellulitis L foot 2. Osteomyelitis L forefoot/midfoot s/p partial foot amputation with closure by Dr. Vickki Muff on 10/26/18 3. DM2 with polyneuropathy  Plan -Examined L foot -Incision line intact with sutures intact. Minimal drainage and decreased erythema present.  Drain was removed previously. -Leave current dressing CDI until postop.  If still in hospital, will  change on Tuesday. -Continue to monitor bone and wound cultures.  Bone culture NGTD, path pending. -Appreciate recs for Abx.  Appreciate ID recs.  Monitor cultures to decide on Abx path IV vs. Oral for discharge. -Continue NWB to L foot at all times. -Elevate L foot on 2 pillows when at rest or in chair. -Will continue to monitor until discharge.  Likely Tuesday or after path report is back.  Caroline More 10/28/2018, 1:47 PM

## 2018-10-29 LAB — GLUCOSE, CAPILLARY
Glucose-Capillary: 89 mg/dL (ref 70–99)
Glucose-Capillary: 99 mg/dL (ref 70–99)
Glucose-Capillary: 121 mg/dL — ABNORMAL HIGH (ref 70–99)
Glucose-Capillary: 134 mg/dL — ABNORMAL HIGH (ref 70–99)

## 2018-10-29 LAB — VANCOMYCIN, TROUGH: Vancomycin Tr: 20 ug/mL (ref 15–20)

## 2018-10-29 MED ORDER — CIPROFLOXACIN IN D5W 400 MG/200ML IV SOLN
400.0000 mg | Freq: Two times a day (BID) | INTRAVENOUS | Status: DC
  Filled 2018-10-29: qty 200

## 2018-10-29 MED ORDER — VANCOMYCIN HCL IN DEXTROSE 1-5 GM/200ML-% IV SOLN
1000.0000 mg | Freq: Two times a day (BID) | INTRAVENOUS | Status: DC
  Administered 2018-10-29 – 2018-10-30 (×2): 1000 mg via INTRAVENOUS
  Filled 2018-10-29 (×4): qty 200

## 2018-10-29 NOTE — Consult Note (Signed)
Pharmacy Antibiotic Note  Joseph Singh is a 58 y.o. male admitted on 10/25/2018 with wound infection. Patient presents c/o worsening left foot infection with drainage. Recent debridement of left fifth metatarsal in June. Patient was seen by podiatry outpatient today where x-ray demonstrated changes consistent with osteomyelitis. Patient was on oral doxycycline for a right ear infection prior to admission since d/c. Pharmacy has been consulted for vancomycin. ID following.  Plan: Change to vancomycin 1g q12h.  Goal AUC 400-550. Expected AUC: 512, trough: 15.8  This changed based on 2 level kinetic calculator  Dose 1250mg  (on q12 interval) 9/6 @ 1650  Peak 9/6 2100, Trough 9/7 0452  Height: 6\' 3"  (190.5 cm) Weight: 293 lb 3.4 oz (133 kg) IBW/kg (Calculated) : 84.5  Temp (24hrs), Avg:98.3 F (36.8 C), Min:98 F (36.7 C), Max:98.5 F (36.9 C)  Recent Labs  Lab 10/25/18 1241 10/26/18 0251 10/27/18 0352 10/28/18 0409 10/28/18 2100 10/29/18 0452  WBC 8.1  --   --  8.3  --   --   CREATININE 1.09 0.97 1.13 0.87  --   --   VANCOTROUGH  --   --   --   --   --  20  VANCOPEAK  --   --   --   --  30  --     Estimated Creatinine Clearance: 134.4 mL/min (by C-G formula based on SCr of 0.87 mg/dL).    No Known Allergies  Antimicrobials this admission: Vancomycin 9/3 >>  Pip/tazo 9/3 >>  Doxycycline 9/3 >>9/3  Dose adjustments this admission: 9/7 Change Vancomycin 1250mg  q12 to 1000mg  q12  Microbiology results: 9/4 wound culture: Staph Epidermidis 9/3 BCx: NGTD  Thank you for allowing pharmacy to be a part of this patient's care.  Lu Duffel, PharmD, BCPS Clinical Pharmacist 10/29/2018 1:31 PM

## 2018-10-29 NOTE — Care Management Important Message (Signed)
Important Message  Patient Details  Name: Joseph Singh MRN: [id number] Date of Birth: 26-Dec-1960   Medicare Important Message Given:  Yes     Juliann Pulse A Tam Savoia 10/29/2018, 11:29 AM

## 2018-10-29 NOTE — Progress Notes (Signed)
PODIATRY / FOOT AND ANKLE SURGERY PROGRESS NOTE  Chief Complaint: L foot wound   HPI: Joseph Singh is a 58 y.o. male who presents s/p 3d L 4th and 5th ray amputations and cuboid partial resection with closure of wound.  Pt states that he is feeling well today overall.  He only has minimal pain to the L foot.  He has noticed a small amount of strikethrough through his bandage today.  Pt denies n/v/f/c.  PMHx:  Past Medical History:  Diagnosis Date  . Arthritis   . Diabetes mellitus without complication ([place])   . History of kidney stones    h/o  . Hyperlipidemia   . Hypertension   . Neuropathy     Surgical Hx:  Past Surgical History:  Procedure Laterality Date  . AMPUTATION TOE Left 08/10/2018   Procedure: RAY LEFT;  Surgeon: Samara Deist, DPM;  Location: [facility];  Service: Podiatry;  Laterality: Left;  . IRRIGATION AND DEBRIDEMENT FOOT Left 10/26/2018   Procedure: IRRIGATION AND DEBRIDEMENT FOOT, LEFT;  Surgeon: Samara Deist, DPM;  Location: [facility];  Service: Podiatry;  Laterality: Left;  . LOWER EXTREMITY ANGIOGRAPHY Right 02/25/2016   Procedure: Lower Extremity Angiography;  Surgeon: Algernon Huxley, MD;  Location: [facility];  Service: Cardiovascular;  Laterality: Right;  . LOWER EXTREMITY ANGIOGRAPHY Right 02/27/2017   Procedure: LOWER EXTREMITY ANGIOGRAPHY;  Surgeon: Algernon Huxley, MD;  Location: [facility];  Service: Cardiovascular;  Laterality: Right;  . LOWER EXTREMITY ANGIOGRAPHY Left 08/06/2018   Procedure: LOWER EXTREMITY ANGIOGRAPHY;  Surgeon: Algernon Huxley, MD;  Location: [facility];  Service: Cardiovascular;  Laterality: Left;  . LOWER EXTREMITY INTERVENTION  02/25/2016   Procedure: Lower Extremity Intervention;  Surgeon: Algernon Huxley, MD;  Location: [facility];  Service: Cardiovascular;;  . TOE AMPUTATION      FHx:  Family History  Problem Relation Age of Onset  . Diabetes Mother   . Heart disease Father   . Diabetes Sister   .  Diabetes Brother   . Cancer Sister        lung  . Cancer Sister        breast    Social History:  reports that he quit smoking about 2 months ago. His smoking use included cigarettes. He has a 17.50 pack-year smoking history. He has never used smokeless tobacco. He reports that he does not drink alcohol or use drugs.  Allergies: No Known Allergies  Review of Systems: General ROS: negative Psychological ROS: negative ENT ROS: negative Respiratory ROS: no cough, shortness of breath, or wheezing Cardiovascular ROS: no chest pain or dyspnea on exertion Gastrointestinal ROS: no abdominal pain, change in bowel habits, or black or bloody stools Musculoskeletal ROS: positive for - joint swelling Neurological ROS: positive for - numbness/tingling Dermatological ROS: positive for L foot incision/wound  Medications Prior to Admission  Medication Sig Dispense Refill  . amLODipine (NORVASC) 10 MG tablet TAKE ONE (1) TABLET EACH DAY (Patient taking differently: Take 10 mg by mouth every morning. ) 90 tablet 3  . aspirin EC 81 MG tablet Take 1 tablet (81 mg total) by mouth daily. 150 tablet 2  . atorvastatin (LIPITOR) 40 MG tablet TAKE ONE (1) TABLET EACH DAY (Patient taking differently: Take 40 mg by mouth daily. ) 90 tablet 3  . blood glucose meter kit and supplies KIT E11.51, check 2x daily, dispense based on patient and insurance preference 1 each 0  .  cephALEXin (KEFLEX) 500 MG capsule Take 1 capsule (500 mg total) by mouth 4 (four) times daily. 40 capsule 0  . clopidogrel (PLAVIX) 75 MG tablet TAKE ONE (1) TABLET EACH DAY (Patient taking differently: Take 75 mg by mouth daily. ) 90 tablet 3  . doxycycline (VIBRA-TABS) 100 MG tablet Take 1 tablet (100 mg total) by mouth 2 (two) times daily. 20 tablet 0  . empagliflozin (JARDIANCE) 25 MG TABS tablet Take 25 mg by mouth daily. 180 tablet 1  . fluticasone (FLONASE) 50 MCG/ACT nasal spray Place 2 sprays into both nostrils daily. 16 g 2  .  gabapentin (NEURONTIN) 300 MG capsule Take 1 capsule (300 mg total) by mouth 4 (four) times daily. 360 capsule 2  . glipiZIDE (GLUCOTROL XL) 10 MG 24 hr tablet TAKE ONE (1) TABLET EACH DAY WITH BREAKFAST (Patient taking differently: Take 10 mg by mouth daily with breakfast. ) 90 tablet 3  . glucose blood (ONE TOUCH ULTRA TEST) test strip Check blood sugar twice daily Dx code: E11.9 200 each 0  . HYDROcodone-acetaminophen (NORCO) 5-325 MG tablet Take 1 tablet by mouth every 6 (six) hours as needed for moderate pain. 30 tablet 0  . lisinopril-hydrochlorothiazide (PRINZIDE,ZESTORETIC) 20-25 MG tablet TAKE 1 TABLET BY MOUTH EVERY DAY (Patient taking differently: Take 1 tablet by mouth daily. ) 90 tablet 3  . metFORMIN (GLUCOPHAGE) 1000 MG tablet TAKE 1 TABLET BY MOUTH TWICE DAILY WITH A MEAL 180 tablet 3  . metoprolol succinate (TOPROL-XL) 50 MG 24 hr tablet Take 1 tablet (50 mg total) by mouth daily. (Patient taking differently: Take 50 mg by mouth every morning. ) 90 tablet 1  . TRADJENTA 5 MG TABS tablet TAKE 1 TABLET BY MOUTH EVERY DAY (Patient taking differently: Take 5 mg by mouth daily. ) 30 tablet 3  . traMADol (ULTRAM) 50 MG tablet TAKE 1 TABLET BY MOUTH THREE TIMES A DAY (Patient taking differently: Take 50 mg by mouth 3 (three) times daily. ) 90 tablet 0  . cyclobenzaprine (FLEXERIL) 10 MG tablet TAKE ONE TABLET BY MOUTH 3 TIMES DAILY AS NEEDED FOR MUSCLE SPASMS. (Patient not taking: No sig reported) 30 tablet 1  . mupirocin ointment (BACTROBAN) 2 % Place thin layer of ointment over red area of skin on left leg 2 times per day for 7 days. (Patient not taking: Reported on 10/25/2018) 22 g 0  . Vitamin D, Ergocalciferol, (DRISDOL) 50000 units CAPS capsule Take 1 capsule (50,000 Units total) by mouth every 7 (seven) days. 8 capsule 0    Physical Exam: General: Alert and oriented.  No apparent distress.  Vascular: DP/PT pulses palpable bil, CFT intact to digits and feet bil, no hair growth bil.  No  active bleeding present from the incision left foot.  Neuro: Light touch sensation reduced  BLE  Derm: Incision line to the L lateral foot intact with sutures intact, minimal serousanginous drainage present today, minimal erythema and edema present.      MSK: Mild POP to L foot and ankle, L 4th and 5th ray amputations, L partial cuboid resection.   Results for orders placed or performed during the hospital encounter of 10/25/18 (from the past 48 hour(s))  Glucose, capillary     Status: Abnormal   Collection Time: 10/27/18  4:40 PM  Result Value Ref Range   Glucose-Capillary 136 (H) 70 - 99 mg/dL   Comment 1 Notify RN   Glucose, capillary     Status: Abnormal   Collection Time: 10/27/18  9:07 PM  Result Value Ref Range   Glucose-Capillary 148 (H) 70 - 99 mg/dL  CBC     Status: Abnormal   Collection Time: 10/28/18  4:09 AM  Result Value Ref Range   WBC 8.3 4.0 - 10.5 K/uL   RBC 3.70 (L) 4.22 - 5.81 MIL/uL   Hemoglobin 10.7 (L) 13.0 - 17.0 g/dL   HCT 33.0 (L) 39.0 - 52.0 %   MCV 89.2 80.0 - 100.0 fL   MCH 28.9 26.0 - 34.0 pg   MCHC 32.4 30.0 - 36.0 g/dL   RDW 13.1 11.5 - 15.5 %   Platelets 321 150 - 400 K/uL   nRBC 0.0 0.0 - 0.2 %    Comment: Performed at [facility], [address] [place], [place] [address]  Basic metabolic panel     Status: Abnormal   Collection Time: 10/28/18  4:09 AM  Result Value Ref Range   Sodium 137 135 - 145 mmol/L   Potassium 4.0 3.5 - 5.1 mmol/L   Chloride 102 98 - 111 mmol/L   CO2 27 22 - 32 mmol/L   Glucose, Bld 103 (H) 70 - 99 mg/dL   BUN 26 (H) 6 - 20 mg/dL   Creatinine, Ser 0.87 0.61 - 1.24 mg/dL   Calcium 8.8 (L) 8.9 - 10.3 mg/dL   GFR calc non Af Amer >60 >60 mL/min   GFR calc Af Amer >60 >60 mL/min   Anion gap 8 5 - 15    Comment: Performed at [facility], [address] [place], [place] [address]  Glucose, capillary     Status: None   Collection Time: 10/28/18  7:44 AM  Result Value Ref Range    Glucose-Capillary 80 70 - 99 mg/dL   Comment 1 Notify RN   Glucose, capillary     Status: None   Collection Time: 10/28/18 11:48 AM  Result Value Ref Range   Glucose-Capillary 89 70 - 99 mg/dL   Comment 1 Notify RN   Glucose, capillary     Status: Abnormal   Collection Time: 10/28/18  4:34 PM  Result Value Ref Range   Glucose-Capillary 132 (H) 70 - 99 mg/dL   Comment 1 Notify RN   Vancomycin, peak     Status: None   Collection Time: 10/28/18  9:00 PM  Result Value Ref Range   Vancomycin Pk 30 30 - 40 ug/mL    Comment: Performed at [facility], [address] [place], [place] [address]  Glucose, capillary     Status: Abnormal   Collection Time: 10/28/18  9:39 PM  Result Value Ref Range   Glucose-Capillary 164 (H) 70 - 99 mg/dL  Vancomycin, trough     Status: None   Collection Time: 10/29/18  4:52 AM  Result Value Ref Range   Vancomycin Tr 20 15 - 20 ug/mL    Comment: Performed at [facility], [address] [place], [place] [address]  Glucose, capillary     Status: None   Collection Time: 10/29/18  8:10 AM  Result Value Ref Range   Glucose-Capillary 89 70 - 99 mg/dL   No results found.  Blood pressure (!) 143/69, pulse 67, temperature 98 F (36.7 C), temperature source Oral, resp. rate 18, height 6' 3" (1.905 m), weight 133 kg, SpO2 100 %.  Assessment 1. Cellulitis L foot 2. Osteomyelitis L forefoot/midfoot s/p partial foot amputation with closure by Dr. Vickki Muff on 10/26/18 3. DM2 with polyneuropathy  Plan -Examined  L foot -Incision line intact with sutures intact. Minimal drainage and decreased erythema present.  -Leave current dressing CDI until postop outpatient visit.  -Continue to monitor bone and wound cultures.  Bone culture staph epidermidis, path pending. -Appreciate recs for Abx.  Appreciate ID recs.  Monitor cultures to decide on Abx path IV vs. Oral for discharge. -Continue NWB to L foot at all times. -Elevate L foot on 2 pillows when at  rest or in chair. -Will continue to monitor until discharge.  Caroline More 10/29/2018, 11:43 AM

## 2018-10-29 NOTE — Progress Notes (Signed)
[facility] [place] NAME: Joseph Singh    MR#:  [id number]  DATE OF BIRTH:  1960/08/10  SUBJECTIVE:  CHIEF COMPLAINT:  No chief complaint on file.  Patient was sent from podiatry clinic with osteomyelitis on the foot , status post resection of fourth metatarsal bone and cuboid bone.  Feels better today.  REVIEW OF SYSTEMS:  CONSTITUTIONAL: No fever, fatigue or weakness.  EYES: No blurred or double vision.  EARS, NOSE, AND THROAT: No tinnitus or ear pain.  RESPIRATORY: No cough, shortness of breath, wheezing or hemoptysis.  CARDIOVASCULAR: No chest pain, orthopnea, edema.  GASTROINTESTINAL: No nausea, vomiting, diarrhea or abdominal pain.  GENITOURINARY: No dysuria, hematuria.  ENDOCRINE: No polyuria, nocturia,  HEMATOLOGY: No anemia, easy bruising or bleeding SKIN: No rash or lesion. MUSCULOSKELETAL: No joint pain or arthritis.   NEUROLOGIC: No tingling, numbness, weakness.  PSYCHIATRY: No anxiety or depression.   ROS  DRUG ALLERGIES:  No Known Allergies  VITALS:  Blood pressure (!) 143/69, pulse 67, temperature 98 F (36.7 C), temperature source Oral, resp. rate 18, height 6\' 3"  (1.905 m), weight 133 kg, SpO2 100 %.  PHYSICAL EXAMINATION:  GENERAL:  58 y.o.-year-old patient lying in the bed with no acute distress.  EYES: Pupils equal, round, reactive to light and accommodation. No scleral icterus. Extraocular muscles intact.  HEENT: Head atraumatic, normocephalic. Oropharynx and nasopharynx clear.  NECK:  Supple, no jugular venous distention. No thyroid enlargement, no tenderness.  LUNGS: Normal breath sounds bilaterally, no wheezing, rales,rhonchi or crepitation. No use of accessory muscles of respiration.  CARDIOVASCULAR: S1, S2 normal. No murmurs, rubs, or gallops.  ABDOMEN: Soft, nontender, nondistended. Bowel sounds present. No organomegaly or mass.  EXTREMITIES: No pedal edema, cyanosis, or clubbing.  Left foot had dressing  present when I saw. NEUROLOGIC: Cranial nerves II through XII are intact. Muscle strength 5/5 in all extremities. Sensation intact. Gait not checked.  PSYCHIATRIC: The patient is alert and oriented x 3.  SKIN: No obvious rash, lesion, or ulcer.   Physical Exam LABORATORY PANEL:   CBC Recent Labs  Lab 10/28/18 0409  WBC 8.3  HGB 10.7*  HCT 33.0*  PLT 321   ------------------------------------------------------------------------------------------------------------------  Chemistries  Recent Labs  Lab 10/25/18 1241  10/28/18 0409  NA 135  --  137  K 4.5  --  4.0  CL 99  --  102  CO2 26  --  27  GLUCOSE 154*   < > 103*  BUN 31*  --  26*  CREATININE 1.09   < > 0.87  CALCIUM 9.2  --  8.8*  MG 1.8  --   --   AST 13*  --   --   ALT 17  --   --   ALKPHOS 110  --   --   BILITOT 0.4  --   --    < > = values in this interval not displayed.   ------------------------------------------------------------------------------------------------------------------  Cardiac Enzymes No results for input(s): TROPONINI in the last 168 hours. ------------------------------------------------------------------------------------------------------------------  RADIOLOGY:  No results found.  ASSESSMENT AND PLAN:   Active Problems:   Osteomyelitis of foot (Joseph Singh)  58 y.o. male with past medical history of diabetes mellitus, kidney stones, hyperlipidemia, hypertension, neuropathy, nonhealing ulceration on the left foot, former smoker, PAD with recent revascularization by vascular surgery and recent diagnosis of right ear infection with bilateral effusion presenting as a direct admit from podiatry clinic with left foot osteomyelitis.  1. Left  foot osteomyelitis -x-ray shows obvious erosive change consistent with osteomyelitis to the lateral left foot - Negative blood cultures - Reviewed MRI left foot to evaluate the extent of the infection -  empiric antibiotics with vancomycin - Dr. Ether Singh  had done amputation of fourth metatarsal bone and cuboid bone on left. Appreciate help by ID, able to de-escalate antibiotics soon. - Waiting for Cx report from Bone to decide on Abx. The skin culture reports are back but not from the bone during the surgery so ID suggested to continue the same antibiotics for now as patient had MRSA in the past.  2. Right ear infection -Patient was on doxycycline, now on Vanco and Zosyn for osteomyelitis.   3. Diabetes Mellitus Type 2 with complications - Recent HgbA1c 7.6. Goal < 7.0 - Checked hemoglobin A1c-now 8.1 - Hold metformin, Jardiance,Tradjenta, and glipizide for now - CBG monitoring - SSI - DM education and close PCP follow up Resume his glipizide and metformin now.  4. HTN  + Goal BP <130/80 - Continue amlodipine, lisinopril and metoprolol. Blood pressure has been borderline soft. Stopped amlodipine and held lisinopril and metoprolol today\ Pt was eating lot of salt at home, here in hospital BP is normal without any meds- advised that he may not need any BP meds if follow dietary advise.  5. HLD  + Goal LDL<100 - Atorvastatin 40mg  PO qhs  6. PAD with recent revascularization per vascular - Continue aspirin and Plavix  7. DVT prophylaxis - Enoxaparin SubQ     All the records are reviewed and case discussed with Care Management/Social Workerr. Management plans discussed with the patient, family and they are in agreement.  CODE STATUS: Full  TOTAL TIME TAKING CARE OF THIS PATIENT: 35 minutes.    POSSIBLE D/C IN 1-2 DAYS, DEPENDING ON CLINICAL CONDITION.   Joseph DillingVaibhavkumar Kalim Singh M.D on 10/29/2018   Between 7am to 6pm - Pager - [phone]  After 6pm go to [email or web address] - Social research officer, governmentpassword EPAS [facility]  [facility]  Office  [phone]  CC: Primary care physician; Joseph Singh, Joseph G, MD  Note: This dictation was prepared with Dragon dictation along with smaller phrase technology. Any transcriptional errors  that result from this process are unintentional.

## 2018-10-29 NOTE — TOC Initial Note (Signed)
Transition of Care [facility]) - Initial/Assessment Note    Patient Details  Name: Joseph Singh MRN: [id number] Date of Birth: 1960/06/14  Transition of [facility]) CM/SW Contact:    Sample, Lenice Llamas Phone Number: [phone]  10/29/2018, 1:34 PM  Clinical Narrative: Clinical Social Worker (Landa) met with patient and his wife [name] [phone] at bedside today. PT is recommending home health. CSW introduced self and explained role of CSW department. Patient was alert and oriented X4 and was sitting up in the bed. Patient reported that he lives in [place] with his wife on the [place] side. Per wife she bought a knee scooter for patient today. Patient and wife requested a bedside commode. Patient reported that PT delivered rolling walker to his room to take home. CSW sent [employer] agency representative an email making him aware of above. Patient and wife are agreeable to home health and do not have a preference. Per Lauretta [facility] home health agency representative they can accept patient. Wife and patient are agreeable to [facility]. Per wife she can do patient's home IV ABX if he needs to D/C with a PICC line. Patient prefers to D/C on PO ABX. Per MD patient's culture is pending and patient will likely D/C tomorrow. Patient and his wife are aware of above. [place] is aware that patient may need IV ABX. CSW sent Pam IV ABX coordinator with [employer] a message giving her a heads up. CSW will continue to follow and assist as needed.                   Expected Discharge Plan: [place] Barriers to Discharge: Continued Medical Work up   Patient Goals and CMS Choice Patient states their goals for this hospitalization and ongoing recovery are:: To go home. CMS Medicare.gov Compare Post Acute Care list provided to:: Patient Choice offered to / list presented to : Patient  Expected Discharge Plan and Services Expected Discharge Plan: [place] In-house Referral: Clinical Social Work Discharge Planning Services: CM Consult Post Acute Care Choice: [place] arrangements for the past 2 months: Single Family Home                 DME Arranged: Bedside commode DME Agency: [employer] Date DME Agency Contacted: 10/29/18 Time DME Agency Contacted: [phone] Representative spoke with at DME Agency: [place]: PT, RN [place] Agency: [facility] Date [place]: 10/29/18 Time [place]: 1333 Representative spoke with at [place]: [place]  Prior Living Arrangements/Services Living arrangements for the past 2 months: [place] with:: Spouse Patient language and need for interpreter reviewed:: No Do you feel safe going back to the place where you live?: Yes      Need for Family Participation in Patient Care: Yes (Comment) Care giver support system in place?: Yes (comment) Current home services: DME(Patient has a knee scooter at home.) Criminal Activity/Legal Involvement Pertinent to Current Situation/Hospitalization: No - Comment as needed  Activities of Daily Living Home Assistive Devices/Equipment: None ADL Screening (condition at time of admission) Patient's cognitive ability adequate to safely complete daily activities?: Yes Is the patient deaf or have difficulty hearing?: No Does the patient have difficulty seeing, even when wearing glasses/contacts?: No Does the patient have difficulty concentrating, remembering, or making decisions?: No Patient able to express need for assistance with ADLs?: Yes Does the patient have difficulty dressing or bathing?: No  Independently performs ADLs?: Yes (appropriate for developmental age) Does the patient have difficulty walking or climbing stairs?: Yes Weakness of Legs: Left Weakness of Arms/Hands: None  Permission Sought/Granted Permission sought to share information with : Other (comment)(Home Health agency and DME  agency) Permission granted to share information with : Yes, Verbal Permission Granted              Emotional Assessment Appearance:: Appears stated age Attitude/Demeanor/Rapport: Engaged Affect (typically observed): Pleasant, Accepting Orientation: : Oriented to Self, Oriented to Place, Oriented to  Time, Oriented to Situation Alcohol / Substance Use: Not Applicable Psych Involvement: No (comment)  Admission diagnosis:  gangrene lt foot Patient Active Problem List   Diagnosis Date Noted  . Osteomyelitis of foot ([place]) 10/25/2018  . Suspected Covid-19 Virus Infection 07/02/2018  . Hypogonadism in male 02/23/2018  . Elevated ferritin 02/23/2018  . Decreased energy 05/03/2017  . Callus of foot 04/05/2016  . Tobacco use disorder 02/05/2016  . Atherosclerosis of native arteries of extremity with intermittent claudication ([place]) 02/05/2016  . Hyperlipidemia 02/02/2016  . Essential hypertension 10/31/2015  . Peripheral vascular disease ([place]) 10/31/2015  . Diabetic neuropathy ([place]) 10/31/2015  . DM (diabetes mellitus) type II uncontrolled, periph vascular disorder ([place]) 08/18/2014  . Back pain 06/12/2014  . Metabolic syndrome 29/24/4628  . H/O blood clots 06/12/2014   PCP:  Leone Haven, MD Pharmacy:   [id number] [id number]: [phone]: [phone]    Social Determinants of Health (SDOH) Interventions    Readmission Risk Interventions No flowsheet data found.

## 2018-10-29 NOTE — Progress Notes (Signed)
Patient declines bed alarm so that he can sit on edge of bed to stretch and urinate. Aware that he should call before trying to get up.

## 2018-10-30 ENCOUNTER — Inpatient Hospital Stay: Payer: Self-pay

## 2018-10-30 DIAGNOSIS — B957 Other staphylococcus as the cause of diseases classified elsewhere: Secondary | ICD-10-CM

## 2018-10-30 DIAGNOSIS — A4902 Methicillin resistant Staphylococcus aureus infection, unspecified site: Secondary | ICD-10-CM | POA: Diagnosis not present

## 2018-10-30 LAB — GLUCOSE, CAPILLARY
Glucose-Capillary: 79 mg/dL (ref 70–99)
Glucose-Capillary: 98 mg/dL (ref 70–99)
Glucose-Capillary: 74 mg/dL (ref 70–99)

## 2018-10-30 LAB — CULTURE, BLOOD (ROUTINE X 2)
Culture: NO GROWTH
Culture: NO GROWTH
Special Requests: ADEQUATE
Special Requests: ADEQUATE

## 2018-10-30 MED ORDER — CEFTRIAXONE IV (FOR PTA / DISCHARGE USE ONLY): 2.0000 g | INTRAVENOUS | 0 refills | Status: DC

## 2018-10-30 MED ORDER — BISACODYL 5 MG PO TBEC: 5.0000 mg | DELAYED_RELEASE_TABLET | Freq: Every day | ORAL | Status: DC | PRN

## 2018-10-30 MED ORDER — SODIUM CHLORIDE 0.9% FLUSH: 10.0000 mL | Freq: Two times a day (BID) | INTRAVENOUS | Status: DC

## 2018-10-30 MED ORDER — SODIUM CHLORIDE 0.9% FLUSH: 10.0000 mL | INTRAVENOUS | Status: DC | PRN

## 2018-10-30 MED ORDER — SENNA 8.6 MG PO TABS: 1.0000 | ORAL_TABLET | Freq: Every day | ORAL | Status: DC | PRN

## 2018-10-30 MED ORDER — SODIUM CHLORIDE 0.9 % IV SOLN
2.0000 g | INTRAVENOUS | Status: DC
  Administered 2018-10-30: 12:00:00 2 g via INTRAVENOUS
  Filled 2018-10-30 (×2): qty 20

## 2018-10-30 MED ORDER — VANCOMYCIN IV (FOR PTA / DISCHARGE USE ONLY): 1000.0000 mg | Freq: Two times a day (BID) | INTRAVENOUS | 0 refills | Status: AC

## 2018-10-30 NOTE — Progress Notes (Signed)
-  PHARMACY CONSULT NOTE FOR:  OUTPATIENT  PARENTERAL ANTIBIOTIC THERAPY (OPAT)  Indication: Osteomyelitis left foot Regimen:  --Vancomycin 1000 mg IV BID --Ceftriaxone 2 g IV Q24H  End date: 12/07/2018  IV antibiotic discharge orders are pended. To discharging provider:  please sign these orders via discharge navigator,  Select New Orders & click on the button choice - Manage This Unsigned Work.     Thank you for allowing pharmacy to be a part of this patient's care.  Gerald Dexter 10/30/2018, 11:26 AM

## 2018-10-30 NOTE — Discharge Instructions (Signed)
PODIATRY DISCHARGE INSTRUCTIONS: 1. Leave dressings clean, dry, and intact until appointment with Dr. Vickki Muff unless instructed otherwise.  2. Continue antibiotics and/or pain medication as prescribed. 3. Remain nonweightbearing to the left foot at all times. 4. Elevate left foot when at rest.  HH with RN for wound care and iv abx via PICC line.

## 2018-10-30 NOTE — NC FL2 (Signed)
[place] MEDICAID FL2 LEVEL OF CARE SCREENING TOOL     IDENTIFICATION  Patient Name: Joseph Singh Birthdate: Dec 10, 1960 Sex: male Admission Date (Current Location): 10/25/2018  [place]:  Joseph Singh [id number] [facility] Address[facility]  [facility], [address], [place], [place] [address]      Provider Number: [id number]  Attending Physician Name and Address:  Shaune Pollackhen, Qing, MD  Relative Name and Phone Number:       Current Level of Care: Hospital Recommended Level of Care: Skilled Nursing Facility Prior Approval Number:    Date Approved/Denied:   PASRR Number: [id number] [id number]  Discharge Plan: SNF    Current Diagnoses: Patient Active Problem List   Diagnosis Date Noted  . Osteomyelitis of foot [name] 10/25/2018  . Suspected Covid-19 Virus Infection 07/02/2018  . Hypogonadism in male 02/23/2018  . Elevated ferritin 02/23/2018  . Decreased energy 05/03/2017  . Callus of foot 04/05/2016  . Tobacco use disorder 02/05/2016  . Atherosclerosis of native arteries of extremity with intermittent claudication [name] 02/05/2016  . Hyperlipidemia 02/02/2016  . Essential hypertension 10/31/2015  . Peripheral vascular disease [name] 10/31/2015  . Diabetic neuropathy [name] 10/31/2015  . DM (diabetes mellitus) type II uncontrolled, periph vascular disorder [name] 08/18/2014  . Back pain 06/12/2014  . Metabolic syndrome 06/12/2014  . H/O blood clots 06/12/2014    Orientation RESPIRATION BLADDER Height & Weight     Self, Time, Situation, Place  Normal Continent Weight: 293 lb 3.4 oz (133 kg) Height:  6\' 3"  (190.5 cm)  BEHAVIORAL SYMPTOMS/MOOD NEUROLOGICAL BOWEL NUTRITION STATUS      Continent Diet(Diet: Heart Healthy/ Carb Modified.)  AMBULATORY STATUS COMMUNICATION OF NEEDS Skin   Extensive Assist Verbally Surgical wounds(Incision: Left Foot.)                       Personal Care Assistance Level of Assistance  Bathing, Feeding,  Dressing Bathing Assistance: Limited assistance Feeding assistance: Independent Dressing Assistance: Limited assistance     Functional Limitations Info  Sight, Hearing, Speech Sight Info: Adequate Hearing Info: Adequate Speech Info: Adequate    SPECIAL CARE FACTORS FREQUENCY  PT (By licensed PT), OT (By licensed OT)(IV ABX)     PT Frequency: 5 OT Frequency: 5            Contractures      Additional Factors Info  Code Status, Allergies Code Status Info: Full Code. Allergies Info: No Known Allergies.           Current Medications (10/30/2018):  This is the current hospital active medication list Current Facility-Administered Medications  Medication Dose Route Frequency Provider Last Rate Last Dose  . aspirin EC tablet 81 mg  81 mg Oral Daily Gwyneth RevelsFowler, Justin, DPM   81 mg at 10/30/18 0912  . atorvastatin (LIPITOR) tablet 40 mg  40 mg Oral q1800 Gwyneth RevelsFowler, Justin, DPM   40 mg at 10/29/18 1729  . bisacodyl (DULCOLAX) EC tablet 5 mg  5 mg Oral Daily PRN Shaune Pollackhen, Qing, MD      . cefTRIAXone (ROCEPHIN) 2 g in sodium chloride 0.9 % 100 mL IVPB  2 g Intravenous Q24H Ravishankar, Rhodia AlbrightJayashree, MD 200 mL/hr at 10/30/18 1149 2 g at 10/30/18 1149  . Chlorhexidine Gluconate Cloth 2 % PADS 6 each  6 each Topical Q0600 Gwyneth RevelsFowler, Justin, DPM   6 each at 10/26/18 [phone]  . clopidogrel (PLAVIX) tablet 75 mg  75 mg Oral Daily Gwyneth RevelsFowler, Justin, DPM   75 mg at  10/30/18 0912  . enoxaparin (LOVENOX) injection 40 mg  40 mg Subcutaneous QHS Samara Deist, DPM   40 mg at 10/25/18 1701  . fluticasone (FLONASE) 50 MCG/ACT nasal spray 2 spray  2 spray Each Nare Daily Samara Deist, DPM      . gabapentin (NEURONTIN) capsule 300 mg  300 mg Oral QID Samara Deist, DPM   300 mg at 10/30/18 0912  . glipiZIDE (GLUCOTROL XL) 24 hr tablet 10 mg  10 mg Oral Q breakfast Vaughan Basta, MD   10 mg at 10/30/18 0802  . insulin aspart (novoLOG) injection 0-5 Units  0-5 Units Subcutaneous QHS Lance Coon, MD      .  insulin aspart (novoLOG) injection 0-9 Units  0-9 Units Subcutaneous TID WC Samara Deist, DPM   1 Units at 10/28/18 1645  . lisinopril (ZESTRIL) tablet 10 mg  10 mg Oral Daily Samara Deist, DPM   10 mg at 10/30/18 0912  . metFORMIN (GLUCOPHAGE) tablet 500 mg  500 mg Oral BID WC Vaughan Basta, MD   500 mg at 10/30/18 0802  . morphine 4 MG/ML injection 2 mg  2 mg Intravenous Q2H PRN Samara Deist, DPM      . oxyCODONE-acetaminophen (PERCOCET/ROXICET) 5-325 MG per tablet 1-2 tablet  1-2 tablet Oral Q6H PRN Samara Deist, DPM   1 tablet at 10/30/18 0641  . senna (SENOKOT) tablet 8.6 mg  1 tablet Oral Daily PRN Demetrios Loll, MD      . traMADol Veatrice [place]bon) tablet 50 mg  50 mg Oral TID Samara Deist, DPM   50 mg at 10/30/18 0912  . vancomycin (VANCOCIN) IVPB 1000 mg/200 mL premix  1,000 mg Intravenous Q12H Lu Duffel, RPH 200 mL/hr at 10/30/18 0915 1,000 mg at 10/30/18 0915     Discharge Medications: Please see discharge summary for a list of discharge medications.  Relevant Imaging Results:  Relevant Lab Results:   Additional Information SSN: [id number]  Joseph Singh, Joseph Beets, LCSW

## 2018-10-30 NOTE — Progress Notes (Signed)
Physical Therapy Treatment Patient Details Name: Joseph Singh R [place]worth MRN: [id number] DOB: [birth year] Today's Date: 10/30/2018    History of Present Illness 58 y.o. male with past medical history of diabetes mellitus, kidney stones, hyperlipidemia, hypertension, neuropathy, nonhealing ulceration on the left foot, former smoker, PAD with recent revascularization by vascular surgery and recent diagnosis of right ear infection with bilateral effusion presenting as a direct admit from podiatry clinic with left foot osteomyelitis.  Pt is s/p I&D of L foot    PT Comments    Pt is making good progress towards goals with ability to maintain correct WBing status during mobility efforts. Used knee scooter for mobility in hallway. Performed stair training, safer to perform seated position. Will have 2 people available for home discharge to help with transition up/down off stairs. Safe technique. Will continue to progress as needed.   Follow Up Recommendations  Home health PT;Supervision for mobility/OOB     Equipment Recommendations  Rolling walker with 5" wheels    Recommendations for Other Services       Precautions / Restrictions Precautions Precautions: Fall Restrictions Weight Bearing Restrictions: Yes LLE Weight Bearing: Non weight bearing    Mobility  Bed Mobility Overal bed mobility: Independent             General bed mobility comments: Pt easily able to get to/from sitting/supine w/o assist  Transfers Overall transfer level: Modified independent Equipment used: None             General transfer comment: Pt able to transfer to knee scooter with cga. Able to maintain L NWB without cues. Once on knee scooter, upright with supervision  Ambulation/Gait Ambulation/Gait assistance: Min guard Gait Distance (Feet): 150 Feet Assistive device: (knee scooter)       General Gait Details: ambulated in hallway with knee scooter, able to maintain balance with 2nd person needed for  IV pole management.   Stairs Stairs: Yes Stairs assistance: Mod assist;+2 physical assistance Stair Management: Two rails;Seated/boosting Number of Stairs: 4 General stair comments: Due to B UE weakness, unable to hop up stairs. Demonstrated prior to performance, pt uncomfortable and unable to maintain WBing status. Able to successfully perform seated stair training boosting up stairs with cga and cues for sequencing. Needed +2 mod assist for transition up/down with ability to maintain correct WBing status.   Wheelchair Mobility    Modified Rankin (Stroke Patients Only)       Balance                                            Cognition Arousal/Alertness: Awake/alert Behavior During Therapy: WFL for tasks assessed/performed Overall Cognitive Status: Within Functional Limits for tasks assessed                                        Exercises      General Comments        Pertinent Vitals/Pain Pain Assessment: No/denies pain    Home Living                      Prior Function            PT Goals (current goals can now be found in the care plan section) Acute Rehab PT Goals Patient Stated Goal:  go home PT Goal Formulation: With patient Time For Goal Achievement: 11/10/18 Potential to Achieve Goals: Good Progress towards PT goals: Progressing toward goals    Frequency    Min 2X/week      PT Plan Current plan remains appropriate    Co-evaluation              AM-PAC PT "6 Clicks" Mobility   Outcome Measure  Help needed turning from your back to your side while in a flat bed without using bedrails?: None Help needed moving from lying on your back to sitting on the side of a flat bed without using bedrails?: None Help needed moving to and from a bed to a chair (including a wheelchair)?: A Little Help needed standing up from a chair using your arms (e.g., wheelchair or bedside chair)?: A Little Help needed to  walk in hospital room?: A Little Help needed climbing 3-5 steps with a railing? : A Lot 6 Click Score: 19    End of Session Equipment Utilized During Treatment: Gait belt Activity Tolerance: Patient tolerated treatment well Patient left: in bed Nurse Communication: Mobility status PT Visit Diagnosis: Muscle weakness (generalized) (M62.81);Difficulty in walking, not elsewhere classified (R26.2)     Time: 2683-4196 PT Time Calculation (min) (ACUTE ONLY): 29 min  Charges:  $Gait Training: 23-37 mins                     Greggory Stallion, [place], DPT [phone]    Keaghan Bowens 10/30/2018, 11:19 AM

## 2018-10-30 NOTE — Treatment Plan (Signed)
Diagnosis: Osteomyelitis left foot Baseline Creatinine  1.13  Culture Result: staph epi only Previously MRSA  No Known Allergies  OPAT Orders Discharge antibiotics: Vancomycin 1 gram Iv every 12 hours  Per pharmacy protocol  Aim for Vancomycin trough 15-20 -adjust dose accordingly Duration: 6 weeks End Date:12/07/18  Ceftriaxone 2 grams IV every 24 hours  12/07/18   [facility] Care Per Protocol:incliding placement of Biopatch  Labs  Every Monday  while on IV antibiotics:  _X_ CBC with differential _X Vancomycin trough _X_ CMP  Every Thursday while on antibiotics _X Vancomycin trough _X_ BMP  Every 2 weeks on monday _X_ CRP _X_ ESR  _X_ Please pull PIC at completion of IV antibiotics   Fax weekly labs promptly  to Dr.Jaaziel Peatross  [phone] Pharmacy/Nursing Call [phone] for  critical value or any concerns  Clinic Follow Up Appt: 3weeks with Dr.Abhijot Straughter( call [phone]

## 2018-10-30 NOTE — Progress Notes (Signed)
   Date of Admission:  10/25/2018       Subjective: Patient doing well No fever No chills Pain left leg minimal No nausea or vomiting  Medications:  . aspirin EC  81 mg Oral Daily  . atorvastatin  40 mg Oral q1800  . Chlorhexidine Gluconate Cloth  6 each Topical Q0600  . clopidogrel  75 mg Oral Daily  . enoxaparin (LOVENOX) injection  40 mg Subcutaneous QHS  . fluticasone  2 spray Each Nare Daily  . gabapentin  300 mg Oral QID  . glipiZIDE  10 mg Oral Q breakfast  . insulin aspart  0-5 Units Subcutaneous QHS  . insulin aspart  0-9 Units Subcutaneous TID WC  . lisinopril  10 mg Oral Daily  . metFORMIN  500 mg Oral BID WC  . sodium chloride flush  10-40 mL Intracatheter Q12H  . traMADol  50 mg Oral TID    Objective: Vital signs in last 24 hours: Temp:  [98 F (36.7 C)-98.1 F (36.7 C)] 98.1 F (36.7 C) (09/08 1657) Pulse Rate:  [60-67] 60 (09/08 1657) Resp:  [16-20] 18 (09/08 1657) BP: (130-136)/(60-70) 130/60 (09/08 1657) SpO2:  [98 %-100 %] 99 % (09/08 1657)  PHYSICAL EXAM:  General: Alert, cooperative, no distress, appears stated age.  Head: Normocephalic, without obvious abnormality, atraumatic. Eyes: Conjunctivae clear, anicteric sclerae. Pupils are equal ENT Nares normal. No drainage or sinus tenderness. Lips, mucosa, and tongue normal. No Thrush Neck: Supple, symmetrical, no adenopathy, thyroid: non tender no carotid bruit and no JVD. Back: No CVA tenderness. Lungs: Clear to auscultation bilaterally. No Wheezing or Rhonchi. No rales. Heart: Regular rate and rhythm, no murmur, rub or gallop. Abdomen: Soft, non-tender,not distended. Bowel sounds normal. No masses Extremities: Left leg surgical dressing not removed  On reviewing the picture the surgical site is well coapted    skin: No rashes or lesions. Or bruising Lymph: Cervical, supraclavicular normal. 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  Status post excision of the entire fourth metatarsal, cuboid and left metatarsal base.  Culture just showed only staph epidermidis.  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  Discussed with Dr. Vickki Muff as it could be residual osteo-he will go home on IV vancomycin and IV ceftriaxone for 6 weeks.  Diabetes mellitus on insulin  Hypertension on amlodipine, hydrochlorothiazide and lisinopril.  Also on metoprolol  Hyperlipidemia on statin Peripheral artery disease with recent vascularization  OPAT orders placed. I will follow him as outpatient. We will sign off now.

## 2018-10-30 NOTE — Progress Notes (Signed)
Daily Progress Note   Subjective  - 4 Days Post-Op  F/u left foot debridment.  No complaints  Objective Vitals:   10/29/18 0810 10/29/18 1722 10/29/18 2308 10/30/18 0739  BP: (!) 143/69 (!) 141/66 132/70 136/64  Pulse: 67 65 67 61  Resp: 18 18 20 16   Temp: 98 F (36.7 C) 98 F (36.7 C) 98 F (36.7 C) 98 F (36.7 C)  TempSrc: Oral     SpO2: 100% 98% 100% 98%  Weight:      Height:        Physical Exam: Incision well coapted.  Erythema minimal.  No purulence.  Minimal drainage.  Laboratory CBC    Component Value Date/Time   WBC 8.3 10/28/2018 0409   HGB 10.7 (L) 10/28/2018 0409   HCT 33.0 (L) 10/28/2018 0409   PLT 321 10/28/2018 0409    BMET    Component Value Date/Time   NA 137 10/28/2018 0409   K 4.0 10/28/2018 0409   CL 102 10/28/2018 0409   CO2 27 10/28/2018 0409   GLUCOSE 103 (H) 10/28/2018 0409   BUN 26 (H) 10/28/2018 0409   CREATININE 0.87 10/28/2018 0409   CREATININE 1.15 09/30/2016 1526   CALCIUM 8.8 (L) 10/28/2018 0409   GFRNONAA >60 10/28/2018 0409   GFRAA >60 10/28/2018 0409    Assessment/Planning: Osteomyelitis left foot   Bone culture staph epidermidis.   NWB to left foot is extremely important  ID following for antibiotics.  Will only need dry dressing changed every other day prn.  F/u with podiatry next week.   Joseph Singh A  10/30/2018, 7:57 AM

## 2018-10-30 NOTE — Discharge Summary (Signed)
Joseph Singh NAME: Joseph Singh    MR#:  [id number]  DATE OF BIRTH:  Mar 06, 1960  DATE OF ADMISSION:  10/25/2018   ADMITTING PHYSICIAN: Sela Hua, MD  DATE OF DISCHARGE: 10/30/2018  PRIMARY CARE PHYSICIAN: Leone Haven, MD   ADMISSION DIAGNOSIS:  gangrene lt foot DISCHARGE DIAGNOSIS:  Active Problems:   Osteomyelitis of foot ([place])  SECONDARY DIAGNOSIS:   Past Medical History:  Diagnosis Date   Arthritis    Diabetes mellitus without complication ([place])    History of kidney stones    h/o   Hyperlipidemia    Hypertension    Neuropathy    HOSPITAL COURSE:   Osteomyelitis of foot ([place])  58 y.o.malewith past medical history of diabetes mellitus, kidney stones, hyperlipidemia, hypertension, neuropathy, nonhealing ulceration on the left foot, former smoker,PAD with recent revascularization by vascular surgery and recent diagnosis of right ear infection with bilateral effusion presenting as a direct admit from podiatry clinic with left foot osteomyelitis.  1.Left foot osteomyelitis-x-ray shows obvious erosive change consistent with osteomyelitis to the lateral left foot -Negative blood cultures -Reviewed MRI left foot to evaluate the extent of the infection - empiric antibiotics with vancomycin -Dr. Vickki Muff had done amputation of fourth metatarsal bone and cuboid bone on left. Appreciate help by ID, able to de-escalate antibiotics soon. The skin culture reports are back but not from the bone during the surgery so ID suggested to continue the same antibiotics for now as patient had MRSA in the past. Bone culture report staph epidermidis. Continue IV vancomycin and Rocephin via PICC line until October 16 per Dr. Delaine Lame. Continue wound care.  2.Right ear infection -Patient was on doxycycline.  Antibiotics as above.   3.Diabetes Mellitus Type 2 with complications - Recent MLYY5K 7.6. Goal <  7.0 -Checked hemoglobin A1c-now 8.1 - Hold metformin,Jardiance,Tradjenta,and glipizide for now - CBG monitoring - SSI - DM education and close PCP follow up Resumed his glipizide and metformin.  Resume other home medication.  4.HTN  + Goal BP <130/80 He was on amlodipine, lisinopril-HCTZ and metoprolol. Blood pressure has been borderline soft. Stopped amlodipine and held lisinopril and metoprolol. Pt was eating lot of salt at home, here in hospital BP is normal without any meds- advised that he may not need any BP meds if follow dietary advise. Follow-up PCP to resume.  5.HLD  + Goal LDL<100 - Atorvastatin'40mg'$  PO qhs  6.PAD with recent revascularization per vascular -Continue aspirin and Plavix DISCHARGE CONDITIONS:  Stable, discharge to home today. CONSULTS OBTAINED:  Treatment Team:  Samara Deist, DPM DRUG ALLERGIES:  No Known Allergies DISCHARGE MEDICATIONS:   Allergies as of 10/30/2018   No Known Allergies     Medication List    STOP taking these medications   amLODipine 10 MG tablet Commonly known as: NORVASC   cephALEXin 500 MG capsule Commonly known as: KEFLEX   cyclobenzaprine 10 MG tablet Commonly known as: FLEXERIL   doxycycline 100 MG tablet Commonly known as: VIBRA-TABS   lisinopril-hydrochlorothiazide 20-25 MG tablet Commonly known as: ZESTORETIC   metoprolol succinate 50 MG 24 hr tablet Commonly known as: TOPROL-XL   mupirocin ointment 2 % Commonly known as: BACTROBAN     TAKE these medications   aspirin EC 81 MG tablet Take 1 tablet (81 mg total) by mouth daily.   atorvastatin 40 MG tablet Commonly known as: LIPITOR TAKE ONE (1) TABLET EACH DAY What changed: See the new instructions.  blood glucose meter kit and supplies Kit E11.51, check 2x daily, dispense based on patient and insurance preference   cefTRIAXone  IVPB Commonly known as: ROCEPHIN Inject 2 g into the vein daily. Indication:  Osteomyelitis left  foot Last Day of Therapy:  12/07/2018 Labs - Once weekly (Monday):  CBC/D and CMP, Labs - Every other week:  ESR and CRP  Please pull PIC at completion of IV antibiotics PIC care per protocol: including placement of Biopatch  Fax weekly labs promptly  to Dr.Ravishankar  [phone] Pharmacy/Nursing Call [phone] for  critical value or any concerns   clopidogrel 75 MG tablet Commonly known as: PLAVIX TAKE ONE (1) TABLET EACH DAY What changed: See the new instructions.   empagliflozin 25 MG Tabs tablet Commonly known as: Jardiance Take 25 mg by mouth daily.   fluticasone 50 MCG/ACT nasal spray Commonly known as: FLONASE Place 2 sprays into both nostrils daily.   gabapentin 300 MG capsule Commonly known as: NEURONTIN Take 1 capsule (300 mg total) by mouth 4 (four) times daily.   glipiZIDE 10 MG 24 hr tablet Commonly known as: GLUCOTROL XL TAKE ONE (1) TABLET EACH DAY WITH BREAKFAST What changed: See the new instructions.   glucose blood test strip Commonly known as: ONE TOUCH ULTRA TEST Check blood sugar twice daily Dx code: E11.9   HYDROcodone-acetaminophen 5-325 MG tablet Commonly known as: Norco Take 1 tablet by mouth every 6 (six) hours as needed for moderate pain.   metFORMIN 1000 MG tablet Commonly known as: GLUCOPHAGE TAKE 1 TABLET BY MOUTH TWICE DAILY WITH A MEAL   Tradjenta 5 MG Tabs tablet Generic drug: linagliptin TAKE 1 TABLET BY MOUTH EVERY DAY What changed: how much to take   traMADol 50 MG tablet Commonly known as: ULTRAM TAKE 1 TABLET BY MOUTH THREE TIMES A DAY   vancomycin  IVPB Inject 1,000 mg into the vein every 12 (twelve) hours. Indication:  Osteomyelitis left foot Last Day of Therapy:  12/07/2018 Labs - Monday:  CBC/D, CMP, and vancomycin trough. Labs - Thursday:  BMP and vancomycin trough Labs - Every other week:  ESR and CRP  Please pull PIC at completion of IV antibiotics PIC Care Per Protocol:incliding placement of  Biopatch  Fax weekly labs promptly  to [place]  [phone] Pharmacy/Nursing Call [phone] for  critical value or any concerns   Vitamin D (Ergocalciferol) 1.25 MG (50000 UT) Caps capsule Commonly known as: DRISDOL Take 1 capsule (50,000 Units total) by mouth every 7 (seven) days.            Home Infusion Instuctions  (From admission, onward)         Start     Ordered   10/30/18 0000  Home infusion instructions [facility] May follow [place] Dosing Protocol; May administer Cathflo as needed to maintain patency of vascular access device.; Flushing of vascular access device: per [facility] Protocol: 0.9% NaCl pre/post medica...    Question Answer Comment  Instructions May follow [place] Dosing Protocol   Instructions May administer Cathflo as needed to maintain patency of vascular access device.   Instructions Flushing of vascular access device: per [facility] Protocol: 0.9% NaCl pre/post medication administration and prn patency; Heparin 100 u/ml, 21m for implanted ports and Heparin 10u/ml, 577mfor all other central venous catheters.   Instructions May follow [facility] Anaphylaxis Protocol for First Dose Administration in the home: 0.9% NaCl at 25-50 ml/hr to maintain IV access for protocol meds. Epinephrine 0.3 ml  IV/IM PRN and Benadryl 25-50 IV/IM PRN s/s of anaphylaxis.   Instructions [facility] Infusion Coordinator (RN) to assist per patient IV care needs in the home PRN.      10/30/18 1336           Durable Medical Equipment  (From admission, onward)         Start     Ordered   10/30/18 1149  For home use only DME Bedside commode  Once    Question:  Patient needs a bedside commode to treat with the following condition  Answer:  Weakness generalized   10/30/18 1148   10/26/18 1133  For home use only DME Walker rolling  Once    Question:  Patient needs a walker to treat with the following condition  Answer:  Weakness   10/26/18 1133            DISCHARGE INSTRUCTIONS:  See AVS.  If you experience worsening of your admission symptoms, develop shortness of breath, life threatening emergency, suicidal or homicidal thoughts you must seek medical attention immediately by calling 911 or calling your MD immediately  if symptoms less severe.  You Must read complete instructions/literature along with all the possible adverse reactions/side effects for all the Medicines you take and that have been prescribed to you. Take any new Medicines after you have completely understood and accpet all the possible adverse reactions/side effects.   Please note  You were cared for by a hospitalist during your hospital stay. If you have any questions about your discharge medications or the care you received while you were in the hospital after you are discharged, you can call the unit and asked to speak with the hospitalist on call if the hospitalist that took care of you is not available. Once you are discharged, your primary care physician will handle any further medical issues. Please note that NO REFILLS for any discharge medications will be authorized once you are discharged, as it is imperative that you return to your primary care physician (or establish a relationship with a primary care physician if you do not have one) for your aftercare needs so that they can reassess your need for medications and monitor your lab values.    On the day of Discharge:  VITAL SIGNS:  Blood pressure 136/64, pulse 61, temperature 98 F (36.7 C), resp. rate 16, height '6\' 3"'$  (1.905 m), weight 133 kg, SpO2 98 %. PHYSICAL EXAMINATION:  GENERAL:  58 y.o.-year-old patient lying in the bed with no acute distress.  EYES: Pupils equal, round, reactive to light and accommodation. No scleral icterus. Extraocular muscles intact.  HEENT: Head atraumatic, normocephalic. Oropharynx and nasopharynx clear.  NECK:  Supple, no jugular venous distention. No thyroid enlargement, no  tenderness.  LUNGS: Normal breath sounds bilaterally, no wheezing, rales,rhonchi or crepitation. No use of accessory muscles of respiration.  CARDIOVASCULAR: S1, S2 normal. No murmurs, rubs, or gallops.  ABDOMEN: Soft, non-tender, non-distended. Bowel sounds present. No organomegaly or mass.  EXTREMITIES: No pedal edema, cyanosis, or clubbing.  Left foot in dressing. NEUROLOGIC: Cranial nerves II through XII are intact. Muscle strength 5/5 in all extremities. Sensation intact. Gait not checked.  PSYCHIATRIC: The patient is alert and oriented x 3.  SKIN: No obvious rash, lesion, or ulcer.  DATA REVIEW:   CBC Recent Labs  Lab 10/28/18 0409  WBC 8.3  HGB 10.7*  HCT 33.0*  PLT 321    Chemistries  Recent Labs  Lab 10/25/18 1241  10/28/18 0409  NA 135  --  137  K 4.5  --  4.0  CL 99  --  102  CO2 26  --  27  GLUCOSE 154*   < > 103*  BUN 31*  --  26*  CREATININE 1.09   < > 0.87  CALCIUM 9.2  --  8.8*  MG 1.8  --   --   AST 13*  --   --   ALT 17  --   --   ALKPHOS 110  --   --   BILITOT 0.4  --   --    < > = values in this interval not displayed.     Microbiology Results  Results for orders placed or performed during the hospital encounter of 10/25/18  CULTURE, BLOOD (ROUTINE X 2) w Reflex to ID Panel     Status: None   Collection Time: 10/25/18 12:42 PM   Specimen: BLOOD  Result Value Ref Range Status   Specimen Description BLOOD LEFT [facility]  Final   Special Requests   Final    BOTTLES DRAWN AEROBIC AND ANAEROBIC Blood Culture adequate volume   Culture   Final    NO GROWTH 5 DAYS Performed at [facility], [address] [place], [place] [address]    Report Status 10/30/2018 FINAL  Final  CULTURE, BLOOD (ROUTINE X 2) w Reflex to ID Panel     Status: None   Collection Time: 10/25/18 12:49 PM   Specimen: BLOOD  Result Value Ref Range Status   Specimen Description BLOOD BLOOD RIGHT HAND  Final   Special Requests   Final    BOTTLES DRAWN AEROBIC AND ANAEROBIC  Blood Culture adequate volume   Culture   Final    NO GROWTH 5 DAYS Performed at [facility], [address] [place],  [address]    Report Status 10/30/2018 FINAL  Final  SARS Coronavirus 2 [facility] order, Performed in [place] hospital lab)     Status: None   Collection Time: 10/25/18  2:50 PM  Result Value Ref Range Status   SARS Coronavirus 2 NEGATIVE NEGATIVE Final    Comment: (NOTE) If result is NEGATIVE SARS-CoV-2 target nucleic acids are NOT DETECTED. The SARS-CoV-2 RNA is generally detectable in upper and lower  respiratory specimens during the acute phase of infection. The lowest  concentration of SARS-CoV-2 viral copies this assay can detect is 250  copies / mL. A negative result does not preclude SARS-CoV-2 infection  and should not be used as the sole basis for treatment or other  patient management decisions.  A negative result may occur with  improper specimen collection / handling, submission of specimen other  than nasopharyngeal swab, presence of viral mutation(s) within the  areas targeted by this assay, and inadequate number of viral copies  (<250 copies / mL). A negative result must be combined with clinical  observations, patient history, and epidemiological information. If result is POSITIVE SARS-CoV-2 target nucleic acids are DETECTED. The SARS-CoV-2 RNA is generally detectable in upper and lower  respiratory specimens dur ing the acute phase of infection.  Positive  results are indicative of active infection with SARS-CoV-2.  Clinical  correlation with patient history and other diagnostic information is  necessary to determine patient infection status.  Positive results do  not rule out bacterial infection or co-infection with other viruses. If result is PRESUMPTIVE POSTIVE SARS-CoV-2 nucleic acids MAY BE PRESENT.   A presumptive positive result was  obtained on the submitted specimen  and confirmed on repeat testing.  While 2019 novel  coronavirus  (SARS-CoV-2) nucleic acids may be present in the submitted sample  additional confirmatory testing may be necessary for epidemiological  and / or clinical management purposes  to differentiate between  SARS-CoV-2 and other Sarbecovirus currently known to infect humans.  If clinically indicated additional testing with an alternate test  methodology [phone]) is advised. The SARS-CoV-2 RNA is generally  detectable in upper and lower respiratory sp ecimens during the acute  phase of infection. The expected result is Negative. Fact Sheet for Patients:  StrictlyIdeas.no Fact Sheet for Healthcare Providers: BankingDealers.co.za This test is not yet approved or cleared by the [place] FDA and has been authorized for detection and/or diagnosis of SARS-CoV-2 by FDA under an Emergency Use Authorization (EUA).  This EUA will remain in effect (meaning this test can be used) for the duration of the COVID-19 declaration under Section 564(b)(1) of the Act, 21 U.S.C. section 360bbb-3(b)(1), unless the authorization is terminated or revoked sooner. Performed at [facility], [address], [place], [place] [address]   Surgical pcr screen     Status: None   Collection Time: 10/25/18 10:44 PM   Specimen: Nasal Mucosa; Nasal Swab  Result Value Ref Range Status   MRSA, PCR NEGATIVE NEGATIVE Final   Staphylococcus aureus NEGATIVE NEGATIVE Final    Comment: (NOTE) The Xpert SA Assay (FDA approved for NASAL specimens in patients 31 years of age and older), is one component of a comprehensive surveillance program. It is not intended to diagnose infection nor to guide or monitor treatment. Performed at [facility], [address] [place], [place] [address]   Aerobic/Anaerobic Culture (surgical/deep wound)     Status: None (Preliminary result)   Collection Time: 10/26/18  1:04 PM   Specimen: Bone; Tissue  Result  Value Ref Range Status   Specimen Description BONE LEFT FOOT  Final   Special Requests NONE  Final   Gram Stain   Final    RARE WBC PRESENT, PREDOMINANTLY PMN NO ORGANISMS SEEN Performed at [facility], [place] [address] [place], [place] [address]    Culture   Final    RARE STAPHYLOCOCCUS EPIDERMIDIS RESULT CALLED TO, READ BACK BY AND VERIFIED WITH: RN A CAMPBELL [id number] 1856 MLM NO ANAEROBES ISOLATED; CULTURE IN PROGRESS FOR 5 DAYS    Report Status PENDING  Incomplete   Organism ID, Bacteria STAPHYLOCOCCUS EPIDERMIDIS  Final      Susceptibility   Staphylococcus epidermidis - MIC*    CIPROFLOXACIN <=0.5 SENSITIVE Sensitive     ERYTHROMYCIN >=8 RESISTANT Resistant     GENTAMICIN <=0.5 SENSITIVE Sensitive     OXACILLIN >=4 RESISTANT Resistant     TETRACYCLINE <=1 SENSITIVE Sensitive     VANCOMYCIN 2 SENSITIVE Sensitive     TRIMETH/SULFA 160 RESISTANT Resistant     CLINDAMYCIN <=0.25 SENSITIVE Sensitive     RIFAMPIN <=0.5 SENSITIVE Sensitive     Inducible Clindamycin NEGATIVE Sensitive     * RARE STAPHYLOCOCCUS EPIDERMIDIS    RADIOLOGY:  [place] Ekg Site Rite  Result Date: 10/30/2018 If Site Rite image not attached, placement could not be confirmed due to current cardiac rhythm.    Management plans discussed with the patient, family and they are in agreement.  CODE STATUS: Full Code   TOTAL TIME TAKING CARE OF THIS PATIENT: 36 minutes.    Demetrios Loll M.D on 10/30/2018 at 1:36 PM  Between 7am  to 6pm - Pager - [phone]  After 6pm go to [email or web address] - Technical brewer [facility]  Office  [phone]  CC: Primary care physician; Leone Haven, MD   Note: This dictation was prepared with Dragon dictation along with smaller phrase technology. Any transcriptional errors that result from this process are unintentional.

## 2018-10-30 NOTE — TOC Progression Note (Signed)
Transition of Care [facility]) - Progression Note    Patient Details  Name: Joseph Singh MRN: [id number] Date of Birth: 1960-05-02  Transition of [facility]) CM/SW Contact  Tivon Lemoine, Lenice Llamas Phone Number: [phone]  10/30/2018, 2:59 PM  Clinical Narrative: The following home health agencies declined referral: [facility], [facility], [facility], [facility], [facility], [facility], [facility], [facility] and [facility].  Clinical Social Worker (CSW) met with patient and made him aware of above. CSW explained SNF option. Patient reported that he prefers to go home and wants to talk to his wife [name] about D/C plan.   CSW contacted Dr. Vickki Muff who reported that patient's wife can do dry dressing changes and patient does not need wound care.   Per [facility] representative they can accept patient for home IV ABX. Per Pam she will arrange for [facility] home health to provide nursing care for the PICC line for patient. Pam reported that she will contact patient's wife. Per [place] [facility] agency representative bedside commode will be delivered to patient's home today. Patient and his wife are aware of above. Per patient and wife patient does not need PT right now and patient reported that he will use scooter for mobility. Please reconsult if future social work needs arise. CSW signing off.     Expected Discharge Plan: [place] Barriers to Discharge: Continued Medical Work up  Expected Discharge Plan and Services Expected Discharge Plan: [place] In-house Referral: Clinical Social Work Discharge Planning Services: CM Consult Post Acute Care Choice: [place] arrangements for the past 2 months: Single Family Home Expected Discharge Date: 10/30/18               DME Arranged: Bedside commode DME Agency: [facility] Date DME Agency Contacted: 10/29/18 Time DME Agency Contacted: [phone] Representative spoke with at DME Agency: [place]: PT, RN [place]  Agency: [facility] Date [place]: 10/29/18 Time [place]: 1333 Representative spoke with at [place]: [place] ([place]) Interventions    Readmission Risk Interventions No flowsheet data found.

## 2018-10-30 NOTE — Progress Notes (Signed)
Peripherally Inserted Central Catheter/Midline Placement  The IV Nurse has discussed with the patient and/or persons authorized to consent for the patient, the purpose of this procedure and the potential benefits and risks involved with this procedure.  The benefits include less needle sticks, lab draws from the catheter, and the patient may be discharged home with the catheter. Risks include, but not limited to, infection, bleeding, blood clot (thrombus formation), and puncture of an artery; nerve damage and irregular heartbeat and possibility to perform a PICC exchange if needed/ordered by physician.  Alternatives to this procedure were also discussed.  Bard Power PICC patient education guide, fact sheet on infection prevention and patient information card has been provided to patient /or left at bedside.    PICC/Midline Placement Documentation  PICC Single Lumen 45/36/46 PICC Right Basilic 48 cm 0 cm (Active)  Indication for Insertion or Continuance of Line Home intravenous therapies (PICC only) 10/30/18 1810  Exposed Catheter (cm) 0 cm 10/30/18 1810  Site Assessment Clean;Dry;Intact 10/30/18 1810  Line Status Flushed;Saline locked;Blood return noted 10/30/18 1810  Dressing Type Transparent;Securing device 10/30/18 1810  Dressing Status Clean;Dry;Intact;Antimicrobial disc in place 10/30/18 1810  Dressing Intervention New dressing 10/30/18 1810  Dressing Change Due 11/06/18 10/30/18 1810       Enos Fling 10/30/2018, 6:11 PM

## 2018-10-31 ENCOUNTER — Encounter: Payer: Self-pay | Admitting: Family Medicine

## 2018-10-31 ENCOUNTER — Other Ambulatory Visit: Payer: Self-pay | Admitting: Family Medicine

## 2018-10-31 DIAGNOSIS — A4902 Methicillin resistant Staphylococcus aureus infection, unspecified site: Secondary | ICD-10-CM | POA: Diagnosis not present

## 2018-10-31 LAB — SURGICAL PATHOLOGY

## 2018-11-01 LAB — AEROBIC/ANAEROBIC CULTURE W GRAM STAIN (SURGICAL/DEEP WOUND)

## 2018-11-01 NOTE — Telephone Encounter (Signed)
Looks like appt was already cancelled for 11/02/18

## 2018-11-02 ENCOUNTER — Ambulatory Visit: Payer: Medicare Other | Admitting: Family Medicine

## 2018-11-06 ENCOUNTER — Ambulatory Visit (INDEPENDENT_AMBULATORY_CARE_PROVIDER_SITE_OTHER): Payer: Medicare Other | Admitting: Family Medicine

## 2018-11-06 ENCOUNTER — Other Ambulatory Visit: Payer: Self-pay

## 2018-11-06 ENCOUNTER — Encounter: Payer: Self-pay | Admitting: Family Medicine

## 2018-11-06 ENCOUNTER — Other Ambulatory Visit
Admission: RE | Admit: 2018-11-06 | Discharge: 2018-11-06 | Disposition: A | Discharge status: Home / Self Care | Payer: Medicare Other | Source: Other Acute Inpatient Hospital | Attending: Infectious Diseases | Admitting: Infectious Diseases

## 2018-11-06 VITALS — BP 112/64 | HR 71 | Temp 98.3°F | Resp 18 | Ht 76.0 in | Wt 297.1 lb

## 2018-11-06 DIAGNOSIS — I739 Peripheral vascular disease, unspecified: Secondary | ICD-10-CM | POA: Diagnosis not present

## 2018-11-06 DIAGNOSIS — M869 Osteomyelitis, unspecified: Secondary | ICD-10-CM | POA: Insufficient documentation

## 2018-11-06 DIAGNOSIS — E1151 Type 2 diabetes mellitus with diabetic peripheral angiopathy without gangrene: Secondary | ICD-10-CM | POA: Diagnosis not present

## 2018-11-06 DIAGNOSIS — H60501 Unspecified acute noninfective otitis externa, right ear: Secondary | ICD-10-CM

## 2018-11-06 DIAGNOSIS — L97522 Non-pressure chronic ulcer of other part of left foot with fat layer exposed: Secondary | ICD-10-CM | POA: Diagnosis not present

## 2018-11-06 DIAGNOSIS — A4902 Methicillin resistant Staphylococcus aureus infection, unspecified site: Secondary | ICD-10-CM | POA: Diagnosis not present

## 2018-11-06 DIAGNOSIS — H6591 Unspecified nonsuppurative otitis media, right ear: Secondary | ICD-10-CM

## 2018-11-06 DIAGNOSIS — IMO0002 Reserved for concepts with insufficient information to code with codable children: Secondary | ICD-10-CM

## 2018-11-06 DIAGNOSIS — E1165 Type 2 diabetes mellitus with hyperglycemia: Secondary | ICD-10-CM

## 2018-11-06 DIAGNOSIS — I10 Essential (primary) hypertension: Secondary | ICD-10-CM | POA: Diagnosis not present

## 2018-11-06 DIAGNOSIS — B9562 Methicillin resistant Staphylococcus aureus infection as the cause of diseases classified elsewhere: Secondary | ICD-10-CM | POA: Insufficient documentation

## 2018-11-06 DIAGNOSIS — E785 Hyperlipidemia, unspecified: Secondary | ICD-10-CM

## 2018-11-06 LAB — COMPREHENSIVE METABOLIC PANEL
ALT: 39 U/L (ref 0–44)
AST: 23 U/L (ref 15–41)
Albumin: 3.3 g/dL — ABNORMAL LOW (ref 3.5–5.0)
Alkaline Phosphatase: 74 U/L (ref 38–126)
Anion gap: 9 (ref 5–15)
BUN: 16 mg/dL (ref 6–20)
CO2: 30 mmol/L (ref 22–32)
Calcium: 9.3 mg/dL (ref 8.9–10.3)
Chloride: 97 mmol/L — ABNORMAL LOW (ref 98–111)
Creatinine, Ser: 0.84 mg/dL (ref 0.61–1.24)
GFR calc Af Amer: >60 mL/min (ref >60)
GFR calc non Af Amer: >60 mL/min (ref >60)
Glucose, Bld: 203 mg/dL — ABNORMAL HIGH (ref 70–99)
Potassium: 4.3 mmol/L (ref 3.5–5.1)
Sodium: 136 mmol/L (ref 135–145)
Total Bilirubin: 0.5 mg/dL (ref 0.3–1.2)
Total Protein: 6.4 g/dL — ABNORMAL LOW (ref 6.5–8.1)

## 2018-11-06 LAB — CBC
HCT: 37.8 % — ABNORMAL LOW (ref 39.0–52.0)
Hemoglobin: 12 g/dL — ABNORMAL LOW (ref 13.0–17.0)
MCH: 29 pg (ref 26.0–34.0)
MCHC: 31.7 g/dL (ref 30.0–36.0)
MCV: 91.3 fL (ref 80.0–100.0)
Platelets: 317 10*3/uL (ref 150–400)
RBC: 4.14 MIL/uL — ABNORMAL LOW (ref 4.22–5.81)
RDW: 14.9 % (ref 11.5–15.5)
WBC: 7.8 10*3/uL (ref 4.0–10.5)
nRBC: 0 % (ref 0.0–0.2)

## 2018-11-06 LAB — CBC WITH DIFFERENTIAL/PLATELET
Abs Immature Granulocytes: 0.06 10*3/uL (ref 0.00–0.07)
Basophils Absolute: 0.1 10*3/uL (ref 0.0–0.1)
Basophils Relative: 1 %
Eosinophils Absolute: 0.2 10*3/uL (ref 0.0–0.5)
Eosinophils Relative: 2 %
HCT: 37.2 % — ABNORMAL LOW (ref 39.0–52.0)
Hemoglobin: 12.1 g/dL — ABNORMAL LOW (ref 13.0–17.0)
Immature Granulocytes: 1 %
Lymphocytes Relative: 23 %
Lymphs Abs: 1.8 10*3/uL (ref 0.7–4.0)
MCH: 29.5 pg (ref 26.0–34.0)
MCHC: 32.5 g/dL (ref 30.0–36.0)
MCV: 90.7 fL (ref 80.0–100.0)
Monocytes Absolute: 0.5 10*3/uL (ref 0.1–1.0)
Monocytes Relative: 7 %
Neutro Abs: 5 10*3/uL (ref 1.7–7.7)
Neutrophils Relative %: 66 %
Platelets: 327 10*3/uL (ref 150–400)
RBC: 4.1 MIL/uL — ABNORMAL LOW (ref 4.22–5.81)
RDW: 14.8 % (ref 11.5–15.5)
WBC: 7.5 10*3/uL (ref 4.0–10.5)
nRBC: 0 % (ref 0.0–0.2)

## 2018-11-06 LAB — VANCOMYCIN, TROUGH: Vancomycin Tr: 10 ug/mL — ABNORMAL LOW (ref 15–20)

## 2018-11-06 MED ORDER — LISINOPRIL-HYDROCHLOROTHIAZIDE 10-12.5 MG PO TABS: 1.0000 | ORAL_TABLET | Freq: Every day | ORAL | 3 refills | Status: DC

## 2018-11-06 MED ORDER — AMLODIPINE BESYLATE 10 MG PO TABS: 10.0000 mg | ORAL_TABLET | Freq: Every day | ORAL | 3 refills | Status: DC

## 2018-11-06 MED ORDER — METOPROLOL SUCCINATE ER 50 MG PO TB24: 50.0000 mg | ORAL_TABLET | Freq: Every day | ORAL | 3 refills | Status: DC

## 2018-11-06 MED ORDER — LINAGLIPTIN 5 MG PO TABS: 5.0000 mg | ORAL_TABLET | Freq: Every day | ORAL | 1 refills | Status: DC

## 2018-11-06 MED ORDER — CIPRODEX 0.3-0.1 % OT SUSP: 4.0000 [drp] | Freq: Two times a day (BID) | OTIC | 0 refills | Status: DC

## 2018-11-06 NOTE — Progress Notes (Signed)
Subjective:    Patient ID: Joseph Singh, male    DOB: 1960-09-24, 58 y.o.   MRN: [id number]  HPI   Patient presents to clinic for hospital follow up. Hospital H&P and discharge summary reviewed via epic and copied into chart, colored in GREEN.  PATIENT NAME: Joseph Singh    MR#:  [id number]  DATE OF BIRTH:  1960-09-24  DATE OF ADMISSION:  10/25/2018        ADMITTING PHYSICIAN: Campbell StallKaty Dodd Mayo, MD  DATE OF DISCHARGE: 10/30/2018  PRIMARY CARE PHYSICIAN: [name], [name], MD   ADMISSION DIAGNOSIS:  gangrene left foot DISCHARGE DIAGNOSIS:  Active Problems:   Osteomyelitis of foot [name]  SECONDARY DIAGNOSIS:       Past Medical History:  Diagnosis Date  . Arthritis   . Diabetes mellitus without complication [name]   . History of kidney stones    h/o  . Hyperlipidemia   . Hypertension   . Neuropathy    HOSPITAL COURSE:  Osteomyelitis of foot [name]  58 y.o.malewith past medical history of diabetes mellitus, kidney stones, hyperlipidemia, hypertension, neuropathy, nonhealing ulceration on the left foot, former smoker,PAD with recent revascularization by vascular surgery and recent diagnosis of right ear infection with bilateral effusion presenting as a direct admit from podiatry clinic with left foot osteomyelitis.  1.Left foot osteomyelitis-x-ray shows obvious erosive change consistent with osteomyelitis to the lateral left foot -Negative blood cultures -Reviewed MRI left foot to evaluate the extent of the infection -empiric antibiotics with vancomycin -Dr. [name] had done amputation of fourth metatarsal bone and cuboid bone on left. Appreciate help by ID, able to de-escalate antibiotics soon. The skin culture reports are back but not from the bone during the surgery so ID suggested to continue the same antibiotics for now as patient had MRSA in the past. Bone culture report staph epidermidis. Continue IV vancomycin and Rocephin via PICC line  until October 16 per Dr. Rivka Saferavishankar. Continue wound care.  2.Right ear infection -Patient was on doxycycline.  Antibiotics as above.   3.Diabetes Mellitus Type 2 with complications - Recent HgbA1c 7.6. Goal < 7.0 -Checked hemoglobin A1c-now 8.1 - Hold metformin,Jardiance,Tradjenta,and glipizide for now - CBG monitoring - SSI - DM education and close PCP follow up Resumed his glipizide and metformin.  Resume other home medication.  4.HTN  + Goal BP <130/80 He was on amlodipine, lisinopril-HCTZ and metoprolol. Blood pressure has been borderline soft. Stopped amlodipine and held lisinopril and metoprolol. Pt was eating lot of salt at home, here in hospital BP is normal without any meds- advised that he may not need any BP meds if follow dietary advise. Follow-up PCP to resume.  5.HLD  + Goal LDL<100 - Atorvastatin40mg  PO qhs  6.PAD with recent revascularization per vascular -Continue aspirin and Plavix   Social History   Tobacco Use  . Smoking status: Former Smoker    Packs/day: 0.50    Years: 35.00    Pack years: 17.50    Types: Cigarettes    Quit date: 08/09/2018    Years since quitting: 0.2  . Smokeless tobacco: Never Used  Substance Use Topics  . Alcohol use: No    Alcohol/week: 0.0 standard drinks   Past Surgical History:  Procedure Laterality Date  . AMPUTATION TOE Left 08/10/2018   Procedure: RAY LEFT;  Surgeon: Gwyneth RevelsFowler, Justin, DPM;  Location: [facility];  Service: Podiatry;  Laterality: Left;  . IRRIGATION AND DEBRIDEMENT FOOT Left 10/26/2018   Procedure: IRRIGATION AND DEBRIDEMENT FOOT, LEFT;  Surgeon: Gwyneth Revels, DPM;  Location: [facility];  Service: Podiatry;  Laterality: Left;  . LOWER EXTREMITY ANGIOGRAPHY Right 02/25/2016   Procedure: Lower Extremity Angiography;  Surgeon: Annice Needy, MD;  Location: [facility];  Service: Cardiovascular;  Laterality: Right;  . LOWER EXTREMITY ANGIOGRAPHY Right 02/27/2017   Procedure: LOWER  EXTREMITY ANGIOGRAPHY;  Surgeon: Annice Needy, MD;  Location: [facility];  Service: Cardiovascular;  Laterality: Right;  . LOWER EXTREMITY ANGIOGRAPHY Left 08/06/2018   Procedure: LOWER EXTREMITY ANGIOGRAPHY;  Surgeon: Annice Needy, MD;  Location: [facility];  Service: Cardiovascular;  Laterality: Left;  . LOWER EXTREMITY INTERVENTION  02/25/2016   Procedure: Lower Extremity Intervention;  Surgeon: Annice Needy, MD;  Location: [facility];  Service: Cardiovascular;;  . TOE AMPUTATION      RECENT LABS REVIEWED: CBC Latest Ref Rng & Units 10/28/2018 10/25/2018 02/23/2018  WBC 4.0 - 10.5 K/uL 8.3 8.1 8.9  Hemoglobin 13.0 - 17.0 g/dL 10.7(L) 12.4(L) 16.2  Hematocrit 39.0 - 52.0 % 33.0(L) 38.2(L) 47.6  Platelets 150 - 400 K/uL 321 404(H) 267.0   BMP Latest Ref Rng & Units 10/28/2018 10/27/2018 10/26/2018  Glucose 70 - 99 mg/dL 096(G) - 836(O)  BUN 6 - 20 mg/dL 29(U) - -  Creatinine 7.65 - 1.24 mg/dL 4.65 0.35 4.65  Sodium 135 - 145 mmol/L 137 - -  Potassium 3.5 - 5.1 mmol/L 4.0 - -  Chloride 98 - 111 mmol/L 102 - -  CO2 22 - 32 mmol/L 27 - -  Calcium 8.9 - 10.3 mg/dL 6.8(L) - -   Lab Results  Component Value Date   HGBA1C 8.1 (H) 10/25/2018    Overall patient doing well since returning home.  Has PICC line in right upper extremity, getting vancomycin infusions.  Home health nurses are coming to do dressing changes and monitor PICC line site.  They are able to draw his lab work right off the PICC line, they are checking labs twice a week to monitor vancomycin levels and kidney functions.  He is eating and drinking normally.  He does have appointment with Dr. Ether Griffins after today's appointment here for follow-up on left foot.   States 2 nights ago, he was having some irritating feelings in right ear and noticed a little bit of dried blood in ear canal.  Review of Systems  Constitutional: Negative for chills, fatigue and fever. In wheelchair due to being non weight bearing on  his left foot as of now.  HENT:Negative for congestion, sinus pain and sore throat.  ?blood right ear canal Eyes: Negative.   Respiratory: Negative for cough, shortness of breath and wheezing.   Cardiovascular: Negative for chest pain, palpitations and leg swelling.  Gastrointestinal: Negative for abdominal pain, diarrhea, nausea and vomiting.  Genitourinary: Negative for dysuria, frequency and urgency.  Musculoskeletal: Negative for arthralgias and myalgias.  Skin: Negative for color change, pallor and rash.  Neurological: Negative for syncope, light-headedness and headaches.  Psychiatric/Behavioral: The patient is not nervous/anxious.       Objective:   Physical Exam Vitals signs and nursing note reviewed.  Constitutional:      General: He is not in acute distress.    Appearance: He is not ill-appearing, toxic-appearing or diaphoretic.  HENT:     Head: Normocephalic and atraumatic.     Ears:     Comments: Redness and some clear drainage in right ear canal  Hearing for conversational voice normal  TMs are not  red or bulging. Eyes:     General: No scleral icterus.    Extraocular Movements: Extraocular movements intact.     Pupils: Pupils are equal, round, and reactive to light.  Neck:     Musculoskeletal: Normal range of motion and neck supple. No neck rigidity.  Cardiovascular:     Rate and Rhythm: Normal rate and regular rhythm.  Pulmonary:     Effort: Pulmonary effort is normal. No respiratory distress.     Breath sounds: Normal breath sounds.  Skin:    Comments: Dressing on left foot, seeing Dr Vickki Muff for f/u today  Neurological:     Mental Status: He is alert and oriented to person, place, and time.  Psychiatric:        Mood and Affect: Mood normal.        Behavior: Behavior normal.     Vitals:   11/06/18 1313  BP: 112/64  Pulse: 71  Resp: 18  Temp: 98.3 F (36.8 C)  SpO2: 92%       Assessment & Plan:    1. Right otitis media with effusion TMs look  improved. Ear canal appears irriated - will do ear drops to treat - ciprofloxacin-dexamethasone (CIPRODEX) OTIC suspension; Place 4 drops into the right ear 2 (two) times daily. For 7 days  Dispense: 7.5 mL; Refill: 0  2. Essential hypertension BP stable today. He did resume amlodipine, metoprolol and lisinopril HCTZ upon hospital discharge. He will check BP daily and keep log.  His BP dropped while admitted, so meds were held.   3. DM (diabetes mellitus) type II uncontrolled, periph vascular disorder ([place]) Will resume all diabetes medications he took before hospital. Will monitor BS. Goal sugars are between 80-130. Discussed healthy diet and good water intake.   4. Osteomyelitis of left foot Seeing Dr Vickki Muff today, nurses coming 2x per week for labs, vancomycin through picc line  5. Peripheral vascular disease ([place]) Follows with vascular. Continua ASA and plavix  6. Hyperlipidemia, unspecified hyperlipidemia type Continue statin  7. Acute otitis externa of right ear, unspecified type, Otitis media  - ciprofloxacin-dexamethasone (CIPRODEX) OTIC suspension; Place 4 drops into the right ear 2 (two) times daily. For 7 days  Dispense: 7.5 mL; Refill: 0   Patient will continue regular follow-ups with specialist and keep PCP appointment as planned is December 2020.  Patient aware he can call office anytime with questions or concerns.  Medication refills that are needed were given.

## 2018-11-06 NOTE — Patient Instructions (Signed)
Resume all home medications.

## 2018-11-07 ENCOUNTER — Encounter: Payer: Self-pay | Admitting: Family Medicine

## 2018-11-07 DIAGNOSIS — A4902 Methicillin resistant Staphylococcus aureus infection, unspecified site: Secondary | ICD-10-CM | POA: Diagnosis not present

## 2018-11-07 NOTE — Telephone Encounter (Signed)
Yes that is the right dose  Last med list in hospital had him on 20-25, taking 1/2 tablet   So I sent in the actual dose of what the 1/2 tablet is

## 2018-11-08 ENCOUNTER — Other Ambulatory Visit (HOSPITAL_COMMUNITY)
Admission: RE | Admit: 2018-11-08 | Discharge: 2018-11-08 | Disposition: A | Discharge status: Home / Self Care | Payer: Medicare Other | Source: Ambulatory Visit | Attending: Infectious Diseases | Admitting: Infectious Diseases

## 2018-11-08 DIAGNOSIS — B9562 Methicillin resistant Staphylococcus aureus infection as the cause of diseases classified elsewhere: Secondary | ICD-10-CM | POA: Diagnosis not present

## 2018-11-08 DIAGNOSIS — A4902 Methicillin resistant Staphylococcus aureus infection, unspecified site: Secondary | ICD-10-CM | POA: Diagnosis not present

## 2018-11-08 LAB — BASIC METABOLIC PANEL
Anion gap: 10 (ref 5–15)
BUN: 17 mg/dL (ref 6–20)
CO2: 27 mmol/L (ref 22–32)
Calcium: 8.8 mg/dL — ABNORMAL LOW (ref 8.9–10.3)
Chloride: 98 mmol/L (ref 98–111)
Creatinine, Ser: 0.86 mg/dL (ref 0.61–1.24)
GFR calc Af Amer: >60 mL/min (ref >60)
GFR calc non Af Amer: >60 mL/min (ref >60)
Glucose, Bld: 155 mg/dL — ABNORMAL HIGH (ref 70–99)
Potassium: 4.1 mmol/L (ref 3.5–5.1)
Sodium: 135 mmol/L (ref 135–145)

## 2018-11-08 LAB — VANCOMYCIN, TROUGH: Vancomycin Tr: 11 ug/mL — ABNORMAL LOW (ref 15–20)

## 2018-11-12 DIAGNOSIS — A4902 Methicillin resistant Staphylococcus aureus infection, unspecified site: Secondary | ICD-10-CM | POA: Diagnosis not present

## 2018-11-12 DIAGNOSIS — B9562 Methicillin resistant Staphylococcus aureus infection as the cause of diseases classified elsewhere: Secondary | ICD-10-CM | POA: Diagnosis not present

## 2018-11-13 ENCOUNTER — Other Ambulatory Visit: Payer: Self-pay | Admitting: Family Medicine

## 2018-11-14 DIAGNOSIS — L97422 Non-pressure chronic ulcer of left heel and midfoot with fat layer exposed: Secondary | ICD-10-CM | POA: Diagnosis not present

## 2018-11-14 DIAGNOSIS — T8131XD Disruption of external operation (surgical) wound, not elsewhere classified, subsequent encounter: Secondary | ICD-10-CM | POA: Diagnosis not present

## 2018-11-14 DIAGNOSIS — L97522 Non-pressure chronic ulcer of other part of left foot with fat layer exposed: Secondary | ICD-10-CM | POA: Diagnosis not present

## 2018-11-15 ENCOUNTER — Other Ambulatory Visit (HOSPITAL_COMMUNITY)
Admission: RE | Admit: 2018-11-15 | Discharge: 2018-11-15 | Disposition: A | Discharge status: Home / Self Care | Payer: Medicare Other | Source: Ambulatory Visit | Attending: Infectious Diseases | Admitting: Infectious Diseases

## 2018-11-15 DIAGNOSIS — A4902 Methicillin resistant Staphylococcus aureus infection, unspecified site: Secondary | ICD-10-CM | POA: Diagnosis not present

## 2018-11-15 DIAGNOSIS — M869 Osteomyelitis, unspecified: Secondary | ICD-10-CM | POA: Diagnosis not present

## 2018-11-15 DIAGNOSIS — E11621 Type 2 diabetes mellitus with foot ulcer: Secondary | ICD-10-CM | POA: Diagnosis not present

## 2018-11-15 LAB — BASIC METABOLIC PANEL
Anion gap: 11 (ref 5–15)
BUN: 18 mg/dL (ref 6–20)
CO2: 26 mmol/L (ref 22–32)
Calcium: 8.9 mg/dL (ref 8.9–10.3)
Chloride: 99 mmol/L (ref 98–111)
Creatinine, Ser: 0.89 mg/dL (ref 0.61–1.24)
GFR calc Af Amer: >60 mL/min (ref >60)
GFR calc non Af Amer: >60 mL/min (ref >60)
Glucose, Bld: 211 mg/dL — ABNORMAL HIGH (ref 70–99)
Potassium: 4.1 mmol/L (ref 3.5–5.1)
Sodium: 136 mmol/L (ref 135–145)

## 2018-11-15 LAB — VANCOMYCIN, TROUGH: Vancomycin Tr: 18 ug/mL (ref 15–20)

## 2018-11-16 DIAGNOSIS — A4902 Methicillin resistant Staphylococcus aureus infection, unspecified site: Secondary | ICD-10-CM | POA: Diagnosis not present

## 2018-11-17 ENCOUNTER — Other Ambulatory Visit: Payer: Self-pay | Admitting: Family Medicine

## 2018-11-17 DIAGNOSIS — G8929 Other chronic pain: Secondary | ICD-10-CM

## 2018-11-18 ENCOUNTER — Other Ambulatory Visit: Payer: Self-pay | Admitting: Family Medicine

## 2018-11-18 DIAGNOSIS — G8929 Other chronic pain: Secondary | ICD-10-CM

## 2018-11-19 ENCOUNTER — Other Ambulatory Visit (HOSPITAL_COMMUNITY)
Admission: RE | Admit: 2018-11-19 | Discharge: 2018-11-19 | Disposition: A | Discharge status: Home / Self Care | Payer: Medicare Other | Source: Ambulatory Visit | Attending: Infectious Diseases | Admitting: Infectious Diseases

## 2018-11-19 DIAGNOSIS — A4902 Methicillin resistant Staphylococcus aureus infection, unspecified site: Secondary | ICD-10-CM | POA: Diagnosis not present

## 2018-11-19 DIAGNOSIS — M869 Osteomyelitis, unspecified: Secondary | ICD-10-CM | POA: Insufficient documentation

## 2018-11-19 LAB — COMPREHENSIVE METABOLIC PANEL
ALT: 22 U/L (ref 0–44)
AST: 18 U/L (ref 15–41)
Albumin: 3.3 g/dL — ABNORMAL LOW (ref 3.5–5.0)
Alkaline Phosphatase: 79 U/L (ref 38–126)
Anion gap: 12 (ref 5–15)
BUN: 13 mg/dL (ref 6–20)
CO2: 25 mmol/L (ref 22–32)
Calcium: 9 mg/dL (ref 8.9–10.3)
Chloride: 98 mmol/L (ref 98–111)
Creatinine, Ser: 0.72 mg/dL (ref 0.61–1.24)
GFR calc Af Amer: >60 mL/min (ref >60)
GFR calc non Af Amer: >60 mL/min (ref >60)
Glucose, Bld: 149 mg/dL — ABNORMAL HIGH (ref 70–99)
Potassium: 3.9 mmol/L (ref 3.5–5.1)
Sodium: 135 mmol/L (ref 135–145)
Total Bilirubin: 0.7 mg/dL (ref 0.3–1.2)
Total Protein: 6.4 g/dL — ABNORMAL LOW (ref 6.5–8.1)

## 2018-11-19 LAB — CBC WITH DIFFERENTIAL/PLATELET
Abs Immature Granulocytes: 0.04 10*3/uL (ref 0.00–0.07)
Basophils Absolute: 0 10*3/uL (ref 0.0–0.1)
Basophils Relative: 1 %
Eosinophils Absolute: 0.1 10*3/uL (ref 0.0–0.5)
Eosinophils Relative: 2 %
HCT: 39 % (ref 39.0–52.0)
Hemoglobin: 12.1 g/dL — ABNORMAL LOW (ref 13.0–17.0)
Immature Granulocytes: 1 %
Lymphocytes Relative: 21 %
Lymphs Abs: 1.3 10*3/uL (ref 0.7–4.0)
MCH: 29.7 pg (ref 26.0–34.0)
MCHC: 31 g/dL (ref 30.0–36.0)
MCV: 95.8 fL (ref 80.0–100.0)
Monocytes Absolute: 0.6 10*3/uL (ref 0.1–1.0)
Monocytes Relative: 10 %
Neutro Abs: 4 10*3/uL (ref 1.7–7.7)
Neutrophils Relative %: 65 %
Platelets: 264 10*3/uL (ref 150–400)
RBC: 4.07 MIL/uL — ABNORMAL LOW (ref 4.22–5.81)
RDW: 16.2 % — ABNORMAL HIGH (ref 11.5–15.5)
WBC: 6.1 10*3/uL (ref 4.0–10.5)
nRBC: 0 % (ref 0.0–0.2)

## 2018-11-20 ENCOUNTER — Ambulatory Visit: Payer: Medicare Other | Attending: Infectious Diseases | Admitting: Infectious Diseases

## 2018-11-20 ENCOUNTER — Other Ambulatory Visit: Payer: Self-pay

## 2018-11-20 DIAGNOSIS — E11628 Type 2 diabetes mellitus with other skin complications: Secondary | ICD-10-CM | POA: Diagnosis not present

## 2018-11-20 DIAGNOSIS — M86172 Other acute osteomyelitis, left ankle and foot: Secondary | ICD-10-CM | POA: Diagnosis not present

## 2018-11-20 DIAGNOSIS — B957 Other staphylococcus as the cause of diseases classified elsewhere: Secondary | ICD-10-CM

## 2018-11-20 DIAGNOSIS — E1169 Type 2 diabetes mellitus with other specified complication: Secondary | ICD-10-CM | POA: Diagnosis not present

## 2018-11-20 DIAGNOSIS — Z79899 Other long term (current) drug therapy: Secondary | ICD-10-CM

## 2018-11-20 DIAGNOSIS — Z95828 Presence of other vascular implants and grafts: Secondary | ICD-10-CM

## 2018-11-20 DIAGNOSIS — Z792 Long term (current) use of antibiotics: Secondary | ICD-10-CM

## 2018-11-20 DIAGNOSIS — L089 Local infection of the skin and subcutaneous tissue, unspecified: Secondary | ICD-10-CM | POA: Diagnosis not present

## 2018-11-20 DIAGNOSIS — I10 Essential (primary) hypertension: Secondary | ICD-10-CM

## 2018-11-20 DIAGNOSIS — Z87891 Personal history of nicotine dependence: Secondary | ICD-10-CM

## 2018-11-20 NOTE — Progress Notes (Signed)
The purpose of this virtual visit is to provide medical care while limiting exposure to the novel coronavirus (COVID19) for both patient and office staff.   Consent was obtained for Telemedicine visit:  Yes.   Answered questions that patient had about telehealth interaction:  Yes.   I discussed the limitations, risks, security and privacy concerns of performing an evaluation and management service by telephone. I also discussed with the patient that there may be a patient responsible charge related to this service. The patient expressed understanding and agreed to proceed.   Patient Location: Home Provider Location:office ? Joseph Singh is a 58 y.o. male with a history of DM, Diabetic foot infection was recently [facility] between 10/25/18 until 10/30/18 for left foot infection. He underwent excision of the 5th met base, entire 4th metatarsal and excision of the entire cuboid bone of the left foot. Cultures only showed staph epidermidis and bone pathology showed acute osteomyelitis of the 4th and 5th metatarsal bone. Pt was discharged on IV vanco and IV ceftriaxone to complete 6 weeks of Rx on 12/07/2018. He is doing well Taking his IV meds -100% adherent. No fever or chills, no rash, no diarrhea No pain foot Wound healing well.dressing changed by his wife Has an appt to see Dr.Fowler this thursday for suture removal  Past Medical History:  Diagnosis Date  . Arthritis   . Diabetes mellitus without complication ([place])   . History of kidney stones    h/o  . Hyperlipidemia   . Hypertension   . Neuropathy     Past Surgical History:  Procedure Laterality Date  . AMPUTATION TOE Left 08/10/2018   Procedure: RAY LEFT;  Surgeon: Samara Deist, DPM;  Location: [facility];  Service: Podiatry;  Laterality: Left;  . IRRIGATION AND DEBRIDEMENT FOOT Left 10/26/2018   Procedure: IRRIGATION AND DEBRIDEMENT FOOT, LEFT;  Surgeon: Samara Deist, DPM;  Location: [facility];  Service: Podiatry;  Laterality: Left;   . LOWER EXTREMITY ANGIOGRAPHY Right 02/25/2016   Procedure: Lower Extremity Angiography;  Surgeon: Algernon Huxley, MD;  Location: [place] [facility];  Service: Cardiovascular;  Laterality: Right;  . LOWER EXTREMITY ANGIOGRAPHY Right 02/27/2017   Procedure: LOWER EXTREMITY ANGIOGRAPHY;  Surgeon: Algernon Huxley, MD;  Location: [place] [facility];  Service: Cardiovascular;  Laterality: Right;  . LOWER EXTREMITY ANGIOGRAPHY Left 08/06/2018   Procedure: LOWER EXTREMITY ANGIOGRAPHY;  Surgeon: Algernon Huxley, MD;  Location: [place] [facility];  Service: Cardiovascular;  Laterality: Left;  . LOWER EXTREMITY INTERVENTION  02/25/2016   Procedure: Lower Extremity Intervention;  Surgeon: Algernon Huxley, MD;  Location: [place] [facility];  Service: Cardiovascular;;  . TOE AMPUTATION      Social History   Socioeconomic History  . Marital status: Married    Spouse name: Not on file  . Number of children: Not on file  . Years of education: Not on file  . Highest education level: Not on file  Occupational History  . Not on file  Social Needs  . Financial resource strain: Not hard at all  . Food insecurity    Worry: Never true    Inability: Never true  . Transportation needs    Medical: No    Non-medical: No  Tobacco Use  . Smoking status: Former Smoker    Packs/day: 0.50    Years: 35.00    Pack years: 17.50    Types: Cigarettes    Quit date: 08/09/2018    Years since quitting: 0.2  .  Smokeless tobacco: Never Used  Substance and Sexual Activity  . Alcohol use: No    Alcohol/week: 0.0 standard drinks  . Drug use: No  . Sexual activity: Yes    Partners: Female    Comment: Wife  Lifestyle  . Physical activity    Days per week: 3 days    Minutes per session: 20 min  . Stress: Not at all  Relationships  . Social Herbalist on phone: Not on file    Gets together: Not on file    Attends religious service: Not on file    Active member of club or organization: Not on file    Attends  meetings of clubs or organizations: Not on file    Relationship status: Married  . Intimate partner violence    Fear of current or ex partner: No    Emotionally abused: No    Physically abused: No    Forced sexual activity: No  Other Topics Concern  . Not on file  Social History Narrative   Permanently disabled   [place] on the job and hurt his shoulder    Lives with wife and 2 children    1 grandchild from a previous marriage    Pets: Not inside    GED    Right handed    Caffeine- 2-3 sodas, tea, no coffee    Enjoys watching tv and being outside     Family History  Problem Relation Age of Onset  . Diabetes Mother   . Heart disease Father   . Diabetes Sister   . Diabetes Brother   . Cancer Sister        lung  . Cancer Sister        breast   No Known Allergies  ? Current Outpatient Medications  Medication Sig Dispense Refill  . amLODipine (NORVASC) 10 MG tablet Take 1 tablet (10 mg total) by mouth daily. 90 tablet 3  . aspirin EC 81 MG tablet Take 1 tablet (81 mg total) by mouth daily. 150 tablet 2  . atorvastatin (LIPITOR) 40 MG tablet TAKE ONE (1) TABLET EACH DAY (Patient taking differently: Take 40 mg by mouth daily. ) 90 tablet 3  . blood glucose meter kit and supplies KIT E11.51, check 2x daily, dispense based on patient and insurance preference 1 each 0  . cefTRIAXone (ROCEPHIN) IVPB Inject 2 g into the vein daily. Indication:  Osteomyelitis left foot Last Day of Therapy:  12/07/2018 Labs - Once weekly (Monday):  CBC/D and CMP, Labs - Every other week:  ESR and CRP  Please pull PIC at completion of IV antibiotics PIC care per protocol: including placement of Biopatch  Fax weekly labs promptly  to Dr.Hendel Gatliff  [phone] Pharmacy/Nursing Call [phone] for  critical value or any concerns 39 Units 0  . ciprofloxacin-dexamethasone (CIPRODEX) OTIC suspension Place 4 drops into the right ear 2 (two) times daily. For 7 days 7.5 mL 0  . clopidogrel (PLAVIX) 75  MG tablet TAKE ONE (1) TABLET EACH DAY (Patient taking differently: Take 75 mg by mouth daily. ) 90 tablet 3  . fluticasone (FLONASE) 50 MCG/ACT nasal spray Place 2 sprays into both nostrils daily. 16 g 2  . gabapentin (NEURONTIN) 300 MG capsule Take 1 capsule (300 mg total) by mouth 4 (four) times daily. 360 capsule 2  . glipiZIDE (GLUCOTROL XL) 10 MG 24 hr tablet TAKE ONE (1) TABLET EACH DAY WITH BREAKFAST (Patient taking differently: Take 10 mg by  mouth daily with breakfast. ) 90 tablet 3  . glucose blood (ONE TOUCH ULTRA TEST) test strip Check blood sugar twice daily Dx code: E11.9 200 each 0  . HYDROcodone-acetaminophen (NORCO) 5-325 MG tablet Take 1 tablet by mouth every 6 (six) hours as needed for moderate pain. 30 tablet 0  . JARDIANCE 25 MG TABS tablet TAKE 1 TABLET BY MOUTH EVERY DAY 90 tablet 3  . linagliptin (TRADJENTA) 5 MG TABS tablet Take 1 tablet (5 mg total) by mouth daily. 90 tablet 1  . lisinopril-hydrochlorothiazide (ZESTORETIC) 10-12.5 MG tablet Take 1 tablet by mouth daily. 90 tablet 3  . metFORMIN (GLUCOPHAGE) 1000 MG tablet TAKE 1 TABLET BY MOUTH TWICE DAILY WITH A MEAL 180 tablet 3  . metoprolol succinate (TOPROL-XL) 50 MG 24 hr tablet Take 1 tablet (50 mg total) by mouth daily. Take with or immediately following a meal. 90 tablet 3  . traMADol (ULTRAM) 50 MG tablet TAKE 1 TABLET BY MOUTH THREE TIMES A DAY 90 tablet 0  . vancomycin IVPB Inject 1,000 mg into the vein every 12 (twelve) hours. Indication:  Osteomyelitis left foot Last Day of Therapy:  12/07/2018 Labs - Monday:  CBC/D, CMP, and vancomycin trough. Labs - Thursday:  BMP and vancomycin trough Labs - Every other week:  ESR and CRP  Please pull PIC at completion of IV antibiotics [facility] Care Per Protocol:incliding placement of Biopatch  Fax weekly labs promptly  to [place]  [phone] Pharmacy/Nursing Call [phone] for  critical value or any concerns 78 Units 0  . Vitamin D, Ergocalciferol,  (DRISDOL) 50000 units CAPS capsule Take 1 capsule (50,000 Units total) by mouth every 7 (seven) days. 8 capsule 0   No current facility-administered medications for this visit.      Abtx:  Anti-infectives (From admission, onward)   None      REVIEW OF SYSTEMS:  Const: negative fever, negative chills, negative weight loss Eyes: negative diplopia or visual changes, negative eye pain ENT: negative coryza, negative sore throat Resp: negative cough, hemoptysis, dyspnea Cards: negative for chest pain, palpitations, lower extremity edema GU: negative for frequency, dysuria and hematuria GI: Negative for abdominal pain, diarrhea, bleeding, constipation Skin: negative for rash and pruritus Heme: negative for easy bruising and gum/nose bleeding MS: negative for myalgias, arthralgias, back pain and muscle weakness Neurolo:negative for headaches, dizziness, vertigo, memory problems  Psych: negative for feelings of anxiety, depression  Endocrine:he does not check his  blood sugar Allergy/Immunology- as above Objective:  Pt looks well thru telemed Left foot surgical site is well coapted- some swelling of the foot but no erythema seen' crusted blood, dryness   Rt picc site- clean  Pertinent Labs Lab Results CBC    Component Value Date/Time   WBC 6.1 11/19/2018 0914   RBC 4.07 (L) 11/19/2018 0914   HGB 12.1 (L) 11/19/2018 0914   HCT 39.0 11/19/2018 0914   PLT 264 11/19/2018 0914   MCV 95.8 11/19/2018 0914   MCH 29.7 11/19/2018 0914   MCHC 31.0 11/19/2018 0914   RDW 16.2 (H) 11/19/2018 0914   LYMPHSABS 1.3 11/19/2018 0914   MONOABS 0.6 11/19/2018 0914   EOSABS 0.1 11/19/2018 0914   BASOSABS 0.0 11/19/2018 0914    CMP Latest Ref Rng & Units 11/19/2018 11/15/2018 11/08/2018  Glucose 70 - 99 mg/dL 149(H) 211(H) 155(H)  BUN 6 - 20 mg/dL '13 18 17  '$ Creatinine 0.61 - 1.24 mg/dL 0.72 0.89 0.86  Sodium 135 - 145 mmol/L 135 136 135  Potassium 3.5 - 5.1 mmol/L 3.9 4.1 4.1  Chloride 98 -  111 mmol/L 98 99 98  CO2 22 - 32 mmol/L '25 26 27  '$ Calcium 8.9 - 10.3 mg/dL 9.0 8.9 8.8(L)  Total Protein 6.5 - 8.1 g/dL 6.4(L) - -  Total Bilirubin 0.3 - 1.2 mg/dL 0.7 - -  Alkaline Phos 38 - 126 U/L 79 - -  AST 15 - 41 U/L 18 - -  ALT 0 - 44 U/L 22 - -    ? Impression/Recommendation ? ?Diabetes with left foot infection with osteomyelitis of the 5th/4th metatarsal- s/p excision- on vanco and ceftriaxone until 12/07/18 Labs look good ESR and CRP will be done this Thursday by his visiting nurse ?pICC will be removed once he completes antibiotics DM on 4 meds  HTN on amlodipine and lisinopril+ hctz  ________________________________________________ Discussed with patient, and his wife Note:  This document was prepared using Dragon voice recognition software and may include unintentional dictation errors.

## 2018-11-21 DIAGNOSIS — A4902 Methicillin resistant Staphylococcus aureus infection, unspecified site: Secondary | ICD-10-CM | POA: Diagnosis not present

## 2018-11-21 DIAGNOSIS — E11621 Type 2 diabetes mellitus with foot ulcer: Secondary | ICD-10-CM | POA: Diagnosis not present

## 2018-11-24 DIAGNOSIS — A4902 Methicillin resistant Staphylococcus aureus infection, unspecified site: Secondary | ICD-10-CM | POA: Diagnosis not present

## 2018-11-26 ENCOUNTER — Encounter (HOSPITAL_COMMUNITY)
Admission: RE | Admit: 2018-11-26 | Discharge: 2018-11-26 | Disposition: A | Discharge status: Home / Self Care | Payer: Medicare Other | Source: Ambulatory Visit | Attending: Infectious Diseases | Admitting: Infectious Diseases

## 2018-11-26 DIAGNOSIS — M868X7 Other osteomyelitis, ankle and foot: Secondary | ICD-10-CM | POA: Insufficient documentation

## 2018-11-26 DIAGNOSIS — B9562 Methicillin resistant Staphylococcus aureus infection as the cause of diseases classified elsewhere: Secondary | ICD-10-CM | POA: Diagnosis not present

## 2018-11-26 DIAGNOSIS — A4902 Methicillin resistant Staphylococcus aureus infection, unspecified site: Secondary | ICD-10-CM | POA: Diagnosis not present

## 2018-11-26 LAB — COMPREHENSIVE METABOLIC PANEL
ALT: 19 U/L (ref 0–44)
AST: 15 U/L (ref 15–41)
Albumin: 3.3 g/dL — ABNORMAL LOW (ref 3.5–5.0)
Alkaline Phosphatase: 86 U/L (ref 38–126)
Anion gap: 9 (ref 5–15)
BUN: 16 mg/dL (ref 6–20)
CO2: 26 mmol/L (ref 22–32)
Calcium: 9.4 mg/dL (ref 8.9–10.3)
Chloride: 99 mmol/L (ref 98–111)
Creatinine, Ser: 0.71 mg/dL (ref 0.61–1.24)
GFR calc Af Amer: >60 mL/min (ref >60)
GFR calc non Af Amer: >60 mL/min (ref >60)
Glucose, Bld: 110 mg/dL — ABNORMAL HIGH (ref 70–99)
Potassium: 4.2 mmol/L (ref 3.5–5.1)
Sodium: 134 mmol/L — ABNORMAL LOW (ref 135–145)
Total Bilirubin: 0.3 mg/dL (ref 0.3–1.2)
Total Protein: 6.6 g/dL (ref 6.5–8.1)

## 2018-11-26 LAB — CBC WITH DIFFERENTIAL/PLATELET
Abs Immature Granulocytes: 0.04 10*3/uL (ref 0.00–0.07)
Basophils Absolute: 0 10*3/uL (ref 0.0–0.1)
Basophils Relative: 1 %
Eosinophils Absolute: 0.2 10*3/uL (ref 0.0–0.5)
Eosinophils Relative: 3 %
HCT: 40.4 % (ref 39.0–52.0)
Hemoglobin: 13 g/dL (ref 13.0–17.0)
Immature Granulocytes: 1 %
Lymphocytes Relative: 27 %
Lymphs Abs: 1.6 10*3/uL (ref 0.7–4.0)
MCH: 29.9 pg (ref 26.0–34.0)
MCHC: 32.2 g/dL (ref 30.0–36.0)
MCV: 92.9 fL (ref 80.0–100.0)
Monocytes Absolute: 0.6 10*3/uL (ref 0.1–1.0)
Monocytes Relative: 11 %
Neutro Abs: 3.5 10*3/uL (ref 1.7–7.7)
Neutrophils Relative %: 57 %
Platelets: 285 10*3/uL (ref 150–400)
RBC: 4.35 MIL/uL (ref 4.22–5.81)
RDW: 15.6 % — ABNORMAL HIGH (ref 11.5–15.5)
WBC: 6 10*3/uL (ref 4.0–10.5)
nRBC: 0 % (ref 0.0–0.2)

## 2018-11-26 LAB — VANCOMYCIN, TROUGH: Vancomycin Tr: 14 ug/mL — ABNORMAL LOW (ref 15–20)

## 2018-11-29 ENCOUNTER — Other Ambulatory Visit: Payer: Self-pay | Admitting: Infectious Diseases

## 2018-11-29 DIAGNOSIS — A4902 Methicillin resistant Staphylococcus aureus infection, unspecified site: Secondary | ICD-10-CM | POA: Diagnosis not present

## 2018-11-30 ENCOUNTER — Other Ambulatory Visit: Payer: Self-pay

## 2018-12-01 DIAGNOSIS — A4902 Methicillin resistant Staphylococcus aureus infection, unspecified site: Secondary | ICD-10-CM | POA: Diagnosis not present

## 2018-12-03 DIAGNOSIS — A4902 Methicillin resistant Staphylococcus aureus infection, unspecified site: Secondary | ICD-10-CM | POA: Diagnosis not present

## 2018-12-04 ENCOUNTER — Other Ambulatory Visit: Payer: Self-pay | Admitting: Infectious Diseases

## 2018-12-05 ENCOUNTER — Other Ambulatory Visit (HOSPITAL_COMMUNITY)
Admission: RE | Admit: 2018-12-05 | Discharge: 2018-12-05 | Disposition: A | Discharge status: Home / Self Care | Payer: Medicare Other | Source: Ambulatory Visit | Attending: Infectious Diseases | Admitting: Infectious Diseases

## 2018-12-05 DIAGNOSIS — M869 Osteomyelitis, unspecified: Secondary | ICD-10-CM | POA: Insufficient documentation

## 2018-12-05 DIAGNOSIS — E11621 Type 2 diabetes mellitus with foot ulcer: Secondary | ICD-10-CM | POA: Insufficient documentation

## 2018-12-05 DIAGNOSIS — B9562 Methicillin resistant Staphylococcus aureus infection as the cause of diseases classified elsewhere: Secondary | ICD-10-CM | POA: Insufficient documentation

## 2018-12-05 DIAGNOSIS — A4902 Methicillin resistant Staphylococcus aureus infection, unspecified site: Secondary | ICD-10-CM | POA: Diagnosis not present

## 2018-12-05 LAB — CBC WITH DIFFERENTIAL/PLATELET
Abs Immature Granulocytes: 0.04 10*3/uL (ref 0.00–0.07)
Basophils Absolute: 0 10*3/uL (ref 0.0–0.1)
Basophils Relative: 1 %
Eosinophils Absolute: 0.1 10*3/uL (ref 0.0–0.5)
Eosinophils Relative: 2 %
HCT: 40.5 % (ref 39.0–52.0)
Hemoglobin: 12.9 g/dL — ABNORMAL LOW (ref 13.0–17.0)
Immature Granulocytes: 1 %
Lymphocytes Relative: 19 %
Lymphs Abs: 1.3 10*3/uL (ref 0.7–4.0)
MCH: 30 pg (ref 26.0–34.0)
MCHC: 31.9 g/dL (ref 30.0–36.0)
MCV: 94.2 fL (ref 80.0–100.0)
Monocytes Absolute: 0.7 10*3/uL (ref 0.1–1.0)
Monocytes Relative: 10 %
Neutro Abs: 4.7 10*3/uL (ref 1.7–7.7)
Neutrophils Relative %: 67 %
Platelets: 315 10*3/uL (ref 150–400)
RBC: 4.3 MIL/uL (ref 4.22–5.81)
RDW: 15 % (ref 11.5–15.5)
WBC: 6.9 10*3/uL (ref 4.0–10.5)
nRBC: 0 % (ref 0.0–0.2)

## 2018-12-05 LAB — COMPREHENSIVE METABOLIC PANEL
ALT: 18 U/L (ref 0–44)
AST: 14 U/L — ABNORMAL LOW (ref 15–41)
Albumin: 3.3 g/dL — ABNORMAL LOW (ref 3.5–5.0)
Alkaline Phosphatase: 86 U/L (ref 38–126)
Anion gap: 11 (ref 5–15)
BUN: 14 mg/dL (ref 6–20)
CO2: 26 mmol/L (ref 22–32)
Calcium: 8.9 mg/dL (ref 8.9–10.3)
Chloride: 97 mmol/L — ABNORMAL LOW (ref 98–111)
Creatinine, Ser: 0.8 mg/dL (ref 0.61–1.24)
GFR calc Af Amer: >60 mL/min (ref >60)
GFR calc non Af Amer: >60 mL/min (ref >60)
Glucose, Bld: 225 mg/dL — ABNORMAL HIGH (ref 70–99)
Potassium: 3.9 mmol/L (ref 3.5–5.1)
Sodium: 134 mmol/L — ABNORMAL LOW (ref 135–145)
Total Bilirubin: 0.5 mg/dL (ref 0.3–1.2)
Total Protein: 6.9 g/dL (ref 6.5–8.1)

## 2018-12-05 LAB — VANCOMYCIN, TROUGH: Vancomycin Tr: 15 ug/mL (ref 15–20)

## 2018-12-05 LAB — SEDIMENTATION RATE: Sed Rate: 50 mm/hr — ABNORMAL HIGH (ref 0–16)

## 2018-12-06 ENCOUNTER — Other Ambulatory Visit (HOSPITAL_COMMUNITY)
Admission: RE | Admit: 2018-12-06 | Discharge: 2018-12-06 | Disposition: A | Discharge status: Home / Self Care | Payer: Medicare Other | Source: Skilled Nursing Facility | Attending: Infectious Diseases | Admitting: Infectious Diseases

## 2018-12-06 DIAGNOSIS — A4902 Methicillin resistant Staphylococcus aureus infection, unspecified site: Secondary | ICD-10-CM | POA: Diagnosis not present

## 2018-12-06 DIAGNOSIS — B9562 Methicillin resistant Staphylococcus aureus infection as the cause of diseases classified elsewhere: Secondary | ICD-10-CM | POA: Insufficient documentation

## 2018-12-06 DIAGNOSIS — Z89422 Acquired absence of other left toe(s): Secondary | ICD-10-CM | POA: Diagnosis not present

## 2018-12-06 DIAGNOSIS — L97522 Non-pressure chronic ulcer of other part of left foot with fat layer exposed: Secondary | ICD-10-CM | POA: Diagnosis not present

## 2018-12-06 LAB — CBC WITH DIFFERENTIAL/PLATELET
Abs Immature Granulocytes: 0.04 10*3/uL (ref 0.00–0.07)
Basophils Absolute: 0.1 10*3/uL (ref 0.0–0.1)
Basophils Relative: 1 %
Eosinophils Absolute: 0.1 10*3/uL (ref 0.0–0.5)
Eosinophils Relative: 2 %
HCT: 41.7 % (ref 39.0–52.0)
Hemoglobin: 13.3 g/dL (ref 13.0–17.0)
Immature Granulocytes: 1 %
Lymphocytes Relative: 20 %
Lymphs Abs: 1.5 10*3/uL (ref 0.7–4.0)
MCH: 30.2 pg (ref 26.0–34.0)
MCHC: 31.9 g/dL (ref 30.0–36.0)
MCV: 94.6 fL (ref 80.0–100.0)
Monocytes Absolute: 0.8 10*3/uL (ref 0.1–1.0)
Monocytes Relative: 11 %
Neutro Abs: 4.8 10*3/uL (ref 1.7–7.7)
Neutrophils Relative %: 65 %
Platelets: 342 10*3/uL (ref 150–400)
RBC: 4.41 MIL/uL (ref 4.22–5.81)
RDW: 14.9 % (ref 11.5–15.5)
WBC: 7.2 10*3/uL (ref 4.0–10.5)
nRBC: 0 % (ref 0.0–0.2)

## 2018-12-06 LAB — COMPREHENSIVE METABOLIC PANEL
ALT: 18 U/L (ref 0–44)
AST: 15 U/L (ref 15–41)
Albumin: 3.3 g/dL — ABNORMAL LOW (ref 3.5–5.0)
Alkaline Phosphatase: 98 U/L (ref 38–126)
Anion gap: 11 (ref 5–15)
BUN: 15 mg/dL (ref 6–20)
CO2: 27 mmol/L (ref 22–32)
Calcium: 9.4 mg/dL (ref 8.9–10.3)
Chloride: 98 mmol/L (ref 98–111)
Creatinine, Ser: 0.79 mg/dL (ref 0.61–1.24)
GFR calc Af Amer: >60 mL/min (ref >60)
GFR calc non Af Amer: >60 mL/min (ref >60)
Glucose, Bld: 147 mg/dL — ABNORMAL HIGH (ref 70–99)
Potassium: 4.4 mmol/L (ref 3.5–5.1)
Sodium: 136 mmol/L (ref 135–145)
Total Bilirubin: 0.6 mg/dL (ref 0.3–1.2)
Total Protein: 6.7 g/dL (ref 6.5–8.1)

## 2018-12-06 LAB — C-REACTIVE PROTEIN: CRP: 3.1 mg/dL — ABNORMAL HIGH (ref <1.0)

## 2018-12-06 LAB — SEDIMENTATION RATE: Sed Rate: 40 mm/hr — ABNORMAL HIGH (ref 0–16)

## 2018-12-06 LAB — VANCOMYCIN, TROUGH: Vancomycin Tr: 15 ug/mL (ref 15–20)

## 2018-12-07 ENCOUNTER — Telehealth: Payer: Self-pay | Admitting: Licensed Clinical Social Worker

## 2018-12-07 ENCOUNTER — Encounter (INDEPENDENT_AMBULATORY_CARE_PROVIDER_SITE_OTHER): Payer: Medicare Other

## 2018-12-07 ENCOUNTER — Ambulatory Visit (INDEPENDENT_AMBULATORY_CARE_PROVIDER_SITE_OTHER): Payer: Medicare Other | Admitting: Vascular Surgery

## 2018-12-07 NOTE — Telephone Encounter (Signed)
Thanks

## 2018-12-07 NOTE — Telephone Encounter (Signed)
Followed up with the patient and he states that the nurse is coming out tomorrow to remove the PICC line. Patient saw Dr. Vickki Muff yesterday and patient reports that his foot is doing very well, and that he feels well.

## 2018-12-08 DIAGNOSIS — A4902 Methicillin resistant Staphylococcus aureus infection, unspecified site: Secondary | ICD-10-CM | POA: Diagnosis not present

## 2018-12-16 ENCOUNTER — Other Ambulatory Visit: Payer: Self-pay | Admitting: Family Medicine

## 2018-12-16 DIAGNOSIS — G8929 Other chronic pain: Secondary | ICD-10-CM

## 2018-12-16 DIAGNOSIS — M5442 Lumbago with sciatica, left side: Secondary | ICD-10-CM

## 2018-12-24 ENCOUNTER — Other Ambulatory Visit: Payer: Self-pay | Admitting: Family Medicine

## 2018-12-24 DIAGNOSIS — G629 Polyneuropathy, unspecified: Secondary | ICD-10-CM

## 2018-12-25 DIAGNOSIS — E1140 Type 2 diabetes mellitus with diabetic neuropathy, unspecified: Secondary | ICD-10-CM | POA: Diagnosis not present

## 2018-12-25 DIAGNOSIS — B351 Tinea unguium: Secondary | ICD-10-CM | POA: Diagnosis not present

## 2018-12-25 DIAGNOSIS — L97522 Non-pressure chronic ulcer of other part of left foot with fat layer exposed: Secondary | ICD-10-CM | POA: Diagnosis not present

## 2019-01-08 DIAGNOSIS — Z89422 Acquired absence of other left toe(s): Secondary | ICD-10-CM | POA: Diagnosis not present

## 2019-01-08 DIAGNOSIS — M14672 Charcot's joint, left ankle and foot: Secondary | ICD-10-CM | POA: Diagnosis not present

## 2019-01-08 DIAGNOSIS — M86272 Subacute osteomyelitis, left ankle and foot: Secondary | ICD-10-CM | POA: Diagnosis not present

## 2019-01-16 ENCOUNTER — Other Ambulatory Visit: Payer: Self-pay | Admitting: Family Medicine

## 2019-01-16 DIAGNOSIS — G8929 Other chronic pain: Secondary | ICD-10-CM

## 2019-01-16 DIAGNOSIS — M5442 Lumbago with sciatica, left side: Secondary | ICD-10-CM

## 2019-01-18 ENCOUNTER — Telehealth: Payer: Self-pay | Admitting: Family Medicine

## 2019-01-18 DIAGNOSIS — G8929 Other chronic pain: Secondary | ICD-10-CM

## 2019-01-18 NOTE — Telephone Encounter (Signed)
Medication: traMADol (ULTRAM) 50 MG tablet [[id number]]   Has the patient contacted their pharmacy? Yes  (Agent: If no, request that the patient contact the pharmacy for the refill.) (Agent: If yes, when and what did the pharmacy advise?)  Preferred Pharmacy (with phone number or street name): [id number] - [place], [place] [address] [phone] (Phone) [phone] (Fax)    Agent: Please be advised that RX refills may take up to 3 business days. We ask that you follow-up with your pharmacy.

## 2019-01-21 MED ORDER — TRAMADOL HCL 50 MG PO TABS: 50.0000 mg | ORAL_TABLET | Freq: Three times a day (TID) | ORAL | 0 refills | Status: DC

## 2019-01-21 NOTE — Telephone Encounter (Signed)
Last refill 12/17/18 for 90 no refills last OV with PCP 5/20

## 2019-01-21 NOTE — Telephone Encounter (Signed)
Sent to pharmacy 

## 2019-01-21 NOTE — Telephone Encounter (Signed)
Refilled: 12/17/2018 Last OV: 11/06/2018 Next OV: 02/01/2019

## 2019-01-22 ENCOUNTER — Encounter (INDEPENDENT_AMBULATORY_CARE_PROVIDER_SITE_OTHER): Payer: Medicare Other

## 2019-01-22 ENCOUNTER — Ambulatory Visit (INDEPENDENT_AMBULATORY_CARE_PROVIDER_SITE_OTHER): Payer: Medicare Other | Admitting: Nurse Practitioner

## 2019-01-22 DIAGNOSIS — S93315D Dislocation of tarsal joint of left foot, subsequent encounter: Secondary | ICD-10-CM | POA: Diagnosis not present

## 2019-01-22 DIAGNOSIS — M14672 Charcot's joint, left ankle and foot: Secondary | ICD-10-CM | POA: Diagnosis not present

## 2019-01-29 ENCOUNTER — Other Ambulatory Visit: Payer: Self-pay

## 2019-01-29 ENCOUNTER — Inpatient Hospital Stay: Admission: RE | Admit: 2019-01-29 | Payer: Medicare Other | Source: Ambulatory Visit

## 2019-01-29 ENCOUNTER — Other Ambulatory Visit: Payer: Self-pay | Admitting: Podiatry

## 2019-01-29 ENCOUNTER — Encounter
Admission: RE | Admit: 2019-01-29 | Discharge: 2019-01-29 | Disposition: A | Discharge status: Home / Self Care | Payer: Medicare Other | Source: Ambulatory Visit | Attending: Podiatry | Admitting: Podiatry

## 2019-01-29 DIAGNOSIS — Z20828 Contact with and (suspected) exposure to other viral communicable diseases: Secondary | ICD-10-CM | POA: Diagnosis not present

## 2019-01-29 DIAGNOSIS — E11621 Type 2 diabetes mellitus with foot ulcer: Secondary | ICD-10-CM | POA: Diagnosis not present

## 2019-01-29 DIAGNOSIS — M14672 Charcot's joint, left ankle and foot: Secondary | ICD-10-CM | POA: Diagnosis not present

## 2019-01-29 DIAGNOSIS — S93315D Dislocation of tarsal joint of left foot, subsequent encounter: Secondary | ICD-10-CM | POA: Diagnosis not present

## 2019-01-29 DIAGNOSIS — Z01812 Encounter for preprocedural laboratory examination: Secondary | ICD-10-CM | POA: Diagnosis not present

## 2019-01-29 DIAGNOSIS — L97422 Non-pressure chronic ulcer of left heel and midfoot with fat layer exposed: Secondary | ICD-10-CM | POA: Diagnosis not present

## 2019-01-29 NOTE — Patient Instructions (Signed)
Your procedure is scheduled on: Friday 12/11 Report to Day Surgery. To find out your arrival time please call [phone] between 1PM - 3PM on Thurs 12/10.  Remember: Instructions that are not followed completely may result in serious medical risk,  up to and including death, or upon the discretion of your surgeon and anesthesiologist your  surgery may need to be rescheduled.     _X__ 1. Do not eat food after midnight the night before your procedure.                 No gum chewing or hard candies. You may drink clear liquids up to 2 hours                 before you are scheduled to arrive for your surgery- DO not drink clear                 liquids within 2 hours of the start of your surgery.                 Clear Liquids include:  water, apple juice without pulp, clear carbohydrate                 drink such as Clearfast of Gatorade, Black Coffee or Tea (Do not add                 anything to coffee or tea).  __X__2.  On the morning of surgery brush your teeth with toothpaste and water, you                may rinse your mouth with mouthwash if you wish.  Do not swallow any toothpaste of mouthwash.     ___ 3.  No Alcohol for 24 hours before or after surgery.   ___ 4.  Do Not Smoke or use e-cigarettes For 24 Hours Prior to Your Surgery.                 Do not use any chewable tobacco products for at least 6 hours prior to                 surgery.  ____  5.  Bring all medications with you on the day of surgery if instructed.   __x__  6.  Notify your doctor if there is any change in your medical condition      (cold, fever, infections).     Do not wear jewelry, make-up, hairpins, clips or nail polish. Do not wear lotions, powders, or perfumes. You may wear deodorant. Do not shave 48 hours prior to surgery. Men may shave face and neck. Do not bring valuables to the hospital.    [facility] is not responsible for any belongings or valuables.  Contacts,  dentures or bridgework may not be worn into surgery. Leave your suitcase in the car. After surgery it may be brought to your room. For patients admitted to the hospital, discharge time is determined by your treatment team.   Patients discharged the day of surgery will not be allowed to drive home.   Please read over the following fact sheets that you were given:    __x__ Take these medicines the morning of surgery with A SIP OF WATER:    1. amLODipine (NORVASC) 10 MG tablet  2. gabapentin (NEURONTIN) 300 MG capsule  3. metoprolol succinate (TOPROL-XL) 50 MG 24 hr tablet  4.  5.  6.  ____ Fleet Enema (as directed)  _x___ Use Sage wipes as directed  ____ Use inhalers on the day of surgery  _x___ Stop metformin 2 days prior to surgery Tonight your last dose   ____ Take 1/2 of usual insulin dose the night before surgery. No insulin the morning          of surgery.   __x__ Stop Plavix today  ____ Stop Anti-inflammatories on    ____ Stop supplements until after surgery.    ____ Bring C-Pap to the hospital.

## 2019-01-30 LAB — SARS CORONAVIRUS 2 (TAT 6-24 HRS): SARS Coronavirus 2: NEGATIVE

## 2019-02-01 ENCOUNTER — Ambulatory Visit: Payer: Medicare Other

## 2019-02-01 ENCOUNTER — Other Ambulatory Visit: Payer: Self-pay

## 2019-02-01 ENCOUNTER — Ambulatory Visit: Payer: Medicare Other | Admitting: Anesthesiology

## 2019-02-01 ENCOUNTER — Ambulatory Visit: Payer: Medicare Other | Admitting: Family Medicine

## 2019-02-01 ENCOUNTER — Observation Stay
Admission: AD | Admit: 2019-02-01 | Discharge: 2019-02-02 | Disposition: A | Discharge status: Home / Self Care | Payer: Medicare Other | Attending: Podiatry | Admitting: Podiatry

## 2019-02-01 ENCOUNTER — Encounter
Admission: AD | Disposition: A | Discharge status: Home / Self Care | Payer: Self-pay | Source: Home / Self Care | Attending: Podiatry

## 2019-02-01 ENCOUNTER — Encounter: Payer: Self-pay | Admitting: Podiatry

## 2019-02-01 DIAGNOSIS — E119 Type 2 diabetes mellitus without complications: Secondary | ICD-10-CM | POA: Diagnosis not present

## 2019-02-01 DIAGNOSIS — E1140 Type 2 diabetes mellitus with diabetic neuropathy, unspecified: Secondary | ICD-10-CM | POA: Insufficient documentation

## 2019-02-01 DIAGNOSIS — Z7902 Long term (current) use of antithrombotics/antiplatelets: Secondary | ICD-10-CM | POA: Diagnosis not present

## 2019-02-01 DIAGNOSIS — Z981 Arthrodesis status: Secondary | ICD-10-CM | POA: Diagnosis not present

## 2019-02-01 DIAGNOSIS — Z7984 Long term (current) use of oral hypoglycemic drugs: Secondary | ICD-10-CM | POA: Insufficient documentation

## 2019-02-01 DIAGNOSIS — M216X2 Other acquired deformities of left foot: Secondary | ICD-10-CM | POA: Diagnosis not present

## 2019-02-01 DIAGNOSIS — E1151 Type 2 diabetes mellitus with diabetic peripheral angiopathy without gangrene: Secondary | ICD-10-CM | POA: Diagnosis not present

## 2019-02-01 DIAGNOSIS — I739 Peripheral vascular disease, unspecified: Secondary | ICD-10-CM

## 2019-02-01 DIAGNOSIS — Z87891 Personal history of nicotine dependence: Secondary | ICD-10-CM | POA: Insufficient documentation

## 2019-02-01 DIAGNOSIS — M19072 Primary osteoarthritis, left ankle and foot: Secondary | ICD-10-CM | POA: Insufficient documentation

## 2019-02-01 DIAGNOSIS — Z7982 Long term (current) use of aspirin: Secondary | ICD-10-CM | POA: Diagnosis not present

## 2019-02-01 DIAGNOSIS — Z79899 Other long term (current) drug therapy: Secondary | ICD-10-CM | POA: Insufficient documentation

## 2019-02-01 DIAGNOSIS — M14672 Charcot's joint, left ankle and foot: Secondary | ICD-10-CM | POA: Diagnosis not present

## 2019-02-01 DIAGNOSIS — I10 Essential (primary) hypertension: Secondary | ICD-10-CM | POA: Insufficient documentation

## 2019-02-01 DIAGNOSIS — Z419 Encounter for procedure for purposes other than remedying health state, unspecified: Secondary | ICD-10-CM

## 2019-02-01 DIAGNOSIS — E1165 Type 2 diabetes mellitus with hyperglycemia: Secondary | ICD-10-CM

## 2019-02-01 DIAGNOSIS — E11610 Type 2 diabetes mellitus with diabetic neuropathic arthropathy: Principal | ICD-10-CM | POA: Insufficient documentation

## 2019-02-01 DIAGNOSIS — S93315A Dislocation of tarsal joint of left foot, initial encounter: Secondary | ICD-10-CM | POA: Diagnosis not present

## 2019-02-01 DIAGNOSIS — M1990 Unspecified osteoarthritis, unspecified site: Secondary | ICD-10-CM | POA: Diagnosis not present

## 2019-02-01 HISTORY — PX: FOOT ARTHRODESIS: SHX1655

## 2019-02-01 HISTORY — PX: ARTHRODESIS METATARSAL: SHX6565

## 2019-02-01 LAB — CBC
HCT: 37.2 % — ABNORMAL LOW (ref 39.0–52.0)
Hemoglobin: 12.1 g/dL — ABNORMAL LOW (ref 13.0–17.0)
MCH: 28.1 pg (ref 26.0–34.0)
MCHC: 32.5 g/dL (ref 30.0–36.0)
MCV: 86.5 fL (ref 80.0–100.0)
Platelets: 261 10*3/uL (ref 150–400)
RBC: 4.3 MIL/uL (ref 4.22–5.81)
RDW: 13.9 % (ref 11.5–15.5)
WBC: 10.2 10*3/uL (ref 4.0–10.5)
nRBC: 0 % (ref 0.0–0.2)

## 2019-02-01 LAB — HEMOGLOBIN A1C
Hgb A1c MFr Bld: 7 % — ABNORMAL HIGH (ref 4.8–5.6)
Mean Plasma Glucose: 154.2 mg/dL

## 2019-02-01 LAB — CREATININE, SERUM
Creatinine, Ser: 0.88 mg/dL (ref 0.61–1.24)
GFR calc Af Amer: >60 mL/min (ref >60)
GFR calc non Af Amer: >60 mL/min (ref >60)

## 2019-02-01 LAB — GLUCOSE, CAPILLARY
Glucose-Capillary: 114 mg/dL — ABNORMAL HIGH (ref 70–99)
Glucose-Capillary: 164 mg/dL — ABNORMAL HIGH (ref 70–99)
Glucose-Capillary: 187 mg/dL — ABNORMAL HIGH (ref 70–99)

## 2019-02-01 SURGERY — FUSION, JOINT, FOOT
Anesthesia: General | Site: Toe | Laterality: Left

## 2019-02-01 MED ORDER — CLOPIDOGREL BISULFATE 75 MG PO TABS
75.0000 mg | ORAL_TABLET | Freq: Every day | ORAL | Status: DC
  Administered 2019-02-02: 75 mg via ORAL
  Filled 2019-02-01: qty 1

## 2019-02-01 MED ORDER — ATORVASTATIN CALCIUM 20 MG PO TABS
40.0000 mg | ORAL_TABLET | Freq: Every day | ORAL | Status: DC
  Administered 2019-02-02: 40 mg via ORAL
  Filled 2019-02-01 (×2): qty 2

## 2019-02-01 MED ORDER — FAMOTIDINE 20 MG PO TABS: 20.0000 mg | ORAL_TABLET | Freq: Once | ORAL | Status: AC

## 2019-02-01 MED ORDER — ACETAMINOPHEN 10 MG/ML IV SOLN
INTRAVENOUS | Status: DC | PRN
  Administered 2019-02-01: 1000 mg via INTRAVENOUS

## 2019-02-01 MED ORDER — MORPHINE SULFATE (PF) 4 MG/ML IV SOLN
INTRAVENOUS | Status: AC
  Administered 2019-02-01: 1 mg via INTRAVENOUS
  Filled 2019-02-01: qty 1

## 2019-02-01 MED ORDER — ENOXAPARIN SODIUM 40 MG/0.4ML ~~LOC~~ SOLN
40.0000 mg | SUBCUTANEOUS | Status: DC
  Administered 2019-02-01: 40 mg via SUBCUTANEOUS
  Filled 2019-02-01: qty 0.4

## 2019-02-01 MED ORDER — CEFAZOLIN SODIUM-DEXTROSE 2-4 GM/100ML-% IV SOLN
2.0000 g | INTRAVENOUS | Status: AC
  Administered 2019-02-01 (×2): 2 g via INTRAVENOUS

## 2019-02-01 MED ORDER — LISINOPRIL 10 MG PO TABS
10.0000 mg | ORAL_TABLET | Freq: Every day | ORAL | Status: DC
  Administered 2019-02-02: 10 mg via ORAL
  Filled 2019-02-01: qty 1

## 2019-02-01 MED ORDER — GABAPENTIN 300 MG PO CAPS
300.0000 mg | ORAL_CAPSULE | Freq: Four times a day (QID) | ORAL | Status: DC
  Administered 2019-02-01 – 2019-02-02 (×4): 300 mg via ORAL
  Filled 2019-02-01: qty 3
  Filled 2019-02-01 (×3): qty 1

## 2019-02-01 MED ORDER — BUPIVACAINE-EPINEPHRINE (PF) 0.25% -1:200000 IJ SOLN
INTRAMUSCULAR | Status: DC | PRN
  Administered 2019-02-01: 30 mL

## 2019-02-01 MED ORDER — FAMOTIDINE 20 MG PO TABS
ORAL_TABLET | ORAL | Status: AC
  Administered 2019-02-01: 20 mg via ORAL
  Filled 2019-02-01: qty 1

## 2019-02-01 MED ORDER — LISINOPRIL-HYDROCHLOROTHIAZIDE 10-12.5 MG PO TABS: 1.0000 | ORAL_TABLET | Freq: Every day | ORAL | Status: DC

## 2019-02-01 MED ORDER — ONDANSETRON HCL 4 MG/2ML IJ SOLN
INTRAMUSCULAR | Status: DC | PRN
  Administered 2019-02-01: 4 mg via INTRAVENOUS

## 2019-02-01 MED ORDER — OXYCODONE HCL 5 MG PO TABS: 5.0000 mg | ORAL_TABLET | Freq: Once | ORAL | Status: DC | PRN

## 2019-02-01 MED ORDER — HYDROCHLOROTHIAZIDE 12.5 MG PO CAPS
12.5000 mg | ORAL_CAPSULE | Freq: Every day | ORAL | Status: DC
  Administered 2019-02-02: 12.5 mg via ORAL
  Filled 2019-02-01: qty 1

## 2019-02-01 MED ORDER — PHENYLEPHRINE HCL (PRESSORS) 10 MG/ML IV SOLN
INTRAVENOUS | Status: DC | PRN
  Administered 2019-02-01 (×2): 100 ug via INTRAVENOUS
  Administered 2019-02-01: 200 ug via INTRAVENOUS
  Administered 2019-02-01 (×2): 100 ug via INTRAVENOUS

## 2019-02-01 MED ORDER — CEFAZOLIN SODIUM-DEXTROSE 2-4 GM/100ML-% IV SOLN
INTRAVENOUS | Status: AC
  Filled 2019-02-01: qty 100

## 2019-02-01 MED ORDER — INSULIN ASPART 100 UNIT/ML ~~LOC~~ SOLN
0.0000 [IU] | Freq: Three times a day (TID) | SUBCUTANEOUS | Status: DC
  Administered 2019-02-02: 3 [IU] via SUBCUTANEOUS
  Filled 2019-02-01: qty 1

## 2019-02-01 MED ORDER — ASPIRIN EC 81 MG PO TBEC
81.0000 mg | DELAYED_RELEASE_TABLET | Freq: Every day | ORAL | Status: DC
  Administered 2019-02-02: 81 mg via ORAL
  Filled 2019-02-01 (×2): qty 1

## 2019-02-01 MED ORDER — SODIUM CHLORIDE 0.9 % IV SOLN
INTRAVENOUS | Status: DC
  Administered 2019-02-01: 07:00:00 via INTRAVENOUS

## 2019-02-01 MED ORDER — INSULIN ASPART 100 UNIT/ML ~~LOC~~ SOLN: 0.0000 [IU] | Freq: Every day | SUBCUTANEOUS | Status: DC

## 2019-02-01 MED ORDER — SODIUM CHLORIDE 0.9 % IV SOLN
INTRAVENOUS | Status: DC | PRN
  Administered 2019-02-01: 300 mL via INTRAVENOUS
  Administered 2019-02-02: 150 mL via INTRAVENOUS

## 2019-02-01 MED ORDER — OXYCODONE HCL 5 MG/5ML PO SOLN: 5.0000 mg | Freq: Once | ORAL | Status: DC | PRN

## 2019-02-01 MED ORDER — LIDOCAINE HCL (CARDIAC) PF 100 MG/5ML IV SOSY
PREFILLED_SYRINGE | INTRAVENOUS | Status: DC | PRN
  Administered 2019-02-01: 100 mg via INTRAVENOUS

## 2019-02-01 MED ORDER — THROMBIN 5000 UNITS EX SOLR
CUTANEOUS | Status: DC | PRN
  Administered 2019-02-01 (×2): 5000 [IU] via TOPICAL

## 2019-02-01 MED ORDER — SUCCINYLCHOLINE CHLORIDE 20 MG/ML IJ SOLN
INTRAMUSCULAR | Status: DC | PRN
  Administered 2019-02-01: 170 mg via INTRAVENOUS

## 2019-02-01 MED ORDER — LINAGLIPTIN 5 MG PO TABS
5.0000 mg | ORAL_TABLET | Freq: Every day | ORAL | Status: DC
  Filled 2019-02-01: qty 1

## 2019-02-01 MED ORDER — METOPROLOL SUCCINATE ER 50 MG PO TB24
50.0000 mg | ORAL_TABLET | Freq: Every day | ORAL | Status: DC
  Administered 2019-02-02: 50 mg via ORAL
  Filled 2019-02-01: qty 1

## 2019-02-01 MED ORDER — FENTANYL CITRATE (PF) 100 MCG/2ML IJ SOLN: 25.0000 ug | INTRAMUSCULAR | Status: DC | PRN

## 2019-02-01 MED ORDER — HYDROCODONE-ACETAMINOPHEN 5-325 MG PO TABS
1.0000 | ORAL_TABLET | Freq: Four times a day (QID) | ORAL | Status: DC | PRN
  Administered 2019-02-01 – 2019-02-02 (×3): 2 via ORAL
  Filled 2019-02-01 (×4): qty 2

## 2019-02-01 MED ORDER — FENTANYL CITRATE (PF) 100 MCG/2ML IJ SOLN
INTRAMUSCULAR | Status: DC | PRN
  Administered 2019-02-01 (×3): 50 ug via INTRAVENOUS
  Administered 2019-02-01 (×6): 25 ug via INTRAVENOUS

## 2019-02-01 MED ORDER — MORPHINE SULFATE (PF) 4 MG/ML IV SOLN: 1.0000 mg | INTRAVENOUS | Status: DC | PRN

## 2019-02-01 MED ORDER — GLIPIZIDE ER 10 MG PO TB24: 10.0000 mg | ORAL_TABLET | Freq: Every day | ORAL | Status: DC

## 2019-02-01 MED ORDER — CEFAZOLIN SODIUM-DEXTROSE 2-4 GM/100ML-% IV SOLN
2.0000 g | Freq: Three times a day (TID) | INTRAVENOUS | Status: DC
  Administered 2019-02-01 – 2019-02-02 (×3): 2 g via INTRAVENOUS
  Filled 2019-02-01 (×4): qty 100

## 2019-02-01 MED ORDER — SODIUM CHLORIDE 0.9 % IV SOLN
INTRAVENOUS | Status: DC | PRN
  Administered 2019-02-01: 40 ug/min via INTRAVENOUS

## 2019-02-01 MED ORDER — AMLODIPINE BESYLATE 10 MG PO TABS
10.0000 mg | ORAL_TABLET | Freq: Every day | ORAL | Status: DC
  Administered 2019-02-02: 10 mg via ORAL
  Filled 2019-02-01: qty 1

## 2019-02-01 MED ORDER — POVIDONE-IODINE 7.5 % EX SOLN
Freq: Once | CUTANEOUS | Status: DC
  Filled 2019-02-01: qty 118

## 2019-02-01 MED ORDER — EPHEDRINE SULFATE 50 MG/ML IJ SOLN
INTRAMUSCULAR | Status: DC | PRN
  Administered 2019-02-01: 10 mg via INTRAVENOUS
  Administered 2019-02-01: 5 mg via INTRAVENOUS
  Administered 2019-02-01 (×2): 10 mg via INTRAVENOUS

## 2019-02-01 MED ORDER — PROPOFOL 10 MG/ML IV BOLUS
INTRAVENOUS | Status: DC | PRN
  Administered 2019-02-01: 170 mg via INTRAVENOUS

## 2019-02-01 MED ORDER — MIDAZOLAM HCL 2 MG/2ML IJ SOLN
INTRAMUSCULAR | Status: DC | PRN
  Administered 2019-02-01: 2 mg via INTRAVENOUS

## 2019-02-01 MED ORDER — FAMOTIDINE 20 MG PO TABS
ORAL_TABLET | ORAL | Status: AC
  Filled 2019-02-01: qty 1

## 2019-02-01 MED ORDER — METFORMIN HCL 500 MG PO TABS
1000.0000 mg | ORAL_TABLET | Freq: Two times a day (BID) | ORAL | Status: DC
  Filled 2019-02-01: qty 2

## 2019-02-01 SURGICAL SUPPLY — 87 items
BIT DRILL CANNULATED 4.6 (BIT) ×6 IMPLANT
BLADE SURG 15 STRL LF DISP TIS (BLADE) ×4 IMPLANT
BLADE SURG 15 STRL SS (BLADE) ×2
BNDG COHESIVE 4X5 TAN STRL (GAUZE/BANDAGES/DRESSINGS) ×3 IMPLANT
BNDG CONFORM 2 STRL LF (GAUZE/BANDAGES/DRESSINGS) ×3 IMPLANT
BNDG CONFORM 3 STRL LF (GAUZE/BANDAGES/DRESSINGS) ×3 IMPLANT
BNDG ELASTIC 4X5.8 VLCR NS LF (GAUZE/BANDAGES/DRESSINGS) ×6 IMPLANT
BNDG ESMARK 4X12 TAN STRL LF (GAUZE/BANDAGES/DRESSINGS) ×3 IMPLANT
BNDG GAUZE 4.5X4.1 6PLY STRL (MISCELLANEOUS) ×3 IMPLANT
BUR 4X45 EGG (BURR) ×3 IMPLANT
CANISTER SUCT 1200ML W/VALVE (MISCELLANEOUS) ×3 IMPLANT
COUNTERSINK 7.0 (MISCELLANEOUS) ×3
COUNTERSINK CANN HDLS 7.2X200 (ORTHOPEDIC DISPOSABLE SUPPLIES) ×3
COVER LIGHT HANDLE STERIS (MISCELLANEOUS) ×3 IMPLANT
COVER PIN YLW 0.028-062 (MISCELLANEOUS) IMPLANT
COVER WAND RF STERILE (DRAPES) ×3 IMPLANT
CUFF TOURN SGL QUICK 18X4 (TOURNIQUET CUFF) IMPLANT
CUFF TOURN SGL QUICK 24 (TOURNIQUET CUFF)
CUFF TOURN SGL QUICK 30 (TOURNIQUET CUFF) ×1
CUFF TRNQT CYL 24X4X16.5-23 (TOURNIQUET CUFF) IMPLANT
CUFF TRNQT CYL 30X4X21-28X (TOURNIQUET CUFF) ×2 IMPLANT
DRAPE C-ARM XRAY 36X54 (DRAPES) ×3 IMPLANT
DRAPE C-ARMOR (DRAPES) ×3 IMPLANT
DRAPE FLUOR MINI C-ARM 54X84 (DRAPES) ×3 IMPLANT
DURAPREP 26ML APPLICATOR (WOUND CARE) ×3 IMPLANT
ELECT REM PT RETURN 9FT ADLT (ELECTROSURGICAL) ×3
ELECTRODE REM PT RTRN 9FT ADLT (ELECTROSURGICAL) ×2 IMPLANT
GAUZE 4X4 16PLY RFD (DISPOSABLE) ×6 IMPLANT
GAUZE SPONGE 4X4 12PLY STRL (GAUZE/BANDAGES/DRESSINGS) ×3 IMPLANT
GAUZE XEROFORM 1X8 LF (GAUZE/BANDAGES/DRESSINGS) ×3 IMPLANT
GLOVE BIO SURGEON STRL SZ7.5 (GLOVE) ×3 IMPLANT
GLOVE INDICATOR 8.0 STRL GRN (GLOVE) ×3 IMPLANT
GOWN STRL REUS W/ TWL LRG LVL3 (GOWN DISPOSABLE) ×4 IMPLANT
GOWN STRL REUS W/TWL LRG LVL3 (GOWN DISPOSABLE) ×2
GRAFT TRIN ELITE MED MUSC TRAN (Graft) ×6 IMPLANT
HANDLE YANKAUER SUCT BULB TIP (MISCELLANEOUS) ×3 IMPLANT
HEMOVAC 400CC 10FR (MISCELLANEOUS) ×3 IMPLANT
K-WIRE SINGLE SMOOTH 2.3X300 (WIRE) ×6
K-WIRE SMOOTH TROCAR 2.0X150 (WIRE) ×15
K-WIRE THRD TT 2.3X230 (WIRE) ×6
KIT TURNOVER KIT A (KITS) ×3 IMPLANT
KWIRE SINGLE SMOOTH 2.3X300 (WIRE) ×4 IMPLANT
KWIRE SMOOTH TROCAR 2.0X150 (WIRE) ×10 IMPLANT
KWIRE THRD TT 2.3X230 (WIRE) ×4 IMPLANT
NEEDLE FILTER BLUNT 18X 1/2SAF (NEEDLE) ×1
NEEDLE FILTER BLUNT 18X1 1/2 (NEEDLE) ×2 IMPLANT
NEEDLE HYPO 25X1 1.5 SAFETY (NEEDLE) ×6 IMPLANT
NS IRRIG 1000ML POUR BTL (IV SOLUTION) ×3 IMPLANT
PACK EXTREMITY ARMC (MISCELLANEOUS) ×3 IMPLANT
PAD ABD DERMACEA PRESS 5X9 (GAUZE/BANDAGES/DRESSINGS) ×12 IMPLANT
PADDING CAST 2X4YD ST (MISCELLANEOUS) ×2
PADDING CAST BLEND 2X4 STRL (MISCELLANEOUS) ×4 IMPLANT
PADDING CAST BLEND 4X4 NS (MISCELLANEOUS) ×3 IMPLANT
SCREW CANN HEADLESS 7.2X150 (Screw) ×3 IMPLANT
SCREW CNTRSNK CANN HDLS7.2X200 (ORTHOPEDIC DISPOSABLE SUPPLIES) ×2 IMPLANT
SCREW COUNTERSINK 7.0 (MISCELLANEOUS) ×2 IMPLANT
SCREW FULLY THREADED 7.0X84 (Screw) ×3 IMPLANT
SCREW SHORT THRD 7.0X86X17 (Screw) ×6 IMPLANT
SPLINT CAST 1 STEP 4X30 (MISCELLANEOUS) ×3 IMPLANT
SPLINT CAST 1 STEP 5X30 WHT (MISCELLANEOUS) ×3 IMPLANT
SPLINT FAST PLASTER 5X30 (CAST SUPPLIES) ×1
SPLINT PLASTER CAST FAST 5X30 (CAST SUPPLIES) ×2 IMPLANT
SPOGE SURGIFLO 8M (HEMOSTASIS) ×2
SPONGE LAP 18X18 RF (DISPOSABLE) ×6 IMPLANT
SPONGE SURGIFLO 8M (HEMOSTASIS) ×4 IMPLANT
STAPLER SKIN PROX 35W (STAPLE) ×3 IMPLANT
STIMULATOR BONE (ORTHOPEDIC SUPPLIES) ×1
STIMULATOR BONE GROWTH EMG EXT (ORTHOPEDIC SUPPLIES) ×2 IMPLANT
STOCKINETTE M/LG 89821 (MISCELLANEOUS) ×3 IMPLANT
STRIP CLOSURE SKIN 1/4X4 (GAUZE/BANDAGES/DRESSINGS) ×3 IMPLANT
SUCTION FRAZIER HANDLE 10FR (MISCELLANEOUS) ×1
SUCTION TUBE FRAZIER 10FR DISP (MISCELLANEOUS) ×2 IMPLANT
SUT ETHILON 3-0 FS-10 30 BLK (SUTURE) ×9
SUT ETHILON 4-0 (SUTURE) ×1
SUT ETHILON 4-0 FS2 18XMFL BLK (SUTURE) ×2
SUT VIC AB 2-0 CT1 27 (SUTURE) ×3
SUT VIC AB 2-0 CT1 TAPERPNT 27 (SUTURE) ×6 IMPLANT
SUT VIC AB 3-0 SH 27 (SUTURE) ×3
SUT VIC AB 3-0 SH 27X BRD (SUTURE) ×6 IMPLANT
SUT VIC AB 4-0 FS2 27 (SUTURE) ×3 IMPLANT
SUT VICRYL 2 0 18  UND BR (SUTURE) ×1
SUT VICRYL 2 0 18 UND BR (SUTURE) ×2 IMPLANT
SUTURE EHLN 3-0 FS-10 30 BLK (SUTURE) ×6 IMPLANT
SUTURE ETHLN 4-0 FS2 18XMF BLK (SUTURE) ×2 IMPLANT
SYR 10ML LL (SYRINGE) ×3 IMPLANT
TOWEL OR 17X26 4PK STRL BLUE (TOWEL DISPOSABLE) ×3 IMPLANT
WIRE Z .062 C-WIRE SPADE TIP (WIRE) ×9 IMPLANT

## 2019-02-01 NOTE — Anesthesia Preprocedure Evaluation (Addendum)
Anesthesia Evaluation  Patient identified by MRN, date of birth, ID band Patient awake    Reviewed: Allergy & Precautions, H&P , NPO status , Patient's Chart, lab work & pertinent test results  Airway Mallampati: II  TM Distance: >3 FB Neck ROM: full    Dental  (+) Edentulous Lower, Edentulous Upper   Pulmonary neg shortness of breath, former smoker (quit a few months ago),           Cardiovascular hypertension,      Neuro/Psych negative neurological ROS  negative psych ROS   GI/Hepatic negative GI ROS, Neg liver ROS,   Endo/Other  diabetes  Renal/GU      Musculoskeletal   Abdominal   Peds  Hematology negative hematology ROS (+)   Anesthesia Other Findings Obese  Past Medical History: No date: Arthritis No date: Diabetes mellitus without complication [name] No date: History of kidney stones     Comment:  h/o No date: Hyperlipidemia No date: Hypertension No date: Neuropathy  Past Surgical History: 08/10/2018: AMPUTATION TOE; Left     Comment:  Procedure: RAY LEFT;  Surgeon: Gwyneth Revels, DPM;                Location: [facility] OR[facility];  Service: Podiatry;  Laterality:               Left; 10/26/2018: IRRIGATION AND DEBRIDEMENT FOOT; Left     Comment:  Procedure: IRRIGATION AND DEBRIDEMENT FOOT, LEFT;                Surgeon: Gwyneth Revels, DPM;  Location: [facility] OR[facility];                Service: Podiatry;  Laterality: Left; 02/25/2016: LOWER EXTREMITY ANGIOGRAPHY; Right     Comment:  Procedure: Lower Extremity Angiography;  Surgeon: Annice Needy, MD;  Location: [facility];  Service:               Cardiovascular;  Laterality: Right; 02/27/2017: LOWER EXTREMITY ANGIOGRAPHY; Right     Comment:  Procedure: LOWER EXTREMITY ANGIOGRAPHY;  Surgeon: Annice Needy, MD;  Location: [facility];  Service:               Cardiovascular;  Laterality: Right; 08/06/2018: LOWER EXTREMITY ANGIOGRAPHY;  Left     Comment:  Procedure: LOWER EXTREMITY ANGIOGRAPHY;  Surgeon: Annice Needy, MD;  Location: [facility];  Service:               Cardiovascular;  Laterality: Left; 02/25/2016: LOWER EXTREMITY INTERVENTION     Comment:  Procedure: Lower Extremity Intervention;  Surgeon: Annice Needy, MD;  Location: [facility];  Service:               Cardiovascular;; No date: TOE AMPUTATION  BMI    Body Mass Index: 36.52 kg/m      Reproductive/Obstetrics negative OB ROS                            Anesthesia Physical Anesthesia Plan  ASA: III  Anesthesia Plan: General ETT   Post-op Pain Management:  Induction:   PONV Risk Score and Plan: Ondansetron, Dexamethasone, Midazolam and Treatment may vary due to age or medical condition  Airway Management Planned:   Additional Equipment:   Intra-op Plan:   Post-operative Plan:   Informed Consent: I have reviewed the patients History and Physical, chart, labs and discussed the procedure including the risks, benefits and alternatives for the proposed anesthesia with the patient or authorized representative who has indicated his/her understanding and acceptance.     Dental Advisory Given  Plan Discussed with: Anesthesiologist  Anesthesia Plan Comments:         Anesthesia Quick Evaluation

## 2019-02-01 NOTE — Transfer of Care (Signed)
Immediate Anesthesia Transfer of Care Note  Patient: Joseph Singh  Procedure(s) Performed: ARTHRODESIS FOOT;STJ (Left Foot) ARTHRODESIS METATARSAL (Left Toe)  Patient Location: PACU  Anesthesia Type:General  Level of Consciousness: awake, alert  and oriented  Airway & Oxygen Therapy: Patient Spontanous Breathing and Patient connected to face mask oxygen  Post-op Assessment: Report given to RN and Post -op Vital signs reviewed and stable  Post vital signs: Reviewed and stable  Last Vitals:  Vitals Value Taken Time  BP 113/65 02/01/19 1309  Temp    Pulse 76 02/01/19 1312  Resp 22 02/01/19 1312  SpO2 98 % 02/01/19 1312  Vitals shown include unvalidated device data.  Last Pain:  Vitals:   02/01/19 0613  TempSrc: Tympanic  PainSc: 3       Patients Stated Pain Goal: 0 (19/62/22 [id number])  Complications: No apparent anesthesia complications

## 2019-02-01 NOTE — H&P (Signed)
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  The various methods of treatment have been discussed with the patient.  No guarantees were given.  After consideration of risks, benefits and other options for treatment, the patient has consented to surgery.  I have reviewed the patients' chart and labs.     A history and physical examination was performed in my office.  The patient was reexamined.  There have been no changes to this history and physical examination.  Samara Deist A

## 2019-02-01 NOTE — Progress Notes (Signed)
Pharmacy Antibiotic Note  Joseph Singh is a 58 y.o. male admitted on 02/01/2019 with wound infection.  Pharmacy has been consulted for Cefazolin dosing.  Plan: Will order Cefazolin 2 gm IV q8h. F/u wound cx    Height: 6\' 4"  (193 cm) Weight: 300 lb (136.1 kg) IBW/kg (Calculated) : 86.8  Temp (24hrs), Avg:97.6 F (36.4 C), Min:97.5 F (36.4 C), Max:97.7 F (36.5 C)  No results for input(s): WBC, CREATININE, LATICACIDVEN, VANCOTROUGH, VANCOPEAK, VANCORANDOM, GENTTROUGH, GENTPEAK, GENTRANDOM, TOBRATROUGH, TOBRAPEAK, TOBRARND, AMIKACINPEAK, AMIKACINTROU, AMIKACIN in the last 168 hours.  CrCl cannot be calculated (Patient's most recent lab result is older than the maximum 21 days allowed.).    No Known Allergies  Antimicrobials this admission: Cefazolin  12/11 >>        >>    Dose adjustments this admission:    Microbiology results:   BCx:     UCx:      Sputum:      MRSA PCR:   12/11 Wound cx: pending  Thank you for allowing pharmacy to be a part of this patient's care.  Jonni Oelkers A 02/01/2019 1:51 PM

## 2019-02-01 NOTE — Consult Note (Signed)
Medical Consultation   Joseph Singh  [id number]  DOB: 27-Apr-1960  DOA: 02/01/2019  PCP: [name], [name], MD   Outpatient Specialists: Podiatry   Requesting physician: Dr. [name]  Reason for consultation: Medical Management   History of Present Illness: Joseph Singh is an 58 y.o. male with hx/o NIDDM, HTN, DL, PVD was admitted for surgery by Dr. [name] who underwent Charcot reconstruction of his left foot and ankle today.  He is admitted overnight for observation.  Islocation of tarsal joint of left foot.  He has history of left Charcot joint.  Patient denies any chest pain, shortness of breath, dizziness, or any symptoms.  Wife at bedside.  He only complaining of left foot pain.      Review of Systems:  ROS All systems reviewed and otherwise negative.   Past Medical History: Past Medical History:  Diagnosis Date  . Arthritis   . Diabetes mellitus without complication [name]   . History of kidney stones    h/o  . Hyperlipidemia   . Hypertension   . Neuropathy     Past Surgical History: Past Surgical History:  Procedure Laterality Date  . AMPUTATION TOE Left 08/10/2018   Procedure: RAY LEFT;  Surgeon: Gwyneth RevelsFowler, Justin, DPM;  Location: [facility];  Service: Podiatry;  Laterality: Left;  . IRRIGATION AND DEBRIDEMENT FOOT Left 10/26/2018   Procedure: IRRIGATION AND DEBRIDEMENT FOOT, LEFT;  Surgeon: Gwyneth RevelsFowler, Justin, DPM;  Location: [facility];  Service: Podiatry;  Laterality: Left;  . LOWER EXTREMITY ANGIOGRAPHY Right 02/25/2016   Procedure: Lower Extremity Angiography;  Surgeon: Annice NeedyJason S Dew, MD;  Location: [facility];  Service: Cardiovascular;  Laterality: Right;  . LOWER EXTREMITY ANGIOGRAPHY Right 02/27/2017   Procedure: LOWER EXTREMITY ANGIOGRAPHY;  Surgeon: Annice Needyew, Jason S, MD;  Location: [facility];  Service: Cardiovascular;  Laterality: Right;  . LOWER EXTREMITY ANGIOGRAPHY Left 08/06/2018   Procedure: LOWER EXTREMITY ANGIOGRAPHY;   Surgeon: Annice Needyew, Jason S, MD;  Location: [facility];  Service: Cardiovascular;  Laterality: Left;  . LOWER EXTREMITY INTERVENTION  02/25/2016   Procedure: Lower Extremity Intervention;  Surgeon: Annice NeedyJason S Dew, MD;  Location: [facility];  Service: Cardiovascular;;  . TOE AMPUTATION       Allergies:  No Known Allergies   Social History:  reports that he quit smoking about 5 months ago. His smoking use included cigarettes. He has a 17.50 pack-year smoking history. He has never used smokeless tobacco. He reports that he does not drink alcohol or use drugs.   Family History: Family History  Problem Relation Age of Onset  . Diabetes Mother   . Heart disease Father   . Diabetes Sister   . Diabetes Brother   . Cancer Sister        lung  . Cancer Sister        breast     Physical Exam: Vitals:   02/01/19 1424 02/01/19 1454 02/01/19 1505 02/01/19 1630  BP: 117/61 (!) 113/58 128/62 (!) 111/53  Pulse: 65 75 70 64  Resp: 13 13 16 18   Temp: 97.6 F (36.4 C)  97.6 F (36.4 C) 97.8 F (36.6 C)  TempSrc:      SpO2: 98% 99% 99% 100%  Weight:      Height:        Constitutional: Alert and awake, oriented x3, not in any acute distress. HEENT:PERLA, EOMI, ianicteric sclera,  Neck: neck appears normal, unable to assess jvd due to enlarged neck CVS: Regular, S1-S2 clear, no murmur rubs or gallops,  Respiratory:  clear to auscultation bilaterally, no wheezing, rales or rhonchi. Respiratory effort normal.  Abdomen: soft nontender, nondistended, normal bowel sounds Musculoskeletal: : LLE foot wrapped. No edema or cyanosis of right Neuro: Cranial nerves II-XII grossly intact Psych:Stable mood and affect Skin: warm and dry   Data reviewed:  I have personally reviewed following labs and imaging studies Labs:  CBC: Recent Labs  Lab 02/01/19 1556  WBC 10.2  HGB 12.1*  HCT 37.2*  MCV 86.5  PLT 261    Basic Metabolic Panel: Recent Labs  Lab 02/01/19 1559    CREATININE 0.88   GFR Estimated Creatinine Clearance: 136.2 mL/min (by C-G formula based on SCr of 0.88 mg/dL). Liver Function Tests: No results for input(s): AST, ALT, ALKPHOS, BILITOT, PROT, ALBUMIN in the last 168 hours. No results for input(s): LIPASE, AMYLASE in the last 168 hours. No results for input(s): AMMONIA in the last 168 hours. Coagulation profile No results for input(s): INR, PROTIME in the last 168 hours.  Cardiac Enzymes: No results for input(s): CKTOTAL, CKMB, CKMBINDEX, TROPONINI in the last 168 hours. BNP: Invalid input(s): POCBNP CBG: Recent Labs  Lab 02/01/19 0606 02/01/19 1318  GLUCAP 114* 164*   D-Dimer No results for input(s): DDIMER in the last 72 hours. Hgb A1c No results for input(s): HGBA1C in the last 72 hours. Lipid Profile No results for input(s): CHOL, HDL, LDLCALC, TRIG, CHOLHDL, LDLDIRECT in the last 72 hours. Thyroid function studies No results for input(s): TSH, T4TOTAL, T3FREE, THYROIDAB in the last 72 hours.  Invalid input(s): FREET3 Anemia work up No results for input(s): VITAMINB12, FOLATE, FERRITIN, TIBC, IRON, RETICCTPCT in the last 72 hours. Urinalysis    Component Value Date/Time   COLORURINE YELLOW (A) 02/27/2017 1150   APPEARANCEUR CLEAR (A) 02/27/2017 1150   LABSPEC 1.035 (H) 02/27/2017 1150   PHURINE 5.0 02/27/2017 1150   GLUCOSEU >=500 (A) 02/27/2017 1150   HGBUR NEGATIVE 02/27/2017 1150   BILIRUBINUR NEGATIVE 02/27/2017 1150   KETONESUR NEGATIVE 02/27/2017 1150   PROTEINUR NEGATIVE 02/27/2017 1150   NITRITE NEGATIVE 02/27/2017 1150   LEUKOCYTESUR TRACE (A) 02/27/2017 1150     Microbiology Recent Results (from the past 240 hour(s))  SARS CORONAVIRUS 2 (TAT 6-24 HRS) Nasopharyngeal Nasopharyngeal Swab     Status: None   Collection Time: 01/29/19  3:01 PM   Specimen: Nasopharyngeal Swab  Result Value Ref Range Status   SARS Coronavirus 2 NEGATIVE NEGATIVE Final    Comment: (NOTE) SARS-CoV-2 target nucleic  acids are NOT DETECTED. The SARS-CoV-2 RNA is generally detectable in upper and lower respiratory specimens during the acute phase of infection. Negative results do not preclude SARS-CoV-2 infection, do not rule out co-infections with other pathogens, and should not be used as the sole basis for treatment or other patient management decisions. Negative results must be combined with clinical observations, patient history, and epidemiological information. The expected result is Negative. Fact Sheet for Patients: HairSlick.no Fact Sheet for Healthcare Providers: quierodirigir.com This test is not yet approved or cleared by the [place] FDA and  has been authorized for detection and/or diagnosis of SARS-CoV-2 by FDA under an Emergency Use Authorization (EUA). This EUA will remain  in effect (meaning this test can be used) for the duration of the COVID-19 declaration under Section 56 4(b)(1) of the Act, 21 U.S.C. section 360bbb-3(b)(1), unless the authorization is terminated or revoked  sooner. Performed at [facility], [place] [address] [place], [place] [address]   Aerobic/Anaerobic Culture (surgical/deep wound)     Status: None (Preliminary result)   Collection Time: 02/01/19  8:41 AM   Specimen: Wound  Result Value Ref Range Status   Specimen Description   Final    WOUND Performed at [facility], [address] [place], [place] [address]    Special Requests LEFT SUBTAYLOR JOINT  Final   Gram Stain   Final    NO WBC SEEN NO ORGANISMS SEEN Performed at [facility], [place] [address] [place], [place] [address]    Culture PENDING  Incomplete   Report Status PENDING  Incomplete       Inpatient Medications:   Scheduled Meds: . [START ON 02/02/2019] amLODipine  10 mg Oral Daily  . aspirin EC  81 mg Oral Daily  . atorvastatin  40 mg Oral Daily  . [START ON 02/02/2019] clopidogrel  75 mg Oral Daily    . enoxaparin (LOVENOX) injection  40 mg Subcutaneous Q24H  . gabapentin  300 mg Oral QID  . [START ON 02/02/2019] glipiZIDE  10 mg Oral Q breakfast  . lisinopril  10 mg Oral Daily   And  . hydrochlorothiazide  12.5 mg Oral Daily  . linagliptin  5 mg Oral Daily  . metFORMIN  1,000 mg Oral BID WC  . [START ON 02/02/2019] metoprolol succinate  50 mg Oral Daily   Continuous Infusions: .  ceFAZolin (ANCEF) IV       Radiological Exams on Admission: DG C-Arm 1-60 Min  Result Date: 02/01/2019 CLINICAL DATA:  Left foot arthrodesis. EXAM: DG C-ARM 1-60 MIN; LEFT FOOT - COMPLETE 3+ VIEW CONTRAST:  None. FLUOROSCOPY TIME:  Fluoroscopy Time:  2 minutes 28 seconds. Number of Acquired Spot Images: 6. COMPARISON:  None. FINDINGS: Six intraoperative fluoroscopic images were obtained of the left foot. These demonstrate surgical fusion of the talocalcaneal joint as well as the talotarsal and metatarsal joints. IMPRESSION: Fluoroscopic guidance provided during left foot arthrodesis. Electronically Signed   By: Marijo Conception M.D.   On: 02/01/2019 12:21    Impression/Recommendations Active Problems:   Charcot ankle, left  1.Lt Charcot foot-s/p Lt Charcot reconstruction of his left foot and ankle Will observe overnight Plavix on hold Pain control  2.non-insulin-dependent DM- We will hold Metformin and glipizide Placed on sliding scale Check fingersticks  3.HTN-continue home meds amlodipine, metoprolol and lisinopril/hctz Monitor   4.PVD- on plavix, held, will restart in am  Continue on statin and asa Quit smoking in June 2020- congratulated him on this.  5. DVT ppx- on lovenox   Thank you for this consultation.  Our [facility] hospitalist team will follow the patient with you.   Time Spent: 45 minutes.   Nolberto Hanlon M.D. [facility] 02/01/2019, 5:03 PM

## 2019-02-01 NOTE — Op Note (Signed)
Operative note   Surgeon:Tineka Uriegas Lawyer: None    Preop diagnosis: 1.  Unstable dislocated talonavicular and subtalar joint 2.  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  Equinus left foot    Postop diagnosis: Same    Procedure:*1.  Open reduction talonavicular and subtalar joint 2.  Subtalar joint arthrodesis 3.  Talonavicular joint arthrodesis 4.  Navicular cuneiform arthrodesis 5.  Metatarsocuneiform arthrodesis 6.  Percutaneous tendo Achilles lengthening all left lower extremity    EBL: 200 mL    Anesthesia:local and general.  Local consisted of 30 mL of 0.25% bupivacaine with epinephrine infiltrated along the incision sites postoperatively.    Hemostasis: Thigh tourniquet inflated 250 mmHg for 120 minutes initially.  This was dropped for a total of 25 minutes he was reinflated for an additional 1 hour and 52 minutes.    Specimen: Deep wound culture left subtalar joint    Complications: None    Operative indications:Joseph Singh is an 58 y.o. that presents today for surgical intervention.  The risks/benefits/alternatives/complications have been discussed and consent has been given.    Procedure:  Patient was brought into the OR and placed on the operating table in thesupine position. After anesthesia was obtained theleft lower extremity was prepped and draped in usual sterile fashion.  Attention was initially directed to the medial aspect of the foot where a long incision was made from the medial malleolus to the midshaft of the first metatarsal.  Sharp and blunt dissection carried down to the periosteal region.  The anterior tibial tendon was noted and reflected throughout the entire procedure.  At this time the subperiosteal dissection was taken deep and the talonavicular joint dislocation was noted.  The talonavicular joint was then opened and the subtalar joint was also opened.  The subtalar joint was also noted to be  completely medially dislocated with the forefoot dislocated medially to the talar head.  The articular cartilage was removed from all areas.  This was taken down to the subchondral bone plate.  At this time attempts to reduce the talonavicular and subtalar joint were attempted.  The talar head was not able to be relocated to the talonavicular joint.  At this time a lateral incision was made overlying the talar head region.  Sharp and blunt dissection carried down to the talus and subtalar region.  The articular cartilage of the talar head was noted that this was completely removed at this time.  All residual articular cartilage in the subtalar joint was also removed at this time down to healthy bleeding bone.  There was still difficulty relocating the subtalar and talonavicular joint and the foot was found in equinus position.  At this time percutaneous tendo Achilles lengthening was performed at 1 3 and 5 cm from its insertion.  Good excursion was noted afterwards.  I was then able to relocate the talus to the talonavicular joint.  Prior to final relocation the entire talonavicular joint was prepped for arthrodesis with removal of the articular cartilage.  All areas taken down to bleeding bone.  At this time the navicular cuneiform joint was opened and articular cartilage was removed to the subchondral bone plate.  The first metatarsocuneiform joint was also opened and all articular cartilage was removed at this time.  All joints were then filled with Trinity bone graft.  A total of 10 mL of Trinity bone graft were used.  The subtalar joint and talonavicular joint were then stabilized with  guidewires and K wires.  At this time the plantar aspect of the left first MTPJ was opened and dissected down to the articular cartilage of the first metatarsal head.  The guidewire for the Paragon 28 joust beam was placed from the distal aspect of the first metatarsal head crossing the first met cuneiform navicular cuneiform and  talonavicular joint into the body of the talus.  Good alignment was noted in all planes.  At this time this was then drilled appropriately.  Next a 150 mm x 7.2 mm fully  threaded cannulated Joust beam was then driven and brought flush against the head of the metatarsal into the body of the talus.  Good stability was noted at this time.  Attention was then directed to the subtalar joint.  2 guidewires for the 7.0 mm cannulated Paragon 28 screws were placed from the posterior heel into the subtalar joint.  Good stability was noted and good alignment was noted after final fluoroscopy was performed.  All wounds were flushed with copious amounts of irrigation.  Throughout the procedure the tourniquet was dropped for approximately 30 minutes.  The area was infiltrated with Surgi-Flo with topical thrombin.  Layered closure was performed to all wounds with 2-0 Vicryl for the deeper layer.  A Hemovac drain was placed in the lateral incision.  3-0 Vicryl for the subcutaneous tissue.  Skin staples were used for the skin with the exception of the small ulcer on the lateral aspect of the foot which was sutured with nylon.  Also the percutaneous Achilles lengthening sites were sutured with nylon.  Patient was placed in a posterior splint with the foot held in a neutral 90 degree position.  I then placed a bone stimulator external to the ankle and foot.    Patient tolerated the procedure and anesthesia well.  Was transported from the OR to the PACU with all vital signs stable and vascular status intact. To be discharged per routine protocol.

## 2019-02-01 NOTE — H&P (Addendum)
Joseph Singh is an 58 y.o. male.   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  He will be admitted for observation overnight.  Plan for nonweightbearing and postop pain management.  Patient has drain intact and will have to reevaluate drain tomorrow.  I have asked hospitalist to consult to assist with medical management.  Past Medical History:  Diagnosis Date  . Arthritis   . Diabetes mellitus without complication ([place])   . History of kidney stones    h/o  . Hyperlipidemia   . Hypertension   . Neuropathy     Past Surgical History:  Procedure Laterality Date  . AMPUTATION TOE Left 08/10/2018   Procedure: RAY LEFT;  Surgeon: Samara Deist, DPM;  Location: [facility];  Service: Podiatry;  Laterality: Left;  . IRRIGATION AND DEBRIDEMENT FOOT Left 10/26/2018   Procedure: IRRIGATION AND DEBRIDEMENT FOOT, LEFT;  Surgeon: Samara Deist, DPM;  Location: [facility];  Service: Podiatry;  Laterality: Left;  . LOWER EXTREMITY ANGIOGRAPHY Right 02/25/2016   Procedure: Lower Extremity Angiography;  Surgeon: Algernon Huxley, MD;  Location: [facility];  Service: Cardiovascular;  Laterality: Right;  . LOWER EXTREMITY ANGIOGRAPHY Right 02/27/2017   Procedure: LOWER EXTREMITY ANGIOGRAPHY;  Surgeon: Algernon Huxley, MD;  Location: [facility];  Service: Cardiovascular;  Laterality: Right;  . LOWER EXTREMITY ANGIOGRAPHY Left 08/06/2018   Procedure: LOWER EXTREMITY ANGIOGRAPHY;  Surgeon: Algernon Huxley, MD;  Location: [facility];  Service: Cardiovascular;  Laterality: Left;  . LOWER EXTREMITY INTERVENTION  02/25/2016   Procedure: Lower Extremity Intervention;  Surgeon: Algernon Huxley, MD;  Location: [facility];  Service: Cardiovascular;;  . TOE AMPUTATION      Family History  Problem Relation Age of Onset  . Diabetes Mother   . Heart disease  Father   . Diabetes Sister   . Diabetes Brother   . Cancer Sister        lung  . Cancer Sister        breast   Social History:  reports that he quit smoking about 5 months ago. His smoking use included cigarettes. He has a 17.50 pack-year smoking history. He has never used smokeless tobacco. He reports that he does not drink alcohol or use drugs.  Allergies: No Known Allergies  ROS  Medications Prior to Admission  Medication Sig Dispense Refill  . amLODipine (NORVASC) 10 MG tablet Take 1 tablet (10 mg total) by mouth daily. 90 tablet 3  . aspirin EC 81 MG tablet Take 1 tablet (81 mg total) by mouth daily. 150 tablet 2  . atorvastatin (LIPITOR) 40 MG tablet TAKE ONE (1) TABLET EACH DAY (Patient taking differently: Take 40 mg by mouth daily. ) 90 tablet 3  . clopidogrel (PLAVIX) 75 MG tablet TAKE ONE (1) TABLET EACH DAY (Patient taking differently: Take 75 mg by mouth daily. ) 90 tablet 3  . gabapentin (NEURONTIN) 300 MG capsule TAKE 1 CAPSULE (300 MG TOTAL) BY MOUTH 4 (FOUR) TIMES DAILY. 360 capsule 2  . glipiZIDE (GLUCOTROL XL) 10 MG 24 hr tablet TAKE ONE (1) TABLET EACH DAY WITH BREAKFAST (Patient taking differently: Take 10 mg by mouth daily with breakfast. ) 90 tablet 3  . JARDIANCE 25 MG TABS tablet TAKE 1 TABLET BY MOUTH EVERY DAY (Patient taking differently: Take 25 mg by mouth daily. ) 90 tablet  3  . linagliptin (TRADJENTA) 5 MG TABS tablet Take 1 tablet (5 mg total) by mouth daily. 90 tablet 1  . lisinopril-hydrochlorothiazide (ZESTORETIC) 10-12.5 MG tablet Take 1 tablet by mouth daily. 90 tablet 3  . metFORMIN (GLUCOPHAGE) 1000 MG tablet TAKE 1 TABLET BY MOUTH TWICE DAILY WITH A MEAL (Patient taking differently: Take 1,000 mg by mouth 2 (two) times daily with a meal. ) 180 tablet 3  . metoprolol succinate (TOPROL-XL) 50 MG 24 hr tablet Take 1 tablet (50 mg total) by mouth daily. Take with or immediately following a meal. 90 tablet 3  . traMADol (ULTRAM) 50 MG tablet Take 1 tablet  (50 mg total) by mouth 3 (three) times daily. 90 tablet 0  . blood glucose meter kit and supplies KIT E11.51, check 2x daily, dispense based on patient and insurance preference 1 each 0  . glucose blood (ONE TOUCH ULTRA TEST) test strip Check blood sugar twice daily Dx code: E11.9 200 each 0  . HYDROcodone-acetaminophen (NORCO) 5-325 MG tablet Take 1 tablet by mouth every 6 (six) hours as needed for moderate pain. (Patient not taking: Reported on 01/29/2019) 30 tablet 0    Physical Exam: General: Alert and oriented.  No apparent distress. Vascular:  Left foot:Dorsalis Pedis:  present Posterior Tibial:  present  Right foot: Dorsalis Pedis:  present Posterior Tibial:  present Neuro: Absent protective sensation Derm: Patient status post left foot reconstruction.  Large medial and lateral foot incisions.  Drain is coursing from the lateral aspect of the incision site. Ortho/MS: Patient in posterior splint on the left lower extremity.  Results for orders placed or performed during the hospital encounter of 02/01/19 (from the past 48 hour(s))  Glucose, capillary     Status: Abnormal   Collection Time: 02/01/19  6:06 AM  Result Value Ref Range   Glucose-Capillary 114 (H) 70 - 99 mg/dL  Aerobic/Anaerobic Culture (surgical/deep wound)     Status: None (Preliminary result)   Collection Time: 02/01/19  8:41 AM   Specimen: Wound  Result Value Ref Range   Specimen Description      WOUND Performed at [facility], [address], [place], [place] [address]    Special Requests      LEFT SUBTAYLOR JOINT Performed at [facility], [place] [address], [place], [place] [address]    Gram Stain PENDING    Culture PENDING    Report Status PENDING   Glucose, capillary     Status: Abnormal   Collection Time: 02/01/19  1:18 PM  Result Value Ref Range   Glucose-Capillary 164 (H) 70 - 99 mg/dL   DG C-Arm 1-60 Min  Result Date: 02/01/2019 CLINICAL DATA:  Left foot arthrodesis. EXAM: DG  C-ARM 1-60 MIN; LEFT FOOT - COMPLETE 3+ VIEW CONTRAST:  None. FLUOROSCOPY TIME:  Fluoroscopy Time:  2 minutes 28 seconds. Number of Acquired Spot Images: 6. COMPARISON:  None. FINDINGS: Six intraoperative fluoroscopic images were obtained of the left foot. These demonstrate surgical fusion of the talocalcaneal joint as well as the talotarsal and metatarsal joints. IMPRESSION: Fluoroscopic guidance provided during left foot arthrodesis. Electronically Signed   By: Marijo Conception M.D.   On: 02/01/2019 12:21    Blood pressure (!) 115/56, pulse 77, temperature (!) 97.5 F (36.4 C), resp. rate 15, height '6\' 4"'$  (1.93 m), weight 136.1 kg, SpO2 98 %.  Assessment/Plan We will admit patient.  Will restart all home medications with the exception of Plavix today.  He can  restart this tomorrow.  He has a drain in place and we will pull this tomorrow.  Plan to discharge tomorrow morning.   Samara Deist A 02/01/2019, 1:34 PM

## 2019-02-01 NOTE — Anesthesia Post-op Follow-up Note (Signed)
Anesthesia QCDR form completed.        

## 2019-02-01 NOTE — Anesthesia Procedure Notes (Signed)
Procedure Name: Intubation Date/Time: 02/01/2019 7:35 AM Performed by: Willette Alma, CRNA Pre-anesthesia Checklist: Patient identified, Patient being monitored, Timeout performed, Emergency Drugs available and Suction available Patient Re-evaluated:Patient Re-evaluated prior to induction Oxygen Delivery Method: Circle system utilized Preoxygenation: Pre-oxygenation with 100% oxygen Induction Type: IV induction Ventilation: Mask ventilation without difficulty Laryngoscope Size: Mac, McGraph and 4 Grade View: Grade I Tube type: Oral Tube size: 7.5 mm Number of attempts: 1 Airway Equipment and Method: Stylet Placement Confirmation: ETT inserted through vocal cords under direct vision,  positive ETCO2 and breath sounds checked- equal and bilateral Secured at: 21 cm Tube secured with: Tape Dental Injury: Teeth and Oropharynx as per pre-operative assessment

## 2019-02-02 DIAGNOSIS — Z7982 Long term (current) use of aspirin: Secondary | ICD-10-CM | POA: Diagnosis not present

## 2019-02-02 DIAGNOSIS — M216X2 Other acquired deformities of left foot: Secondary | ICD-10-CM | POA: Diagnosis not present

## 2019-02-02 DIAGNOSIS — Z87891 Personal history of nicotine dependence: Secondary | ICD-10-CM | POA: Diagnosis not present

## 2019-02-02 DIAGNOSIS — M19072 Primary osteoarthritis, left ankle and foot: Secondary | ICD-10-CM | POA: Diagnosis not present

## 2019-02-02 DIAGNOSIS — Z7984 Long term (current) use of oral hypoglycemic drugs: Secondary | ICD-10-CM | POA: Diagnosis not present

## 2019-02-02 DIAGNOSIS — I10 Essential (primary) hypertension: Secondary | ICD-10-CM | POA: Diagnosis not present

## 2019-02-02 DIAGNOSIS — E11610 Type 2 diabetes mellitus with diabetic neuropathic arthropathy: Secondary | ICD-10-CM | POA: Diagnosis not present

## 2019-02-02 DIAGNOSIS — I739 Peripheral vascular disease, unspecified: Secondary | ICD-10-CM | POA: Diagnosis not present

## 2019-02-02 DIAGNOSIS — Z7902 Long term (current) use of antithrombotics/antiplatelets: Secondary | ICD-10-CM | POA: Diagnosis not present

## 2019-02-02 DIAGNOSIS — E1151 Type 2 diabetes mellitus with diabetic peripheral angiopathy without gangrene: Secondary | ICD-10-CM | POA: Diagnosis not present

## 2019-02-02 DIAGNOSIS — E119 Type 2 diabetes mellitus without complications: Secondary | ICD-10-CM | POA: Diagnosis not present

## 2019-02-02 DIAGNOSIS — E1140 Type 2 diabetes mellitus with diabetic neuropathy, unspecified: Secondary | ICD-10-CM | POA: Diagnosis not present

## 2019-02-02 DIAGNOSIS — Z79899 Other long term (current) drug therapy: Secondary | ICD-10-CM | POA: Diagnosis not present

## 2019-02-02 LAB — HEMOGLOBIN AND HEMATOCRIT, BLOOD
HCT: 34.2 % — ABNORMAL LOW (ref 39.0–52.0)
Hemoglobin: 11.1 g/dL — ABNORMAL LOW (ref 13.0–17.0)

## 2019-02-02 LAB — URINALYSIS, COMPLETE (UACMP) WITH MICROSCOPIC
Bacteria, UA: NONE SEEN
Bilirubin Urine: NEGATIVE
Glucose, UA: >=500 mg/dL — AB
Hgb urine dipstick: NEGATIVE
Ketones, ur: NEGATIVE mg/dL
Leukocytes,Ua: NEGATIVE
Nitrite: NEGATIVE
Protein, ur: NEGATIVE mg/dL
Specific Gravity, Urine: 1.03 (ref 1.005–1.030)
Squamous Epithelial / HPF: NONE SEEN (ref 0–5)
pH: 5 (ref 5.0–8.0)

## 2019-02-02 LAB — GLUCOSE, CAPILLARY: Glucose-Capillary: 159 mg/dL — ABNORMAL HIGH (ref 70–99)

## 2019-02-02 MED ORDER — HYDROCODONE-ACETAMINOPHEN 5-325 MG PO TABS: 1.0000 | ORAL_TABLET | ORAL | 0 refills | Status: DC | PRN

## 2019-02-02 NOTE — Final Progress Note (Signed)
Subjective: Patient seen.  Had some significant pain in the left foot and leg last night.  Did not sleep much.  Objective: Bandages dry and intact.  Some mild darker bleeding is noted in the suction tubing.  Upon removal no clear evidence of any bleeding through the bandage after reexamination about 10 minutes later.  Assessment: Stable status post Charcot reconstruction left foot.  Plan: Drain tubing was pulled.  Patient given instructions for absolutely no weightbearing on the left foot.  Prescription for Norco for pain.  Patient is stable for discharge.

## 2019-02-02 NOTE — TOC Transition Note (Signed)
Transition of Care [facility]) - CM/SW Discharge Note   Patient Details  Name: Joseph Singh MRN: [id number] Date of Birth: 1960-03-23  [facility] of [facility]) CM/SW Contact:  Marshell Garfinkel, RN Phone Number: 02/02/2019, 8:24 AM   Clinical Narrative:    RNCM spoke with patient and he states he will call transportation when nurse gives him the go-ahead. He has a knee scooter and several walkers to maintain non-weightbearing status. He states he will have transportation to and from appointments. He denies RNCM needs.    Final next level of care: Home/Self Care     Patient Goals and CMS Choice   CMS Medicare.gov Compare Post Acute Care list provided to:: Patient    Discharge Placement                       Discharge Plan and Services                                     Social Determinants of Health (SDOH) Interventions     Readmission Risk Interventions No flowsheet data found.

## 2019-02-02 NOTE — Discharge Summary (Addendum)
Physician Discharge Summary  Patient ID: Joseph Singh MRN: [id number] DOB/AGE: 03/31/1960 58 y.o.  Admit date: 02/01/2019 Discharge date: 02/02/2019  Admission Diagnoses:  Discharge Diagnoses:  Active Problems:   Charcot ankle, left   Discharged Condition: good  Hospital Course: This is a 58 year old male with Charcot collapse of his left foot and rear foot.  Patient underwent surgery on 02/01/2019 for reconstruction.  Patient was admitted overnight for observation.  He did experience some pain overnight.  Drain from the leg was pulled this morning.  Patient overall is stable and ready for discharge this morning.  Consults: Internal medicine for medical management.  Significant Diagnostic Studies: None  Treatments: surgery: Charcot reconstruction with internal fixation.  Discharge Exam: Blood pressure (!) 117/59, pulse 81, temperature 99.1 F (37.3 C), temperature source Oral, resp. rate 16, height 6\' 4"  (1.93 m), weight 136.1 kg, SpO2 96 %. The bandage on the left foot is dry and intact.  Mild somewhat darkened blood noted in the drain tubing and collection reservoir.  Upon pulling of the drain did not appear to be any active bleeding through the bandaging.  H&H a little decreased but stable.  Disposition: Discharge disposition: 01-Home or Self Care     Patient discharged in stable condition.  Prescription for Norco for postoperative pain.  Follow-up next week as scheduled with Dr. Vickki Muff.     Signed: Durward Fortes 02/02/2019, 8:01 AM

## 2019-02-02 NOTE — Progress Notes (Signed)
Patient discharging home. Instructions and prescriptions given to patient, IV removed. Verbalized understanding. Incentive spiromety given to patient.

## 2019-02-02 NOTE — Care Management Obs Status (Signed)
[place] NOTIFICATION   Patient Details  Name: Joseph Singh MRN: [id number] Date of Birth: 16-Feb-1961   Medicare Observation Status Notification Given:  No  <24 hours  Marshell Garfinkel, RN 02/02/2019, 8:26 AM

## 2019-02-02 NOTE — Discharge Instructions (Signed)
1.  Elevate the left lower extremity on 2 pillows.  2.  Absolutely no weight on the left foot or leg.  3.  Keep the bandage on the left foot clean, dry, and do not remove.  4.  Take 1 pain pill, Norco, every 4 hours if needed for pain.

## 2019-02-02 NOTE — Progress Notes (Signed)
PROGRESS NOTE    DONTRELL STUCK  UYQ:[id number] DOB: 1960-10-03 DOA: 02/01/2019 PCP: Leone Haven, MD    Brief Narrative:  MAXIMUS HOFFERT is an 58 y.o. male with hx/o NIDDM, HTN, DL, PVD was admitted for surgery by Dr. Vickki Muff who underwent Charcot reconstruction of his left foot and ankle today.  He is admitted overnight for observation.  Islocation of tarsal joint of left foot.  He has history of left Charcot joint.  Patient denies any chest pain, shortness of breath, dizziness, or any symptoms.  Wife at bedside.  He only complaining of left foot pain.     Subjective: Pt with pain of LLE. Febrile last night. Denies abd pain,sob, dysuria, fever, or chills  Objective: Vitals:   02/01/19 1954 02/01/19 2320 02/02/19 0450 02/02/19 0813  BP: 122/63 126/63 (!) 117/59 121/63  Pulse: 82 86 81 82  Resp: 18 17 16 17   Temp: 99.2 F (37.3 C) (!) 101.1 F (38.4 C) 99.1 F (37.3 C) 100 F (37.8 C)  TempSrc: Oral Oral Oral Oral  SpO2: 97% 92% 96% 100%  Weight:      Height:        Intake/Output Summary (Last 24 hours) at 02/02/2019 0858 Last data filed at 02/02/2019 0813 Gross per 24 hour  Intake 988.06 ml  Output 3015 ml  Net -2026.94 ml   Filed Weights   02/01/19 0613  Weight: 136.1 kg    Examination:  General exam: Appears calm and comfortable  Respiratory system: Clear to auscultation. Respiratory effort normal. Cardiovascular system: S1 & S2 heard, RRR. No JVD, murmurs, rubs, gallops or clicks.  Gastrointestinal system: Abdomen is nondistended, soft and nontender. Normal bowel sounds heard. Central nervous system: Alert and oriented. No focal neurological deficits. Extremities: LLE wrapped, rle minimal edema Psychiatry: Judgement and insight appear normal. Mood & affect appropriate.     Data Reviewed: I have personally reviewed following labs and imaging studies  CBC: Recent Labs  Lab 02/01/19 1556 02/02/19 0717  WBC 10.2  --   HGB 12.1* 11.1*  HCT 37.2*  34.2*  MCV 86.5  --   PLT 261  --    Basic Metabolic Panel: Recent Labs  Lab 02/01/19 1559  CREATININE 0.88   GFR: Estimated Creatinine Clearance: 136.2 mL/min (by C-G formula based on SCr of 0.88 mg/dL). Liver Function Tests: No results for input(s): AST, ALT, ALKPHOS, BILITOT, PROT, ALBUMIN in the last 168 hours. No results for input(s): LIPASE, AMYLASE in the last 168 hours. No results for input(s): AMMONIA in the last 168 hours. Coagulation Profile: No results for input(s): INR, PROTIME in the last 168 hours. Cardiac Enzymes: No results for input(s): CKTOTAL, CKMB, CKMBINDEX, TROPONINI in the last 168 hours. BNP (last 3 results) No results for input(s): PROBNP in the last 8760 hours. HbA1C: Recent Labs    02/01/19 1556  HGBA1C 7.0*   CBG: Recent Labs  Lab 02/01/19 0606 02/01/19 1318 02/01/19 2101 02/02/19 0816  GLUCAP 114* 164* 187* 159*   Lipid Profile: No results for input(s): CHOL, HDL, LDLCALC, TRIG, CHOLHDL, LDLDIRECT in the last 72 hours. Thyroid Function Tests: No results for input(s): TSH, T4TOTAL, FREET4, T3FREE, THYROIDAB in the last 72 hours. Anemia Panel: No results for input(s): VITAMINB12, FOLATE, FERRITIN, TIBC, IRON, RETICCTPCT in the last 72 hours. Sepsis Labs: No results for input(s): PROCALCITON, LATICACIDVEN in the last 168 hours.  Recent Results (from the past 240 hour(s))  SARS CORONAVIRUS 2 (TAT 6-24 HRS) Nasopharyngeal Nasopharyngeal Swab  Status: None   Collection Time: 01/29/19  3:01 PM   Specimen: Nasopharyngeal Swab  Result Value Ref Range Status   SARS Coronavirus 2 NEGATIVE NEGATIVE Final    Comment: (NOTE) SARS-CoV-2 target nucleic acids are NOT DETECTED. The SARS-CoV-2 RNA is generally detectable in upper and lower respiratory specimens during the acute phase of infection. Negative results do not preclude SARS-CoV-2 infection, do not rule out co-infections with other pathogens, and should not be used as the sole basis  for treatment or other patient management decisions. Negative results must be combined with clinical observations, patient history, and epidemiological information. The expected result is Negative. Fact Sheet for Patients: HairSlick.no Fact Sheet for Healthcare Providers: quierodirigir.com This test is not yet approved or cleared by the [place] FDA and  has been authorized for detection and/or diagnosis of SARS-CoV-2 by FDA under an Emergency Use Authorization (EUA). This EUA will remain  in effect (meaning this test can be used) for the duration of the COVID-19 declaration under Section 56 4(b)(1) of the Act, 21 U.S.C. section 360bbb-3(b)(1), unless the authorization is terminated or revoked sooner. Performed at [facility], [address] [place], [place] [address]   Aerobic/Anaerobic Culture (surgical/deep wound)     Status: None (Preliminary result)   Collection Time: 02/01/19  8:41 AM   Specimen: Wound  Result Value Ref Range Status   Specimen Description   Final    WOUND Performed at [facility], [address], [place], [place] [address]    Special Requests LEFT SUBTAYLOR JOINT  Final   Gram Stain   Final    NO WBC SEEN NO ORGANISMS SEEN Performed at [facility], [address], [place], [place] [address]    Culture PENDING  Incomplete   Report Status PENDING  Incomplete         Radiology Studies: DG Foot Complete Left  Result Date: 02/01/2019 CLINICAL DATA:  Left foot arthrodesis. EXAM: DG C-ARM 1-60 MIN; LEFT FOOT - COMPLETE 3+ VIEW CONTRAST:  None. FLUOROSCOPY TIME:  Fluoroscopy Time:  2 minutes 28 seconds. Number of Acquired Spot Images: 6. COMPARISON:  None. FINDINGS: Six intraoperative fluoroscopic images were obtained of the left foot. These demonstrate surgical fusion of the talocalcaneal joint as well as the talotarsal and metatarsal joints. IMPRESSION: Fluoroscopic  guidance provided during left foot arthrodesis. Electronically Signed   By: Lupita Raider M.D.   On: 02/01/2019 12:21   DG C-Arm 1-60 Min  Result Date: 02/01/2019 CLINICAL DATA:  Left foot arthrodesis. EXAM: DG C-ARM 1-60 MIN; LEFT FOOT - COMPLETE 3+ VIEW CONTRAST:  None. FLUOROSCOPY TIME:  Fluoroscopy Time:  2 minutes 28 seconds. Number of Acquired Spot Images: 6. COMPARISON:  None. FINDINGS: Six intraoperative fluoroscopic images were obtained of the left foot. These demonstrate surgical fusion of the talocalcaneal joint as well as the talotarsal and metatarsal joints. IMPRESSION: Fluoroscopic guidance provided during left foot arthrodesis. Electronically Signed   By: Lupita Raider M.D.   On: 02/01/2019 12:21   DG C-Arm 1-60 Min  Result Date: 02/01/2019 CLINICAL DATA:  Left foot arthrodesis. EXAM: DG C-ARM 1-60 MIN; LEFT FOOT - COMPLETE 3+ VIEW CONTRAST:  None. FLUOROSCOPY TIME:  Fluoroscopy Time:  2 minutes 28 seconds. Number of Acquired Spot Images: 6. COMPARISON:  None. FINDINGS: Six intraoperative fluoroscopic images were obtained of the left foot. These demonstrate surgical fusion of the talocalcaneal joint as well as the talotarsal and metatarsal joints. IMPRESSION: Fluoroscopic guidance provided during  left foot arthrodesis. Electronically Signed   By: Lupita RaiderJames  Green Jr M.D.   On: 02/01/2019 12:21   DG C-Arm 1-60 Min  Result Date: 02/01/2019 CLINICAL DATA:  Left foot arthrodesis. EXAM: DG C-ARM 1-60 MIN; LEFT FOOT - COMPLETE 3+ VIEW CONTRAST:  None. FLUOROSCOPY TIME:  Fluoroscopy Time:  2 minutes 28 seconds. Number of Acquired Spot Images: 6. COMPARISON:  None. FINDINGS: Six intraoperative fluoroscopic images were obtained of the left foot. These demonstrate surgical fusion of the talocalcaneal joint as well as the talotarsal and metatarsal joints. IMPRESSION: Fluoroscopic guidance provided during left foot arthrodesis. Electronically Signed   By: Lupita RaiderJames  Green Jr M.D.   On: 02/01/2019 12:21    DG C-Arm 1-60 Min  Result Date: 02/01/2019 CLINICAL DATA:  Left foot arthrodesis. EXAM: DG C-ARM 1-60 MIN; LEFT FOOT - COMPLETE 3+ VIEW CONTRAST:  None. FLUOROSCOPY TIME:  Fluoroscopy Time:  2 minutes 28 seconds. Number of Acquired Spot Images: 6. COMPARISON:  None. FINDINGS: Six intraoperative fluoroscopic images were obtained of the left foot. These demonstrate surgical fusion of the talocalcaneal joint as well as the talotarsal and metatarsal joints. IMPRESSION: Fluoroscopic guidance provided during left foot arthrodesis. Electronically Signed   By: Lupita RaiderJames  Green Jr M.D.   On: 02/01/2019 12:21        Scheduled Meds: . amLODipine  10 mg Oral Daily  . aspirin EC  81 mg Oral Daily  . atorvastatin  40 mg Oral Daily  . clopidogrel  75 mg Oral Daily  . enoxaparin (LOVENOX) injection  40 mg Subcutaneous Q24H  . gabapentin  300 mg Oral QID  . lisinopril  10 mg Oral Daily   And  . hydrochlorothiazide  12.5 mg Oral Daily  . insulin aspart  0-15 Units Subcutaneous TID WC  . insulin aspart  0-5 Units Subcutaneous QHS  . metoprolol succinate  50 mg Oral Daily   Continuous Infusions: . sodium chloride 10 mL/hr at 02/02/19 0730  .  ceFAZolin (ANCEF) IV Stopped (02/02/19 [id number])    Assessment & Plan:   Active Problems:   Charcot ankle, left  1.Lt Charcot foot-s/p Lt Charcot reconstruction of his left foot and ankle Will observe overnight Plavix can be restarted when deemed safe with podiatry Pain control  2.non-insulin-dependent DM- We will hold Metformin and glipizide Placed on sliding scale Check fingersticks Can resume home meds upon discharge  3.HTN-continue home meds amlodipine, metoprolol and lisinopril/hctz Monitor   4.PVD- on plavix, held, resume when deemed safe by primary team Continue on statin and asa Quit smoking in June 2020  5.Post Op fever- asx. Ck UA. Likely atelectasis since post op. Give incentive spirometer.  Continue to monitor  6. DVT ppx- on  lovenox          LOS: 0 days   Time spent: 40 min with >50% on coc    Lynn ItoSahar Antionne Enrique, MD [facility] Pager [phone]  If 7PM-7AM, please contact night-coverage [email or web address] Password [email or web address] 02/02/2019, 8:58 AM

## 2019-02-03 ENCOUNTER — Telehealth: Payer: Self-pay | Admitting: Family Medicine

## 2019-02-03 MED ORDER — CLINDAMYCIN HCL 300 MG PO CAPS: 300.0000 mg | ORAL_CAPSULE | Freq: Four times a day (QID) | ORAL | 0 refills | Status: DC

## 2019-02-03 NOTE — Telephone Encounter (Signed)
Culture results received from the hospital.  I called the patient to discuss.  It grew out rare staph epidermidis.  He notes he has been on doxycycline for a number of weeks.  He notes the foot is bandaged and cannot tell if there is any erythema or drainage though he has not had any fevers.  The culture is resistant to tetracyclines and thus I will switch to clindamycin.  He was encouraged to pick this up as soon as he is able to.  I will have the [facility] contact the patient's podiatrist office to make sure that they get these results and advise that he is now on clindamycin.  I will also have her get him set up for follow-up with me sometime in the next couple of weeks.  Patient notes he sees podiatry on Thursday.  Gae Bon, can you contact [facility] to make sure that Dr. Vickki Muff received the patient's culture results.  Please let them know that I started the patient on clindamycin based on his culture results. You can speak with the nurse to send a message to Dr Vickki Muff. Please also get him scheduled for virtual follow-up with me sometime in the next couple of weeks.  Thanks.

## 2019-02-04 NOTE — Telephone Encounter (Signed)
I called and spoke with Dr. Alvera Singh nurse and I faxed her the culture results and I informed the nurse of the antibiotic given to the patient by Dr. Caryl Bis and I called and scheduled the patient for a telephone visit with the provider.  Graycee Greeson,cma

## 2019-02-04 NOTE — Anesthesia Postprocedure Evaluation (Signed)
Anesthesia Post Note  Patient: Joseph Singh  Procedure(s) Performed: ARTHRODESIS FOOT;STJ (Left Foot) ARTHRODESIS METATARSAL (Left Toe)  Patient location during evaluation: PACU Anesthesia Type: General Level of consciousness: awake and alert Pain management: pain level controlled Vital Signs Assessment: post-procedure vital signs reviewed and stable Respiratory status: spontaneous breathing, nonlabored ventilation and respiratory function stable Cardiovascular status: blood pressure returned to baseline and stable Postop Assessment: no apparent nausea or vomiting Anesthetic complications: no     Last Vitals:  Vitals:   02/02/19 0450 02/02/19 0813  BP: (!) 117/59 121/63  Pulse: 81 82  Resp: 16 17  Temp: 37.3 C 37.8 C  SpO2: 96% 100%    Last Pain:  Vitals:   02/02/19 0813  TempSrc: Oral  PainSc:                  Durenda Hurt

## 2019-02-05 DIAGNOSIS — S93315D Dislocation of tarsal joint of left foot, subsequent encounter: Secondary | ICD-10-CM | POA: Diagnosis not present

## 2019-02-06 LAB — AEROBIC/ANAEROBIC CULTURE W GRAM STAIN (SURGICAL/DEEP WOUND): Gram Stain: NONE SEEN

## 2019-02-11 ENCOUNTER — Telehealth: Payer: Self-pay | Admitting: Family Medicine

## 2019-02-11 ENCOUNTER — Other Ambulatory Visit: Payer: Self-pay

## 2019-02-11 MED ORDER — METFORMIN HCL 1000 MG PO TABS: ORAL_TABLET | ORAL | 0 refills | Status: DC

## 2019-02-11 NOTE — Telephone Encounter (Signed)
Pt needs a refill on metFORMIN (GLUCOPHAGE) 1000 MG tablet sent to [facility]

## 2019-02-11 NOTE — Telephone Encounter (Signed)
Patient called and stated he would be running out of metformin before his visit with you on 02/18/2019, I sent over a 30 day supply until his visit and I called and let him know this and he was ok with that.  Sharilyn Geisinger,cma

## 2019-02-13 ENCOUNTER — Other Ambulatory Visit: Payer: Self-pay

## 2019-02-13 DIAGNOSIS — G8929 Other chronic pain: Secondary | ICD-10-CM

## 2019-02-13 MED ORDER — TRAMADOL HCL 50 MG PO TABS: 50.0000 mg | ORAL_TABLET | Freq: Three times a day (TID) | ORAL | 0 refills | Status: DC

## 2019-02-18 ENCOUNTER — Other Ambulatory Visit: Payer: Self-pay

## 2019-02-18 ENCOUNTER — Ambulatory Visit (INDEPENDENT_AMBULATORY_CARE_PROVIDER_SITE_OTHER): Payer: Medicare Other | Admitting: Family Medicine

## 2019-02-18 DIAGNOSIS — Z87891 Personal history of nicotine dependence: Secondary | ICD-10-CM

## 2019-02-18 DIAGNOSIS — I10 Essential (primary) hypertension: Secondary | ICD-10-CM | POA: Diagnosis not present

## 2019-02-18 DIAGNOSIS — Z1211 Encounter for screening for malignant neoplasm of colon: Secondary | ICD-10-CM | POA: Diagnosis not present

## 2019-02-18 DIAGNOSIS — E119 Type 2 diabetes mellitus without complications: Secondary | ICD-10-CM

## 2019-02-18 DIAGNOSIS — M14672 Charcot's joint, left ankle and foot: Secondary | ICD-10-CM | POA: Diagnosis not present

## 2019-02-18 NOTE — Assessment & Plan Note (Signed)
Doing well status post surgery.  He has completed his antibiotics.  Discussed signs of infection and that he would need to contact his podiatrist or [place] regarding this.

## 2019-02-18 NOTE — Assessment & Plan Note (Signed)
Congratulated on smoking cessation.  Discussed lung cancer screening with CT scan.  He wants to wait on his foot to heal.  We can discuss this at a future visit.

## 2019-02-18 NOTE — Progress Notes (Signed)
Virtual Visit via video Note  This visit type was conducted due to national recommendations for restrictions regarding the COVID-19 pandemic (e.g. social distancing).  This format is felt to be most appropriate for this patient at this time.  All issues noted in this document were discussed and addressed.  No physical exam was performed (except for noted visual exam findings with Video Visits).   I connected with Joseph Singh today at  3:15 PM EST by a video enabled telemedicine application or telephone and verified that I am speaking with the correct person using two identifiers. Location patient: home Location provider: work  Persons participating in the virtual visit: patient, provider, Alim Cattell (wife)  I discussed the limitations, risks, security and privacy concerns of performing an evaluation and management service by telephone and the availability of in person appointments. I also discussed with the patient that there may be a patient responsible charge related to this service. The patient expressed understanding and agreed to proceed.  Interactive audio and video telecommunications were attempted between this provider and patient, however failed, due to patient having technical difficulties OR patient did not have access to video capability.  We continued and completed visit with audio only.   Reason for visit: follow-up  HPI: HYPERTENSION  Disease Monitoring  Home BP Monitoring 125-130/70. Chest pain-no.    Dyspnea-no. Medications  Compliance-taking amlodipine, lisinopril, HCTZ, metoprolol.   Edema-no.  DIABETES Disease Monitoring: Blood Sugar ranges-140-150.  Most recent A1c was 7.0. Polyuria/phagia/dipsia- no      Optho- due Medications: Compliance-taking Jardiance, Tradjenta, Metformin, and glipizide hypoglycemic symptoms-no.  Charcot ankle: Patient recently underwent surgery for this.  He notes he is healing quite well.  We put him on clindamycin based on culture  results.  He followed up with podiatry recently and the wound appeared to be healing well.  He follows up with podiatry every 2 weeks for some time.  History of tobacco abuse: Patient quit smoking earlier this year.  He is smoking 2 packs/day.  He on average smoked 1 pack/day for the last 44 years.  He feels significantly better overall since quitting smoking.     ROS: See pertinent positives and negatives per HPI.  Past Medical History:  Diagnosis Date  . Arthritis   . Diabetes mellitus without complication ([place])   . History of kidney stones    h/o  . Hyperlipidemia   . Hypertension   . Neuropathy     Past Surgical History:  Procedure Laterality Date  . AMPUTATION TOE Left 08/10/2018   Procedure: RAY LEFT;  Surgeon: Samara Deist, DPM;  Location: [facility];  Service: Podiatry;  Laterality: Left;  . ARTHRODESIS METATARSAL Left 02/01/2019   Procedure: ARTHRODESIS METATARSAL;  Surgeon: Samara Deist, DPM;  Location: [facility];  Service: Podiatry;  Laterality: Left;  . FOOT ARTHRODESIS Left 02/01/2019   Procedure: ARTHRODESIS FOOT;STJ;  Surgeon: Samara Deist, DPM;  Location: [facility];  Service: Podiatry;  Laterality: Left;  . IRRIGATION AND DEBRIDEMENT FOOT Left 10/26/2018   Procedure: IRRIGATION AND DEBRIDEMENT FOOT, LEFT;  Surgeon: Samara Deist, DPM;  Location: [facility];  Service: Podiatry;  Laterality: Left;  . LOWER EXTREMITY ANGIOGRAPHY Right 02/25/2016   Procedure: Lower Extremity Angiography;  Surgeon: Algernon Huxley, MD;  Location: [place] [facility];  Service: Cardiovascular;  Laterality: Right;  . LOWER EXTREMITY ANGIOGRAPHY Right 02/27/2017   Procedure: LOWER EXTREMITY ANGIOGRAPHY;  Surgeon: Algernon Huxley, MD;  Location: [place] [facility];  Service: Cardiovascular;  Laterality: Right;  .  LOWER EXTREMITY ANGIOGRAPHY Left 08/06/2018   Procedure: LOWER EXTREMITY ANGIOGRAPHY;  Surgeon: Algernon Huxley, MD;  Location: [place] [facility];  Service: Cardiovascular;  Laterality: Left;    . LOWER EXTREMITY INTERVENTION  02/25/2016   Procedure: Lower Extremity Intervention;  Surgeon: Algernon Huxley, MD;  Location: [place] [facility];  Service: Cardiovascular;;  . TOE AMPUTATION      Family History  Problem Relation Age of Onset  . Diabetes Mother   . Heart disease Father   . Diabetes Sister   . Diabetes Brother   . Cancer Sister        lung  . Cancer Sister        breast    SOCIAL HX: Former smoker   Current Outpatient Medications:  .  amLODipine (NORVASC) 10 MG tablet, Take 1 tablet (10 mg total) by mouth daily., Disp: 90 tablet, Rfl: 3 .  aspirin EC 81 MG tablet, Take 1 tablet (81 mg total) by mouth daily., Disp: 150 tablet, Rfl: 2 .  atorvastatin (LIPITOR) 40 MG tablet, TAKE ONE (1) TABLET EACH DAY (Patient taking differently: Take 40 mg by mouth daily. ), Disp: 90 tablet, Rfl: 3 .  blood glucose meter kit and supplies KIT, E11.51, check 2x daily, dispense based on patient and insurance preference, Disp: 1 each, Rfl: 0 .  clindamycin (CLEOCIN) 300 MG capsule, Take 1 capsule (300 mg total) by mouth 4 (four) times daily., Disp: 40 capsule, Rfl: 0 .  clopidogrel (PLAVIX) 75 MG tablet, TAKE ONE (1) TABLET EACH DAY (Patient taking differently: Take 75 mg by mouth daily. ), Disp: 90 tablet, Rfl: 3 .  gabapentin (NEURONTIN) 300 MG capsule, TAKE 1 CAPSULE (300 MG TOTAL) BY MOUTH 4 (FOUR) TIMES DAILY., Disp: 360 capsule, Rfl: 2 .  glipiZIDE (GLUCOTROL XL) 10 MG 24 hr tablet, TAKE ONE (1) TABLET EACH DAY WITH BREAKFAST (Patient taking differently: Take 10 mg by mouth daily with breakfast. ), Disp: 90 tablet, Rfl: 3 .  glucose blood (ONE TOUCH ULTRA TEST) test strip, Check blood sugar twice daily Dx code: E11.9, Disp: 200 each, Rfl: 0 .  HYDROcodone-acetaminophen (NORCO) 5-325 MG tablet, Take 1 tablet by mouth every 4 (four) hours as needed for moderate pain., Disp: 30 tablet, Rfl: 0 .  JARDIANCE 25 MG TABS tablet, TAKE 1 TABLET BY MOUTH EVERY DAY (Patient taking differently:  Take 25 mg by mouth daily. ), Disp: 90 tablet, Rfl: 3 .  linagliptin (TRADJENTA) 5 MG TABS tablet, Take 1 tablet (5 mg total) by mouth daily., Disp: 90 tablet, Rfl: 1 .  lisinopril-hydrochlorothiazide (ZESTORETIC) 10-12.5 MG tablet, Take 1 tablet by mouth daily., Disp: 90 tablet, Rfl: 3 .  metFORMIN (GLUCOPHAGE) 1000 MG tablet, TAKE 1 TABLET BY MOUTH TWICE DAILY WITH A MEAL, Disp: 60 tablet, Rfl: 0 .  metoprolol succinate (TOPROL-XL) 50 MG 24 hr tablet, Take 1 tablet (50 mg total) by mouth daily. Take with or immediately following a meal., Disp: 90 tablet, Rfl: 3 .  traMADol (ULTRAM) 50 MG tablet, Take 1 tablet (50 mg total) by mouth 3 (three) times daily. Dx code M54.9, Disp: 90 tablet, Rfl: 0  EXAM:  VITALS per patient if applicable:  GENERAL: alert, oriented, appears well and in no acute distress  HEENT: atraumatic, conjunttiva clear, no obvious abnormalities on inspection of external nose and ears  NECK: normal movements of the head and neck  LUNGS: on inspection no signs of respiratory distress, breathing rate appears normal, no obvious gross SOB, gasping  or wheezing  CV: no obvious cyanosis  MS: moves all visible extremities without noticeable abnormality  PSYCH/NEURO: pleasant and cooperative, no obvious depression or anxiety, speech and thought processing grossly intact  ASSESSMENT AND PLAN:  Discussed the following assessment and plan:  Essential hypertension At goal.  Continue current regimen.  Type 2 diabetes mellitus without complication, without long-term current use of insulin ([place]) Fairly well controlled.  We will continue his current regimen.  Plan for lab work in 3 months at follow-up.  I encouraged him to see an ophthalmologist.  Charcot ankle, left Doing well status post surgery.  He has completed his antibiotics.  Discussed signs of infection and that he would need to contact his podiatrist or [place] regarding this.  History of tobacco abuse Congratulated on  smoking cessation.  Discussed lung cancer screening with CT scan.  He wants to wait on his foot to heal.  We can discuss this at a future visit.  Colon cancer screening Discussed colonoscopy.  The patient declined this.  He wondered about Cologuard.  He has no personal history of polyps, no family history of colon cancer, and no rectal bleeding.  I advised him to check with his insurance to make sure that they would cover Cologuard.  Discussed that if he had a positive Cologuard he would need to have a colonoscopy and that his insurance may not pay for the colonoscopy given that it would no longer be a screening test.  He will check with his insurance to see if they will cover a colonoscopy after a positive Cologuard and he will let us know how he would like to proceed once he speaks with his insurance company.    I discussed the assessment and treatment plan with the patient. The patient was provided an opportunity to ask questions and all were answered. The patient agreed with the plan and demonstrated an understanding of the instructions.   The patient was advised to call back or seek an in-person evaluation if the symptoms worsen or if the condition fails to improve as anticipated.   [name] Rumps, MD

## 2019-02-18 NOTE — Assessment & Plan Note (Signed)
At goal. Continue current regimen. 

## 2019-02-18 NOTE — Assessment & Plan Note (Signed)
Discussed colonoscopy.  The patient declined this.  He wondered about Cologuard.  He has no personal history of polyps, no family history of colon cancer, and no rectal bleeding.  I advised him to check with his insurance to make sure that they would cover Cologuard.  Discussed that if he had a positive Cologuard he would need to have a colonoscopy and that his insurance may not pay for the colonoscopy given that it would no longer be a screening test.  He will check with his insurance to see if they will cover a colonoscopy after a positive Cologuard and he will let us know how he would like to proceed once he speaks with his insurance company.

## 2019-02-18 NOTE — Assessment & Plan Note (Signed)
Fairly well controlled.  We will continue his current regimen.  Plan for lab work in 3 months at follow-up.  I encouraged him to see an ophthalmologist.

## 2019-02-26 DIAGNOSIS — S93315D Dislocation of tarsal joint of left foot, subsequent encounter: Secondary | ICD-10-CM | POA: Diagnosis not present

## 2019-03-10 ENCOUNTER — Other Ambulatory Visit: Payer: Self-pay | Admitting: Family Medicine

## 2019-03-12 ENCOUNTER — Other Ambulatory Visit: Payer: Self-pay | Admitting: Family Medicine

## 2019-03-12 DIAGNOSIS — G8929 Other chronic pain: Secondary | ICD-10-CM

## 2019-03-12 MED ORDER — TRAMADOL HCL 50 MG PO TABS: 50.0000 mg | ORAL_TABLET | Freq: Three times a day (TID) | ORAL | 0 refills | Status: DC

## 2019-03-14 ENCOUNTER — Telehealth: Payer: Self-pay | Admitting: Family Medicine

## 2019-03-14 NOTE — Telephone Encounter (Signed)
Pt only got a 30 day supply on his metFORMIN (GLUCOPHAGE) 1000 MG tablet, he normally gets a 90 day supply.

## 2019-03-15 MED ORDER — METFORMIN HCL 1000 MG PO TABS: ORAL_TABLET | ORAL | 1 refills | Status: DC

## 2019-03-15 NOTE — Telephone Encounter (Signed)
Patient was given a 30 day supply of metformin and he needed a 90 day supply, he already picked up the 30 day because he ran out I [facility] a [facility]

## 2019-04-02 DIAGNOSIS — S93315D Dislocation of tarsal joint of left foot, subsequent encounter: Secondary | ICD-10-CM | POA: Diagnosis not present

## 2019-04-16 ENCOUNTER — Other Ambulatory Visit: Payer: Self-pay | Admitting: Family Medicine

## 2019-04-16 DIAGNOSIS — G8929 Other chronic pain: Secondary | ICD-10-CM

## 2019-04-17 NOTE — Telephone Encounter (Signed)
Refill request for Tramadol, last seen 02-18-19, last filled 03-18-19.  Please advise.

## 2019-04-18 NOTE — Telephone Encounter (Signed)
Pt called to check on the status of refill 

## 2019-04-26 DIAGNOSIS — M14672 Charcot's joint, left ankle and foot: Secondary | ICD-10-CM | POA: Diagnosis not present

## 2019-05-14 ENCOUNTER — Other Ambulatory Visit: Payer: Self-pay | Admitting: Family Medicine

## 2019-05-14 DIAGNOSIS — G8929 Other chronic pain: Secondary | ICD-10-CM

## 2019-05-14 DIAGNOSIS — E119 Type 2 diabetes mellitus without complications: Secondary | ICD-10-CM | POA: Diagnosis not present

## 2019-05-14 DIAGNOSIS — H524 Presbyopia: Secondary | ICD-10-CM | POA: Diagnosis not present

## 2019-05-14 DIAGNOSIS — H52223 Regular astigmatism, bilateral: Secondary | ICD-10-CM | POA: Diagnosis not present

## 2019-05-14 LAB — HM DIABETES EYE EXAM

## 2019-05-16 DIAGNOSIS — Z89422 Acquired absence of other left toe(s): Secondary | ICD-10-CM | POA: Diagnosis not present

## 2019-05-16 DIAGNOSIS — E1140 Type 2 diabetes mellitus with diabetic neuropathy, unspecified: Secondary | ICD-10-CM | POA: Diagnosis not present

## 2019-05-16 DIAGNOSIS — B351 Tinea unguium: Secondary | ICD-10-CM | POA: Diagnosis not present

## 2019-05-16 DIAGNOSIS — Z794 Long term (current) use of insulin: Secondary | ICD-10-CM | POA: Diagnosis not present

## 2019-05-16 DIAGNOSIS — S93315D Dislocation of tarsal joint of left foot, subsequent encounter: Secondary | ICD-10-CM | POA: Diagnosis not present

## 2019-05-16 NOTE — Telephone Encounter (Signed)
Last filled 04-18-19 #90 Last OV 02-18-19 Next OV 05-22-19 [facility]

## 2019-05-20 ENCOUNTER — Other Ambulatory Visit: Payer: Self-pay

## 2019-05-22 ENCOUNTER — Ambulatory Visit: Payer: Medicare Other | Admitting: Family Medicine

## 2019-06-05 ENCOUNTER — Other Ambulatory Visit: Payer: Self-pay

## 2019-06-05 ENCOUNTER — Encounter: Payer: Self-pay | Admitting: Family Medicine

## 2019-06-05 ENCOUNTER — Ambulatory Visit (INDEPENDENT_AMBULATORY_CARE_PROVIDER_SITE_OTHER): Payer: Medicare Other | Admitting: Family Medicine

## 2019-06-05 DIAGNOSIS — S40862A Insect bite (nonvenomous) of left upper arm, initial encounter: Secondary | ICD-10-CM | POA: Diagnosis not present

## 2019-06-05 DIAGNOSIS — W57XXXA Bitten or stung by nonvenomous insect and other nonvenomous arthropods, initial encounter: Secondary | ICD-10-CM | POA: Diagnosis not present

## 2019-06-05 DIAGNOSIS — Z89432 Acquired absence of left foot: Secondary | ICD-10-CM | POA: Insufficient documentation

## 2019-06-05 DIAGNOSIS — E119 Type 2 diabetes mellitus without complications: Secondary | ICD-10-CM

## 2019-06-05 DIAGNOSIS — I10 Essential (primary) hypertension: Secondary | ICD-10-CM

## 2019-06-05 DIAGNOSIS — S40869A Insect bite (nonvenomous) of unspecified upper arm, initial encounter: Secondary | ICD-10-CM | POA: Insufficient documentation

## 2019-06-05 LAB — LIPID PANEL
Cholesterol: 121 mg/dL (ref 0–200)
HDL: 30.8 mg/dL — ABNORMAL LOW (ref >39.00)
LDL Cholesterol: 65 mg/dL (ref 0–99)
NonHDL: 90.35
Total CHOL/HDL Ratio: 4
Triglycerides: 125 mg/dL (ref 0.0–149.0)
VLDL: 25 mg/dL (ref 0.0–40.0)

## 2019-06-05 LAB — COMPREHENSIVE METABOLIC PANEL
ALT: 12 U/L (ref 0–53)
AST: 9 U/L (ref 0–37)
Albumin: 3.9 g/dL (ref 3.5–5.2)
Alkaline Phosphatase: 115 U/L (ref 39–117)
BUN: 11 mg/dL (ref 6–23)
CO2: 28 mEq/L (ref 19–32)
Calcium: 8.9 mg/dL (ref 8.4–10.5)
Chloride: 96 mEq/L (ref 96–112)
Creatinine, Ser: 0.89 mg/dL (ref 0.40–1.50)
GFR: 87.1 mL/min (ref >60.00)
Glucose, Bld: 163 mg/dL — ABNORMAL HIGH (ref 70–99)
Potassium: 3.8 mEq/L (ref 3.5–5.1)
Sodium: 132 mEq/L — ABNORMAL LOW (ref 135–145)
Total Bilirubin: 0.7 mg/dL (ref 0.2–1.2)
Total Protein: 6.6 g/dL (ref 6.0–8.3)

## 2019-06-05 LAB — HEMOGLOBIN A1C: Hgb A1c MFr Bld: 8.3 % — ABNORMAL HIGH (ref 4.6–6.5)

## 2019-06-05 NOTE — Assessment & Plan Note (Signed)
Doing well.  He will continue to see his specialist.

## 2019-06-05 NOTE — Assessment & Plan Note (Signed)
Undetermined control.  Check labs.  Continue current medication.

## 2019-06-05 NOTE — Assessment & Plan Note (Signed)
Progressively improving.  He will monitor.  If it does not resolve he will let us know.

## 2019-06-05 NOTE — Progress Notes (Signed)
[name] Rumps, MD Phone: [phone]  Joseph Singh is a 59 y.o. male who presents today for f/u.  HYPERTENSION  Disease Monitoring  Home BP Monitoring similar to today Chest pain- no    Dyspnea- no Medications  Compliance-  Taking lisinopril, HCTZ, amlodipine.  Edema- no  DIABETES Disease Monitoring: Blood Sugar ranges-not checking Polyuria/phagia/dipsia- no      Optho- saw 2 weeks ago Medications: Compliance- taking jardiance, tradjenta, metformin, glipizide Hypoglycemic symptoms- no  Status post partial foot amputation: Doing well.  Podiatry released him for 68-monthfollow-up.  No significant pain.  He is wearing the boot to prevent further complications.  He reports a bug bite on his left volar forearm several days ago.  He had a large purplish area afterwards though it has progressively improved.   Social History   Tobacco Use  Smoking Status Former Smoker  . Packs/day: 0.50  . Years: 35.00  . Pack years: 17.50  . Types: Cigarettes  . Quit date: 08/09/2018  . Years since quitting: 0.8  Smokeless Tobacco Never Used     ROS see history of present illness  Objective  Physical Exam Vitals:   06/05/19 1143  BP: 118/70  Pulse: 74  Temp: (!) 97.3 F (36.3 C)  SpO2: 97%    BP Readings from Last 3 Encounters:  06/05/19 118/70  02/02/19 121/63  01/29/19 140/62   Wt Readings from Last 3 Encounters:  06/05/19 (!) 309 lb 9.6 oz (140.4 kg)  02/18/19 300 lb (136.1 kg)  02/01/19 300 lb (136.1 kg)    Physical Exam Constitutional:      General: He is not in acute distress.    Appearance: He is not diaphoretic.  Cardiovascular:     Rate and Rhythm: Normal rate and regular rhythm.     Heart sounds: Normal heart sounds.  Pulmonary:     Effort: Pulmonary effort is normal.     Breath sounds: Normal breath sounds.  Skin:    General: Skin is warm and dry.       Neurological:     Mental Status: He is alert.    Diabetic Foot Exam - Simple   Simple Foot  Form Diabetic Foot exam was performed with the following findings: Yes 06/05/2019 11:49 AM  Visual Inspection See comments: Yes Sensation Testing See comments: Yes Pulse Check See comments: Yes Comments Status post lateral foot amputation in the left foot, thickened toenails bilaterally, scaly skin, absent sensation to light touch and monofilament testing, intact PT pulse in the right foot, intact DP pulses bilaterally, left PT pulse difficult to palpate given surgical changes      Assessment/Plan: Please see individual problem list.  Essential hypertension Well-controlled.  Continue current regimen.  Check labs.  Type 2 diabetes mellitus without complication, without long-term current use of insulin [name] Undetermined control.  Check labs.  Continue current medication.  Partial nontraumatic amputation of foot, left [name] Doing well.  He will continue to see his specialist.  Insect bite of arm Progressively improving.  He will monitor.  If it does not resolve he will let [place].   Health Maintenance: He is still considering whether or not he will do a colonoscopy.  He will let [place] when he figures this out.  Orders Placed This Encounter  Procedures  . Lipid panel  . Comp Met (CMET)  . HgB A1c    No orders of the defined types were placed in this encounter.   This visit occurred during the  SARS-CoV-2 public health emergency.  Safety protocols were in place, including screening questions prior to the visit, additional usage of staff PPE, and extensive cleaning of exam room while observing appropriate contact time as indicated for disinfecting solutions.    Eric Sonnenberg, MD [facility] [place]  

## 2019-06-05 NOTE — Assessment & Plan Note (Signed)
Well-controlled.  Continue current regimen.  Check labs. 

## 2019-06-05 NOTE — Patient Instructions (Signed)
Nice to see you. We will check labs today and contact you with results.

## 2019-06-13 ENCOUNTER — Other Ambulatory Visit: Payer: Self-pay | Admitting: Family Medicine

## 2019-06-13 DIAGNOSIS — G8929 Other chronic pain: Secondary | ICD-10-CM

## 2019-06-13 DIAGNOSIS — M5442 Lumbago with sciatica, left side: Secondary | ICD-10-CM

## 2019-06-14 NOTE — Telephone Encounter (Signed)
Pt called to check on refill.

## 2019-06-18 ENCOUNTER — Ambulatory Visit (INDEPENDENT_AMBULATORY_CARE_PROVIDER_SITE_OTHER): Payer: Medicare Other

## 2019-06-18 VITALS — Ht 76.0 in | Wt 309.0 lb

## 2019-06-18 DIAGNOSIS — Z0000 Encounter for general adult medical examination without abnormal findings: Secondary | ICD-10-CM

## 2019-06-18 NOTE — Progress Notes (Signed)
Subjective:   Joseph Singh is a 59 y.o. male who presents for Medicare Annual/Subsequent preventive examination.  Review of Systems:  No ROS.  Medicare Wellness Virtual Visit.  Visual/audio telehealth visit, UTA vital signs.   Ht/Wt provided. See social history for additional risk factors.   Cardiac Risk Factors include: advanced age (>68mn, >>80women);male gender;hypertension;diabetes mellitus     Objective:    Vitals: Ht 6' 4" (1.93 m)   Wt (!) 309 lb (140.2 kg)   BMI 37.61 kg/m   Body mass index is 37.61 kg/m.  Advanced Directives 06/18/2019 02/01/2019 01/29/2019 10/25/2018 10/25/2018 08/08/2018 01/29/2018  Does Patient Have a Medical Advance Directive? No No No - No No No  Does patient want to make changes to medical advance directive? - - - - - - Yes (MAU/Ambulatory/Procedural Areas - Information given)  Would patient like information on creating a medical advance directive? No - Patient declined No - Patient declined No - Patient declined No - Patient declined - - -    Tobacco Social History   Tobacco Use  Smoking Status Former Smoker  . Packs/day: 0.50  . Years: 35.00  . Pack years: 17.50  . Types: Cigarettes  . Quit date: 08/09/2018  . Years since quitting: 0.8  Smokeless Tobacco Never Used     Counseling given: Not Answered   Clinical Intake:  Pre-visit preparation completed: Yes        Diabetes: Yes(Followed by pcp)  How often do you need to have someone help you when you read instructions, pamphlets, or other written materials from your doctor or pharmacy?: 1 - Never  Interpreter Needed?: No     Past Medical History:  Diagnosis Date  . Arthritis   . Diabetes mellitus [place] complication [place]   . History of kidney stones    h/o  . Hyperlipidemia   . Hypertension   . Neuropathy    Past Surgical History:  Procedure Laterality Date  . AMPUTATION TOE Left 08/10/2018   Procedure: RAY LEFT;  Surgeon: FSamara Deist DPM;  Location: [facility];   Service: Podiatry;  Laterality: Left;  . ARTHRODESIS METATARSAL Left 02/01/2019   Procedure: ARTHRODESIS METATARSAL;  Surgeon: FSamara Deist DPM;  Location: [facility];  Service: Podiatry;  Laterality: Left;  . FOOT ARTHRODESIS Left 02/01/2019   Procedure: ARTHRODESIS FOOT;STJ;  Surgeon: FSamara Deist DPM;  Location: [facility];  Service: Podiatry;  Laterality: Left;  . IRRIGATION AND DEBRIDEMENT FOOT Left 10/26/2018   Procedure: IRRIGATION AND DEBRIDEMENT FOOT, LEFT;  Surgeon: FSamara Deist DPM;  Location: [facility];  Service: Podiatry;  Laterality: Left;  . LOWER EXTREMITY ANGIOGRAPHY Right 02/25/2016   Procedure: Lower Extremity Angiography;  Surgeon: JAlgernon Huxley MD;  [place] [facility];  Service: Cardiovascular;  Laterality: Right;  . LOWER EXTREMITY ANGIOGRAPHY Right 02/27/2017   Procedure: LOWER EXTREMITY ANGIOGRAPHY;  Surgeon: DAlgernon Huxley MD;  [place] [facility];  Service: Cardiovascular;  Laterality: Right;  . LOWER EXTREMITY ANGIOGRAPHY Left 08/06/2018   Procedure: LOWER EXTREMITY ANGIOGRAPHY;  Surgeon: DAlgernon Huxley MD;  [facility];  Service: Cardiovascular;  Laterality: Left;  . LOWER EXTREMITY INTERVENTION  02/25/2016   Procedure: Lower Extremity Intervention;  Surgeon: JAlgernon Huxley MD;  [facility];  Service: Cardiovascular;;  . TOE AMPUTATION     Family History  Problem Relation Age of Onset  . Diabetes Mother   . Heart disease Father   . Diabetes Sister   .  Diabetes Brother   . Cancer Sister        lung  . Cancer Sister        breast   Social History   Socioeconomic History  . Marital status: Married    Spouse name: Not on file  . Number of children: Not on file  . Years of education: Not on file  . Highest education level: Not on file  Occupational History  . Not on file  Tobacco Use  . Smoking status: Former Smoker    Packs/day: 0.50    Years: 35.00    Pack years: 17.50    Types: Cigarettes    Quit  date: 08/09/2018    Years since quitting: 0.8  . Smokeless tobacco: Never Used  Substance and Sexual Activity  . Alcohol use: No    Alcohol/week: 0.0 standard drinks  . Drug use: No  . Sexual activity: Yes    Partners: Female    Comment: Wife  Other Topics Concern  . Not on file  Social History Narrative   Permanently disabled   [place] on the job and hurt his shoulder    Lives with wife and 2 children    1 grandchild from a previous marriage    Pets: Not inside    GED    Right handed    Caffeine- 2-3 sodas, tea, no coffee    Enjoys watching tv and being outside    Social Determinants of Health   Financial Resource Strain:   . Difficulty of Paying Living Expenses:   Food Insecurity:   . Worried About Charity fundraiser in the Last Year:   . Arboriculturist in the Last Year:   Transportation Needs:   . Film/video editor (Medical):   Marland Kitchen Lack of Transportation (Non-Medical):   Physical Activity:   . Days of Exercise per Week:   . Minutes of Exercise per Session:   Stress:   . Feeling of Stress :   Social Connections:   . Frequency of Communication with Friends and Family:   . Frequency of Social Gatherings with Friends and Family:   . Attends Religious Services:   . Active Member of Clubs or Organizations:   . Attends Archivist Meetings:   Marland Kitchen Marital Status:     Outpatient Encounter Medications as of 06/18/2019  Medication Sig  . amLODipine (NORVASC) 10 MG tablet Take 1 tablet (10 mg total) by mouth daily.  Marland Kitchen aspirin EC 81 MG tablet Take 1 tablet (81 mg total) by mouth daily.  Marland Kitchen atorvastatin (LIPITOR) 40 MG tablet TAKE ONE (1) TABLET EACH DAY (Patient taking differently: Take 40 mg by mouth daily. )  . blood glucose meter kit and supplies KIT E11.51, check 2x daily, dispense based on patient and insurance preference  . clindamycin (CLEOCIN) 300 MG capsule Take 1 capsule (300 mg total) by mouth 4 (four) times daily.  . clopidogrel (PLAVIX) 75 MG tablet TAKE  ONE (1) TABLET EACH DAY (Patient taking differently: Take 75 mg by mouth daily. )  . gabapentin (NEURONTIN) 300 MG capsule TAKE 1 CAPSULE (300 MG TOTAL) BY MOUTH 4 (FOUR) TIMES DAILY.  Marland Kitchen glipiZIDE (GLUCOTROL XL) 10 MG 24 hr tablet TAKE ONE (1) TABLET EACH DAY WITH BREAKFAST (Patient taking differently: Take 10 mg by mouth daily with breakfast. )  . glucose blood (ONE TOUCH ULTRA TEST) test strip Check blood sugar twice daily Dx code: E11.9  . HYDROcodone-acetaminophen (NORCO) 5-325 MG tablet Take  1 tablet by mouth every 4 (four) hours as needed for moderate pain.  Marland Kitchen JARDIANCE 25 MG TABS tablet TAKE 1 TABLET BY MOUTH EVERY DAY (Patient taking differently: Take 25 mg by mouth daily. )  . linagliptin (TRADJENTA) 5 MG TABS tablet Take 1 tablet (5 mg total) by mouth daily.  Marland Kitchen lisinopril-hydrochlorothiazide (ZESTORETIC) 10-12.5 MG tablet Take 1 tablet by mouth daily.  . metFORMIN (GLUCOPHAGE) 1000 MG tablet TAKE 1 TABLET BY MOUTH TWICE DAILY WITH A MEAL  . metoprolol succinate (TOPROL-XL) 50 MG 24 hr tablet Take 1 tablet (50 mg total) by mouth daily. Take with or immediately following a meal.  . traMADol (ULTRAM) 50 MG tablet TAKE ONE TABLET 3 TIMES DAILY   No facility-administered encounter medications on file as of 06/18/2019.    Activities of Daily Living In your present state of health, do you have any difficulty performing the following activities: 06/18/2019 02/01/2019  Hearing? N N  Vision? N N  Difficulty concentrating or making decisions? N N  Walking or climbing stairs? Y Y  Dressing or bathing? N N  Doing errands, shopping? N N  Preparing Food and eating ? N -  Using the Toilet? N -  In the past six months, have you accidently leaked urine? N -  Do you have problems with loss of bowel control? N -  Managing your Medications? N -  Managing your Finances? N -  Housekeeping or managing your Housekeeping? N -  Some recent data might be hidden    Patient Care Team: Leone Haven,  MD as PCP - General (Family Medicine)   Assessment:   This is a routine wellness examination for [facility].  Nurse connected with patient 06/18/19 at  9:00 AM EDT by a telephone enabled telemedicine application and verified that I am speaking with the correct person using two identifiers. Patient stated full name and DOB. Patient gave permission to continue with virtual visit. Patient's location was at home and Nurse's location was at [place] office.   Patient is alert and oriented x3. Patient denies difficulty focusing or concentrating.  Health Maintenance Due: -Colonoscopy- deferred per patient request  -Covid vaccine- deferred per patient request -Hgb A1c- 06/05/19 (8.3) See completed HM at the end of note.   Eye: Visual acuity not assessed. Virtual visit. Followed by their ophthalmologist. Retinopathy- none reported.  Dental: UTD    Hearing: Demonstrates normal hearing during visit.  Safety:  Patient feels safe at home- yes Patient does have smoke detectors at home- yes Patient does wear sunscreen or protective clothing when in direct sunlight - yes Patient does wear seat belt when in a moving vehicle - yes Patient drives- yes Adequate lighting in walkways free from debris- yes Grab bars and handrails used as appropriate- yes Ambulates with an assistive device- no Cell phone on person when ambulating outside of the home- yes  Social: Alcohol intake - no     Smoking history- former   Smokers in home? none Illicit drug use? none  Medication: Taking as directed and without issues.  Pill box in use -yes  Self managed - yes   Covid-19: Precautions and sickness symptoms discussed. Wears mask, social distancing, hand hygiene as appropriate.   Activities of Daily Living Patient denies needing assistance with: household chores, feeding themselves, getting from bed to chair, getting to the toilet, bathing/showering, dressing, managing money, or preparing meals.   Discussed  the importance of a healthy diet, water intake and the benefits of aerobic exercise.  Physical activity- active around the home, no routine.   Diet:  Regular; low carb/low cholesterol encouraged Water: good intake, 64 ounces Caffeine: 4-5 bottles of pepsi, 64-80 ounces  Other Providers Patient Care Team: Leone Haven, MD as PCP - General (Family Medicine)  Exercise Activities and Dietary recommendations Current Exercise Habits: The patient does not participate in regular exercise at present, Intensity: Mild  Goals      Patient Stated   . DIET - INCREASE WATER INTAKE (pt-stated)     Less soda       Fall Risk Fall Risk  06/18/2019 06/05/2019 01/29/2018 01/19/2017 06/13/2016  Falls in the past year? 0 0 0 No No  Number falls in past yr: - 0 - - -  Follow up Falls evaluation completed Falls evaluation completed - - -   Timed Get Up and Go Performed: no, virtual visit  Depression Screen PHQ 2/9 Scores 06/18/2019 06/05/2019 02/23/2018 01/29/2018  PHQ - 2 Score 0 0 0 0  PHQ- 9 Score - - - -    Cognitive Function MMSE - Mini Mental State Exam 01/19/2017  Orientation to time 5  Orientation to Place 5  Registration 3  Attention/ Calculation 5  Recall 3  Language- name 2 objects 2  Language- repeat 1  Language- follow 3 step command 3  Language- read & follow direction 1  Write a sentence 1  Copy design 1  Total score 30     6CIT Screen 06/18/2019 01/29/2018  What Year? 0 points 0 points  What month? 0 points 0 points  What time? 0 points 0 points  Count back from 20 0 points 0 points  Months in reverse 0 points 0 points    Immunization History  Administered Date(s) Administered  . Influenza,inj,Quad PF,6+ Mos 12/11/2017, 10/22/2018   Screening Tests Health Maintenance  Topic Date Due  . COVID-19 Vaccine (1) Never done  . COLONOSCOPY  02/13/2019  . INFLUENZA VACCINE  09/22/2019  . HEMOGLOBIN A1C  12/05/2019  . OPHTHALMOLOGY EXAM  05/13/2020  . FOOT EXAM   06/04/2020  . TETANUS/TDAP  02/23/2025  . PNEUMOCOCCAL POLYSACCHARIDE VACCINE AGE 31-64 HIGH RISK  Completed  . Hepatitis C Screening  Completed  . HIV Screening  Completed       Plan:   Keep all routine maintenance appointments.   Next scheduled lab 06/19/19 @ 10:00  Follow up 09/03/19 @ 8:30  Medicare Attestation I have personally reviewed: The patient's medical and social history Their use of alcohol, tobacco or illicit drugs Their current medications and supplements The patient's functional ability including ADLs,fall risks, home safety risks, cognitive, and hearing and visual impairment Diet and physical activities Evidence for depression   I have reviewed and discussed with patient certain preventive protocols, quality metrics, and best practice recommendations.      Varney Biles, LPN  [id number]

## 2019-06-18 NOTE — Progress Notes (Signed)
I have reviewed the above note and agree.  Kejon Feild, M.D.  

## 2019-06-18 NOTE — Patient Instructions (Addendum)
  Joseph Singh , Thank you for taking time to come for your Medicare Wellness Visit. I appreciate your ongoing commitment to your health goals. Please review the following plan we discussed and let me know if I can assist you in the future.   These are the goals we discussed: Goals      Patient Stated   . DIET - INCREASE WATER INTAKE (pt-stated)     Less soda       This is a list of the screening recommended for you and due dates:  Health Maintenance  Topic Date Due  . COVID-19 Vaccine (1) Never done  . Colon Cancer Screening  02/13/2019  . Flu Shot  09/22/2019  . Hemoglobin A1C  12/05/2019  . Eye exam for diabetics  05/13/2020  . Complete foot exam   06/04/2020  . Tetanus Vaccine  02/23/2025  . Pneumococcal vaccine  Completed  .  Hepatitis C: One time screening is recommended by Center for Disease Control  (CDC) for  adults born from [birth year] through 1967.   Completed  . HIV Screening  Completed

## 2019-06-19 ENCOUNTER — Other Ambulatory Visit: Payer: Self-pay

## 2019-06-19 ENCOUNTER — Other Ambulatory Visit (INDEPENDENT_AMBULATORY_CARE_PROVIDER_SITE_OTHER): Payer: Medicare Other

## 2019-06-19 DIAGNOSIS — E1165 Type 2 diabetes mellitus with hyperglycemia: Secondary | ICD-10-CM

## 2019-06-19 DIAGNOSIS — IMO0002 Reserved for concepts with insufficient information to code with codable children: Secondary | ICD-10-CM

## 2019-06-19 DIAGNOSIS — E1151 Type 2 diabetes mellitus with diabetic peripheral angiopathy without gangrene: Secondary | ICD-10-CM | POA: Diagnosis not present

## 2019-06-19 LAB — BASIC METABOLIC PANEL
BUN: 15 mg/dL (ref 6–23)
CO2: 28 mEq/L (ref 19–32)
Calcium: 9.2 mg/dL (ref 8.4–10.5)
Chloride: 97 mEq/L (ref 96–112)
Creatinine, Ser: 0.83 mg/dL (ref 0.40–1.50)
GFR: 94.4 mL/min (ref >60.00)
Glucose, Bld: 104 mg/dL — ABNORMAL HIGH (ref 70–99)
Potassium: 4.2 mEq/L (ref 3.5–5.1)
Sodium: 134 mEq/L — ABNORMAL LOW (ref 135–145)

## 2019-06-20 ENCOUNTER — Other Ambulatory Visit: Payer: Self-pay | Admitting: Family Medicine

## 2019-06-20 MED ORDER — OZEMPIC (0.25 OR 0.5 MG/DOSE) 2 MG/1.5ML ~~LOC~~ SOPN: PEN_INJECTOR | SUBCUTANEOUS | 1 refills | Status: DC

## 2019-06-24 DIAGNOSIS — E1140 Type 2 diabetes mellitus with diabetic neuropathy, unspecified: Secondary | ICD-10-CM | POA: Diagnosis not present

## 2019-07-08 ENCOUNTER — Other Ambulatory Visit: Payer: Self-pay | Admitting: Family Medicine

## 2019-07-16 ENCOUNTER — Other Ambulatory Visit: Payer: Self-pay | Admitting: Family Medicine

## 2019-07-16 DIAGNOSIS — M5442 Lumbago with sciatica, left side: Secondary | ICD-10-CM

## 2019-07-16 NOTE — Telephone Encounter (Signed)
Refill request for tramadol, last seen 06-05-19, last filled 06-14-19.  Please advise.

## 2019-07-25 ENCOUNTER — Telehealth: Payer: Self-pay

## 2019-07-25 NOTE — Telephone Encounter (Signed)
I called to [facility] and explained to the person on the phone that the provider does not do diabetic shoes so the form goes to the podiatrist first then it comes to the primary care physician to sign off, she stated she would find out who the podiatrist is and send the form to them.  Mavrik Bynum,cma

## 2019-07-25 NOTE — Telephone Encounter (Signed)
Can you call the medical supply store and see if his podiatrist is involved with ordering these shoes? I do not typically order diabetic shoes and leave that up to the podiatrist and then I sign off on them. I do not know what type of shoes he requires and thus his podiatrist will need to be involved if not already involved.

## 2019-08-07 ENCOUNTER — Other Ambulatory Visit: Payer: Self-pay

## 2019-08-07 DIAGNOSIS — I10 Essential (primary) hypertension: Secondary | ICD-10-CM

## 2019-08-07 MED ORDER — METOPROLOL SUCCINATE ER 50 MG PO TB24: 50.0000 mg | ORAL_TABLET | Freq: Every day | ORAL | 1 refills | Status: DC

## 2019-08-15 ENCOUNTER — Other Ambulatory Visit: Payer: Self-pay | Admitting: Family Medicine

## 2019-08-15 DIAGNOSIS — M14672 Charcot's joint, left ankle and foot: Secondary | ICD-10-CM | POA: Diagnosis not present

## 2019-08-15 DIAGNOSIS — B351 Tinea unguium: Secondary | ICD-10-CM | POA: Diagnosis not present

## 2019-08-15 DIAGNOSIS — Z794 Long term (current) use of insulin: Secondary | ICD-10-CM | POA: Diagnosis not present

## 2019-08-15 DIAGNOSIS — G8929 Other chronic pain: Secondary | ICD-10-CM

## 2019-08-15 DIAGNOSIS — Z89422 Acquired absence of other left toe(s): Secondary | ICD-10-CM | POA: Diagnosis not present

## 2019-08-15 DIAGNOSIS — E1140 Type 2 diabetes mellitus with diabetic neuropathy, unspecified: Secondary | ICD-10-CM | POA: Diagnosis not present

## 2019-08-15 NOTE — Telephone Encounter (Signed)
Refill request for tramadol, last seen 06-05-19, last filled 07-17-19.  Please advise.

## 2019-08-30 ENCOUNTER — Other Ambulatory Visit: Payer: Self-pay | Admitting: Family Medicine

## 2019-08-30 DIAGNOSIS — G629 Polyneuropathy, unspecified: Secondary | ICD-10-CM

## 2019-09-03 ENCOUNTER — Encounter: Payer: Self-pay | Admitting: Family Medicine

## 2019-09-03 ENCOUNTER — Other Ambulatory Visit: Payer: Self-pay

## 2019-09-03 ENCOUNTER — Ambulatory Visit (INDEPENDENT_AMBULATORY_CARE_PROVIDER_SITE_OTHER): Payer: Medicare Other | Admitting: Family Medicine

## 2019-09-03 DIAGNOSIS — I10 Essential (primary) hypertension: Secondary | ICD-10-CM

## 2019-09-03 DIAGNOSIS — M5442 Lumbago with sciatica, left side: Secondary | ICD-10-CM | POA: Diagnosis not present

## 2019-09-03 DIAGNOSIS — E1142 Type 2 diabetes mellitus with diabetic polyneuropathy: Secondary | ICD-10-CM

## 2019-09-03 DIAGNOSIS — E119 Type 2 diabetes mellitus without complications: Secondary | ICD-10-CM

## 2019-09-03 DIAGNOSIS — L821 Other seborrheic keratosis: Secondary | ICD-10-CM | POA: Diagnosis not present

## 2019-09-03 DIAGNOSIS — G8929 Other chronic pain: Secondary | ICD-10-CM

## 2019-09-03 LAB — POCT GLYCOSYLATED HEMOGLOBIN (HGB A1C): Hemoglobin A1C: 7.3 % — AB (ref 4.0–5.6)

## 2019-09-03 MED ORDER — OZEMPIC (1 MG/DOSE) 2 MG/1.5ML ~~LOC~~ SOPN: 1.0000 mg | PEN_INJECTOR | SUBCUTANEOUS | 1 refills | Status: DC

## 2019-09-03 MED ORDER — TRAMADOL HCL 50 MG PO TABS: 50.0000 mg | ORAL_TABLET | Freq: Three times a day (TID) | ORAL | 0 refills | Status: DC

## 2019-09-03 NOTE — Patient Instructions (Signed)
Nice to see you. Your A1c has improved some though is still not quite at goal.  We will increase your Ozempic.

## 2019-09-03 NOTE — Assessment & Plan Note (Signed)
Well-controlled.  Continue current regimen. 

## 2019-09-03 NOTE — Assessment & Plan Note (Addendum)
A1c improved though still slightly above goal.  We will increase his Ozempic as outlined below.  He will continue his other medications.

## 2019-09-03 NOTE — Assessment & Plan Note (Signed)
Stable.  Tramadol is beneficial.  We will send a refill and to be filled early given that he is going out of town.

## 2019-09-03 NOTE — Assessment & Plan Note (Signed)
Discussed the benign nature of this lesion.  Discussed the option of seeing dermatology for removal though I advised that this was not absolutely necessary if he does not want to see them.  He opted to monitor.  He will let us know if there are any changes or irritation.

## 2019-09-03 NOTE — Progress Notes (Signed)
Joseph Alar, MD Phone: [phone]  CHRSITOPHER Singh is a 59 y.o. male who presents today for f/u.  HYPERTENSION  Disease Monitoring  Home BP Monitoring similar to today or lower. Chest pain- no    Dyspnea- no Medications  Compliance-  Taking metoprolol, lisinopril, hctz, amlodipine  Edema- no changes to chronic edema  DIABETES Disease Monitoring: Blood Sugar ranges-not checking Polyuria/phagia/dipsia- no      Optho- UTD Medications: Compliance- taking glipizide, jardiance, metformin, ozempic Hypoglycemic symptoms- no  Neuropathy: Patient notes this is adequately controlled on his tramadol.  He does note he is going out of town next Monday and will be out of town when his tramadol is due for refill.  Seborrheic keratosis: Patient notes a spot on his right chest that appears consistent with a seborrheic keratosis.  He notes is been there about a year and has not changed in size.  Occasionally it itches.  He notes it did bleed some after he rubbed it with a washcloth will take shower.  Has gotten smaller since then.     Social History   Tobacco Use  Smoking Status Former Smoker  . Packs/day: 0.50  . Years: 35.00  . Pack years: 17.50  . Types: Cigarettes  . Quit date: 08/09/2018  . Years since quitting: 1.0  Smokeless Tobacco Never Used     ROS see history of present illness  Objective  Physical Exam Vitals:   09/03/19 0833  BP: 130/70  Pulse: 65  Temp: 98.2 F (36.8 C)  SpO2: 99%    BP Readings from Last 3 Encounters:  09/03/19 130/70  06/05/19 118/70  02/02/19 121/63   Wt Readings from Last 3 Encounters:  09/03/19 (!) 308 lb 6.4 oz (139.9 kg)  06/18/19 (!) 309 lb (140.2 kg)  06/05/19 (!) 309 lb 9.6 oz (140.4 kg)    Physical Exam Constitutional:      General: He is not in acute distress.    Appearance: He is not diaphoretic.  Cardiovascular:     Rate and Rhythm: Normal rate and regular rhythm.     Heart sounds: Normal heart sounds.  Pulmonary:      Effort: Pulmonary effort is normal.     Breath sounds: Normal breath sounds.  Skin:    General: Skin is warm and dry.       Neurological:     Mental Status: He is alert.      Assessment/Plan: Please see individual problem list.  Essential hypertension Well-controlled.  Continue current regimen.  Type 2 diabetes mellitus without complication, without long-term current use of insulin [name] A1c improved though still slightly above goal.  We will increase his Ozempic as outlined below.  He will continue his other medications.  Seborrheic keratosis Discussed the benign nature of this lesion.  Discussed the option of seeing dermatology for removal though I advised that this was not absolutely necessary if he does not want to see them.  He opted to monitor.  He will let us know if there are any changes or irritation.  Diabetic neuropathy [name] Stable.  Tramadol is beneficial.  We will send a refill and to be filled early given that he is going out of town.   Health Maintenance: Patient declines Covid vaccine.  He notes he is still thinking about having a colonoscopy done.  I encouraged him to have this completed as soon as he is able to.  Orders Placed This Encounter  Procedures  . POCT HgB A1C    Meds  ordered this encounter  Medications  . traMADol (ULTRAM) 50 MG tablet    Sig: Take 1 tablet (50 mg total) by mouth 3 (three) times daily.    Dispense:  90 tablet    Refill:  0  . Semaglutide, 1 MG/DOSE, (OZEMPIC, 1 MG/DOSE,) 2 MG/1.5ML SOPN    Sig: Inject 0.75 mLs (1 mg total) into the skin once a week.    Dispense:  6 pen    Refill:  1    This visit occurred during the SARS-CoV-2 public health emergency.  Safety protocols were in place, including screening questions prior to the visit, additional usage of staff PPE, and extensive cleaning of exam room while observing appropriate contact time as indicated for disinfecting solutions.    Joseph Alar, MD [facility] Primary  Care [facility]

## 2019-09-06 ENCOUNTER — Telehealth: Payer: Self-pay | Admitting: Family Medicine

## 2019-09-06 NOTE — Telephone Encounter (Signed)
Pt is going out of town and his refill is not due until 7-22  He is leaving Saturday and needs 9 more pills to make it until he gets back from vacation.

## 2019-09-06 NOTE — Telephone Encounter (Signed)
Sorry  The medication is traMADol (ULTRAM) 50 MG tablet for pt

## 2019-09-06 NOTE — Telephone Encounter (Signed)
I sent a refill in earlier this week for him to fill today. Please contact the patient or the pharmacy to see if he can just get that refill. Thanks.

## 2019-09-21 ENCOUNTER — Other Ambulatory Visit: Payer: Self-pay | Admitting: Family Medicine

## 2019-09-25 ENCOUNTER — Other Ambulatory Visit: Payer: Self-pay

## 2019-09-25 DIAGNOSIS — I10 Essential (primary) hypertension: Secondary | ICD-10-CM

## 2019-09-25 MED ORDER — LISINOPRIL-HYDROCHLOROTHIAZIDE 10-12.5 MG PO TABS: 1.0000 | ORAL_TABLET | Freq: Every day | ORAL | 3 refills | Status: DC

## 2019-09-25 MED ORDER — AMLODIPINE BESYLATE 10 MG PO TABS: 10.0000 mg | ORAL_TABLET | Freq: Every day | ORAL | 3 refills | Status: DC

## 2019-10-12 ENCOUNTER — Other Ambulatory Visit: Payer: Self-pay | Admitting: Family Medicine

## 2019-10-12 DIAGNOSIS — M5442 Lumbago with sciatica, left side: Secondary | ICD-10-CM

## 2019-11-12 ENCOUNTER — Other Ambulatory Visit: Payer: Self-pay | Admitting: Family Medicine

## 2019-11-12 DIAGNOSIS — G8929 Other chronic pain: Secondary | ICD-10-CM

## 2019-11-19 DIAGNOSIS — Z89422 Acquired absence of other left toe(s): Secondary | ICD-10-CM | POA: Diagnosis not present

## 2019-11-19 DIAGNOSIS — Z794 Long term (current) use of insulin: Secondary | ICD-10-CM | POA: Diagnosis not present

## 2019-11-19 DIAGNOSIS — M14672 Charcot's joint, left ankle and foot: Secondary | ICD-10-CM | POA: Diagnosis not present

## 2019-11-19 DIAGNOSIS — B351 Tinea unguium: Secondary | ICD-10-CM | POA: Diagnosis not present

## 2019-11-19 DIAGNOSIS — E1140 Type 2 diabetes mellitus with diabetic neuropathy, unspecified: Secondary | ICD-10-CM | POA: Diagnosis not present

## 2019-11-21 DIAGNOSIS — E1140 Type 2 diabetes mellitus with diabetic neuropathy, unspecified: Secondary | ICD-10-CM | POA: Diagnosis not present

## 2019-11-21 DIAGNOSIS — Z794 Long term (current) use of insulin: Secondary | ICD-10-CM | POA: Diagnosis not present

## 2019-11-21 DIAGNOSIS — L02416 Cutaneous abscess of left lower limb: Secondary | ICD-10-CM | POA: Diagnosis not present

## 2019-11-27 ENCOUNTER — Other Ambulatory Visit: Payer: Self-pay | Admitting: Family Medicine

## 2019-12-09 ENCOUNTER — Encounter: Payer: Self-pay | Admitting: Family Medicine

## 2019-12-09 ENCOUNTER — Ambulatory Visit (INDEPENDENT_AMBULATORY_CARE_PROVIDER_SITE_OTHER): Payer: Medicare Other | Admitting: Family Medicine

## 2019-12-09 ENCOUNTER — Other Ambulatory Visit: Payer: Self-pay

## 2019-12-09 VITALS — BP 120/80 | HR 70 | Temp 98.4°F | Ht 76.0 in | Wt 310.8 lb

## 2019-12-09 DIAGNOSIS — G8929 Other chronic pain: Secondary | ICD-10-CM

## 2019-12-09 DIAGNOSIS — E785 Hyperlipidemia, unspecified: Secondary | ICD-10-CM

## 2019-12-09 DIAGNOSIS — R351 Nocturia: Secondary | ICD-10-CM | POA: Insufficient documentation

## 2019-12-09 DIAGNOSIS — I739 Peripheral vascular disease, unspecified: Secondary | ICD-10-CM

## 2019-12-09 DIAGNOSIS — I10 Essential (primary) hypertension: Secondary | ICD-10-CM

## 2019-12-09 DIAGNOSIS — E119 Type 2 diabetes mellitus without complications: Secondary | ICD-10-CM | POA: Diagnosis not present

## 2019-12-09 DIAGNOSIS — M5442 Lumbago with sciatica, left side: Secondary | ICD-10-CM

## 2019-12-09 LAB — BASIC METABOLIC PANEL
BUN: 13 mg/dL (ref 6–23)
CO2: 29 mEq/L (ref 19–32)
Calcium: 9.5 mg/dL (ref 8.4–10.5)
Chloride: 96 mEq/L (ref 96–112)
Creatinine, Ser: 0.88 mg/dL (ref 0.40–1.50)
GFR: 92.83 mL/min (ref >60.00)
Glucose, Bld: 155 mg/dL — ABNORMAL HIGH (ref 70–99)
Potassium: 4.6 mEq/L (ref 3.5–5.1)
Sodium: 133 mEq/L — ABNORMAL LOW (ref 135–145)

## 2019-12-09 LAB — HEMOGLOBIN A1C: Hgb A1c MFr Bld: 7.7 % — ABNORMAL HIGH (ref 4.6–6.5)

## 2019-12-09 MED ORDER — TRAMADOL HCL 50 MG PO TABS: 50.0000 mg | ORAL_TABLET | Freq: Three times a day (TID) | ORAL | 0 refills | Status: DC | PRN

## 2019-12-09 NOTE — Assessment & Plan Note (Signed)
Encouraged him to follow-up with vascular surgery for yearly follow-up.  He will continue aspirin and Plavix.  Discussed risk of GI bleed with this combination.  Likely the benefit outweighs the risk given his vascular issues.  Advised of monitoring for blood in stool and melena.

## 2019-12-09 NOTE — Assessment & Plan Note (Signed)
Stable.  Continue tramadol as needed.  Refills given.  Controlled substance database reviewed.

## 2019-12-09 NOTE — Assessment & Plan Note (Signed)
At goal.  Continue amlodipine 10 mg once daily, lisinopril-HCTZ 1 tablet once daily, and metoprolol 50 mg once daily.  Check BMP.

## 2019-12-09 NOTE — Progress Notes (Signed)
Marikay Alar, MD Phone: [phone]  Joseph Singh is a 59 y.o. male who presents today for f/u.  HYPERTENSION  Disease Monitoring  Home BP Monitoring 120s/70s Chest pain- no    Dyspnea- no Medications  Compliance-  Taking amlodipine, HCTZ, lisinopril, metoprolol.   Edema- no  DIABETES Disease Monitoring: Blood Sugar ranges-not checking Polyuria/phagia/dipsia- no      Optho- UTD Medications: Compliance- taking jardiance, glipizide, metformin, ozempic Hypoglycemic symptoms- no  Urinary frequency: Patient notes he gets up every 2-3 hours to urinate at night.  He drinks 3 bottles of water right before bed.  Only urinates a couple of times during the day.  No other urinary symptoms.  Chronic back pain: Stable Continues on tramadol with good benefit.  He wonders if he can get this refilled for 3 months at a time.  PAD: Currently on aspirin and Plavix.  He wonders if he can come off of the aspirin.  He has had no GI bleed issues.    Social History   Tobacco Use  Smoking Status Former Smoker  . Packs/day: 0.50  . Years: 35.00  . Pack years: 17.50  . Types: Cigarettes  . Quit date: 08/09/2018  . Years since quitting: 1.3  Smokeless Tobacco Never Used     ROS see history of present illness  Objective  Physical Exam Vitals:   12/09/19 0903  BP: 120/80  Pulse: 70  Temp: 98.4 F (36.9 C)  SpO2: 99%    BP Readings from Last 3 Encounters:  12/09/19 120/80  09/03/19 130/70  06/05/19 118/70   Wt Readings from Last 3 Encounters:  12/09/19 (!) 310 lb 12.8 oz (141 kg)  09/03/19 (!) 308 lb 6.4 oz (139.9 kg)  06/18/19 (!) 309 lb (140.2 kg)    Physical Exam Constitutional:      General: He is not in acute distress.    Appearance: He is not diaphoretic.  Cardiovascular:     Rate and Rhythm: Normal rate and regular rhythm.     Heart sounds: Normal heart sounds.  Pulmonary:     Effort: Pulmonary effort is normal.     Breath sounds: Normal breath sounds.  Skin:     General: Skin is warm and dry.  Neurological:     Mental Status: He is alert.      Assessment/Plan: Please see individual problem list.  Problem List Items Addressed This Visit    Back pain    Stable.  Continue tramadol as needed.  Refills given.  Controlled substance database reviewed.      Relevant Medications   traMADol (ULTRAM) 50 MG tablet (Start on 01/13/2020)   traMADol (ULTRAM) 50 MG tablet (Start on 02/12/2020)   traMADol (ULTRAM) 50 MG tablet   Essential hypertension    At goal.  Continue amlodipine 10 mg once daily, lisinopril-HCTZ 1 tablet once daily, and metoprolol 50 mg once daily.  Check BMP.      Relevant Orders   Basic Metabolic Panel (BMET)   Hyperlipidemia    Continue Lipitor 40 mg once daily.  I advised is okay for him to take co-Q10 with this.  Check LDL.      Nocturia    I suspect this is related to excessive fluid intake at night.  Advised to decrease his fluid intake at night and increase fluid intake during the day.  If not improving he will let us know.      PVD (peripheral vascular disease) [name] - Primary    Encouraged him  to follow-up with vascular surgery for yearly follow-up.  He will continue aspirin and Plavix.  Discussed risk of GI bleed with this combination.  Likely the benefit outweighs the risk given his vascular issues.  Advised of monitoring for blood in stool and melena.      Type 2 diabetes mellitus without complication, without long-term current use of insulin [name]    Check A1c.  Continue Jardiance 25 mg once daily, Ozempic 1 mg once weekly, Metformin 1000 mg once daily, and glipizide 10 mg daily.      Relevant Orders   HgB A1c       Health Maintenance: Declines flu vaccine and Covid vaccine.   This visit occurred during the SARS-CoV-2 public health emergency.  Safety protocols were in place, including screening questions prior to the visit, additional usage of staff PPE, and extensive cleaning of exam room while observing  appropriate contact time as indicated for disinfecting solutions.    Marikay Alar, MD [facility]

## 2019-12-09 NOTE — Assessment & Plan Note (Signed)
Check A1c.  Continue Jardiance 25 mg once daily, Ozempic 1 mg once weekly, Metformin 1000 mg once daily, and glipizide 10 mg daily.

## 2019-12-09 NOTE — Patient Instructions (Signed)
Nice to see you. Please contact vascular surgery to schedule follow-up. We will contact you with your lab results. Please drink more water during the day and less at night.

## 2019-12-09 NOTE — Assessment & Plan Note (Signed)
Continue Lipitor 40 mg once daily.  I advised is okay for him to take co-Q10 with this.  Check LDL.

## 2019-12-09 NOTE — Assessment & Plan Note (Signed)
I suspect this is related to excessive fluid intake at night.  Advised to decrease his fluid intake at night and increase fluid intake during the day.  If not improving he will let us know.

## 2019-12-11 ENCOUNTER — Other Ambulatory Visit: Payer: Self-pay | Admitting: Family Medicine

## 2020-02-24 ENCOUNTER — Other Ambulatory Visit: Payer: Self-pay | Admitting: Family Medicine

## 2020-02-26 ENCOUNTER — Other Ambulatory Visit: Payer: Self-pay | Admitting: Family Medicine

## 2020-02-27 ENCOUNTER — Other Ambulatory Visit: Payer: Self-pay

## 2020-02-27 DIAGNOSIS — E119 Type 2 diabetes mellitus without complications: Secondary | ICD-10-CM

## 2020-02-27 DIAGNOSIS — I10 Essential (primary) hypertension: Secondary | ICD-10-CM

## 2020-02-27 MED ORDER — EMPAGLIFLOZIN 25 MG PO TABS: 25.0000 mg | ORAL_TABLET | Freq: Every day | ORAL | 3 refills | Status: DC

## 2020-02-27 MED ORDER — METOPROLOL SUCCINATE ER 50 MG PO TB24: 50.0000 mg | ORAL_TABLET | Freq: Every day | ORAL | 1 refills | Status: DC

## 2020-03-10 ENCOUNTER — Encounter: Payer: Self-pay | Admitting: Family Medicine

## 2020-03-10 ENCOUNTER — Telehealth (INDEPENDENT_AMBULATORY_CARE_PROVIDER_SITE_OTHER): Payer: Medicare Other | Admitting: Family Medicine

## 2020-03-10 DIAGNOSIS — E119 Type 2 diabetes mellitus without complications: Secondary | ICD-10-CM

## 2020-03-10 DIAGNOSIS — I10 Essential (primary) hypertension: Secondary | ICD-10-CM

## 2020-03-10 DIAGNOSIS — E1142 Type 2 diabetes mellitus with diabetic polyneuropathy: Secondary | ICD-10-CM

## 2020-03-10 DIAGNOSIS — G8929 Other chronic pain: Secondary | ICD-10-CM | POA: Diagnosis not present

## 2020-03-10 DIAGNOSIS — M5442 Lumbago with sciatica, left side: Secondary | ICD-10-CM | POA: Diagnosis not present

## 2020-03-10 DIAGNOSIS — Z8616 Personal history of COVID-19: Secondary | ICD-10-CM | POA: Diagnosis not present

## 2020-03-10 MED ORDER — TRAMADOL HCL 50 MG PO TABS: 50.0000 mg | ORAL_TABLET | Freq: Three times a day (TID) | ORAL | 0 refills | Status: DC | PRN

## 2020-03-10 MED ORDER — TRAMADOL HCL 50 MG PO TABS
50.0000 mg | ORAL_TABLET | Freq: Three times a day (TID) | ORAL | 0 refills | Status: DC | PRN
Start: 2020-05-08 — End: 2020-06-10

## 2020-03-10 NOTE — Progress Notes (Signed)
Virtual Visit via video Note  This visit type was conducted due to national recommendations for restrictions regarding the COVID-19 pandemic (e.g. social distancing).  This format is felt to be most appropriate for this patient at this time.  All issues noted in this document were discussed and addressed.  No physical exam was performed (except for noted visual exam findings with Video Visits).   I connected with Joseph Singh today at 10:30 AM EST by a video enabled telemedicine application and verified that I am speaking with the correct person using two identifiers. Location patient: home Location provider: home office Persons participating in the virtual visit: patient, provider  I discussed the limitations, risks, security and privacy concerns of performing an evaluation and management service by telephone and the availability of in person appointments. I also discussed with the patient that there may be a patient responsible charge related to this service. The patient expressed understanding and agreed to proceed.  Reason for visit: f/u  HPI: COVID 27: Patient notes he developed symptoms on 02/11/2020.  Had fever and vomiting and diarrhea.  His wife had similar symptoms though tested positive earlier than he did.  He notes they found a doctor who prescribed him ivermectin and azithromycin as well as a nebulizer.  He notes progressive improvement though is still getting winded and has been on oxygen by nasal cannula at home.  Notes typically his oxygen drops to less than 90 if he is not wearing the oxygen and being active.  He feels as though generally he is improving progressively.  He is considering getting the COVID-19 vaccine.  Diabetes: Not checking sugars.  He is taking Ozempic, metformin, glipizide, and Jardiance.  No polyuria or polydipsia.  No hypoglycemia.  Hypertension: Typically 130s over 70s.  Taking amlodipine, lisinopril, HCTZ, and metoprolol.  No chest pain.  No  edema.  Neuropathy: Continues to have issues in his feet though is now having some issues in his fingers bilaterally.  He notes the tramadol does help control this.   ROS: See pertinent positives and negatives per HPI.  Past Medical History:  Diagnosis Date  . Arthritis   . Diabetes mellitus without complication ([place])   . History of kidney stones    h/o  . Hyperlipidemia   . Hypertension   . Neuropathy     Past Surgical History:  Procedure Laterality Date  . AMPUTATION TOE Left 08/10/2018   Procedure: RAY LEFT;  Surgeon: Samara Deist, DPM;  Location: [facility];  Service: Podiatry;  Laterality: Left;  . ARTHRODESIS METATARSAL Left 02/01/2019   Procedure: ARTHRODESIS METATARSAL;  Surgeon: Samara Deist, DPM;  Location: [facility];  Service: Podiatry;  Laterality: Left;  . FOOT ARTHRODESIS Left 02/01/2019   Procedure: ARTHRODESIS FOOT;STJ;  Surgeon: Samara Deist, DPM;  Location: [facility];  Service: Podiatry;  Laterality: Left;  . IRRIGATION AND DEBRIDEMENT FOOT Left 10/26/2018   Procedure: IRRIGATION AND DEBRIDEMENT FOOT, LEFT;  Surgeon: Samara Deist, DPM;  Location: [facility];  Service: Podiatry;  Laterality: Left;  . LOWER EXTREMITY ANGIOGRAPHY Right 02/25/2016   Procedure: Lower Extremity Angiography;  Surgeon: Algernon Huxley, MD;  Location: [place] [facility];  Service: Cardiovascular;  Laterality: Right;  . LOWER EXTREMITY ANGIOGRAPHY Right 02/27/2017   Procedure: LOWER EXTREMITY ANGIOGRAPHY;  Surgeon: Algernon Huxley, MD;  Location: [place] [facility];  Service: Cardiovascular;  Laterality: Right;  . LOWER EXTREMITY ANGIOGRAPHY Left 08/06/2018   Procedure: LOWER EXTREMITY ANGIOGRAPHY;  Surgeon: Algernon Huxley, MD;  Location:  [place] [facility];  Service: Cardiovascular;  Laterality: Left;  . LOWER EXTREMITY INTERVENTION  02/25/2016   Procedure: Lower Extremity Intervention;  Surgeon: Algernon Huxley, MD;  Location: [place] [facility];  Service: Cardiovascular;;  . TOE AMPUTATION       Family History  Problem Relation Age of Onset  . Diabetes Mother   . Heart disease Father   . Diabetes Sister   . Diabetes Brother   . Cancer Sister        lung  . Cancer Sister        breast    SOCIAL HX: Former smoker   Current Outpatient Medications:  .  amLODipine (NORVASC) 10 MG tablet, Take 1 tablet (10 mg total) by mouth daily., Disp: 90 tablet, Rfl: 3 .  aspirin EC 81 MG tablet, Take 1 tablet (81 mg total) by mouth daily., Disp: 150 tablet, Rfl: 2 .  atorvastatin (LIPITOR) 40 MG tablet, TAKE ONE TABLET BY MOUTH EVERY DAY, Disp: 90 tablet, Rfl: 3 .  blood glucose meter kit and supplies KIT, E11.51, check 2x daily, dispense based on patient and insurance preference, Disp: 1 each, Rfl: 0 .  clopidogrel (PLAVIX) 75 MG tablet, TAKE ONE TABLET BY MOUTH EVERY DAY, Disp: 90 tablet, Rfl: 3 .  empagliflozin (JARDIANCE) 25 MG TABS tablet, Take 1 tablet (25 mg total) by mouth daily., Disp: 90 tablet, Rfl: 3 .  gabapentin (NEURONTIN) 300 MG capsule, TAKE 1 CAPSULE BY MOUTH FOUR TIMES DAILY, Disp: 360 capsule, Rfl: 2 .  glipiZIDE (GLUCOTROL XL) 10 MG 24 hr tablet, TAKE ONE TABLET BY MOUTH EACH DAY WITH BREAKFAST, Disp: 90 tablet, Rfl: 3 .  glucose blood (ONE TOUCH ULTRA TEST) test strip, Check blood sugar twice daily Dx code: E11.9, Disp: 200 each, Rfl: 0 .  lisinopril-hydrochlorothiazide (ZESTORETIC) 10-12.5 MG tablet, Take 1 tablet by mouth daily., Disp: 90 tablet, Rfl: 3 .  metFORMIN (GLUCOPHAGE) 1000 MG tablet, TAKE 1 TABLET BY MOUTH TWICE DAILY WITH A MEAL, Disp: 90 tablet, Rfl: 1 .  metoprolol succinate (TOPROL-XL) 50 MG 24 hr tablet, Take 1 tablet (50 mg total) by mouth daily. Take with or immediately following a meal., Disp: 90 tablet, Rfl: 1 .  OZEMPIC, 1 MG/DOSE, 4 MG/3ML SOPN, INJECT $RemoveBefo'1MG'ZRucgjJarat$  (0.75ML) SUBCUTANEOUSLY ONCE PER WEEK, Disp: 6 mL, Rfl: 0 .  clindamycin (CLEOCIN) 300 MG capsule, Take 1 capsule (300 mg total) by mouth 4 (four) times daily. (Patient not taking: Reported on  12/09/2019), Disp: 40 capsule, Rfl: 0 .  [START ON 05/08/2020] traMADol (ULTRAM) 50 MG tablet, Take 1 tablet (50 mg total) by mouth every 8 (eight) hours as needed for moderate pain., Disp: 90 tablet, Rfl: 0 .  [START ON 04/10/2020] traMADol (ULTRAM) 50 MG tablet, Take 1 tablet (50 mg total) by mouth every 8 (eight) hours as needed for moderate pain., Disp: 90 tablet, Rfl: 0 .  traMADol (ULTRAM) 50 MG tablet, Take 1 tablet (50 mg total) by mouth every 8 (eight) hours as needed for moderate pain., Disp: 90 tablet, Rfl: 0  EXAM:  VITALS per patient if applicable:  GENERAL: alert, oriented, appears well and in no acute distress  HEENT: atraumatic, conjunttiva clear, no obvious abnormalities on inspection of external nose and ears  NECK: normal movements of the head and neck  LUNGS: on inspection no signs of respiratory distress, breathing rate appears normal, no obvious gross SOB, gasping or wheezing  CV: no obvious cyanosis  MS: moves all visible extremities without noticeable abnormality  PSYCH/NEURO: pleasant and cooperative, no obvious depression or anxiety, speech and thought processing grossly intact  ASSESSMENT AND PLAN:  Discussed the following assessment and plan:  Problem List Items Addressed This Visit    Back pain   Relevant Medications   traMADol (ULTRAM) 50 MG tablet (Start on 05/08/2020)   traMADol (ULTRAM) 50 MG tablet (Start on 04/10/2020)   traMADol (ULTRAM) 50 MG tablet   Diabetic neuropathy [name]    Refill tramadol.  Controlled substance database reviewed.      Essential hypertension    Stable.  He will continue amlodipine, lisinopril, HCTZ, and metoprolol.  He will come in for labs.      Relevant Orders   Basic Metabolic Panel (BMET)   History of COVID-19    Continues to recover from prior COVID-19 infection.  He appears to be progressively improving.  I encouraged him to continue on his oxygen at this time given that his oxygen saturation has been less than  90% while not wearing it.  He will continue to follow with the physician that he was seeing for this issue.      Type 2 diabetes mellitus without complication, without long-term current use of insulin ([place])    He will continue Ozempic, metformin, glipizide, and Jardiance.  Check labs.      Relevant Orders   Hemoglobin A1c       I discussed the assessment and treatment plan with the patient. The patient was provided an opportunity to ask questions and all were answered. The patient agreed with the plan and demonstrated an understanding of the instructions.   The patient was advised to call back or seek an in-person evaluation if the symptoms worsen or if the condition fails to improve as anticipated.   [name] Rumps, MD

## 2020-03-10 NOTE — Assessment & Plan Note (Signed)
Refill tramadol.  Controlled substance database reviewed.

## 2020-03-10 NOTE — Assessment & Plan Note (Signed)
Continues to recover from prior COVID-19 infection.  He appears to be progressively improving.  I encouraged him to continue on his oxygen at this time given that his oxygen saturation has been less than 90% while not wearing it.  He will continue to follow with the physician that he was seeing for this issue.

## 2020-03-10 NOTE — Assessment & Plan Note (Signed)
Stable.  He will continue amlodipine, lisinopril, HCTZ, and metoprolol.  He will come in for labs.

## 2020-03-10 NOTE — Assessment & Plan Note (Signed)
He will continue Ozempic, metformin, glipizide, and Jardiance.  Check labs.

## 2020-03-13 ENCOUNTER — Other Ambulatory Visit: Payer: Self-pay | Admitting: Family Medicine

## 2020-03-13 DIAGNOSIS — Z8616 Personal history of COVID-19: Secondary | ICD-10-CM | POA: Diagnosis not present

## 2020-03-13 DIAGNOSIS — G629 Polyneuropathy, unspecified: Secondary | ICD-10-CM

## 2020-03-20 DIAGNOSIS — U071 COVID-19: Secondary | ICD-10-CM | POA: Diagnosis not present

## 2020-04-20 DIAGNOSIS — U071 COVID-19: Secondary | ICD-10-CM | POA: Diagnosis not present

## 2020-04-21 ENCOUNTER — Other Ambulatory Visit: Payer: Self-pay | Admitting: Family Medicine

## 2020-05-08 ENCOUNTER — Other Ambulatory Visit: Payer: Self-pay | Admitting: Family Medicine

## 2020-05-08 DIAGNOSIS — G8929 Other chronic pain: Secondary | ICD-10-CM

## 2020-05-08 DIAGNOSIS — M5442 Lumbago with sciatica, left side: Secondary | ICD-10-CM

## 2020-05-08 NOTE — Telephone Encounter (Signed)
The patient is due for follow-up.  Please get him scheduled and then I will refill to cover until he is able to get into the office.

## 2020-05-14 DIAGNOSIS — M14672 Charcot's joint, left ankle and foot: Secondary | ICD-10-CM | POA: Diagnosis not present

## 2020-05-14 DIAGNOSIS — B351 Tinea unguium: Secondary | ICD-10-CM | POA: Diagnosis not present

## 2020-05-14 DIAGNOSIS — Z89422 Acquired absence of other left toe(s): Secondary | ICD-10-CM | POA: Diagnosis not present

## 2020-05-14 DIAGNOSIS — I70213 Atherosclerosis of native arteries of extremities with intermittent claudication, bilateral legs: Secondary | ICD-10-CM | POA: Diagnosis not present

## 2020-06-05 IMAGING — XA DG FOOT COMPLETE 3+V*L*
6 series · 6 of 6 positions shown · IV contrast (agent unspecified)
Comparison: None.

CLINICAL DATA: Left foot arthrodesis.

EXAM:
DG C-ARM 1-60 MIN; LEFT FOOT - COMPLETE 3+ VIEW
CONTRAST:  None.
FLUOROSCOPY TIME:  Fluoroscopy Time:  2 minutes 28 seconds.
Number of Acquired Spot Images: 6.

[Series 1: cont. · 1 of 1 slices shown (1 of 6)]
[im 1/1]
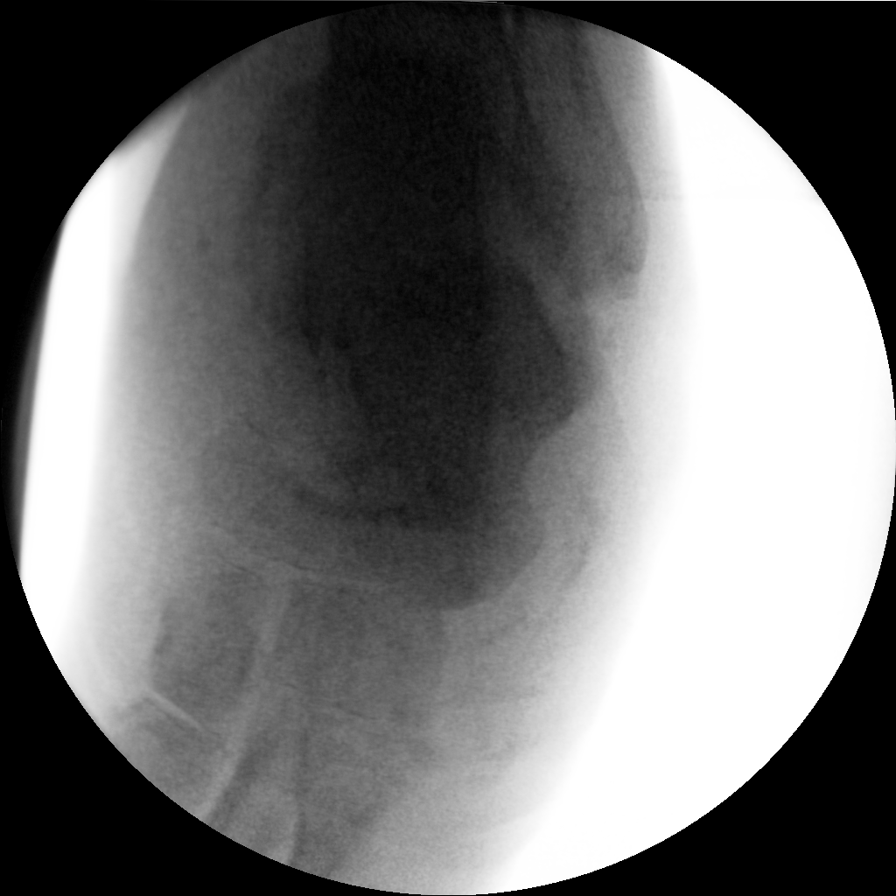

[Series 13: cont. · 1 of 1 slices shown (2 of 6)]
[im 1/1]
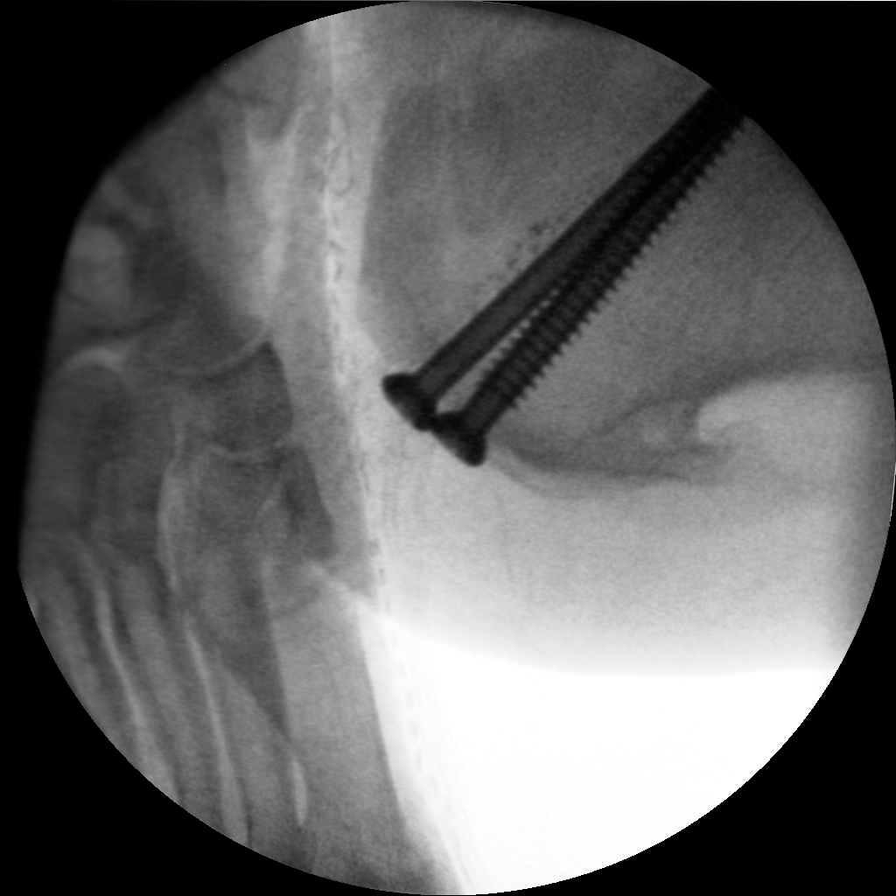

[Series 15: cont. · 1 of 1 slices shown (3 of 6)]
[im 1/1]
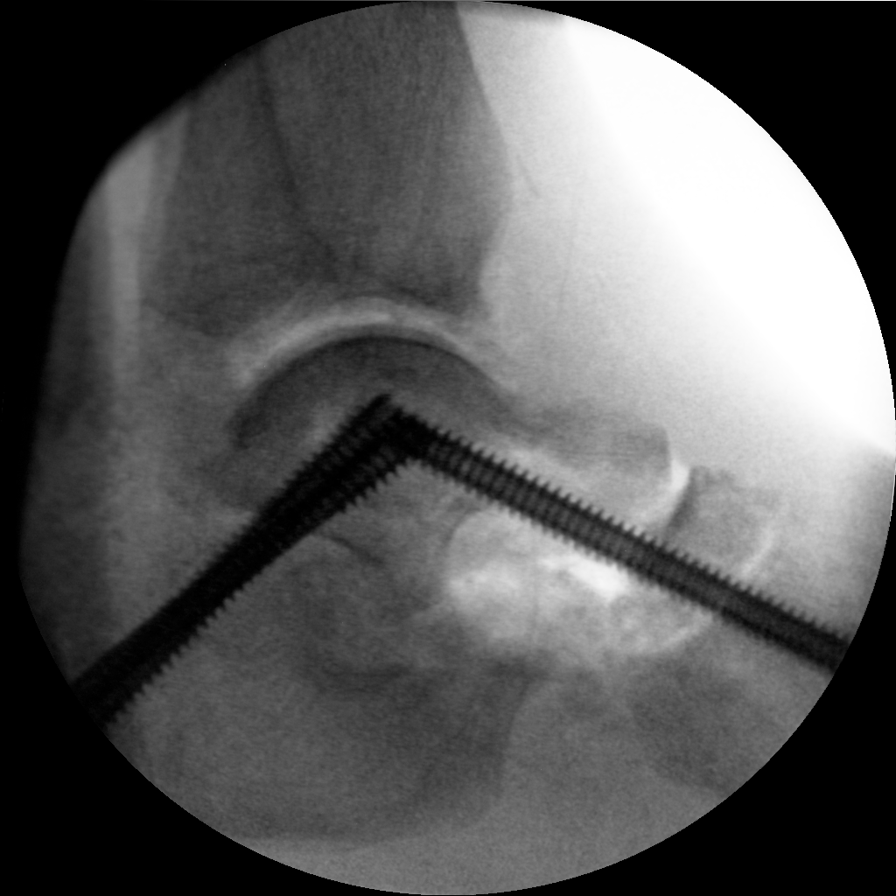

[Series 16: cont. · 1 of 1 slices shown (4 of 6)]
[im 1/1]
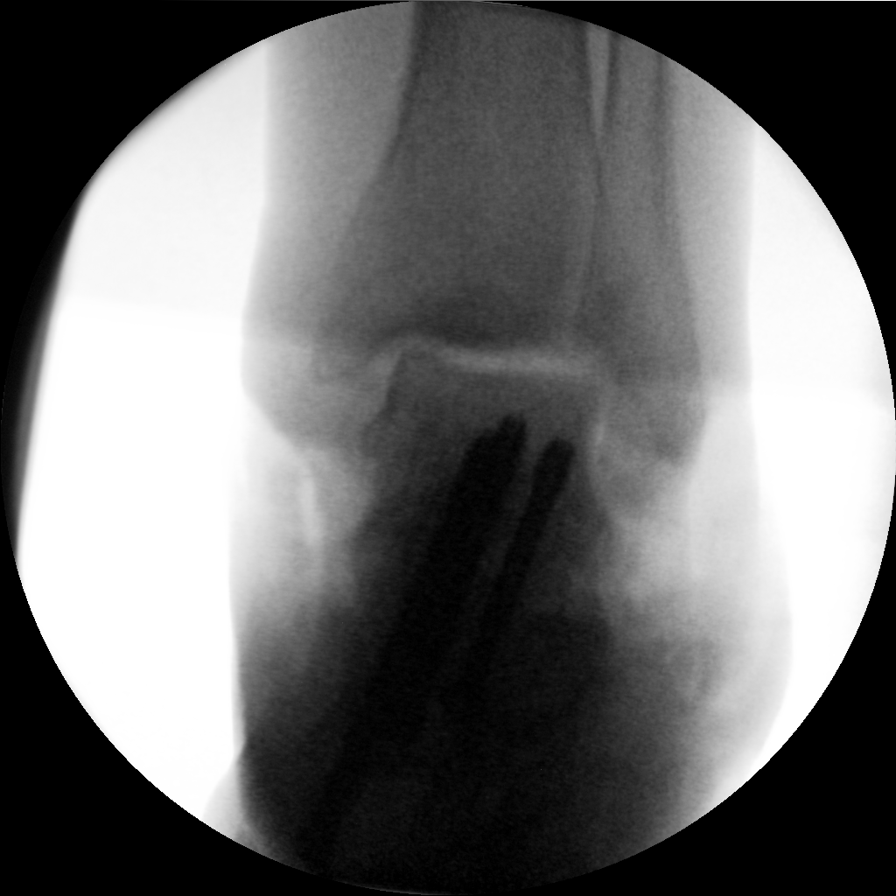

[Series 17: cont. · 1 of 1 slices shown (5 of 6)]
[im 1/1]
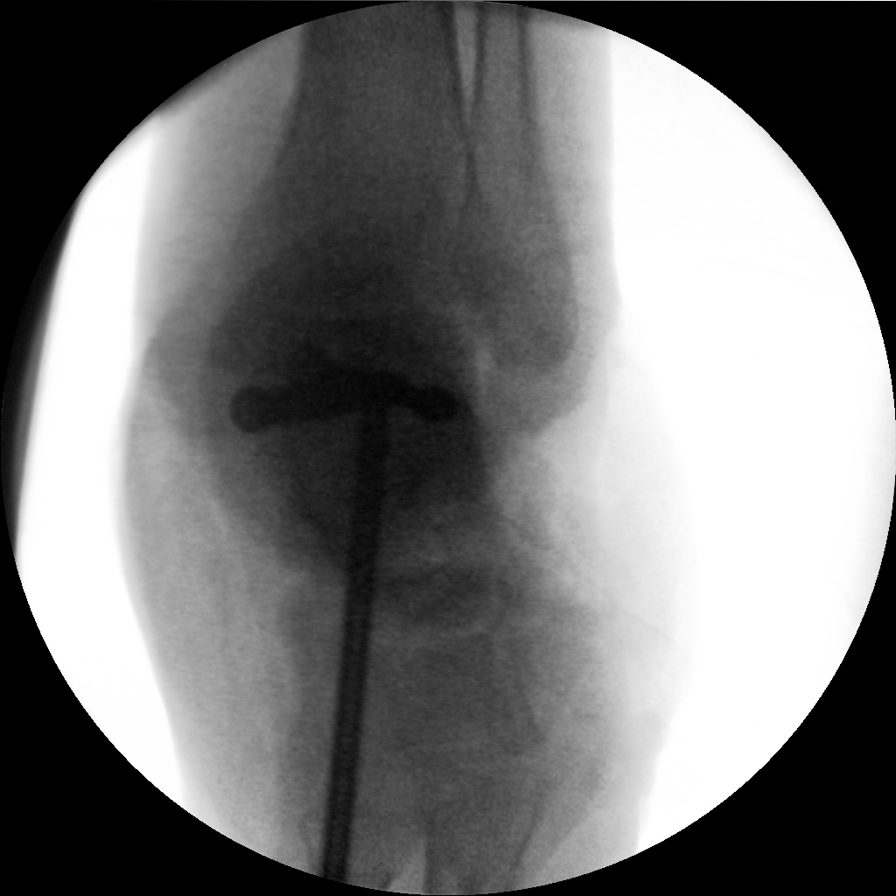

[Series 18: cont. · 1 of 1 slices shown (6 of 6)]
[im 1/1]
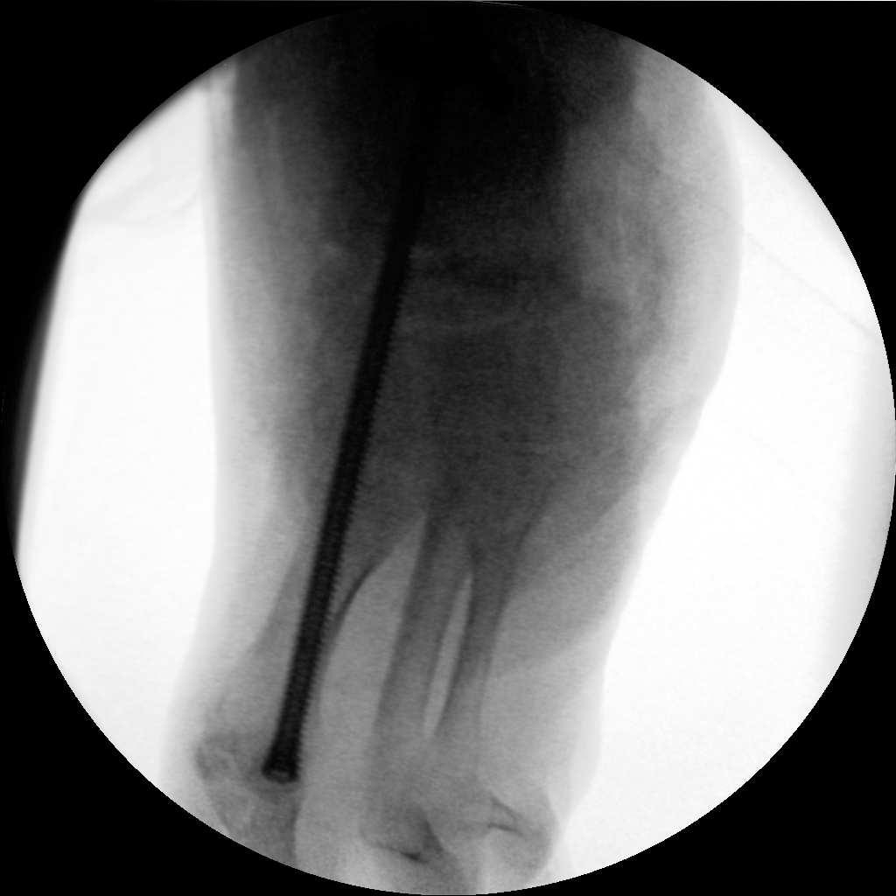

[6 of 6 positions shown; findings below may reference images not displayed]

FINDINGS: Six intraoperative fluoroscopic images were obtained of the left
foot. These demonstrate surgical fusion of the talocalcaneal joint
as well as the talotarsal and metatarsal joints.
IMPRESSION: Fluoroscopic guidance provided during left foot arthrodesis.

## 2020-06-05 IMAGING — XA DG C-ARM 1-60 MIN
6 series · 6 of 6 positions shown · IV contrast (agent unspecified)
Comparison: None.

CLINICAL DATA: Left foot arthrodesis.

EXAM:
DG C-ARM 1-60 MIN; LEFT FOOT - COMPLETE 3+ VIEW
CONTRAST:  None.
FLUOROSCOPY TIME:  Fluoroscopy Time:  2 minutes 28 seconds.
Number of Acquired Spot Images: 6.

[Series 1: cont. · 1 of 1 slices shown (1 of 6)]
[im 1/1]
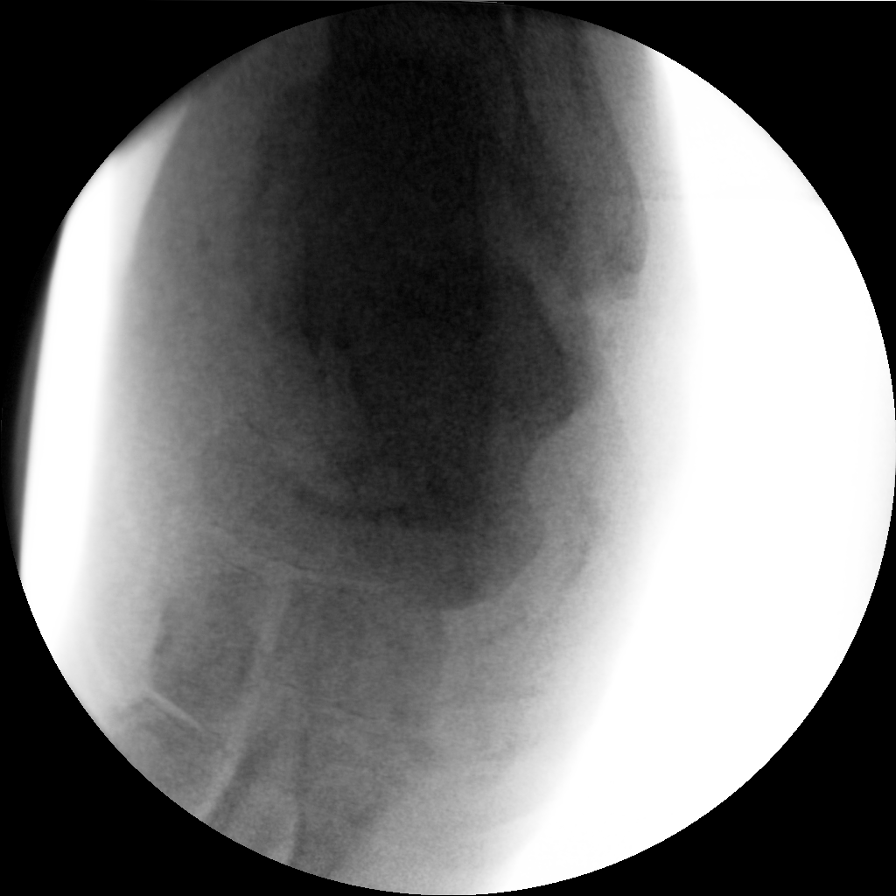

[Series 13: cont. · 1 of 1 slices shown (2 of 6)]
[im 1/1]
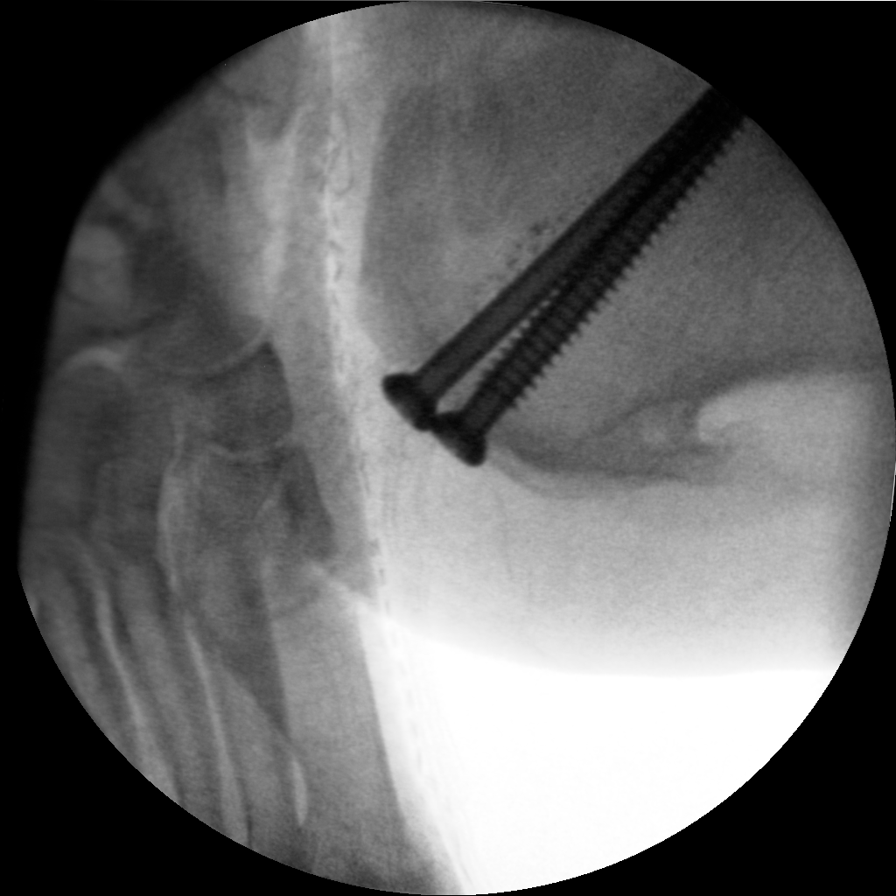

[Series 15: cont. · 1 of 1 slices shown (3 of 6)]
[im 1/1]
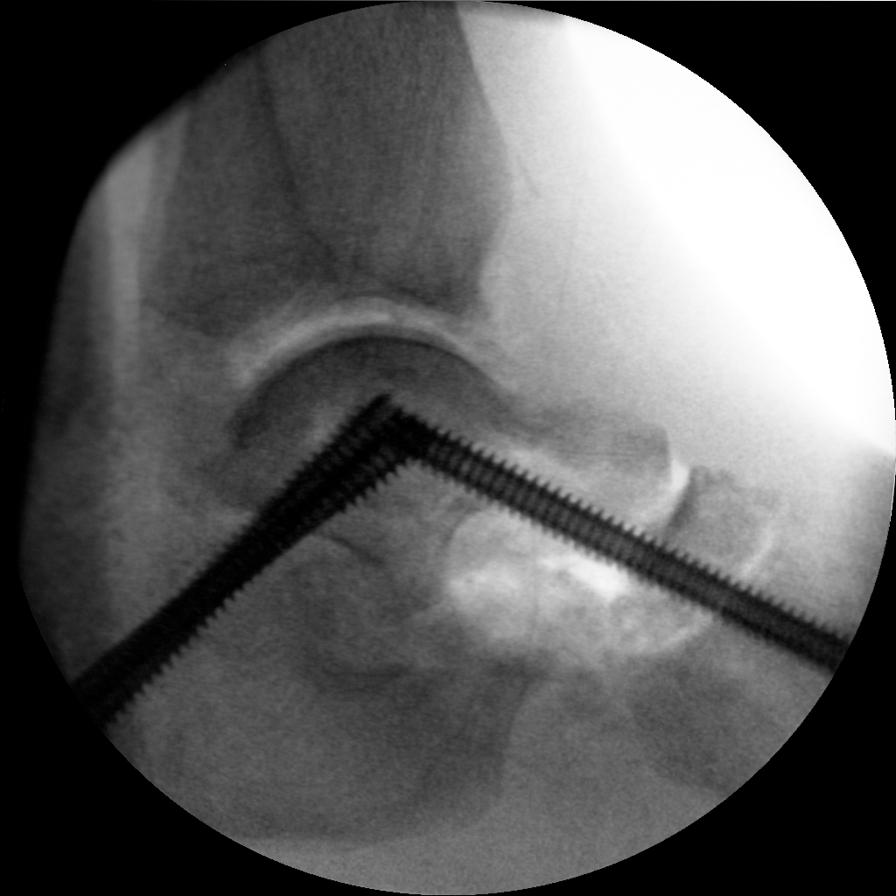

[Series 16: cont. · 1 of 1 slices shown (4 of 6)]
[im 1/1]
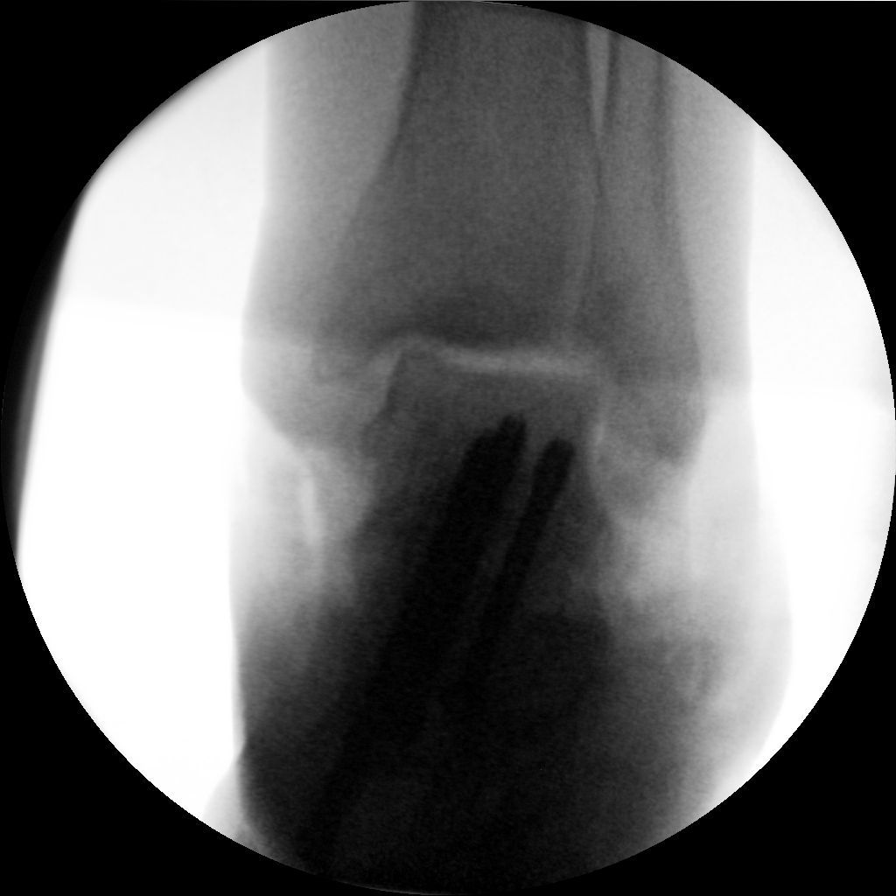

[Series 17: cont. · 1 of 1 slices shown (5 of 6)]
[im 1/1]
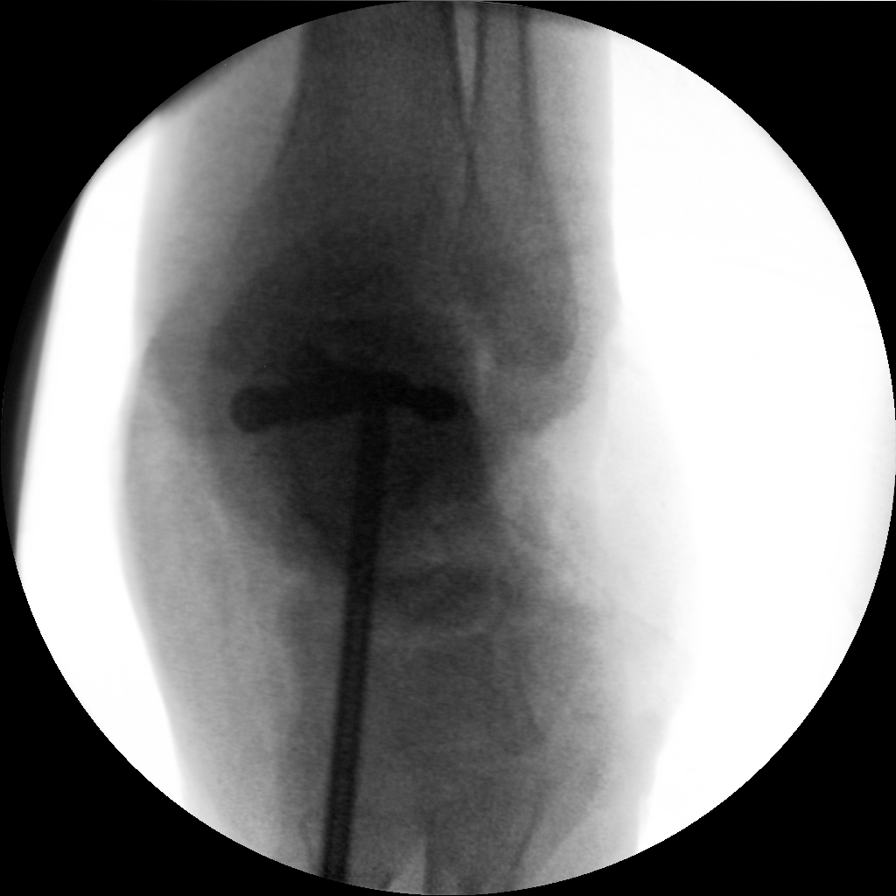

[Series 18: cont. · 1 of 1 slices shown (6 of 6)]
[im 1/1]
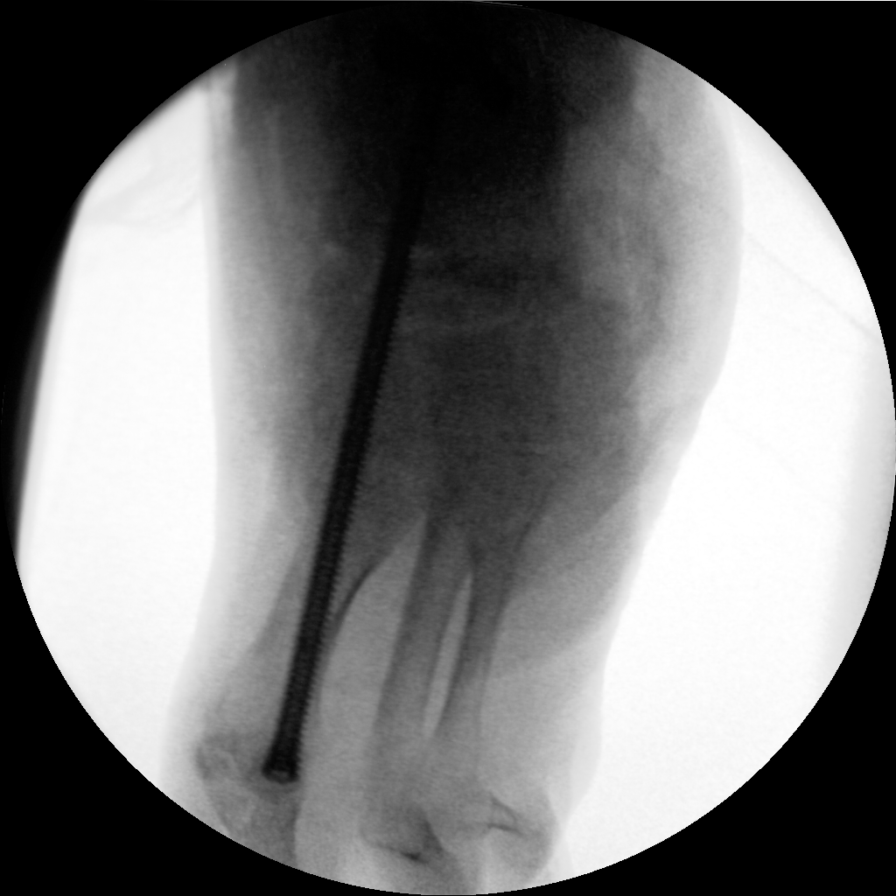

[6 of 6 positions shown; findings below may reference images not displayed]

FINDINGS: Six intraoperative fluoroscopic images were obtained of the left
foot. These demonstrate surgical fusion of the talocalcaneal joint
as well as the talotarsal and metatarsal joints.
IMPRESSION: Fluoroscopic guidance provided during left foot arthrodesis.

## 2020-06-05 IMAGING — XA DG C-ARM 1-60 MIN
5 series · 5 of 5 positions shown · IV contrast (agent unspecified)
Comparison: None.

CLINICAL DATA: Left foot arthrodesis.

EXAM:
DG C-ARM 1-60 MIN; LEFT FOOT - COMPLETE 3+ VIEW
CONTRAST:  None.
FLUOROSCOPY TIME:  Fluoroscopy Time:  2 minutes 28 seconds.
Number of Acquired Spot Images: 6.

[Series 1: cont. · 1 of 1 slices shown (1 of 5)]
[im 1/1]
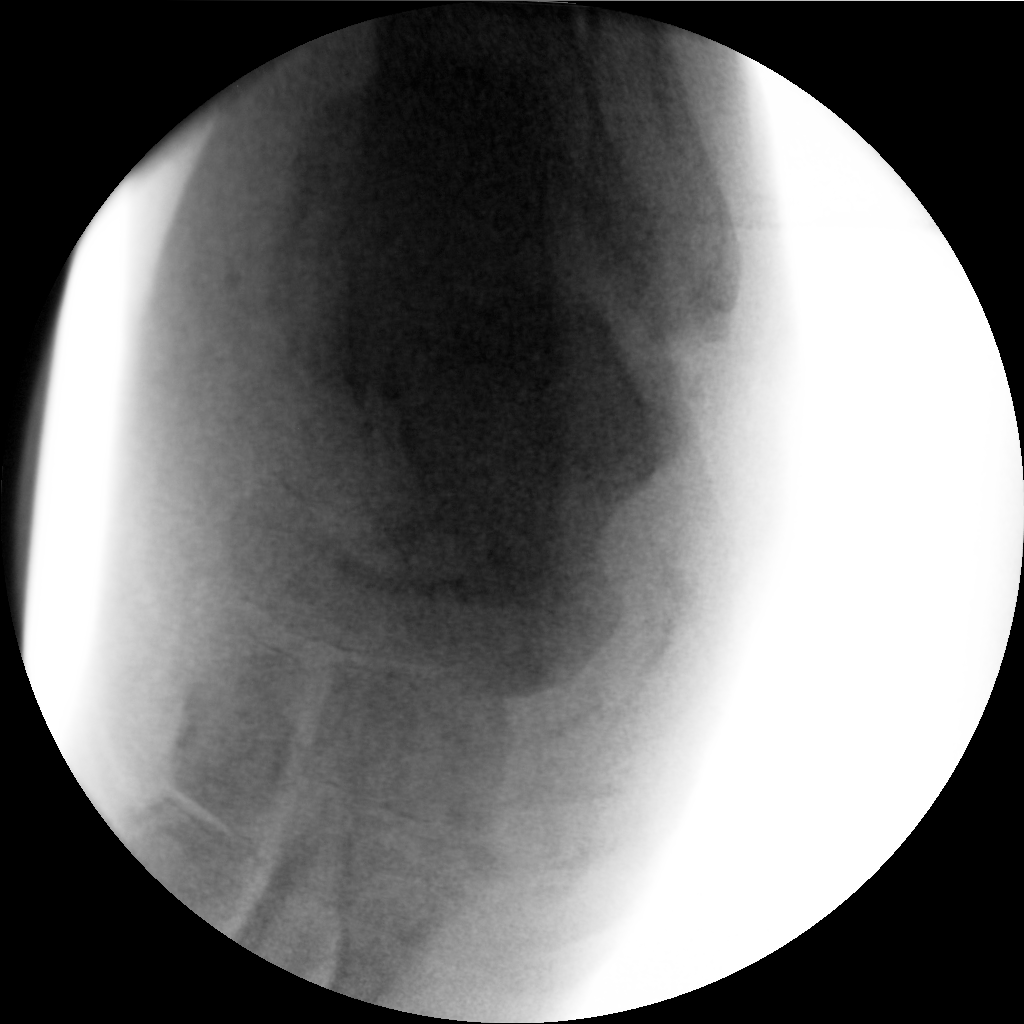

[Series 13: cont. · 1 of 1 slices shown (2 of 5)]
[im 1/1]
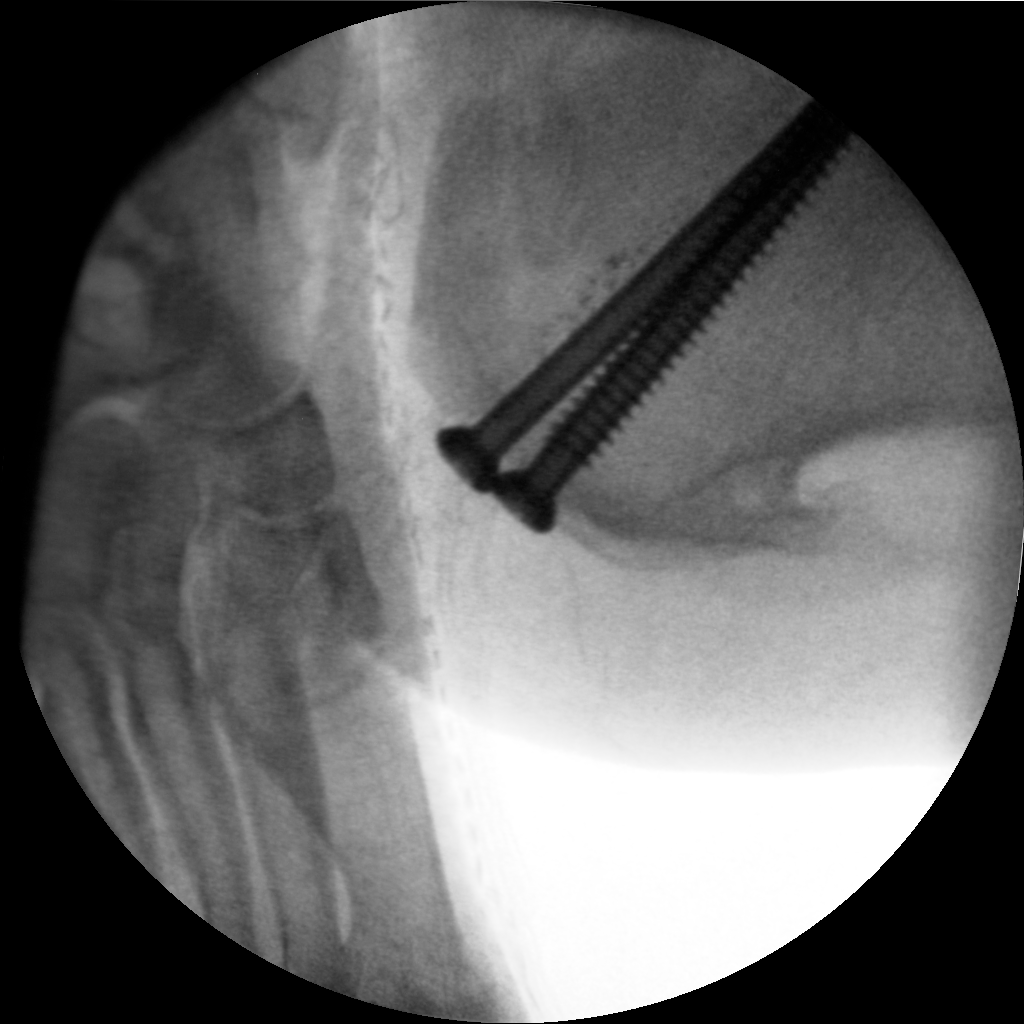

[Series 15: cont. · 1 of 1 slices shown (3 of 5)]
[im 1/1]
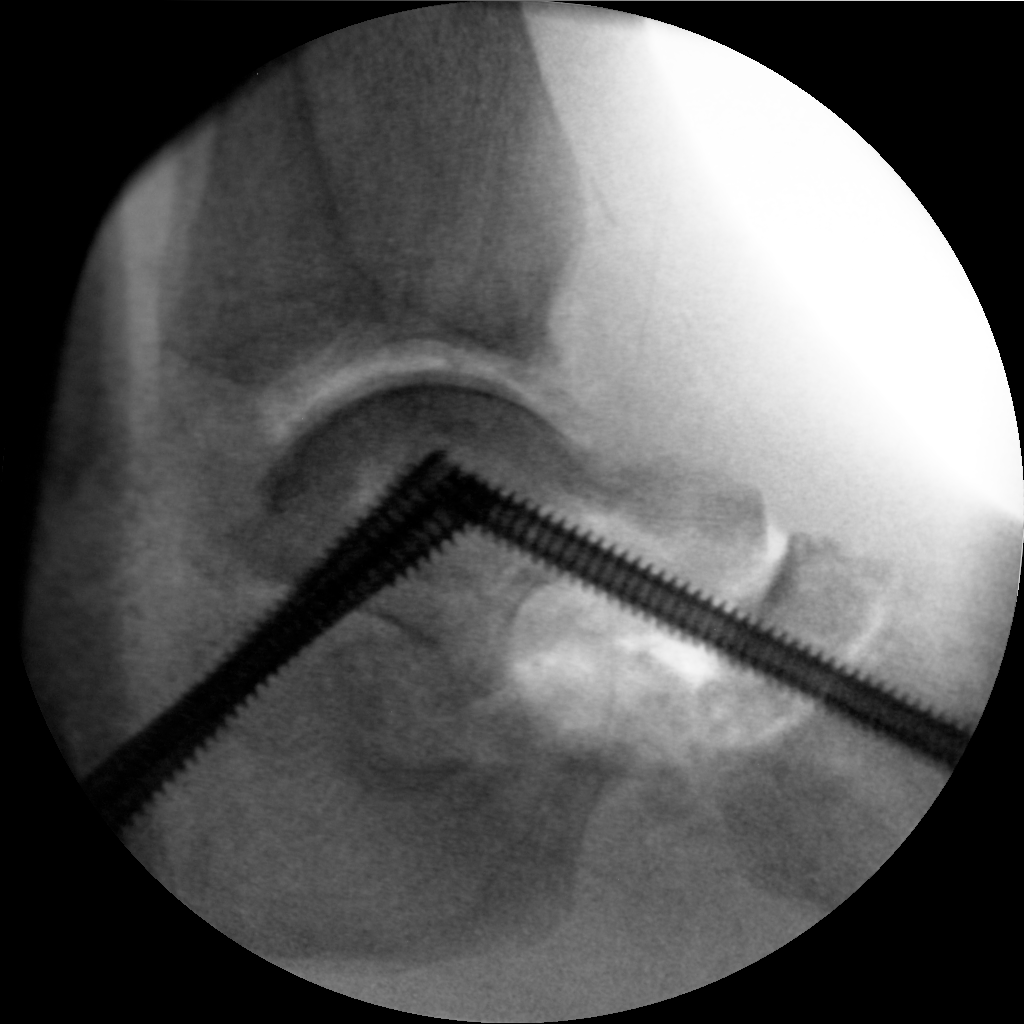

[Series 16: cont. · 1 of 1 slices shown (4 of 5)]
[im 1/1]
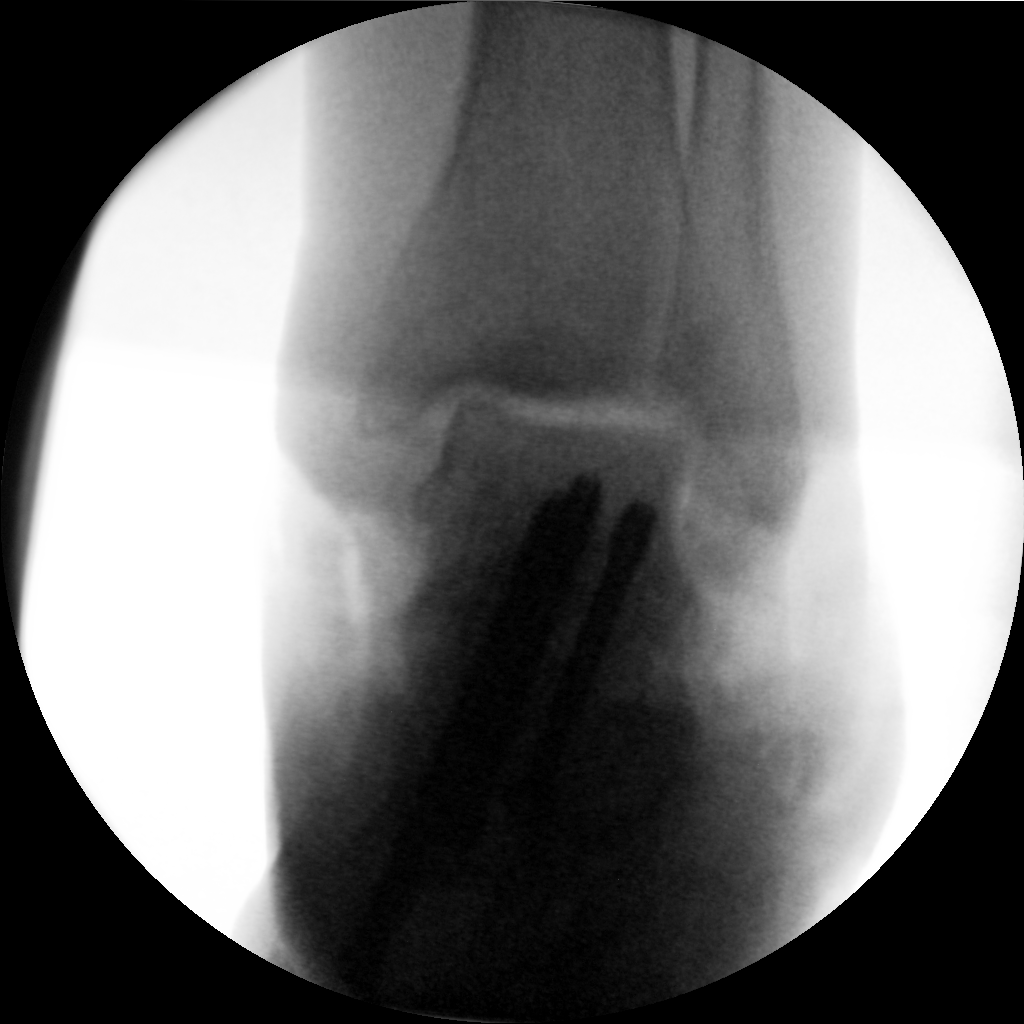

[Series 17: cont. · 1 of 1 slices shown (5 of 5)]
[im 1/1]
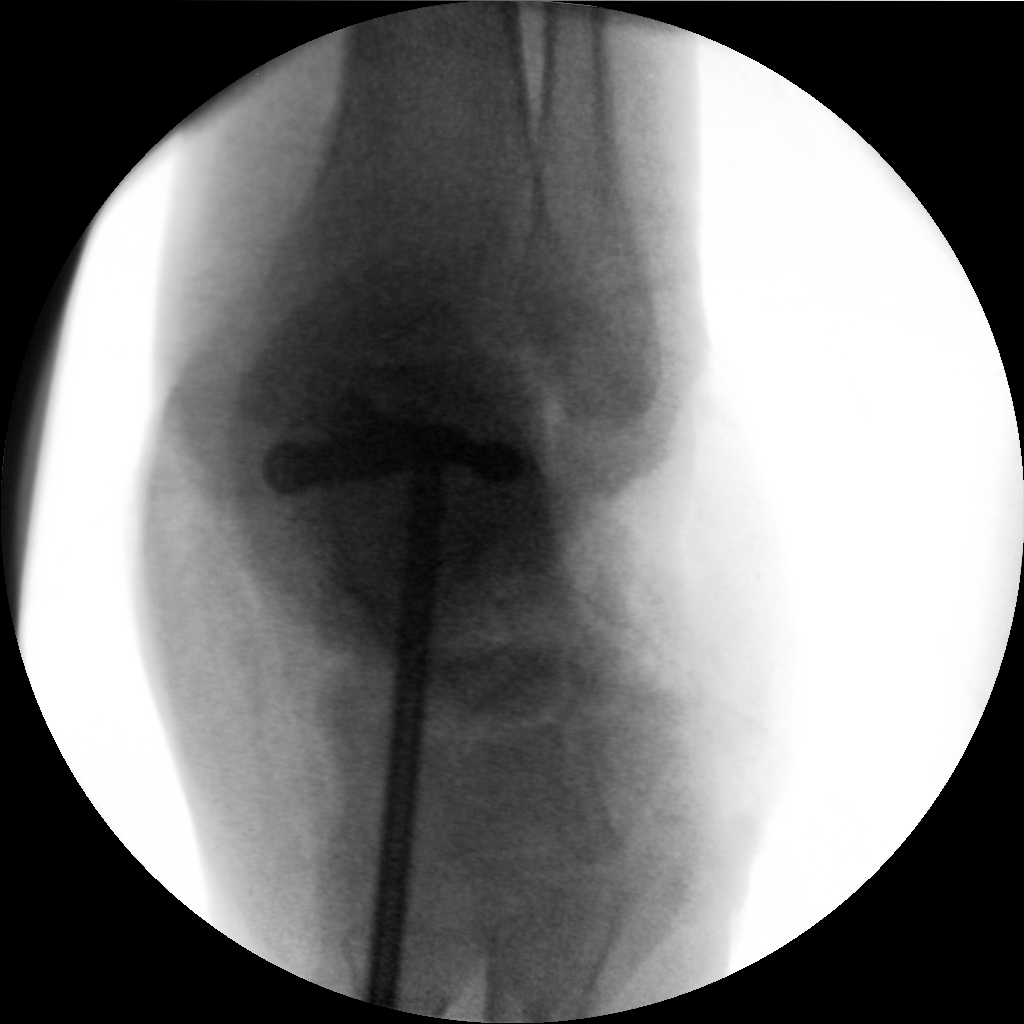

[5 of 5 positions shown; findings below may reference images not displayed]

FINDINGS: Six intraoperative fluoroscopic images were obtained of the left
foot. These demonstrate surgical fusion of the talocalcaneal joint
as well as the talotarsal and metatarsal joints.
IMPRESSION: Fluoroscopic guidance provided during left foot arthrodesis.

## 2020-06-09 ENCOUNTER — Telehealth: Payer: Self-pay | Admitting: Family Medicine

## 2020-06-09 DIAGNOSIS — G8929 Other chronic pain: Secondary | ICD-10-CM

## 2020-06-09 NOTE — Telephone Encounter (Signed)
Pt needs a refill on traMADol (ULTRAM) 50 MG tablet sent to [facility]

## 2020-06-09 NOTE — Telephone Encounter (Signed)
Rx Refill: Tramadol Last seen on 03/10/2020 by Video Visit Last refilled on 05/08/2020.

## 2020-06-10 DIAGNOSIS — M14672 Charcot's joint, left ankle and foot: Secondary | ICD-10-CM | POA: Diagnosis not present

## 2020-06-10 MED ORDER — TRAMADOL HCL 50 MG PO TABS: 50.0000 mg | ORAL_TABLET | Freq: Three times a day (TID) | ORAL | 0 refills | Status: DC | PRN

## 2020-06-10 NOTE — Addendum Note (Signed)
Addended by: Glori Luis on: 06/10/2020 08:52 AM   Modules accepted: Orders

## 2020-06-10 NOTE — Telephone Encounter (Signed)
Refill sent to pharmacy.  The patient needs a follow-up visit scheduled to get future refills.  Please call him and get him scheduled.  Thanks.

## 2020-06-11 NOTE — Telephone Encounter (Signed)
Called and spoke to [place]. Joseph Singh states that he has an upcoming appointment with Dr.Sonnenberg on 07/01/2020 for surgery clearance.

## 2020-06-16 ENCOUNTER — Other Ambulatory Visit: Payer: Self-pay | Admitting: Family Medicine

## 2020-06-16 DIAGNOSIS — Z794 Long term (current) use of insulin: Secondary | ICD-10-CM | POA: Diagnosis not present

## 2020-06-16 DIAGNOSIS — E1140 Type 2 diabetes mellitus with diabetic neuropathy, unspecified: Secondary | ICD-10-CM | POA: Diagnosis not present

## 2020-06-16 DIAGNOSIS — M14672 Charcot's joint, left ankle and foot: Secondary | ICD-10-CM | POA: Diagnosis not present

## 2020-06-16 DIAGNOSIS — Z89422 Acquired absence of other left toe(s): Secondary | ICD-10-CM | POA: Diagnosis not present

## 2020-06-16 NOTE — Telephone Encounter (Signed)
Patient is scheduled to see provider.  Cortlandt Capuano,cma  

## 2020-06-18 ENCOUNTER — Ambulatory Visit (INDEPENDENT_AMBULATORY_CARE_PROVIDER_SITE_OTHER): Payer: Medicare Other

## 2020-06-18 VITALS — Ht 76.0 in | Wt 271.0 lb

## 2020-06-18 DIAGNOSIS — Z0000 Encounter for general adult medical examination without abnormal findings: Secondary | ICD-10-CM | POA: Diagnosis not present

## 2020-06-18 NOTE — Progress Notes (Signed)
Subjective:   JERRET MCBANE is a 60 y.o. male who presents for Medicare Annual/Subsequent preventive examination.  Review of Systems    No ROS.  Medicare Wellness Virtual Visit.   Cardiac Risk Factors include: advanced age (>55mn, >>58women);male gender;hypertension;diabetes mellitus     Objective:    Today's Vitals   06/18/20 0901  Weight: 271 lb (122.9 kg)  Height: 6' 4" (1.93 m)   Body mass index is 32.99 kg/m.  Advanced Directives 06/18/2020 06/18/2019 02/01/2019 01/29/2019 10/25/2018 10/25/2018 08/08/2018  Does Patient Have a Medical Advance Directive? No No No No - No No  Does patient want to make changes to medical advance directive? - - - - - - -  Would patient like information on creating a medical advance directive? No - Patient declined No - Patient declined No - Patient declined No - Patient declined No - Patient declined - -    Current Medications (verified) Outpatient Encounter Medications as of 06/18/2020  Medication Sig  . amLODipine (NORVASC) 10 MG tablet Take 1 tablet (10 mg total) by mouth daily.  .Marland Kitchenaspirin EC 81 MG tablet Take 1 tablet (81 mg total) by mouth daily.  .Marland Kitchenatorvastatin (LIPITOR) 40 MG tablet TAKE ONE TABLET BY MOUTH EVERY DAY  . blood glucose meter kit and supplies KIT E11.51, check 2x daily, dispense based on patient and insurance preference  . clindamycin (CLEOCIN) 300 MG capsule Take 1 capsule (300 mg total) by mouth 4 (four) times daily. (Patient not taking: Reported on 12/09/2019)  . clopidogrel (PLAVIX) 75 MG tablet TAKE ONE TABLET BY MOUTH EVERY DAY  . empagliflozin (JARDIANCE) 25 MG TABS tablet Take 1 tablet (25 mg total) by mouth daily.  .Marland Kitchengabapentin (NEURONTIN) 300 MG capsule TAKE 1 CAPSULE BY MOUTH FOUR TIMES DAILY  . glipiZIDE (GLUCOTROL XL) 10 MG 24 hr tablet TAKE ONE TABLET BY MOUTH EACH DAY WITH BREAKFAST  . glucose blood (ONE TOUCH ULTRA TEST) test strip Check blood sugar twice daily Dx code: E11.9  . lisinopril-hydrochlorothiazide  (ZESTORETIC) 10-12.5 MG tablet Take 1 tablet by mouth daily.  . metFORMIN (GLUCOPHAGE) 1000 MG tablet TAKE 1 TABLET BY MOUTH TWICE DAILY WITH A MEAL  . metoprolol succinate (TOPROL-XL) 50 MG 24 hr tablet Take 1 tablet (50 mg total) by mouth daily. Take with or immediately following a meal.  . OZEMPIC, 1 MG/DOSE, 4 MG/3ML SOPN INJECT 1MG (0.75ML) SUBCUTANEOUSLY ONCE PER WEEK  . traMADol (ULTRAM) 50 MG tablet Take 1 tablet (50 mg total) by mouth every 8 (eight) hours as needed for moderate pain.  . traMADol (ULTRAM) 50 MG tablet Take 1 tablet (50 mg total) by mouth every 8 (eight) hours as needed for moderate pain.  . traMADol (ULTRAM) 50 MG tablet Take 1 tablet (50 mg total) by mouth every 8 (eight) hours as needed for moderate pain.   No facility-administered encounter medications on file as of 06/18/2020.    Allergies (verified) Patient has no known allergies.   History: Past Medical History:  Diagnosis Date  . Arthritis   . Diabetes mellitus without complication ([place]   . History of kidney stones    h/o  . Hyperlipidemia   . Hypertension   . Neuropathy    Past Surgical History:  Procedure Laterality Date  . AMPUTATION TOE Left 08/10/2018   Procedure: RAY LEFT;  Surgeon: FSamara Deist DPM;  Location: [facility] ORS;  Service: Podiatry;  Laterality: Left;  . ARTHRODESIS METATARSAL Left 02/01/2019   Procedure: ARTHRODESIS METATARSAL;  Surgeon: Fowler, Justin, DPM;  Location: [facility];  Service: Podiatry;  Laterality: Left;  . FOOT ARTHRODESIS Left 02/01/2019   Procedure: ARTHRODESIS FOOT;STJ;  Surgeon: Fowler, Justin, DPM;  Location: [facility];  Service: Podiatry;  Laterality: Left;  . IRRIGATION AND DEBRIDEMENT FOOT Left 10/26/2018   Procedure: IRRIGATION AND DEBRIDEMENT FOOT, LEFT;  Surgeon: Fowler, Justin, DPM;  Location: [facility];  Service: Podiatry;  Laterality: Left;  . LOWER EXTREMITY ANGIOGRAPHY Right 02/25/2016   Procedure: Lower Extremity Angiography;  Surgeon: Jason S Dew, MD;   Location: [facility];  Service: Cardiovascular;  Laterality: Right;  . LOWER EXTREMITY ANGIOGRAPHY Right 02/27/2017   Procedure: LOWER EXTREMITY ANGIOGRAPHY;  Surgeon: Dew, Jason S, MD;  Location: [facility];  Service: Cardiovascular;  Laterality: Right;  . LOWER EXTREMITY ANGIOGRAPHY Left 08/06/2018   Procedure: LOWER EXTREMITY ANGIOGRAPHY;  Surgeon: Dew, Jason S, MD;  Location: [facility];  Service: Cardiovascular;  Laterality: Left;  . LOWER EXTREMITY INTERVENTION  02/25/2016   Procedure: Lower Extremity Intervention;  Surgeon: Jason S Dew, MD;  Location: [facility];  Service: Cardiovascular;;  . TOE AMPUTATION     Family History  Problem Relation Age of Onset  . Diabetes Mother   . Heart disease Father   . Diabetes Sister   . Diabetes Brother   . Cancer Sister        lung  . Cancer Sister        breast   Social History   Socioeconomic History  . Marital status: Married    Spouse name: Not on file  . Number of children: Not on file  . Years of education: Not on file  . Highest education level: Not on file  Occupational History  . Not on file  Tobacco Use  . Smoking status: Former Smoker    Packs/day: 0.50    Years: 35.00    Pack years: 17.50    Types: Cigarettes    Quit date: 08/09/2018    Years since quitting: 1.8  . Smokeless tobacco: Never Used  Vaping Use  . Vaping Use: Never used  Substance and Sexual Activity  . Alcohol use: No    Alcohol/week: 0.0 standard drinks  . Drug use: No  . Sexual activity: Yes    Partners: Female    Comment: Wife  Other Topics Concern  . Not on file  Social History Narrative   Permanently disabled   Fell on the job and hurt his shoulder    Lives with wife and 2 children    1 grandchild from a previous marriage    Pets: Not inside    GED    Right handed    Caffeine- 2-3 sodas, tea, no coffee    Enjoys watching tv and being outside    Social Determinants of Health   Financial Resource  Strain: Low Risk   . Difficulty of Paying Living Expenses: Not hard at all  Food Insecurity: No Food Insecurity  . Worried About Running Out of Food in the Last Year: Never true  . Ran Out of Food in the Last Year: Never true  Transportation Needs: No Transportation Needs  . Lack of Transportation (Medical): No  . Lack of Transportation (Non-Medical): No  Physical Activity: Insufficiently Active  . Days of Exercise per Week: 3 days  . Minutes of Exercise per Session: 20 min  Stress: No Stress Concern Present  . Feeling of Stress : Not at all  Social Connections:   Unknown  . Frequency of Communication with Friends and Family: Not on file  . Frequency of Social Gatherings with Friends and Family: Not on file  . Attends Religious Services: Not on file  . Active Member of Clubs or Organizations: Not on file  . Attends Archivist Meetings: Not on file  . Marital Status: Married    Tobacco Counseling Counseling given: Not Answered   Clinical Intake:  Pre-visit preparation completed: Yes        Diabetes: Yes (Followed by pcp)  How often do you need to have someone help you when you read instructions, pamphlets, or other written materials from your doctor or pharmacy?: 1 - Never  Nutrition Risk Assessment: Has the patient had any N/V/D within the last 2 months?  No  Does the patient have any non-healing wounds?  No  Has the patient had any unintentional weight loss or weight gain?  No   Diabetes: If diabetic, was a CBG obtained today?  No  Did the patient bring in their glucometer from home?  No  How often do you monitor your CBG's? Does not monitor at home.   Financial Strains and Diabetes Management: Are you having any financial strains with the device, your supplies or your medication? No .  Does the patient want to be seen by Chronic Care Management for management of their diabetes?  No  Would the patient like to be referred to a Nutritionist or for Diabetic  Management?  No    Interpreter Needed?: No    Activities of Daily Living In your present state of health, do you have any difficulty performing the following activities: 06/18/2020  Hearing? N  Vision? N  Difficulty concentrating or making decisions? N  Walking or climbing stairs? Y  Dressing or bathing? N  Doing errands, shopping? N  Preparing Food and eating ? N  Using the Toilet? N  In the past six months, have you accidently leaked urine? N  Do you have problems with loss of bowel control? N  Managing your Medications? N  Managing your Finances? N  Housekeeping or managing your Housekeeping? N  Some recent data might be hidden    Patient Care Team: Leone Haven, MD as PCP - General (Family Medicine)  Indicate any recent Medical Services you may have received from other than [facility] providers in the past year (date may be approximate).     Assessment:   This is a routine wellness examination for [facility].  I connected with Nicolo today by telephone and verified that I am speaking with the correct person using two identifiers. Location patient: home Location provider: work Persons participating in the virtual visit: patient, Marine scientist.    I discussed the limitations, risks, security and privacy concerns of performing an evaluation and management service by telephone and the availability of in person appointments. The patient expressed understanding and verbally consented to this telephonic visit.    Interactive audio and video telecommunications were attempted between this provider and patient, however failed, due to patient having technical difficulties OR patient did not have access to video capability.  We continued and completed visit with audio only.  Some vital signs may be absent or patient reported.   Hearing/Vision screen  Hearing Screening   125Hz 250Hz 500Hz 1000Hz 2000Hz 3000Hz 4000Hz 6000Hz 8000Hz  Right ear:           Left ear:           Comments: Patient  is able  to hear conversational tones without difficulty.  No issues reported.  Vision Screening Comments: Reports diabetic eye exam complete with [facility] within the last 12 months.   Dietary issues and exercise activities discussed: Current Exercise Habits: Home exercise routine, Type of exercise: walking, Intensity: Mild  Goals      Patient Stated   .  DIET - INCREASE WATER INTAKE (pt-stated)      Less soda      Depression Screen PHQ 2/9 Scores 06/18/2020 03/10/2020 12/09/2019 09/03/2019 06/18/2019 06/05/2019 02/23/2018  PHQ - 2 Score 0 0 0 0 0 0 0  PHQ- 9 Score - - - - - - -    Fall Risk Fall Risk  06/18/2020 03/10/2020 12/09/2019 09/03/2019 06/18/2019  Falls in the past year? 0 0 0 0 0  Number falls in past yr: 0 0 0 0 -  Injury with Fall? 0 - - - -  Follow up Falls evaluation completed Falls evaluation completed Falls evaluation completed Falls evaluation completed Falls evaluation completed    FALL RISK PREVENTION PERTAINING TO THE HOME: Handrails in use when climbing stairs? Yes Home free of loose throw rugs in walkways, pet beds, electrical cords, etc? Yes  Adequate lighting in your home to reduce risk of falls? Yes   ASSISTIVE DEVICES UTILIZED TO PREVENT FALLS: Life alert? No  Use of a cane, walker or w/c? No  Grab bars in the bathroom? No   Shower chair or bench in shower? Yes   Elevated toilet seat or a handicapped toilet? Yes   TIMED UP AND GO: Was the test performed? No . Virtual visit.   Cognitive Function: MMSE - Mini Mental State Exam 01/19/2017  Orientation to time 5  Orientation to Place 5  Registration 3  Attention/ Calculation 5  Recall 3  Language- name 2 objects 2  Language- repeat 1  Language- follow 3 step command 3  Language- read & follow direction 1  Write a sentence 1  Copy design 1  Total score 30     6CIT Screen 06/18/2020 06/18/2019 01/29/2018  What Year? 0 points 0 points 0 points  What month? 0 points 0 points 0 points  What  time? 0 points 0 points 0 points  Count back from 20 0 points 0 points 0 points  Months in reverse 0 points 0 points 0 points  Repeat phrase 0 points - -  Total Score 0 - -    Immunizations Immunization History  Administered Date(s) Administered  . Influenza,inj,Quad PF,6+ Mos 12/11/2017, 10/22/2018    Health Maintenance Health Maintenance  Topic Date Due  . OPHTHALMOLOGY EXAM  05/13/2020  . FOOT EXAM  06/04/2020  . HEMOGLOBIN A1C  06/08/2020  . COLONOSCOPY (Pts 45-49yrs Insurance coverage will need to be confirmed)  06/18/2021 (Originally 02/13/2019)  . INFLUENZA VACCINE  09/21/2020  . TETANUS/TDAP  02/23/2025  . PNEUMOCOCCAL POLYSACCHARIDE VACCINE AGE 2-64 HIGH RISK  Completed  . Hepatitis C Screening  Completed  . HIV Screening  Completed  . HPV VACCINES  Aged Out  . COVID-19 Vaccine  Discontinued   Colorectal cancer screening: Type of screening: Colonoscopy. Completed 02/12/09. Repeat every 10 years Deferred per patient.   Vision Screening: Recommended annual ophthalmology exams for early detection of glaucoma and other disorders of the eye. Is the patient up to date with their annual eye exam?  No  Who is the provider or what is the name of the office in which the patient attends annual eye exams? [facility]. Patient plans to schedule.   Dental Screening: Recommended annual dental exams for proper oral hygiene.  Community Resource Referral / Chronic Care Management: CRR required this visit?  No   CCM required this visit?  No      Plan:   Keep all routine maintenance appointments.   Follow up 07/01/20 @ 4:00  I have personally reviewed and noted the following in the patient's chart:   . Medical and social history . Use of alcohol, tobacco or illicit drugs  . Current medications and supplements . Functional ability and status . Nutritional status . Physical activity . Advanced directives . List of other physicians . Hospitalizations,  surgeries, and ER visits in previous 12 months . Vitals . Screenings to include cognitive, depression, and falls . Referrals and appointments  In addition, I have reviewed and discussed with patient certain preventive protocols, quality metrics, and best practice recommendations. A written personalized care plan for preventive services as well as general preventive health recommendations were provided to patient via mychart.     Varney Biles, LPN   04/30/4074

## 2020-06-18 NOTE — Patient Instructions (Addendum)
Mr. Joseph Singh , Thank you for taking time to come for your Medicare Wellness Visit. I appreciate your ongoing commitment to your health goals. Please review the following plan we discussed and let me know if I can assist you in the future.   These are the goals we discussed: Goals      Patient Stated   .  DIET - INCREASE WATER INTAKE (pt-stated)      Less soda       This is a list of the screening recommended for you and due dates:  Health Maintenance  Topic Date Due  . Eye exam for diabetics  05/13/2020  . Complete foot exam   06/04/2020  . Hemoglobin A1C  06/08/2020  . Colon Cancer Screening  06/18/2021*  . Flu Shot  09/21/2020  . Tetanus Vaccine  02/23/2025  . Pneumococcal vaccine  Completed  .  Hepatitis C: One time screening is recommended by Center for Disease Control  (CDC) for  adults born from [birth year] through 1967.   Completed  . HIV Screening  Completed  . HPV Vaccine  Aged Out  . COVID-19 Vaccine  Discontinued  *Topic was postponed. The date shown is not the original due date.    Immunizations Immunization History  Administered Date(s) Administered  . Influenza,inj,Quad PF,6+ Mos 12/11/2017, 10/22/2018    Keep all routine maintenance appointments.   Follow up 07/01/20 @ 4:00  Advanced directives: not yet completed  Conditions/risks identified: none new  Follow up in one year for your annual wellness visit   Preventive Care 40-64 Years, Male Preventive care refers to lifestyle choices and visits with your health care provider that can promote health and wellness. What does preventive care include?  A yearly physical exam. This is also called an annual well check.  Dental exams once or twice a year.  Routine eye exams. Ask your health care provider how often you should have your eyes checked.  Personal lifestyle choices, including:  Daily care of your teeth and gums.  Regular physical activity.  Eating a healthy diet.  Avoiding tobacco and drug  use.  Limiting alcohol use.  Practicing safe sex.  Taking low-dose aspirin every day starting at age 12. What happens during an annual well check? The services and screenings done by your health care provider during your annual well check will depend on your age, overall health, lifestyle risk factors, and family history of disease. Counseling  Your health care provider may ask you questions about your:  Alcohol use.  Tobacco use.  Drug use.  Emotional well-being.  Home and relationship well-being.  Sexual activity.  Eating habits.  Work and work Astronomer. Screening  You may have the following tests or measurements:  Height, weight, and BMI.  Blood pressure.  Lipid and cholesterol levels. These may be checked every 5 years, or more frequently if you are over 27 years old.  Skin check.  Lung cancer screening. You may have this screening every year starting at age 79 if you have a 30-pack-year history of smoking and currently smoke or have quit within the past 15 years.  Fecal occult blood test (FOBT) of the stool. You may have this test every year starting at age 52.  Flexible sigmoidoscopy or colonoscopy. You may have a sigmoidoscopy every 5 years or a colonoscopy every 10 years starting at age 70.  Prostate cancer screening. Recommendations will vary depending on your family history and other risks.  Hepatitis C blood test.  Hepatitis B blood  test.  Sexually transmitted disease (STD) testing.  Diabetes screening. This is done by checking your blood sugar (glucose) after you have not eaten for a while (fasting). You may have this done every 1-3 years. Discuss your test results, treatment options, and if necessary, the need for more tests with your health care provider. Vaccines  Your health care provider may recommend certain vaccines, such as:  Influenza vaccine. This is recommended every year.  Tetanus, diphtheria, and acellular pertussis (Tdap, Td)  vaccine. You may need a Td booster every 10 years.  Zoster vaccine. You may need this after age 81.  Pneumococcal 13-valent conjugate (PCV13) vaccine. You may need this if you have certain conditions and have not been vaccinated.  Pneumococcal polysaccharide (PPSV23) vaccine. You may need one or two doses if you smoke cigarettes or if you have certain conditions. Talk to your health care provider about which screenings and vaccines you need and how often you need them. This information is not intended to replace advice given to you by your health care provider. Make sure you discuss any questions you have with your health care provider. Document Released: 03/06/2015 Document Revised: 10/28/2015 Document Reviewed: 12/09/2014 Elsevier Interactive Patient Education  2017 [employer].  Fall Prevention in the Home Falls can cause injuries. They can happen to people of all ages. There are many things you can do to make your home safe and to help prevent falls. What can I do on the outside of my home?  Regularly fix the edges of walkways and driveways and fix any cracks.  Remove anything that might make you trip as you walk through a door, such as a raised step or threshold.  Trim any bushes or trees on the path to your home.  Use bright outdoor lighting.  Clear any walking paths of anything that might make someone trip, such as rocks or tools.  Regularly check to see if handrails are loose or broken. Make sure that both sides of any steps have handrails.  Any raised decks and porches should have guardrails on the edges.  Have any leaves, snow, or ice cleared regularly.  Use sand or salt on walking paths during winter.  Clean up any spills in your garage right away. This includes oil or grease spills. What can I do in the bathroom?  Use night lights.  Install grab bars by the toilet and in the tub and shower. Do not use towel bars as grab bars.  Use non-skid mats or decals in the  tub or shower.  If you need to sit down in the shower, use a plastic, non-slip stool.  Keep the floor dry. Clean up any water that spills on the floor as soon as it happens.  Remove soap buildup in the tub or shower regularly.  Attach bath mats securely with double-sided non-slip rug tape.  Do not have throw rugs and other things on the floor that can make you trip. What can I do in the bedroom?  Use night lights.  Make sure that you have a light by your bed that is easy to reach.  Do not use any sheets or blankets that are too big for your bed. They should not hang down onto the floor.  Have a firm chair that has side arms. You can use this for support while you get dressed.  Do not have throw rugs and other things on the floor that can make you trip. What can I do in the kitchen?  Clean up any spills right away.  Avoid walking on wet floors.  Keep items that you use a lot in easy-to-reach places.  If you need to reach something above you, use a strong step stool that has a grab bar.  Keep electrical cords out of the way.  Do not use floor polish or wax that makes floors slippery. If you must use wax, use non-skid floor wax.  Do not have throw rugs and other things on the floor that can make you trip. What can I do with my stairs?  Do not leave any items on the stairs.  Make sure that there are handrails on both sides of the stairs and use them. Fix handrails that are broken or loose. Make sure that handrails are as long as the stairways.  Check any carpeting to make sure that it is firmly attached to the stairs. Fix any carpet that is loose or worn.  Avoid having throw rugs at the top or bottom of the stairs. If you do have throw rugs, attach them to the floor with carpet tape.  Make sure that you have a light switch at the top of the stairs and the bottom of the stairs. If you do not have them, ask someone to add them for you. What else can I do to help prevent  falls?  Wear shoes that:  Do not have high heels.  Have rubber bottoms.  Are comfortable and fit you well.  Are closed at the toe. Do not wear sandals.  If you use a stepladder:  Make sure that it is fully opened. Do not climb a closed stepladder.  Make sure that both sides of the stepladder are locked into place.  Ask someone to hold it for you, if possible.  Clearly mark and make sure that you can see:  Any grab bars or handrails.  First and last steps.  Where the edge of each step is.  Use tools that help you move around (mobility aids) if they are needed. These include:  Canes.  Walkers.  Scooters.  Crutches.  Turn on the lights when you go into a dark area. Replace any light bulbs as soon as they burn out.  Set up your furniture so you have a clear path. Avoid moving your furniture around.  If any of your floors are uneven, fix them.  If there are any pets around you, be aware of where they are.  Review your medicines with your doctor. Some medicines can make you feel dizzy. This can increase your chance of falling. Ask your doctor what other things that you can do to help prevent falls. This information is not intended to replace advice given to you by your health care provider. Make sure you discuss any questions you have with your health care provider. Document Released: 12/04/2008 Document Revised: 07/16/2015 Document Reviewed: 03/14/2014 Elsevier Interactive Patient Education  2017 [employer].

## 2020-06-25 ENCOUNTER — Other Ambulatory Visit: Payer: Self-pay

## 2020-07-01 ENCOUNTER — Other Ambulatory Visit: Payer: Self-pay

## 2020-07-01 ENCOUNTER — Encounter: Payer: Self-pay | Admitting: Family Medicine

## 2020-07-01 ENCOUNTER — Ambulatory Visit (INDEPENDENT_AMBULATORY_CARE_PROVIDER_SITE_OTHER): Payer: Medicare Other | Admitting: Family Medicine

## 2020-07-01 VITALS — BP 115/60 | HR 77 | Temp 98.1°F | Ht 76.0 in | Wt 289.8 lb

## 2020-07-01 DIAGNOSIS — E119 Type 2 diabetes mellitus without complications: Secondary | ICD-10-CM | POA: Diagnosis not present

## 2020-07-01 DIAGNOSIS — Z01818 Encounter for other preprocedural examination: Secondary | ICD-10-CM | POA: Insufficient documentation

## 2020-07-01 NOTE — Progress Notes (Signed)
[name] Rumps, MD Phone: [phone]  Joseph Singh is a 60 y.o. male who presents today for preop clearance..  Pt is a 60 y.o. male who is here for preoperative clearance for left foot hardware removal, tibial calcaneal arthrodesis, intramedullary rod external fixator application.  1) High Risk Cardiac Conditions  1) Recent MI - No.  2) Decompensated Heart Failure - No.  3) unstable angina - No.  4) Symptomatic arrythmia - No.  5) Sx Valvular Disease - No.  2) Intermediate Risk Factors - DM - Yes.    2) Functional Status - > 4 mets ( climb stairs ) Yes.    3) Surgery Specific Risk - Intermediate (Orthopaedic )      4) Further Noninvasive evaluation -   1) EKG - No. Not indicated.  2) Echo - No. Not indicated  3) Stress Testing - Active Cardiac Disease - No. Not indicated.  5) Need for medical therapy - Beta Blocker, Statins indicated ? No.   Patient denies tobacco, alcohol, and other drug use. The patient notes no history of kidney disease, heart attack, irregular heartbeat, stroke, issues with anesthesia, history of seizures, history of thyroid disease, history of liver disease, history of heart failure, or history of asthma or bronchitis.  He is diabetic and takes Jardiance, glipizide, and metformin.  Social History   Tobacco Use  Smoking Status Former Smoker  . Packs/day: 0.50  . Years: 35.00  . Pack years: 17.50  . Types: Cigarettes  . Quit date: 08/09/2018  . Years since quitting: 1.8  Smokeless Tobacco Never Used    Current Outpatient Medications on File Prior to Visit  Medication Sig Dispense Refill  . amLODipine (NORVASC) 10 MG tablet Take 1 tablet (10 mg total) by mouth daily. 90 tablet 3  . aspirin EC 81 MG tablet Take 1 tablet (81 mg total) by mouth daily. 150 tablet 2  . atorvastatin (LIPITOR) 40 MG tablet TAKE ONE TABLET BY MOUTH EVERY DAY 90 tablet 3  . blood glucose meter kit and supplies KIT E11.51, check 2x daily, dispense based on patient and  insurance preference 1 each 0  . budesonide (PULMICORT) 0.5 MG/2ML nebulizer solution Take by nebulization.    . clopidogrel (PLAVIX) 75 MG tablet TAKE ONE TABLET BY MOUTH EVERY DAY 90 tablet 3  . empagliflozin (JARDIANCE) 25 MG TABS tablet Take 1 tablet (25 mg total) by mouth daily. 90 tablet 3  . gabapentin (NEURONTIN) 300 MG capsule TAKE 1 CAPSULE BY MOUTH FOUR TIMES DAILY 360 capsule 2  . glipiZIDE (GLUCOTROL XL) 10 MG 24 hr tablet TAKE ONE TABLET BY MOUTH EACH DAY WITH BREAKFAST 90 tablet 3  . glucose blood (ONE TOUCH ULTRA TEST) test strip Check blood sugar twice daily Dx code: E11.9 200 each 0  . lisinopril-hydrochlorothiazide (ZESTORETIC) 10-12.5 MG tablet Take 1 tablet by mouth daily. 90 tablet 3  . metFORMIN (GLUCOPHAGE) 1000 MG tablet TAKE 1 TABLET BY MOUTH TWICE DAILY WITH A MEAL 90 tablet 1  . metoprolol succinate (TOPROL-XL) 50 MG 24 hr tablet Take 1 tablet (50 mg total) by mouth daily. Take with or immediately following a meal. 90 tablet 1  . OZEMPIC, 1 MG/DOSE, 4 MG/3ML SOPN INJECT $RemoveBef'1MG'EnDTrHwLwE$  (0.75ML) SUBCUTANEOUSLY ONCE PER WEEK 6 mL 0  . traMADol (ULTRAM) 50 MG tablet Take 1 tablet (50 mg total) by mouth every 8 (eight) hours as needed for moderate pain. 90 tablet 0  . traMADol (ULTRAM) 50 MG tablet Take 1 tablet (50 mg total)  by mouth every 8 (eight) hours as needed for moderate pain. 90 tablet 0  . traMADol (ULTRAM) 50 MG tablet Take 1 tablet (50 mg total) by mouth every 8 (eight) hours as needed for moderate pain. 90 tablet 0  . clindamycin (CLEOCIN) 300 MG capsule Take 1 capsule (300 mg total) by mouth 4 (four) times daily. (Patient not taking: Reported on 12/09/2019) 40 capsule 0  . predniSONE (DELTASONE) 20 MG tablet FOLLOW DIRECTIONS ON DOSING CALENDAR     No current facility-administered medications on file prior to visit.     ROS see history of present illness  Objective  Physical Exam Vitals:   07/01/20 1559  BP: 115/60  Pulse: 77  Temp: 98.1 F (36.7 C)  SpO2:  94%    BP Readings from Last 3 Encounters:  07/01/20 115/60  12/09/19 120/80  09/03/19 130/70   Wt Readings from Last 3 Encounters:  07/01/20 289 lb 12.8 oz (131.5 kg)  06/18/20 271 lb (122.9 kg)  03/10/20 271 lb (122.9 kg)    Physical Exam Constitutional:      General: He is not in acute distress.    Appearance: He is not diaphoretic.  Cardiovascular:     Rate and Rhythm: Normal rate and regular rhythm.     Heart sounds: Normal heart sounds.  Pulmonary:     Effort: Pulmonary effort is normal.     Breath sounds: Normal breath sounds.  Abdominal:     General: Bowel sounds are normal. There is no distension.     Palpations: Abdomen is soft.     Tenderness: There is no abdominal tenderness. There is no guarding.  Musculoskeletal:     Right lower leg: No edema.     Left lower leg: No edema.  Lymphadenopathy:     Cervical: No cervical adenopathy.  Skin:    General: Skin is warm and dry.  Neurological:     Mental Status: He is alert.      Assessment/Plan: Please see individual problem list.  Problem List Items Addressed This Visit    Type 2 diabetes mellitus without complication, without long-term current use of insulin [name]   Relevant Orders   HgB A1c   Preop examination - Primary    Preoperative exam completed today.  The patient has no issues with chest pain or shortness of breath.  He has no history of heart failure or MI.  His Lyndel Safe perioperative risk score is 0.1% placing him at low risk for a cardiovascular event.  We will obtain lab work as outlined.  If his labs are acceptable then we would proceed with clearance.      Relevant Orders   Comp Met (CMET)   CBC     Return in about 3 months (around 10/01/2020).  This visit occurred during the SARS-CoV-2 public health emergency.  Safety protocols were in place, including screening questions prior to the visit, additional usage of staff PPE, and extensive cleaning of exam room while observing appropriate contact  time as indicated for disinfecting solutions.    [name] Rumps, MD [place]

## 2020-07-01 NOTE — Assessment & Plan Note (Signed)
Preoperative exam completed today.  The patient has no issues with chest pain or shortness of breath.  He has no history of heart failure or MI.  His Chales Abrahams perioperative risk score is 0.1% placing him at low risk for a cardiovascular event.  We will obtain lab work as outlined.  If his labs are acceptable then we would proceed with clearance.

## 2020-07-01 NOTE — Patient Instructions (Signed)
Nice to see you. We will get labs today. 

## 2020-07-02 ENCOUNTER — Telehealth: Payer: Self-pay | Admitting: Family Medicine

## 2020-07-02 LAB — CBC
HCT: 41.4 % (ref 39.0–52.0)
Hemoglobin: 14 g/dL (ref 13.0–17.0)
MCHC: 33.8 g/dL (ref 30.0–36.0)
MCV: 89.8 fl (ref 78.0–100.0)
Platelets: 307 10*3/uL (ref 150.0–400.0)
RBC: 4.62 Mil/uL (ref 4.22–5.81)
RDW: 13.5 % (ref 11.5–15.5)
WBC: 7.3 10*3/uL (ref 4.0–10.5)

## 2020-07-02 LAB — COMPREHENSIVE METABOLIC PANEL
ALT: 12 U/L (ref 0–53)
AST: 12 U/L (ref 0–37)
Albumin: 3.6 g/dL (ref 3.5–5.2)
Alkaline Phosphatase: 111 U/L (ref 39–117)
BUN: 15 mg/dL (ref 6–23)
CO2: 30 mEq/L (ref 19–32)
Calcium: 9.4 mg/dL (ref 8.4–10.5)
Chloride: 101 mEq/L (ref 96–112)
Creatinine, Ser: 0.97 mg/dL (ref 0.40–1.50)
GFR: 84.33 mL/min (ref >60.00)
Glucose, Bld: 190 mg/dL — ABNORMAL HIGH (ref 70–99)
Potassium: 4 mEq/L (ref 3.5–5.1)
Sodium: 139 mEq/L (ref 135–145)
Total Bilirubin: 0.6 mg/dL (ref 0.2–1.2)
Total Protein: 6.5 g/dL (ref 6.0–8.3)

## 2020-07-02 LAB — HEMOGLOBIN A1C: Hgb A1c MFr Bld: 6.7 % — ABNORMAL HIGH (ref 4.6–6.5)

## 2020-07-02 NOTE — Telephone Encounter (Signed)
PT came into office wanting to pick up paperwork that was suppose to be filled out for surgery. PT would like a call when it is ready

## 2020-07-02 NOTE — Telephone Encounter (Signed)
PT came into office wanting to pick up paperwork that was suppose to be filled out for surgery. PT would like a call when it is ready.   Amoura Ransier,cma

## 2020-07-03 ENCOUNTER — Telehealth: Payer: Self-pay

## 2020-07-03 ENCOUNTER — Telehealth: Payer: Self-pay | Admitting: Family Medicine

## 2020-07-03 NOTE — Telephone Encounter (Signed)
I called the patient and spoke with his wife and informed her that the form for medical clearance was available at the front for pick up but I did fax the original and the medication list  to the provider Dr. Bennett Scrape and I sent a copy of the form to scan. She understood and will pick up.  Audreyana Huntsberry,cma

## 2020-07-03 NOTE — Telephone Encounter (Signed)
Form completed. Please attach his medication list then fax.  Please make a copy and make the original available to the patient to pick up.

## 2020-07-03 NOTE — Telephone Encounter (Signed)
PT called in again in regarding his paperwork that he is needing filled out for his surgery. PT is wanting to know if it is ready.

## 2020-07-03 NOTE — Telephone Encounter (Signed)
.  triageb

## 2020-07-07 ENCOUNTER — Other Ambulatory Visit: Payer: Self-pay | Admitting: Family Medicine

## 2020-07-07 DIAGNOSIS — M5442 Lumbago with sciatica, left side: Secondary | ICD-10-CM

## 2020-07-07 DIAGNOSIS — G8929 Other chronic pain: Secondary | ICD-10-CM

## 2020-07-07 NOTE — Telephone Encounter (Signed)
RX Refill:tramadol Last Seen:07-01-20 Last ordered:06-10-20

## 2020-07-10 DIAGNOSIS — E119 Type 2 diabetes mellitus without complications: Secondary | ICD-10-CM | POA: Diagnosis not present

## 2020-07-10 DIAGNOSIS — I10 Essential (primary) hypertension: Secondary | ICD-10-CM | POA: Diagnosis not present

## 2020-07-10 DIAGNOSIS — Z8616 Personal history of COVID-19: Secondary | ICD-10-CM | POA: Diagnosis not present

## 2020-07-10 DIAGNOSIS — Z89422 Acquired absence of other left toe(s): Secondary | ICD-10-CM | POA: Diagnosis not present

## 2020-07-10 DIAGNOSIS — S98132A Complete traumatic amputation of one left lesser toe, initial encounter: Secondary | ICD-10-CM | POA: Diagnosis not present

## 2020-07-10 DIAGNOSIS — Z472 Encounter for removal of internal fixation device: Secondary | ICD-10-CM | POA: Diagnosis not present

## 2020-07-10 DIAGNOSIS — E785 Hyperlipidemia, unspecified: Secondary | ICD-10-CM | POA: Diagnosis not present

## 2020-07-10 DIAGNOSIS — E1151 Type 2 diabetes mellitus with diabetic peripheral angiopathy without gangrene: Secondary | ICD-10-CM | POA: Diagnosis not present

## 2020-07-10 DIAGNOSIS — Z87891 Personal history of nicotine dependence: Secondary | ICD-10-CM | POA: Diagnosis not present

## 2020-07-10 DIAGNOSIS — Z7902 Long term (current) use of antithrombotics/antiplatelets: Secondary | ICD-10-CM | POA: Diagnosis not present

## 2020-07-10 DIAGNOSIS — Z7982 Long term (current) use of aspirin: Secondary | ICD-10-CM | POA: Diagnosis not present

## 2020-07-10 DIAGNOSIS — Z95820 Peripheral vascular angioplasty status with implants and grafts: Secondary | ICD-10-CM | POA: Diagnosis not present

## 2020-07-10 DIAGNOSIS — Z79899 Other long term (current) drug therapy: Secondary | ICD-10-CM | POA: Diagnosis not present

## 2020-07-10 DIAGNOSIS — E1140 Type 2 diabetes mellitus with diabetic neuropathy, unspecified: Secondary | ICD-10-CM | POA: Diagnosis not present

## 2020-07-10 DIAGNOSIS — I739 Peripheral vascular disease, unspecified: Secondary | ICD-10-CM | POA: Diagnosis not present

## 2020-07-10 DIAGNOSIS — Z7984 Long term (current) use of oral hypoglycemic drugs: Secondary | ICD-10-CM | POA: Diagnosis not present

## 2020-07-10 DIAGNOSIS — G8918 Other acute postprocedural pain: Secondary | ICD-10-CM | POA: Diagnosis not present

## 2020-07-10 DIAGNOSIS — E11610 Type 2 diabetes mellitus with diabetic neuropathic arthropathy: Secondary | ICD-10-CM | POA: Diagnosis not present

## 2020-07-10 DIAGNOSIS — M14672 Charcot's joint, left ankle and foot: Secondary | ICD-10-CM | POA: Diagnosis not present

## 2020-07-11 DIAGNOSIS — I10 Essential (primary) hypertension: Secondary | ICD-10-CM | POA: Diagnosis not present

## 2020-07-11 DIAGNOSIS — Z95820 Peripheral vascular angioplasty status with implants and grafts: Secondary | ICD-10-CM | POA: Diagnosis not present

## 2020-07-11 DIAGNOSIS — E1140 Type 2 diabetes mellitus with diabetic neuropathy, unspecified: Secondary | ICD-10-CM | POA: Diagnosis not present

## 2020-07-11 DIAGNOSIS — Z79899 Other long term (current) drug therapy: Secondary | ICD-10-CM | POA: Diagnosis not present

## 2020-07-11 DIAGNOSIS — Z87891 Personal history of nicotine dependence: Secondary | ICD-10-CM | POA: Diagnosis not present

## 2020-07-11 DIAGNOSIS — Z8616 Personal history of COVID-19: Secondary | ICD-10-CM | POA: Diagnosis not present

## 2020-07-11 DIAGNOSIS — E11610 Type 2 diabetes mellitus with diabetic neuropathic arthropathy: Secondary | ICD-10-CM | POA: Diagnosis not present

## 2020-07-11 DIAGNOSIS — E1151 Type 2 diabetes mellitus with diabetic peripheral angiopathy without gangrene: Secondary | ICD-10-CM | POA: Diagnosis not present

## 2020-07-11 DIAGNOSIS — Z472 Encounter for removal of internal fixation device: Secondary | ICD-10-CM | POA: Diagnosis not present

## 2020-07-11 DIAGNOSIS — Z7984 Long term (current) use of oral hypoglycemic drugs: Secondary | ICD-10-CM | POA: Diagnosis not present

## 2020-07-11 DIAGNOSIS — Z89422 Acquired absence of other left toe(s): Secondary | ICD-10-CM | POA: Diagnosis not present

## 2020-07-11 DIAGNOSIS — E785 Hyperlipidemia, unspecified: Secondary | ICD-10-CM | POA: Diagnosis not present

## 2020-07-11 DIAGNOSIS — Z7982 Long term (current) use of aspirin: Secondary | ICD-10-CM | POA: Diagnosis not present

## 2020-07-11 DIAGNOSIS — Z7902 Long term (current) use of antithrombotics/antiplatelets: Secondary | ICD-10-CM | POA: Diagnosis not present

## 2020-07-14 ENCOUNTER — Telehealth: Payer: Self-pay

## 2020-07-14 DIAGNOSIS — S98132A Complete traumatic amputation of one left lesser toe, initial encounter: Secondary | ICD-10-CM | POA: Diagnosis not present

## 2020-07-14 NOTE — Telephone Encounter (Signed)
Transition Care Management Unsuccessful Follow-up Telephone Call  Date of discharge and from where:  07/11/20 from [facility]  Attempts:  1st Attempt  Reason for unsuccessful TCM follow-up call:  Unable to reach patient

## 2020-07-15 NOTE — Telephone Encounter (Signed)
Transition Care Management Follow-up Telephone Call Patient to follow up with Surgeon next week. Notes he is recovering well and no follow up with pcp needed at this time. Agrees to call the office if symptoms worsen and as needed.

## 2020-07-22 DIAGNOSIS — M14672 Charcot's joint, left ankle and foot: Secondary | ICD-10-CM | POA: Diagnosis not present

## 2020-08-05 DIAGNOSIS — Z4789 Encounter for other orthopedic aftercare: Secondary | ICD-10-CM | POA: Diagnosis not present

## 2020-08-06 ENCOUNTER — Other Ambulatory Visit: Payer: Self-pay | Admitting: Family Medicine

## 2020-08-06 DIAGNOSIS — E11621 Type 2 diabetes mellitus with foot ulcer: Secondary | ICD-10-CM | POA: Diagnosis not present

## 2020-08-06 NOTE — Telephone Encounter (Signed)
RX Refill:tramadol Last Seen:07-01-20 Last ordered:07-07-20

## 2020-08-07 ENCOUNTER — Other Ambulatory Visit: Payer: Self-pay | Admitting: Family Medicine

## 2020-08-20 ENCOUNTER — Other Ambulatory Visit: Payer: Self-pay | Admitting: Family Medicine

## 2020-08-20 DIAGNOSIS — I10 Essential (primary) hypertension: Secondary | ICD-10-CM

## 2020-08-21 DIAGNOSIS — S98132A Complete traumatic amputation of one left lesser toe, initial encounter: Secondary | ICD-10-CM | POA: Diagnosis not present

## 2020-08-22 ENCOUNTER — Other Ambulatory Visit: Payer: Self-pay | Admitting: Family Medicine

## 2020-08-22 DIAGNOSIS — I10 Essential (primary) hypertension: Secondary | ICD-10-CM

## 2020-08-25 DIAGNOSIS — Z4789 Encounter for other orthopedic aftercare: Secondary | ICD-10-CM | POA: Diagnosis not present

## 2020-08-25 DIAGNOSIS — M14672 Charcot's joint, left ankle and foot: Secondary | ICD-10-CM | POA: Diagnosis not present

## 2020-09-07 ENCOUNTER — Other Ambulatory Visit: Payer: Self-pay | Admitting: Family Medicine

## 2020-09-07 NOTE — Telephone Encounter (Signed)
RX Refill:tramadol Last Seen:07-01-20 Last ordered: 08-07-20

## 2020-09-10 DIAGNOSIS — E1140 Type 2 diabetes mellitus with diabetic neuropathy, unspecified: Secondary | ICD-10-CM | POA: Diagnosis not present

## 2020-09-10 DIAGNOSIS — B351 Tinea unguium: Secondary | ICD-10-CM | POA: Diagnosis not present

## 2020-09-10 DIAGNOSIS — M14672 Charcot's joint, left ankle and foot: Secondary | ICD-10-CM | POA: Diagnosis not present

## 2020-09-10 DIAGNOSIS — Z89422 Acquired absence of other left toe(s): Secondary | ICD-10-CM | POA: Diagnosis not present

## 2020-09-24 DIAGNOSIS — M14672 Charcot's joint, left ankle and foot: Secondary | ICD-10-CM | POA: Diagnosis not present

## 2020-09-28 ENCOUNTER — Other Ambulatory Visit: Payer: Self-pay | Admitting: Family Medicine

## 2020-09-28 DIAGNOSIS — I10 Essential (primary) hypertension: Secondary | ICD-10-CM

## 2020-10-02 ENCOUNTER — Ambulatory Visit (INDEPENDENT_AMBULATORY_CARE_PROVIDER_SITE_OTHER): Payer: Medicare Other | Admitting: Family Medicine

## 2020-10-02 ENCOUNTER — Other Ambulatory Visit: Payer: Self-pay

## 2020-10-02 VITALS — BP 126/72 | HR 93 | Temp 97.8°F | Resp 16 | Ht 76.0 in | Wt 298.0 lb

## 2020-10-02 DIAGNOSIS — Z89432 Acquired absence of left foot: Secondary | ICD-10-CM

## 2020-10-02 DIAGNOSIS — E785 Hyperlipidemia, unspecified: Secondary | ICD-10-CM

## 2020-10-02 DIAGNOSIS — E1142 Type 2 diabetes mellitus with diabetic polyneuropathy: Secondary | ICD-10-CM | POA: Diagnosis not present

## 2020-10-02 DIAGNOSIS — E119 Type 2 diabetes mellitus without complications: Secondary | ICD-10-CM

## 2020-10-02 DIAGNOSIS — I10 Essential (primary) hypertension: Secondary | ICD-10-CM | POA: Diagnosis not present

## 2020-10-02 LAB — LIPID PANEL
Cholesterol: 152 mg/dL (ref 0–200)
HDL: 39 mg/dL — ABNORMAL LOW (ref >39.00)
LDL Cholesterol: 76 mg/dL (ref 0–99)
NonHDL: 112.85
Total CHOL/HDL Ratio: 4
Triglycerides: 184 mg/dL — ABNORMAL HIGH (ref 0.0–149.0)
VLDL: 36.8 mg/dL (ref 0.0–40.0)

## 2020-10-02 LAB — HEPATIC FUNCTION PANEL
ALT: 23 U/L (ref 0–53)
AST: 15 U/L (ref 0–37)
Albumin: 3.9 g/dL (ref 3.5–5.2)
Alkaline Phosphatase: 111 U/L (ref 39–117)
Bilirubin, Direct: 0.1 mg/dL (ref 0.0–0.3)
Total Bilirubin: 0.6 mg/dL (ref 0.2–1.2)
Total Protein: 6.9 g/dL (ref 6.0–8.3)

## 2020-10-02 LAB — HEMOGLOBIN A1C: Hgb A1c MFr Bld: 6.4 % (ref 4.6–6.5)

## 2020-10-02 MED ORDER — TRAMADOL HCL 50 MG PO TABS: ORAL_TABLET | ORAL | 0 refills | Status: DC

## 2020-10-02 NOTE — Assessment & Plan Note (Signed)
Well-controlled.  Continue amlodipine 10 mg once daily, lisinopril-HCTZ 1 tablet daily, and metoprolol 50 mg daily.

## 2020-10-02 NOTE — Assessment & Plan Note (Signed)
Continue Lipitor 40 mg once daily.  Check lipid panel and hepatic function panel.

## 2020-10-02 NOTE — Assessment & Plan Note (Signed)
Doing well after recent surgery.  He will continue to see a specialist.

## 2020-10-02 NOTE — Progress Notes (Signed)
[name] Rumps, MD Phone: [phone]  Joseph Singh is a 60 y.o. male who presents today for f/u.  HYPERTENSION Disease Monitoring Home BP Monitoring typically better than today Chest pain- no    Dyspnea- no Medications Compliance-  taking amlodipine, lisinopril, HCTZ, metoprolol.  Edema- no  DIABETES Disease Monitoring: Blood Sugar ranges-not checking Polyuria/phagia/dipsia- no      Optho- see's next week Medications: Compliance- jardiance, glipizide Hypoglycemic symptoms- no  Neuropathy: Patient notes this is well controlled on the tramadol 3 times a day and his gabapentin.  He does have a little bit of drowsiness occasionally with the gabapentin though otherwise no drowsiness with these medications.  Charcot joint left foot: Patient has been seeing podiatry at [facility].  He had a surgery several months ago and is still in the external fixator.  He has been taking calcitonin.  Generally is doing quite well.  Social History   Tobacco Use  Smoking Status Former   Packs/day: 0.50   Years: 35.00   Pack years: 17.50   Types: Cigarettes   Quit date: 08/09/2018   Years since quitting: 2.1  Smokeless Tobacco Never    Current Outpatient Medications on File Prior to Visit  Medication Sig Dispense Refill   amLODipine (NORVASC) 10 MG tablet TAKE 1 TABLET BY MOUTH DAILY 90 tablet 3   aspirin EC 81 MG tablet Take 1 tablet (81 mg total) by mouth daily. 150 tablet 2   atorvastatin (LIPITOR) 40 MG tablet TAKE ONE TABLET BY MOUTH EVERY DAY 90 tablet 3   blood glucose meter kit and supplies KIT E11.51, check 2x daily, dispense based on patient and insurance preference 1 each 0   budesonide (PULMICORT) 0.5 MG/2ML nebulizer solution Take by nebulization.     calcitonin, salmon, (MIACALCIN/FORTICAL) 200 UNIT/ACT nasal spray 1 spray daily.     clopidogrel (PLAVIX) 75 MG tablet TAKE ONE TABLET BY MOUTH EVERY DAY 90 tablet 3   empagliflozin (JARDIANCE) 25 MG TABS tablet Take 1  tablet (25 mg total) by mouth daily. 90 tablet 3   gabapentin (NEURONTIN) 300 MG capsule TAKE 1 CAPSULE BY MOUTH FOUR TIMES DAILY 360 capsule 2   glipiZIDE (GLUCOTROL XL) 10 MG 24 hr tablet TAKE ONE TABLET BY MOUTH EACH DAY WITH BREAKFAST 90 tablet 3   glucose blood (ONE TOUCH ULTRA TEST) test strip Check blood sugar twice daily Dx code: E11.9 200 each 0   lisinopril-hydrochlorothiazide (ZESTORETIC) 10-12.5 MG tablet TAKE 1 TABLET BY MOUTH DAILY 90 tablet 3   metFORMIN (GLUCOPHAGE) 1000 MG tablet TAKE 1 TABLET BY MOUTH TWICE DAILY WITH A MEAL 90 tablet 1   metoprolol succinate (TOPROL-XL) 50 MG 24 hr tablet TAKE 1 TABLET BY MOUTH ONCE DAILY WITH OR IMMEDIATLEY FOLLOWING A MEAL 90 tablet 1   OZEMPIC, 1 MG/DOSE, 4 MG/3ML SOPN INJECT 1MG (0.75ML) SUBCUTANEOUSLY ONCE PER WEEK 6 mL 0   predniSONE (DELTASONE) 20 MG tablet FOLLOW DIRECTIONS ON DOSING CALENDAR     No current facility-administered medications on file prior to visit.     ROS see history of present illness  Objective  Physical Exam Vitals:   10/02/20 1052  BP: 126/72  Pulse: 93  Resp: 16  Temp: 97.8 F (36.6 C)  SpO2: 97%    BP Readings from Last 3 Encounters:  10/02/20 126/72  07/01/20 115/60  12/09/19 120/80   Wt Readings from Last 3 Encounters:  10/02/20 298 lb (135.2 kg)  07/01/20 289 lb 12.8 oz (131.5 kg)  06/18/20 271  lb (122.9 kg)    Physical Exam Constitutional:      General: He is not in acute distress.    Appearance: He is not diaphoretic.  Cardiovascular:     Rate and Rhythm: Normal rate and regular rhythm.     Heart sounds: Normal heart sounds.  Pulmonary:     Effort: Pulmonary effort is normal.     Breath sounds: Normal breath sounds.  Skin:    General: Skin is warm and dry.  Neurological:     Mental Status: He is alert.     Assessment/Plan: Please see individual problem list.  Problem List Items Addressed This Visit     Diabetic neuropathy ([place])    Stable.  He will continue gabapentin  300 mg 4 times a day and tramadol 50 mg Every 8 hours as needed.  He will monitor for drowsiness.      Relevant Medications   traMADol (ULTRAM) 50 MG tablet   Essential hypertension    Well-controlled.  Continue amlodipine 10 mg once daily, lisinopril-HCTZ 1 tablet daily, and metoprolol 50 mg daily.      Hyperlipidemia    Continue Lipitor 40 mg once daily.  Check lipid panel and hepatic function panel.      Relevant Orders   Hepatic function panel   Lipid panel   Partial nontraumatic amputation of foot, left ([place])    Doing well after recent surgery.  He will continue to see a specialist.      Type 2 diabetes mellitus without complication, without long-term current use of insulin [name] - Primary    Check A1c.  He will continue Jardiance 25 mg once daily and glipizide 10 mg daily.  He will see the eye doctor as planned.      Relevant Orders   HgB A1c     Return in about 3 months (around 01/02/2021).  This visit occurred during the SARS-CoV-2 public health emergency.  Safety protocols were in place, including screening questions prior to the visit, additional usage of staff PPE, and extensive cleaning of exam room while observing appropriate contact time as indicated for disinfecting solutions.    [name] Rumps, MD [place]

## 2020-10-02 NOTE — Patient Instructions (Signed)
Nice to see you. We will get some lab work today and contact you with the results. 

## 2020-10-02 NOTE — Assessment & Plan Note (Signed)
Check A1c.  He will continue Jardiance 25 mg once daily and glipizide 10 mg daily.  He will see the eye doctor as planned.

## 2020-10-02 NOTE — Assessment & Plan Note (Signed)
Stable.  He will continue gabapentin 300 mg 4 times a day and tramadol 50 mg Every 8 hours as needed.  He will monitor for drowsiness.

## 2020-10-05 ENCOUNTER — Other Ambulatory Visit: Payer: Self-pay | Admitting: Family Medicine

## 2020-10-05 ENCOUNTER — Other Ambulatory Visit: Payer: Self-pay

## 2020-10-05 DIAGNOSIS — E785 Hyperlipidemia, unspecified: Secondary | ICD-10-CM

## 2020-10-05 MED ORDER — ATORVASTATIN CALCIUM 80 MG PO TABS: 40.0000 mg | ORAL_TABLET | Freq: Every day | ORAL | 1 refills | Status: DC

## 2020-10-05 MED ORDER — ATORVASTATIN CALCIUM 80 MG PO TABS: 80.0000 mg | ORAL_TABLET | Freq: Every day | ORAL | 1 refills | Status: DC

## 2020-10-05 NOTE — Addendum Note (Signed)
Addended by: Birdie Sons, Jillian Warth G on: 10/05/2020 12:08 PM   Modules accepted: Orders

## 2020-10-05 NOTE — Progress Notes (Signed)
Increase in Lipitor sent to patient's pharmacy per provider.  Khylah Kendra,cma

## 2020-10-06 ENCOUNTER — Other Ambulatory Visit: Payer: Self-pay | Admitting: Family Medicine

## 2020-10-06 DIAGNOSIS — E1142 Type 2 diabetes mellitus with diabetic polyneuropathy: Secondary | ICD-10-CM

## 2020-10-12 ENCOUNTER — Encounter: Payer: Self-pay | Admitting: Family Medicine

## 2020-10-14 DIAGNOSIS — S98132A Complete traumatic amputation of one left lesser toe, initial encounter: Secondary | ICD-10-CM | POA: Diagnosis not present

## 2020-10-19 DIAGNOSIS — M14672 Charcot's joint, left ankle and foot: Secondary | ICD-10-CM | POA: Diagnosis not present

## 2020-10-19 DIAGNOSIS — Z4789 Encounter for other orthopedic aftercare: Secondary | ICD-10-CM | POA: Diagnosis not present

## 2020-10-30 ENCOUNTER — Other Ambulatory Visit: Payer: Self-pay | Admitting: Family Medicine

## 2020-11-04 ENCOUNTER — Other Ambulatory Visit: Payer: Self-pay | Admitting: Family Medicine

## 2020-11-04 DIAGNOSIS — E1142 Type 2 diabetes mellitus with diabetic polyneuropathy: Secondary | ICD-10-CM

## 2020-11-14 DIAGNOSIS — S98132A Complete traumatic amputation of one left lesser toe, initial encounter: Secondary | ICD-10-CM | POA: Diagnosis not present

## 2020-11-19 ENCOUNTER — Other Ambulatory Visit: Payer: Self-pay

## 2020-11-19 ENCOUNTER — Other Ambulatory Visit: Payer: Self-pay | Admitting: Family Medicine

## 2020-11-19 ENCOUNTER — Other Ambulatory Visit: Payer: Medicare Other

## 2020-11-19 ENCOUNTER — Other Ambulatory Visit (INDEPENDENT_AMBULATORY_CARE_PROVIDER_SITE_OTHER): Payer: Medicare Other

## 2020-11-19 DIAGNOSIS — I10 Essential (primary) hypertension: Secondary | ICD-10-CM | POA: Diagnosis not present

## 2020-11-19 DIAGNOSIS — E785 Hyperlipidemia, unspecified: Secondary | ICD-10-CM | POA: Diagnosis not present

## 2020-11-19 DIAGNOSIS — G629 Polyneuropathy, unspecified: Secondary | ICD-10-CM

## 2020-11-19 LAB — BASIC METABOLIC PANEL
BUN: 13 mg/dL (ref 6–23)
CO2: 31 mEq/L (ref 19–32)
Calcium: 9.5 mg/dL (ref 8.4–10.5)
Chloride: 93 mEq/L — ABNORMAL LOW (ref 96–112)
Creatinine, Ser: 0.8 mg/dL (ref 0.40–1.50)
GFR: 95.35 mL/min (ref >60.00)
Glucose, Bld: 184 mg/dL — ABNORMAL HIGH (ref 70–99)
Potassium: 4.2 mEq/L (ref 3.5–5.1)
Sodium: 134 mEq/L — ABNORMAL LOW (ref 135–145)

## 2020-11-19 LAB — HEPATIC FUNCTION PANEL
ALT: 27 U/L (ref 0–53)
AST: 23 U/L (ref 0–37)
Albumin: 3.2 g/dL — ABNORMAL LOW (ref 3.5–5.2)
Alkaline Phosphatase: 104 U/L (ref 39–117)
Bilirubin, Direct: 0.4 mg/dL — ABNORMAL HIGH (ref 0.0–0.3)
Total Bilirubin: 1.2 mg/dL (ref 0.2–1.2)
Total Protein: 6.5 g/dL (ref 6.0–8.3)

## 2020-11-19 LAB — LDL CHOLESTEROL, DIRECT: Direct LDL: 63 mg/dL

## 2020-11-20 DIAGNOSIS — Z7984 Long term (current) use of oral hypoglycemic drugs: Secondary | ICD-10-CM | POA: Diagnosis not present

## 2020-11-20 DIAGNOSIS — K08109 Complete loss of teeth, unspecified cause, unspecified class: Secondary | ICD-10-CM | POA: Diagnosis not present

## 2020-11-20 DIAGNOSIS — T8469XA Infection and inflammatory reaction due to internal fixation device of other site, initial encounter: Secondary | ICD-10-CM | POA: Diagnosis not present

## 2020-11-20 DIAGNOSIS — Z8616 Personal history of COVID-19: Secondary | ICD-10-CM | POA: Diagnosis not present

## 2020-11-20 DIAGNOSIS — Z7902 Long term (current) use of antithrombotics/antiplatelets: Secondary | ICD-10-CM | POA: Diagnosis not present

## 2020-11-20 DIAGNOSIS — T8189XA Other complications of procedures, not elsewhere classified, initial encounter: Secondary | ICD-10-CM | POA: Diagnosis not present

## 2020-11-20 DIAGNOSIS — E1142 Type 2 diabetes mellitus with diabetic polyneuropathy: Secondary | ICD-10-CM | POA: Diagnosis not present

## 2020-11-20 DIAGNOSIS — Z95820 Peripheral vascular angioplasty status with implants and grafts: Secondary | ICD-10-CM | POA: Diagnosis not present

## 2020-11-20 DIAGNOSIS — A4901 Methicillin susceptible Staphylococcus aureus infection, unspecified site: Secondary | ICD-10-CM | POA: Diagnosis not present

## 2020-11-20 DIAGNOSIS — L089 Local infection of the skin and subcutaneous tissue, unspecified: Secondary | ICD-10-CM | POA: Diagnosis not present

## 2020-11-20 DIAGNOSIS — E1151 Type 2 diabetes mellitus with diabetic peripheral angiopathy without gangrene: Secondary | ICD-10-CM | POA: Diagnosis not present

## 2020-11-20 DIAGNOSIS — E1165 Type 2 diabetes mellitus with hyperglycemia: Secondary | ICD-10-CM | POA: Diagnosis not present

## 2020-11-20 DIAGNOSIS — E785 Hyperlipidemia, unspecified: Secondary | ICD-10-CM | POA: Diagnosis not present

## 2020-11-20 DIAGNOSIS — L02612 Cutaneous abscess of left foot: Secondary | ICD-10-CM | POA: Diagnosis not present

## 2020-11-20 DIAGNOSIS — Z89422 Acquired absence of other left toe(s): Secondary | ICD-10-CM | POA: Diagnosis not present

## 2020-11-20 DIAGNOSIS — M869 Osteomyelitis, unspecified: Secondary | ICD-10-CM | POA: Diagnosis not present

## 2020-11-20 DIAGNOSIS — I70219 Atherosclerosis of native arteries of extremities with intermittent claudication, unspecified extremity: Secondary | ICD-10-CM | POA: Diagnosis not present

## 2020-11-20 DIAGNOSIS — E1169 Type 2 diabetes mellitus with other specified complication: Secondary | ICD-10-CM | POA: Diagnosis not present

## 2020-11-20 DIAGNOSIS — Z472 Encounter for removal of internal fixation device: Secondary | ICD-10-CM | POA: Diagnosis not present

## 2020-11-20 DIAGNOSIS — E119 Type 2 diabetes mellitus without complications: Secondary | ICD-10-CM | POA: Diagnosis not present

## 2020-11-20 DIAGNOSIS — Z7982 Long term (current) use of aspirin: Secondary | ICD-10-CM | POA: Diagnosis not present

## 2020-11-20 DIAGNOSIS — S91302A Unspecified open wound, left foot, initial encounter: Secondary | ICD-10-CM | POA: Diagnosis not present

## 2020-11-20 DIAGNOSIS — I739 Peripheral vascular disease, unspecified: Secondary | ICD-10-CM | POA: Diagnosis not present

## 2020-11-20 DIAGNOSIS — E1140 Type 2 diabetes mellitus with diabetic neuropathy, unspecified: Secondary | ICD-10-CM | POA: Diagnosis not present

## 2020-11-20 DIAGNOSIS — B9561 Methicillin susceptible Staphylococcus aureus infection as the cause of diseases classified elsewhere: Secondary | ICD-10-CM | POA: Diagnosis not present

## 2020-11-20 DIAGNOSIS — Z79899 Other long term (current) drug therapy: Secondary | ICD-10-CM | POA: Diagnosis not present

## 2020-11-20 DIAGNOSIS — I10 Essential (primary) hypertension: Secondary | ICD-10-CM | POA: Diagnosis not present

## 2020-11-20 DIAGNOSIS — T8484XA Pain due to internal orthopedic prosthetic devices, implants and grafts, initial encounter: Secondary | ICD-10-CM | POA: Diagnosis not present

## 2020-11-20 DIAGNOSIS — S98132A Complete traumatic amputation of one left lesser toe, initial encounter: Secondary | ICD-10-CM | POA: Diagnosis not present

## 2020-11-20 DIAGNOSIS — M86172 Other acute osteomyelitis, left ankle and foot: Secondary | ICD-10-CM | POA: Diagnosis not present

## 2020-11-20 DIAGNOSIS — E11628 Type 2 diabetes mellitus with other skin complications: Secondary | ICD-10-CM | POA: Diagnosis not present

## 2020-11-20 DIAGNOSIS — Z87891 Personal history of nicotine dependence: Secondary | ICD-10-CM | POA: Diagnosis not present

## 2020-11-21 DIAGNOSIS — E11628 Type 2 diabetes mellitus with other skin complications: Secondary | ICD-10-CM | POA: Diagnosis not present

## 2020-11-21 DIAGNOSIS — L089 Local infection of the skin and subcutaneous tissue, unspecified: Secondary | ICD-10-CM | POA: Diagnosis not present

## 2020-11-22 ENCOUNTER — Other Ambulatory Visit: Payer: Self-pay | Admitting: Family Medicine

## 2020-11-22 DIAGNOSIS — E119 Type 2 diabetes mellitus without complications: Secondary | ICD-10-CM

## 2020-11-22 DIAGNOSIS — L02612 Cutaneous abscess of left foot: Secondary | ICD-10-CM | POA: Diagnosis not present

## 2020-11-22 DIAGNOSIS — E11628 Type 2 diabetes mellitus with other skin complications: Secondary | ICD-10-CM | POA: Diagnosis not present

## 2020-11-22 DIAGNOSIS — L089 Local infection of the skin and subcutaneous tissue, unspecified: Secondary | ICD-10-CM | POA: Diagnosis not present

## 2020-11-23 DIAGNOSIS — E11628 Type 2 diabetes mellitus with other skin complications: Secondary | ICD-10-CM | POA: Diagnosis not present

## 2020-11-23 DIAGNOSIS — L089 Local infection of the skin and subcutaneous tissue, unspecified: Secondary | ICD-10-CM | POA: Diagnosis not present

## 2020-11-24 ENCOUNTER — Other Ambulatory Visit: Payer: Self-pay | Admitting: Family Medicine

## 2020-11-24 DIAGNOSIS — E1142 Type 2 diabetes mellitus with diabetic polyneuropathy: Secondary | ICD-10-CM | POA: Diagnosis not present

## 2020-11-24 DIAGNOSIS — M86172 Other acute osteomyelitis, left ankle and foot: Secondary | ICD-10-CM | POA: Diagnosis not present

## 2020-11-24 DIAGNOSIS — L089 Local infection of the skin and subcutaneous tissue, unspecified: Secondary | ICD-10-CM | POA: Diagnosis not present

## 2020-11-24 DIAGNOSIS — I739 Peripheral vascular disease, unspecified: Secondary | ICD-10-CM | POA: Diagnosis not present

## 2020-11-24 DIAGNOSIS — S98132A Complete traumatic amputation of one left lesser toe, initial encounter: Secondary | ICD-10-CM | POA: Diagnosis not present

## 2020-11-24 DIAGNOSIS — E11628 Type 2 diabetes mellitus with other skin complications: Secondary | ICD-10-CM | POA: Diagnosis not present

## 2020-11-25 DIAGNOSIS — E1142 Type 2 diabetes mellitus with diabetic polyneuropathy: Secondary | ICD-10-CM | POA: Diagnosis not present

## 2020-11-25 DIAGNOSIS — S98132A Complete traumatic amputation of one left lesser toe, initial encounter: Secondary | ICD-10-CM | POA: Diagnosis not present

## 2020-11-25 DIAGNOSIS — E11628 Type 2 diabetes mellitus with other skin complications: Secondary | ICD-10-CM | POA: Diagnosis not present

## 2020-11-25 DIAGNOSIS — L089 Local infection of the skin and subcutaneous tissue, unspecified: Secondary | ICD-10-CM | POA: Diagnosis not present

## 2020-11-26 DIAGNOSIS — L089 Local infection of the skin and subcutaneous tissue, unspecified: Secondary | ICD-10-CM | POA: Diagnosis not present

## 2020-11-26 DIAGNOSIS — E11628 Type 2 diabetes mellitus with other skin complications: Secondary | ICD-10-CM | POA: Diagnosis not present

## 2020-11-26 DIAGNOSIS — E1142 Type 2 diabetes mellitus with diabetic polyneuropathy: Secondary | ICD-10-CM | POA: Diagnosis not present

## 2020-11-26 DIAGNOSIS — S98132A Complete traumatic amputation of one left lesser toe, initial encounter: Secondary | ICD-10-CM | POA: Diagnosis not present

## 2020-11-27 DIAGNOSIS — L089 Local infection of the skin and subcutaneous tissue, unspecified: Secondary | ICD-10-CM | POA: Diagnosis not present

## 2020-11-27 DIAGNOSIS — M869 Osteomyelitis, unspecified: Secondary | ICD-10-CM | POA: Diagnosis not present

## 2020-11-27 DIAGNOSIS — E1142 Type 2 diabetes mellitus with diabetic polyneuropathy: Secondary | ICD-10-CM | POA: Diagnosis not present

## 2020-11-27 DIAGNOSIS — E11628 Type 2 diabetes mellitus with other skin complications: Secondary | ICD-10-CM | POA: Diagnosis not present

## 2020-11-27 DIAGNOSIS — S98132A Complete traumatic amputation of one left lesser toe, initial encounter: Secondary | ICD-10-CM | POA: Diagnosis not present

## 2020-11-28 DIAGNOSIS — Z9181 History of falling: Secondary | ICD-10-CM | POA: Diagnosis not present

## 2020-11-28 DIAGNOSIS — T8469XA Infection and inflammatory reaction due to internal fixation device of other site, initial encounter: Secondary | ICD-10-CM | POA: Diagnosis not present

## 2020-11-28 DIAGNOSIS — Z7901 Long term (current) use of anticoagulants: Secondary | ICD-10-CM | POA: Diagnosis not present

## 2020-11-28 DIAGNOSIS — E1151 Type 2 diabetes mellitus with diabetic peripheral angiopathy without gangrene: Secondary | ICD-10-CM | POA: Diagnosis not present

## 2020-11-28 DIAGNOSIS — E11610 Type 2 diabetes mellitus with diabetic neuropathic arthropathy: Secondary | ICD-10-CM | POA: Diagnosis not present

## 2020-11-28 DIAGNOSIS — L02612 Cutaneous abscess of left foot: Secondary | ICD-10-CM | POA: Diagnosis not present

## 2020-11-28 DIAGNOSIS — A4902 Methicillin resistant Staphylococcus aureus infection, unspecified site: Secondary | ICD-10-CM | POA: Diagnosis not present

## 2020-11-28 DIAGNOSIS — Z89422 Acquired absence of other left toe(s): Secondary | ICD-10-CM | POA: Diagnosis not present

## 2020-11-28 DIAGNOSIS — I70219 Atherosclerosis of native arteries of extremities with intermittent claudication, unspecified extremity: Secondary | ICD-10-CM | POA: Diagnosis not present

## 2020-11-28 DIAGNOSIS — Z79899 Other long term (current) drug therapy: Secondary | ICD-10-CM | POA: Diagnosis not present

## 2020-11-28 DIAGNOSIS — E785 Hyperlipidemia, unspecified: Secondary | ICD-10-CM | POA: Diagnosis not present

## 2020-11-28 DIAGNOSIS — Z87891 Personal history of nicotine dependence: Secondary | ICD-10-CM | POA: Diagnosis not present

## 2020-11-28 DIAGNOSIS — Z7982 Long term (current) use of aspirin: Secondary | ICD-10-CM | POA: Diagnosis not present

## 2020-11-28 DIAGNOSIS — Z7985 Long-term (current) use of injectable non-insulin antidiabetic drugs: Secondary | ICD-10-CM | POA: Diagnosis not present

## 2020-11-28 DIAGNOSIS — Z955 Presence of coronary angioplasty implant and graft: Secondary | ICD-10-CM | POA: Diagnosis not present

## 2020-11-28 DIAGNOSIS — I10 Essential (primary) hypertension: Secondary | ICD-10-CM | POA: Diagnosis not present

## 2020-11-28 DIAGNOSIS — E1142 Type 2 diabetes mellitus with diabetic polyneuropathy: Secondary | ICD-10-CM | POA: Diagnosis not present

## 2020-11-28 DIAGNOSIS — Z792 Long term (current) use of antibiotics: Secondary | ICD-10-CM | POA: Diagnosis not present

## 2020-11-28 DIAGNOSIS — Z7984 Long term (current) use of oral hypoglycemic drugs: Secondary | ICD-10-CM | POA: Diagnosis not present

## 2020-11-30 DIAGNOSIS — E11621 Type 2 diabetes mellitus with foot ulcer: Secondary | ICD-10-CM | POA: Diagnosis not present

## 2020-11-30 DIAGNOSIS — A4902 Methicillin resistant Staphylococcus aureus infection, unspecified site: Secondary | ICD-10-CM | POA: Diagnosis not present

## 2020-12-01 DIAGNOSIS — T8469XA Infection and inflammatory reaction due to internal fixation device of other site, initial encounter: Secondary | ICD-10-CM | POA: Diagnosis not present

## 2020-12-01 DIAGNOSIS — Z9181 History of falling: Secondary | ICD-10-CM | POA: Diagnosis not present

## 2020-12-01 DIAGNOSIS — E785 Hyperlipidemia, unspecified: Secondary | ICD-10-CM | POA: Diagnosis not present

## 2020-12-01 DIAGNOSIS — E1142 Type 2 diabetes mellitus with diabetic polyneuropathy: Secondary | ICD-10-CM | POA: Diagnosis not present

## 2020-12-01 DIAGNOSIS — Z7901 Long term (current) use of anticoagulants: Secondary | ICD-10-CM | POA: Diagnosis not present

## 2020-12-01 DIAGNOSIS — Z79899 Other long term (current) drug therapy: Secondary | ICD-10-CM | POA: Diagnosis not present

## 2020-12-01 DIAGNOSIS — E11610 Type 2 diabetes mellitus with diabetic neuropathic arthropathy: Secondary | ICD-10-CM | POA: Diagnosis not present

## 2020-12-01 DIAGNOSIS — Z87891 Personal history of nicotine dependence: Secondary | ICD-10-CM | POA: Diagnosis not present

## 2020-12-01 DIAGNOSIS — Z7984 Long term (current) use of oral hypoglycemic drugs: Secondary | ICD-10-CM | POA: Diagnosis not present

## 2020-12-01 DIAGNOSIS — Z7985 Long-term (current) use of injectable non-insulin antidiabetic drugs: Secondary | ICD-10-CM | POA: Diagnosis not present

## 2020-12-01 DIAGNOSIS — I70219 Atherosclerosis of native arteries of extremities with intermittent claudication, unspecified extremity: Secondary | ICD-10-CM | POA: Diagnosis not present

## 2020-12-01 DIAGNOSIS — Z89422 Acquired absence of other left toe(s): Secondary | ICD-10-CM | POA: Diagnosis not present

## 2020-12-01 DIAGNOSIS — Z792 Long term (current) use of antibiotics: Secondary | ICD-10-CM | POA: Diagnosis not present

## 2020-12-01 DIAGNOSIS — E1151 Type 2 diabetes mellitus with diabetic peripheral angiopathy without gangrene: Secondary | ICD-10-CM | POA: Diagnosis not present

## 2020-12-01 DIAGNOSIS — Z7982 Long term (current) use of aspirin: Secondary | ICD-10-CM | POA: Diagnosis not present

## 2020-12-01 DIAGNOSIS — L02612 Cutaneous abscess of left foot: Secondary | ICD-10-CM | POA: Diagnosis not present

## 2020-12-01 DIAGNOSIS — I10 Essential (primary) hypertension: Secondary | ICD-10-CM | POA: Diagnosis not present

## 2020-12-01 DIAGNOSIS — Z955 Presence of coronary angioplasty implant and graft: Secondary | ICD-10-CM | POA: Diagnosis not present

## 2020-12-02 ENCOUNTER — Telehealth: Payer: Self-pay

## 2020-12-02 NOTE — Telephone Encounter (Signed)
Transition Care Management Follow-up Telephone Call Date of discharge and from where: 11/27/2020 / [facility] How have you been since you were released from the hospital? "Its been fine" Any questions or concerns? No  Items Reviewed: Did the pt receive and understand the discharge instructions provided? Yes  Medications obtained and verified? Yes  Other? No  Any new allergies since your discharge? Yes  Dietary orders reviewed? No Do you have support at home? Yes   Home Care and Equipment/Supplies: Were home health services ordered? yes If so, what is the name of the agency? [facility]  and [facility]   Has the agency set up a time to come to the patient's home? yes Were any new equipment or medical supplies ordered?  Yes: patient has wound vac What is the name of the medical supply agency? [facility] Were you able to get the supplies/equipment? yes Do you have any questions related to the use of the equipment or supplies? No  Functional Questionnaire: (I = Independent and D = Dependent) ADLs: I  Bathing/Dressing- I  Meal Prep- D  Eating- I  Maintaining continence- I  Transferring/Ambulation- I  Managing Meds- I  Follow up appointments reviewed:  PCP Hospital f/u appt confirmed? No  Scheduled to see  Specialist Hospital f/u appt confirmed? Yes  Scheduled to see Dr. Marlynn Perking on 12/03/2020 @ 9:30am. Are transportation arrangements needed? Yes  If their condition worsens, is the pt aware to call PCP or go to the Emergency Dept.? Yes Was the patient provided with contact information for the PCP's office or ED? Yes Was to pt encouraged to call back with questions or concerns? Yes  George Ina RN,BSN,CCM RN Case Manager [employer] [phone]

## 2020-12-05 DIAGNOSIS — M869 Osteomyelitis, unspecified: Secondary | ICD-10-CM | POA: Diagnosis not present

## 2020-12-05 DIAGNOSIS — L02612 Cutaneous abscess of left foot: Secondary | ICD-10-CM | POA: Diagnosis not present

## 2020-12-05 DIAGNOSIS — Z955 Presence of coronary angioplasty implant and graft: Secondary | ICD-10-CM | POA: Diagnosis not present

## 2020-12-05 DIAGNOSIS — I70219 Atherosclerosis of native arteries of extremities with intermittent claudication, unspecified extremity: Secondary | ICD-10-CM | POA: Diagnosis not present

## 2020-12-05 DIAGNOSIS — E11610 Type 2 diabetes mellitus with diabetic neuropathic arthropathy: Secondary | ICD-10-CM | POA: Diagnosis not present

## 2020-12-05 DIAGNOSIS — T8469XA Infection and inflammatory reaction due to internal fixation device of other site, initial encounter: Secondary | ICD-10-CM | POA: Diagnosis not present

## 2020-12-05 DIAGNOSIS — I10 Essential (primary) hypertension: Secondary | ICD-10-CM | POA: Diagnosis not present

## 2020-12-05 DIAGNOSIS — Z89422 Acquired absence of other left toe(s): Secondary | ICD-10-CM | POA: Diagnosis not present

## 2020-12-05 DIAGNOSIS — E1142 Type 2 diabetes mellitus with diabetic polyneuropathy: Secondary | ICD-10-CM | POA: Diagnosis not present

## 2020-12-05 DIAGNOSIS — Z9181 History of falling: Secondary | ICD-10-CM | POA: Diagnosis not present

## 2020-12-05 DIAGNOSIS — Z87891 Personal history of nicotine dependence: Secondary | ICD-10-CM | POA: Diagnosis not present

## 2020-12-05 DIAGNOSIS — Z79899 Other long term (current) drug therapy: Secondary | ICD-10-CM | POA: Diagnosis not present

## 2020-12-05 DIAGNOSIS — E785 Hyperlipidemia, unspecified: Secondary | ICD-10-CM | POA: Diagnosis not present

## 2020-12-05 DIAGNOSIS — Z7985 Long-term (current) use of injectable non-insulin antidiabetic drugs: Secondary | ICD-10-CM | POA: Diagnosis not present

## 2020-12-05 DIAGNOSIS — Z7982 Long term (current) use of aspirin: Secondary | ICD-10-CM | POA: Diagnosis not present

## 2020-12-05 DIAGNOSIS — Z7984 Long term (current) use of oral hypoglycemic drugs: Secondary | ICD-10-CM | POA: Diagnosis not present

## 2020-12-05 DIAGNOSIS — E1151 Type 2 diabetes mellitus with diabetic peripheral angiopathy without gangrene: Secondary | ICD-10-CM | POA: Diagnosis not present

## 2020-12-05 DIAGNOSIS — Z7901 Long term (current) use of anticoagulants: Secondary | ICD-10-CM | POA: Diagnosis not present

## 2020-12-05 DIAGNOSIS — Z792 Long term (current) use of antibiotics: Secondary | ICD-10-CM | POA: Diagnosis not present

## 2020-12-07 ENCOUNTER — Other Ambulatory Visit: Payer: Self-pay | Admitting: Family Medicine

## 2020-12-07 DIAGNOSIS — E1142 Type 2 diabetes mellitus with diabetic polyneuropathy: Secondary | ICD-10-CM | POA: Diagnosis not present

## 2020-12-07 DIAGNOSIS — T8469XA Infection and inflammatory reaction due to internal fixation device of other site, initial encounter: Secondary | ICD-10-CM | POA: Diagnosis not present

## 2020-12-07 DIAGNOSIS — Z955 Presence of coronary angioplasty implant and graft: Secondary | ICD-10-CM | POA: Diagnosis not present

## 2020-12-07 DIAGNOSIS — E1151 Type 2 diabetes mellitus with diabetic peripheral angiopathy without gangrene: Secondary | ICD-10-CM | POA: Diagnosis not present

## 2020-12-07 DIAGNOSIS — Z9181 History of falling: Secondary | ICD-10-CM | POA: Diagnosis not present

## 2020-12-07 DIAGNOSIS — Z89422 Acquired absence of other left toe(s): Secondary | ICD-10-CM | POA: Diagnosis not present

## 2020-12-07 DIAGNOSIS — I70219 Atherosclerosis of native arteries of extremities with intermittent claudication, unspecified extremity: Secondary | ICD-10-CM | POA: Diagnosis not present

## 2020-12-07 DIAGNOSIS — Z7901 Long term (current) use of anticoagulants: Secondary | ICD-10-CM | POA: Diagnosis not present

## 2020-12-07 DIAGNOSIS — Z7984 Long term (current) use of oral hypoglycemic drugs: Secondary | ICD-10-CM | POA: Diagnosis not present

## 2020-12-07 DIAGNOSIS — Z87891 Personal history of nicotine dependence: Secondary | ICD-10-CM | POA: Diagnosis not present

## 2020-12-07 DIAGNOSIS — A4902 Methicillin resistant Staphylococcus aureus infection, unspecified site: Secondary | ICD-10-CM | POA: Diagnosis not present

## 2020-12-07 DIAGNOSIS — E785 Hyperlipidemia, unspecified: Secondary | ICD-10-CM | POA: Diagnosis not present

## 2020-12-07 DIAGNOSIS — E11621 Type 2 diabetes mellitus with foot ulcer: Secondary | ICD-10-CM | POA: Diagnosis not present

## 2020-12-07 DIAGNOSIS — Z792 Long term (current) use of antibiotics: Secondary | ICD-10-CM | POA: Diagnosis not present

## 2020-12-07 DIAGNOSIS — Z7985 Long-term (current) use of injectable non-insulin antidiabetic drugs: Secondary | ICD-10-CM | POA: Diagnosis not present

## 2020-12-07 DIAGNOSIS — Z7982 Long term (current) use of aspirin: Secondary | ICD-10-CM | POA: Diagnosis not present

## 2020-12-07 DIAGNOSIS — Z79899 Other long term (current) drug therapy: Secondary | ICD-10-CM | POA: Diagnosis not present

## 2020-12-07 DIAGNOSIS — I10 Essential (primary) hypertension: Secondary | ICD-10-CM | POA: Diagnosis not present

## 2020-12-07 DIAGNOSIS — E11610 Type 2 diabetes mellitus with diabetic neuropathic arthropathy: Secondary | ICD-10-CM | POA: Diagnosis not present

## 2020-12-07 DIAGNOSIS — L02612 Cutaneous abscess of left foot: Secondary | ICD-10-CM | POA: Diagnosis not present

## 2020-12-08 ENCOUNTER — Other Ambulatory Visit: Payer: Self-pay | Admitting: Family Medicine

## 2020-12-08 ENCOUNTER — Ambulatory Visit: Payer: Medicare Other | Admitting: Physician Assistant

## 2020-12-08 DIAGNOSIS — E1142 Type 2 diabetes mellitus with diabetic polyneuropathy: Secondary | ICD-10-CM

## 2020-12-09 DIAGNOSIS — Z7982 Long term (current) use of aspirin: Secondary | ICD-10-CM | POA: Diagnosis not present

## 2020-12-09 DIAGNOSIS — E1151 Type 2 diabetes mellitus with diabetic peripheral angiopathy without gangrene: Secondary | ICD-10-CM | POA: Diagnosis not present

## 2020-12-09 DIAGNOSIS — T8469XA Infection and inflammatory reaction due to internal fixation device of other site, initial encounter: Secondary | ICD-10-CM | POA: Diagnosis not present

## 2020-12-09 DIAGNOSIS — I10 Essential (primary) hypertension: Secondary | ICD-10-CM | POA: Diagnosis not present

## 2020-12-09 DIAGNOSIS — Z9181 History of falling: Secondary | ICD-10-CM | POA: Diagnosis not present

## 2020-12-09 DIAGNOSIS — Z79899 Other long term (current) drug therapy: Secondary | ICD-10-CM | POA: Diagnosis not present

## 2020-12-09 DIAGNOSIS — E1142 Type 2 diabetes mellitus with diabetic polyneuropathy: Secondary | ICD-10-CM | POA: Diagnosis not present

## 2020-12-09 DIAGNOSIS — Z955 Presence of coronary angioplasty implant and graft: Secondary | ICD-10-CM | POA: Diagnosis not present

## 2020-12-09 DIAGNOSIS — L02612 Cutaneous abscess of left foot: Secondary | ICD-10-CM | POA: Diagnosis not present

## 2020-12-09 DIAGNOSIS — E11610 Type 2 diabetes mellitus with diabetic neuropathic arthropathy: Secondary | ICD-10-CM | POA: Diagnosis not present

## 2020-12-09 DIAGNOSIS — Z7984 Long term (current) use of oral hypoglycemic drugs: Secondary | ICD-10-CM | POA: Diagnosis not present

## 2020-12-09 DIAGNOSIS — Z7985 Long-term (current) use of injectable non-insulin antidiabetic drugs: Secondary | ICD-10-CM | POA: Diagnosis not present

## 2020-12-09 DIAGNOSIS — Z87891 Personal history of nicotine dependence: Secondary | ICD-10-CM | POA: Diagnosis not present

## 2020-12-09 DIAGNOSIS — E785 Hyperlipidemia, unspecified: Secondary | ICD-10-CM | POA: Diagnosis not present

## 2020-12-09 DIAGNOSIS — Z7901 Long term (current) use of anticoagulants: Secondary | ICD-10-CM | POA: Diagnosis not present

## 2020-12-09 DIAGNOSIS — I70219 Atherosclerosis of native arteries of extremities with intermittent claudication, unspecified extremity: Secondary | ICD-10-CM | POA: Diagnosis not present

## 2020-12-09 DIAGNOSIS — Z89422 Acquired absence of other left toe(s): Secondary | ICD-10-CM | POA: Diagnosis not present

## 2020-12-09 DIAGNOSIS — Z792 Long term (current) use of antibiotics: Secondary | ICD-10-CM | POA: Diagnosis not present

## 2020-12-11 DIAGNOSIS — Z87891 Personal history of nicotine dependence: Secondary | ICD-10-CM | POA: Diagnosis not present

## 2020-12-11 DIAGNOSIS — Z955 Presence of coronary angioplasty implant and graft: Secondary | ICD-10-CM | POA: Diagnosis not present

## 2020-12-11 DIAGNOSIS — T8469XA Infection and inflammatory reaction due to internal fixation device of other site, initial encounter: Secondary | ICD-10-CM | POA: Diagnosis not present

## 2020-12-11 DIAGNOSIS — L02612 Cutaneous abscess of left foot: Secondary | ICD-10-CM | POA: Diagnosis not present

## 2020-12-11 DIAGNOSIS — Z7901 Long term (current) use of anticoagulants: Secondary | ICD-10-CM | POA: Diagnosis not present

## 2020-12-11 DIAGNOSIS — Z9181 History of falling: Secondary | ICD-10-CM | POA: Diagnosis not present

## 2020-12-11 DIAGNOSIS — Z79899 Other long term (current) drug therapy: Secondary | ICD-10-CM | POA: Diagnosis not present

## 2020-12-11 DIAGNOSIS — E1142 Type 2 diabetes mellitus with diabetic polyneuropathy: Secondary | ICD-10-CM | POA: Diagnosis not present

## 2020-12-11 DIAGNOSIS — I10 Essential (primary) hypertension: Secondary | ICD-10-CM | POA: Diagnosis not present

## 2020-12-11 DIAGNOSIS — I70219 Atherosclerosis of native arteries of extremities with intermittent claudication, unspecified extremity: Secondary | ICD-10-CM | POA: Diagnosis not present

## 2020-12-11 DIAGNOSIS — Z7982 Long term (current) use of aspirin: Secondary | ICD-10-CM | POA: Diagnosis not present

## 2020-12-11 DIAGNOSIS — E11610 Type 2 diabetes mellitus with diabetic neuropathic arthropathy: Secondary | ICD-10-CM | POA: Diagnosis not present

## 2020-12-11 DIAGNOSIS — E785 Hyperlipidemia, unspecified: Secondary | ICD-10-CM | POA: Diagnosis not present

## 2020-12-11 DIAGNOSIS — Z792 Long term (current) use of antibiotics: Secondary | ICD-10-CM | POA: Diagnosis not present

## 2020-12-11 DIAGNOSIS — Z7984 Long term (current) use of oral hypoglycemic drugs: Secondary | ICD-10-CM | POA: Diagnosis not present

## 2020-12-11 DIAGNOSIS — Z89422 Acquired absence of other left toe(s): Secondary | ICD-10-CM | POA: Diagnosis not present

## 2020-12-11 DIAGNOSIS — E1151 Type 2 diabetes mellitus with diabetic peripheral angiopathy without gangrene: Secondary | ICD-10-CM | POA: Diagnosis not present

## 2020-12-11 DIAGNOSIS — Z7985 Long-term (current) use of injectable non-insulin antidiabetic drugs: Secondary | ICD-10-CM | POA: Diagnosis not present

## 2020-12-11 MED ORDER — TRAMADOL HCL 50 MG PO TABS: ORAL_TABLET | ORAL | 0 refills | Status: DC

## 2020-12-11 NOTE — Telephone Encounter (Signed)
Notified patient medication has been filled.

## 2020-12-11 NOTE — Telephone Encounter (Signed)
Patient would like to know why this has not been refilled.

## 2020-12-12 DIAGNOSIS — M869 Osteomyelitis, unspecified: Secondary | ICD-10-CM | POA: Diagnosis not present

## 2020-12-14 DIAGNOSIS — Z792 Long term (current) use of antibiotics: Secondary | ICD-10-CM | POA: Diagnosis not present

## 2020-12-14 DIAGNOSIS — L02612 Cutaneous abscess of left foot: Secondary | ICD-10-CM | POA: Diagnosis not present

## 2020-12-14 DIAGNOSIS — S98132A Complete traumatic amputation of one left lesser toe, initial encounter: Secondary | ICD-10-CM | POA: Diagnosis not present

## 2020-12-14 DIAGNOSIS — Z7984 Long term (current) use of oral hypoglycemic drugs: Secondary | ICD-10-CM | POA: Diagnosis not present

## 2020-12-14 DIAGNOSIS — Z79899 Other long term (current) drug therapy: Secondary | ICD-10-CM | POA: Diagnosis not present

## 2020-12-14 DIAGNOSIS — Z7985 Long-term (current) use of injectable non-insulin antidiabetic drugs: Secondary | ICD-10-CM | POA: Diagnosis not present

## 2020-12-14 DIAGNOSIS — Z7901 Long term (current) use of anticoagulants: Secondary | ICD-10-CM | POA: Diagnosis not present

## 2020-12-14 DIAGNOSIS — E1142 Type 2 diabetes mellitus with diabetic polyneuropathy: Secondary | ICD-10-CM | POA: Diagnosis not present

## 2020-12-14 DIAGNOSIS — I10 Essential (primary) hypertension: Secondary | ICD-10-CM | POA: Diagnosis not present

## 2020-12-14 DIAGNOSIS — Z87891 Personal history of nicotine dependence: Secondary | ICD-10-CM | POA: Diagnosis not present

## 2020-12-14 DIAGNOSIS — Z89422 Acquired absence of other left toe(s): Secondary | ICD-10-CM | POA: Diagnosis not present

## 2020-12-14 DIAGNOSIS — E11610 Type 2 diabetes mellitus with diabetic neuropathic arthropathy: Secondary | ICD-10-CM | POA: Diagnosis not present

## 2020-12-14 DIAGNOSIS — Z9181 History of falling: Secondary | ICD-10-CM | POA: Diagnosis not present

## 2020-12-14 DIAGNOSIS — I70219 Atherosclerosis of native arteries of extremities with intermittent claudication, unspecified extremity: Secondary | ICD-10-CM | POA: Diagnosis not present

## 2020-12-14 DIAGNOSIS — E785 Hyperlipidemia, unspecified: Secondary | ICD-10-CM | POA: Diagnosis not present

## 2020-12-14 DIAGNOSIS — E1151 Type 2 diabetes mellitus with diabetic peripheral angiopathy without gangrene: Secondary | ICD-10-CM | POA: Diagnosis not present

## 2020-12-14 DIAGNOSIS — Z955 Presence of coronary angioplasty implant and graft: Secondary | ICD-10-CM | POA: Diagnosis not present

## 2020-12-14 DIAGNOSIS — T8469XA Infection and inflammatory reaction due to internal fixation device of other site, initial encounter: Secondary | ICD-10-CM | POA: Diagnosis not present

## 2020-12-14 DIAGNOSIS — Z7982 Long term (current) use of aspirin: Secondary | ICD-10-CM | POA: Diagnosis not present

## 2020-12-14 NOTE — Telephone Encounter (Signed)
Noted. PCP was out of office.

## 2020-12-15 DIAGNOSIS — A4902 Methicillin resistant Staphylococcus aureus infection, unspecified site: Secondary | ICD-10-CM | POA: Diagnosis not present

## 2020-12-15 DIAGNOSIS — E11621 Type 2 diabetes mellitus with foot ulcer: Secondary | ICD-10-CM | POA: Diagnosis not present

## 2020-12-16 DIAGNOSIS — E11610 Type 2 diabetes mellitus with diabetic neuropathic arthropathy: Secondary | ICD-10-CM | POA: Diagnosis not present

## 2020-12-16 DIAGNOSIS — L089 Local infection of the skin and subcutaneous tissue, unspecified: Secondary | ICD-10-CM | POA: Diagnosis not present

## 2020-12-16 DIAGNOSIS — L97409 Non-pressure chronic ulcer of unspecified heel and midfoot with unspecified severity: Secondary | ICD-10-CM | POA: Diagnosis not present

## 2020-12-16 DIAGNOSIS — S98132A Complete traumatic amputation of one left lesser toe, initial encounter: Secondary | ICD-10-CM | POA: Diagnosis not present

## 2020-12-16 DIAGNOSIS — E11621 Type 2 diabetes mellitus with foot ulcer: Secondary | ICD-10-CM | POA: Diagnosis not present

## 2020-12-16 DIAGNOSIS — E11628 Type 2 diabetes mellitus with other skin complications: Secondary | ICD-10-CM | POA: Diagnosis not present

## 2020-12-18 DIAGNOSIS — I70219 Atherosclerosis of native arteries of extremities with intermittent claudication, unspecified extremity: Secondary | ICD-10-CM | POA: Diagnosis not present

## 2020-12-18 DIAGNOSIS — I10 Essential (primary) hypertension: Secondary | ICD-10-CM | POA: Diagnosis not present

## 2020-12-18 DIAGNOSIS — E11610 Type 2 diabetes mellitus with diabetic neuropathic arthropathy: Secondary | ICD-10-CM | POA: Diagnosis not present

## 2020-12-18 DIAGNOSIS — Z7984 Long term (current) use of oral hypoglycemic drugs: Secondary | ICD-10-CM | POA: Diagnosis not present

## 2020-12-18 DIAGNOSIS — E1151 Type 2 diabetes mellitus with diabetic peripheral angiopathy without gangrene: Secondary | ICD-10-CM | POA: Diagnosis not present

## 2020-12-18 DIAGNOSIS — Z79899 Other long term (current) drug therapy: Secondary | ICD-10-CM | POA: Diagnosis not present

## 2020-12-18 DIAGNOSIS — Z89422 Acquired absence of other left toe(s): Secondary | ICD-10-CM | POA: Diagnosis not present

## 2020-12-18 DIAGNOSIS — Z87891 Personal history of nicotine dependence: Secondary | ICD-10-CM | POA: Diagnosis not present

## 2020-12-18 DIAGNOSIS — Z7982 Long term (current) use of aspirin: Secondary | ICD-10-CM | POA: Diagnosis not present

## 2020-12-18 DIAGNOSIS — Z9181 History of falling: Secondary | ICD-10-CM | POA: Diagnosis not present

## 2020-12-18 DIAGNOSIS — L02612 Cutaneous abscess of left foot: Secondary | ICD-10-CM | POA: Diagnosis not present

## 2020-12-18 DIAGNOSIS — Z955 Presence of coronary angioplasty implant and graft: Secondary | ICD-10-CM | POA: Diagnosis not present

## 2020-12-18 DIAGNOSIS — Z7985 Long-term (current) use of injectable non-insulin antidiabetic drugs: Secondary | ICD-10-CM | POA: Diagnosis not present

## 2020-12-18 DIAGNOSIS — Z7901 Long term (current) use of anticoagulants: Secondary | ICD-10-CM | POA: Diagnosis not present

## 2020-12-18 DIAGNOSIS — Z792 Long term (current) use of antibiotics: Secondary | ICD-10-CM | POA: Diagnosis not present

## 2020-12-18 DIAGNOSIS — E785 Hyperlipidemia, unspecified: Secondary | ICD-10-CM | POA: Diagnosis not present

## 2020-12-18 DIAGNOSIS — E1142 Type 2 diabetes mellitus with diabetic polyneuropathy: Secondary | ICD-10-CM | POA: Diagnosis not present

## 2020-12-18 DIAGNOSIS — T8469XA Infection and inflammatory reaction due to internal fixation device of other site, initial encounter: Secondary | ICD-10-CM | POA: Diagnosis not present

## 2020-12-19 DIAGNOSIS — M869 Osteomyelitis, unspecified: Secondary | ICD-10-CM | POA: Diagnosis not present

## 2020-12-21 DIAGNOSIS — L02612 Cutaneous abscess of left foot: Secondary | ICD-10-CM | POA: Diagnosis not present

## 2020-12-21 DIAGNOSIS — Z7982 Long term (current) use of aspirin: Secondary | ICD-10-CM | POA: Diagnosis not present

## 2020-12-21 DIAGNOSIS — Z7901 Long term (current) use of anticoagulants: Secondary | ICD-10-CM | POA: Diagnosis not present

## 2020-12-21 DIAGNOSIS — E1151 Type 2 diabetes mellitus with diabetic peripheral angiopathy without gangrene: Secondary | ICD-10-CM | POA: Diagnosis not present

## 2020-12-21 DIAGNOSIS — Z79899 Other long term (current) drug therapy: Secondary | ICD-10-CM | POA: Diagnosis not present

## 2020-12-21 DIAGNOSIS — Z7984 Long term (current) use of oral hypoglycemic drugs: Secondary | ICD-10-CM | POA: Diagnosis not present

## 2020-12-21 DIAGNOSIS — A4902 Methicillin resistant Staphylococcus aureus infection, unspecified site: Secondary | ICD-10-CM | POA: Diagnosis not present

## 2020-12-21 DIAGNOSIS — E785 Hyperlipidemia, unspecified: Secondary | ICD-10-CM | POA: Diagnosis not present

## 2020-12-21 DIAGNOSIS — Z89422 Acquired absence of other left toe(s): Secondary | ICD-10-CM | POA: Diagnosis not present

## 2020-12-21 DIAGNOSIS — Z9181 History of falling: Secondary | ICD-10-CM | POA: Diagnosis not present

## 2020-12-21 DIAGNOSIS — E1142 Type 2 diabetes mellitus with diabetic polyneuropathy: Secondary | ICD-10-CM | POA: Diagnosis not present

## 2020-12-21 DIAGNOSIS — E11621 Type 2 diabetes mellitus with foot ulcer: Secondary | ICD-10-CM | POA: Diagnosis not present

## 2020-12-21 DIAGNOSIS — I70219 Atherosclerosis of native arteries of extremities with intermittent claudication, unspecified extremity: Secondary | ICD-10-CM | POA: Diagnosis not present

## 2020-12-21 DIAGNOSIS — Z87891 Personal history of nicotine dependence: Secondary | ICD-10-CM | POA: Diagnosis not present

## 2020-12-21 DIAGNOSIS — I10 Essential (primary) hypertension: Secondary | ICD-10-CM | POA: Diagnosis not present

## 2020-12-21 DIAGNOSIS — Z955 Presence of coronary angioplasty implant and graft: Secondary | ICD-10-CM | POA: Diagnosis not present

## 2020-12-21 DIAGNOSIS — T8469XA Infection and inflammatory reaction due to internal fixation device of other site, initial encounter: Secondary | ICD-10-CM | POA: Diagnosis not present

## 2020-12-21 DIAGNOSIS — Z792 Long term (current) use of antibiotics: Secondary | ICD-10-CM | POA: Diagnosis not present

## 2020-12-21 DIAGNOSIS — Z7985 Long-term (current) use of injectable non-insulin antidiabetic drugs: Secondary | ICD-10-CM | POA: Diagnosis not present

## 2020-12-21 DIAGNOSIS — E11610 Type 2 diabetes mellitus with diabetic neuropathic arthropathy: Secondary | ICD-10-CM | POA: Diagnosis not present

## 2020-12-22 ENCOUNTER — Other Ambulatory Visit: Payer: Self-pay | Admitting: Family Medicine

## 2020-12-22 DIAGNOSIS — S91302A Unspecified open wound, left foot, initial encounter: Secondary | ICD-10-CM | POA: Diagnosis not present

## 2020-12-22 DIAGNOSIS — T8189XA Other complications of procedures, not elsewhere classified, initial encounter: Secondary | ICD-10-CM | POA: Diagnosis not present

## 2020-12-23 DIAGNOSIS — Z89422 Acquired absence of other left toe(s): Secondary | ICD-10-CM | POA: Diagnosis not present

## 2020-12-23 DIAGNOSIS — Z9181 History of falling: Secondary | ICD-10-CM | POA: Diagnosis not present

## 2020-12-23 DIAGNOSIS — Z955 Presence of coronary angioplasty implant and graft: Secondary | ICD-10-CM | POA: Diagnosis not present

## 2020-12-23 DIAGNOSIS — Z7982 Long term (current) use of aspirin: Secondary | ICD-10-CM | POA: Diagnosis not present

## 2020-12-23 DIAGNOSIS — L02612 Cutaneous abscess of left foot: Secondary | ICD-10-CM | POA: Diagnosis not present

## 2020-12-23 DIAGNOSIS — E1151 Type 2 diabetes mellitus with diabetic peripheral angiopathy without gangrene: Secondary | ICD-10-CM | POA: Diagnosis not present

## 2020-12-23 DIAGNOSIS — T8469XA Infection and inflammatory reaction due to internal fixation device of other site, initial encounter: Secondary | ICD-10-CM | POA: Diagnosis not present

## 2020-12-23 DIAGNOSIS — E1142 Type 2 diabetes mellitus with diabetic polyneuropathy: Secondary | ICD-10-CM | POA: Diagnosis not present

## 2020-12-23 DIAGNOSIS — Z87891 Personal history of nicotine dependence: Secondary | ICD-10-CM | POA: Diagnosis not present

## 2020-12-23 DIAGNOSIS — Z79899 Other long term (current) drug therapy: Secondary | ICD-10-CM | POA: Diagnosis not present

## 2020-12-23 DIAGNOSIS — I70219 Atherosclerosis of native arteries of extremities with intermittent claudication, unspecified extremity: Secondary | ICD-10-CM | POA: Diagnosis not present

## 2020-12-23 DIAGNOSIS — I10 Essential (primary) hypertension: Secondary | ICD-10-CM | POA: Diagnosis not present

## 2020-12-23 DIAGNOSIS — Z7985 Long-term (current) use of injectable non-insulin antidiabetic drugs: Secondary | ICD-10-CM | POA: Diagnosis not present

## 2020-12-23 DIAGNOSIS — E785 Hyperlipidemia, unspecified: Secondary | ICD-10-CM | POA: Diagnosis not present

## 2020-12-23 DIAGNOSIS — Z7984 Long term (current) use of oral hypoglycemic drugs: Secondary | ICD-10-CM | POA: Diagnosis not present

## 2020-12-23 DIAGNOSIS — Z792 Long term (current) use of antibiotics: Secondary | ICD-10-CM | POA: Diagnosis not present

## 2020-12-23 DIAGNOSIS — E11610 Type 2 diabetes mellitus with diabetic neuropathic arthropathy: Secondary | ICD-10-CM | POA: Diagnosis not present

## 2020-12-23 DIAGNOSIS — Z7901 Long term (current) use of anticoagulants: Secondary | ICD-10-CM | POA: Diagnosis not present

## 2020-12-24 DIAGNOSIS — M869 Osteomyelitis, unspecified: Secondary | ICD-10-CM | POA: Diagnosis not present

## 2020-12-25 DIAGNOSIS — E11610 Type 2 diabetes mellitus with diabetic neuropathic arthropathy: Secondary | ICD-10-CM | POA: Diagnosis not present

## 2020-12-25 DIAGNOSIS — Z955 Presence of coronary angioplasty implant and graft: Secondary | ICD-10-CM | POA: Diagnosis not present

## 2020-12-25 DIAGNOSIS — I70219 Atherosclerosis of native arteries of extremities with intermittent claudication, unspecified extremity: Secondary | ICD-10-CM | POA: Diagnosis not present

## 2020-12-25 DIAGNOSIS — Z87891 Personal history of nicotine dependence: Secondary | ICD-10-CM | POA: Diagnosis not present

## 2020-12-25 DIAGNOSIS — Z792 Long term (current) use of antibiotics: Secondary | ICD-10-CM | POA: Diagnosis not present

## 2020-12-25 DIAGNOSIS — Z9181 History of falling: Secondary | ICD-10-CM | POA: Diagnosis not present

## 2020-12-25 DIAGNOSIS — Z7901 Long term (current) use of anticoagulants: Secondary | ICD-10-CM | POA: Diagnosis not present

## 2020-12-25 DIAGNOSIS — Z7985 Long-term (current) use of injectable non-insulin antidiabetic drugs: Secondary | ICD-10-CM | POA: Diagnosis not present

## 2020-12-25 DIAGNOSIS — E1142 Type 2 diabetes mellitus with diabetic polyneuropathy: Secondary | ICD-10-CM | POA: Diagnosis not present

## 2020-12-25 DIAGNOSIS — Z79899 Other long term (current) drug therapy: Secondary | ICD-10-CM | POA: Diagnosis not present

## 2020-12-25 DIAGNOSIS — Z7982 Long term (current) use of aspirin: Secondary | ICD-10-CM | POA: Diagnosis not present

## 2020-12-25 DIAGNOSIS — L02612 Cutaneous abscess of left foot: Secondary | ICD-10-CM | POA: Diagnosis not present

## 2020-12-25 DIAGNOSIS — Z7984 Long term (current) use of oral hypoglycemic drugs: Secondary | ICD-10-CM | POA: Diagnosis not present

## 2020-12-25 DIAGNOSIS — T8469XA Infection and inflammatory reaction due to internal fixation device of other site, initial encounter: Secondary | ICD-10-CM | POA: Diagnosis not present

## 2020-12-25 DIAGNOSIS — I10 Essential (primary) hypertension: Secondary | ICD-10-CM | POA: Diagnosis not present

## 2020-12-25 DIAGNOSIS — E1151 Type 2 diabetes mellitus with diabetic peripheral angiopathy without gangrene: Secondary | ICD-10-CM | POA: Diagnosis not present

## 2020-12-25 DIAGNOSIS — E785 Hyperlipidemia, unspecified: Secondary | ICD-10-CM | POA: Diagnosis not present

## 2020-12-25 DIAGNOSIS — Z89422 Acquired absence of other left toe(s): Secondary | ICD-10-CM | POA: Diagnosis not present

## 2020-12-27 DIAGNOSIS — T8189XA Other complications of procedures, not elsewhere classified, initial encounter: Secondary | ICD-10-CM | POA: Diagnosis not present

## 2020-12-27 DIAGNOSIS — S91302A Unspecified open wound, left foot, initial encounter: Secondary | ICD-10-CM | POA: Diagnosis not present

## 2020-12-28 DIAGNOSIS — S91302A Unspecified open wound, left foot, initial encounter: Secondary | ICD-10-CM | POA: Diagnosis not present

## 2020-12-28 DIAGNOSIS — I10 Essential (primary) hypertension: Secondary | ICD-10-CM | POA: Diagnosis not present

## 2020-12-28 DIAGNOSIS — A4902 Methicillin resistant Staphylococcus aureus infection, unspecified site: Secondary | ICD-10-CM | POA: Diagnosis not present

## 2020-12-28 DIAGNOSIS — L02612 Cutaneous abscess of left foot: Secondary | ICD-10-CM | POA: Diagnosis not present

## 2020-12-28 DIAGNOSIS — Z89422 Acquired absence of other left toe(s): Secondary | ICD-10-CM | POA: Diagnosis not present

## 2020-12-28 DIAGNOSIS — Z9181 History of falling: Secondary | ICD-10-CM | POA: Diagnosis not present

## 2020-12-28 DIAGNOSIS — Z792 Long term (current) use of antibiotics: Secondary | ICD-10-CM | POA: Diagnosis not present

## 2020-12-28 DIAGNOSIS — E11610 Type 2 diabetes mellitus with diabetic neuropathic arthropathy: Secondary | ICD-10-CM | POA: Diagnosis not present

## 2020-12-28 DIAGNOSIS — Z7984 Long term (current) use of oral hypoglycemic drugs: Secondary | ICD-10-CM | POA: Diagnosis not present

## 2020-12-28 DIAGNOSIS — E1151 Type 2 diabetes mellitus with diabetic peripheral angiopathy without gangrene: Secondary | ICD-10-CM | POA: Diagnosis not present

## 2020-12-28 DIAGNOSIS — T8469XA Infection and inflammatory reaction due to internal fixation device of other site, initial encounter: Secondary | ICD-10-CM | POA: Diagnosis not present

## 2020-12-28 DIAGNOSIS — Z87891 Personal history of nicotine dependence: Secondary | ICD-10-CM | POA: Diagnosis not present

## 2020-12-28 DIAGNOSIS — Z7901 Long term (current) use of anticoagulants: Secondary | ICD-10-CM | POA: Diagnosis not present

## 2020-12-28 DIAGNOSIS — Z79899 Other long term (current) drug therapy: Secondary | ICD-10-CM | POA: Diagnosis not present

## 2020-12-28 DIAGNOSIS — I70219 Atherosclerosis of native arteries of extremities with intermittent claudication, unspecified extremity: Secondary | ICD-10-CM | POA: Diagnosis not present

## 2020-12-28 DIAGNOSIS — E785 Hyperlipidemia, unspecified: Secondary | ICD-10-CM | POA: Diagnosis not present

## 2020-12-28 DIAGNOSIS — E1142 Type 2 diabetes mellitus with diabetic polyneuropathy: Secondary | ICD-10-CM | POA: Diagnosis not present

## 2020-12-28 DIAGNOSIS — E11621 Type 2 diabetes mellitus with foot ulcer: Secondary | ICD-10-CM | POA: Diagnosis not present

## 2020-12-28 DIAGNOSIS — Z7985 Long-term (current) use of injectable non-insulin antidiabetic drugs: Secondary | ICD-10-CM | POA: Diagnosis not present

## 2020-12-28 DIAGNOSIS — Z955 Presence of coronary angioplasty implant and graft: Secondary | ICD-10-CM | POA: Diagnosis not present

## 2020-12-28 DIAGNOSIS — Z7982 Long term (current) use of aspirin: Secondary | ICD-10-CM | POA: Diagnosis not present

## 2020-12-28 DIAGNOSIS — T8189XA Other complications of procedures, not elsewhere classified, initial encounter: Secondary | ICD-10-CM | POA: Diagnosis not present

## 2020-12-29 DIAGNOSIS — S91302A Unspecified open wound, left foot, initial encounter: Secondary | ICD-10-CM | POA: Diagnosis not present

## 2020-12-29 DIAGNOSIS — T8189XA Other complications of procedures, not elsewhere classified, initial encounter: Secondary | ICD-10-CM | POA: Diagnosis not present

## 2020-12-30 DIAGNOSIS — T8189XA Other complications of procedures, not elsewhere classified, initial encounter: Secondary | ICD-10-CM | POA: Diagnosis not present

## 2020-12-30 DIAGNOSIS — E11628 Type 2 diabetes mellitus with other skin complications: Secondary | ICD-10-CM | POA: Diagnosis not present

## 2020-12-30 DIAGNOSIS — S98132A Complete traumatic amputation of one left lesser toe, initial encounter: Secondary | ICD-10-CM | POA: Diagnosis not present

## 2020-12-30 DIAGNOSIS — S91302A Unspecified open wound, left foot, initial encounter: Secondary | ICD-10-CM | POA: Diagnosis not present

## 2020-12-30 DIAGNOSIS — L089 Local infection of the skin and subcutaneous tissue, unspecified: Secondary | ICD-10-CM | POA: Diagnosis not present

## 2020-12-30 DIAGNOSIS — E11610 Type 2 diabetes mellitus with diabetic neuropathic arthropathy: Secondary | ICD-10-CM | POA: Diagnosis not present

## 2021-01-02 DIAGNOSIS — A4902 Methicillin resistant Staphylococcus aureus infection, unspecified site: Secondary | ICD-10-CM | POA: Diagnosis not present

## 2021-01-04 ENCOUNTER — Ambulatory Visit: Payer: Medicare Other | Admitting: Family Medicine

## 2021-01-06 ENCOUNTER — Other Ambulatory Visit: Payer: Self-pay | Admitting: Family Medicine

## 2021-01-06 DIAGNOSIS — E785 Hyperlipidemia, unspecified: Secondary | ICD-10-CM

## 2021-01-12 DIAGNOSIS — H52223 Regular astigmatism, bilateral: Secondary | ICD-10-CM | POA: Diagnosis not present

## 2021-01-12 DIAGNOSIS — Z89422 Acquired absence of other left toe(s): Secondary | ICD-10-CM | POA: Diagnosis not present

## 2021-01-12 DIAGNOSIS — M14672 Charcot's joint, left ankle and foot: Secondary | ICD-10-CM | POA: Diagnosis not present

## 2021-01-12 DIAGNOSIS — B351 Tinea unguium: Secondary | ICD-10-CM | POA: Diagnosis not present

## 2021-01-12 DIAGNOSIS — L97322 Non-pressure chronic ulcer of left ankle with fat layer exposed: Secondary | ICD-10-CM | POA: Diagnosis not present

## 2021-01-12 DIAGNOSIS — E113293 Type 2 diabetes mellitus with mild nonproliferative diabetic retinopathy without macular edema, bilateral: Secondary | ICD-10-CM | POA: Diagnosis not present

## 2021-01-12 DIAGNOSIS — H524 Presbyopia: Secondary | ICD-10-CM | POA: Diagnosis not present

## 2021-01-12 LAB — HM DIABETES EYE EXAM

## 2021-01-14 DIAGNOSIS — S98132A Complete traumatic amputation of one left lesser toe, initial encounter: Secondary | ICD-10-CM | POA: Diagnosis not present

## 2021-01-21 ENCOUNTER — Other Ambulatory Visit: Payer: Self-pay

## 2021-01-21 ENCOUNTER — Telehealth (INDEPENDENT_AMBULATORY_CARE_PROVIDER_SITE_OTHER): Payer: Medicare Other | Admitting: Family Medicine

## 2021-01-21 ENCOUNTER — Encounter: Payer: Self-pay | Admitting: Family Medicine

## 2021-01-21 DIAGNOSIS — E1142 Type 2 diabetes mellitus with diabetic polyneuropathy: Secondary | ICD-10-CM

## 2021-01-21 DIAGNOSIS — E11628 Type 2 diabetes mellitus with other skin complications: Secondary | ICD-10-CM

## 2021-01-21 DIAGNOSIS — I10 Essential (primary) hypertension: Secondary | ICD-10-CM

## 2021-01-21 DIAGNOSIS — E785 Hyperlipidemia, unspecified: Secondary | ICD-10-CM | POA: Diagnosis not present

## 2021-01-21 DIAGNOSIS — L089 Local infection of the skin and subcutaneous tissue, unspecified: Secondary | ICD-10-CM

## 2021-01-21 DIAGNOSIS — E119 Type 2 diabetes mellitus without complications: Secondary | ICD-10-CM

## 2021-01-21 MED ORDER — OZEMPIC (1 MG/DOSE) 4 MG/3ML ~~LOC~~ SOPN: PEN_INJECTOR | SUBCUTANEOUS | 3 refills | Status: DC

## 2021-01-21 MED ORDER — METOPROLOL SUCCINATE ER 50 MG PO TB24: ORAL_TABLET | ORAL | 1 refills | Status: DC

## 2021-01-21 MED ORDER — METFORMIN HCL 1000 MG PO TABS
1000.0000 mg | ORAL_TABLET | Freq: Two times a day (BID) | ORAL | 1 refills | Status: DC
Start: 2021-01-21 — End: 2021-08-05

## 2021-01-21 NOTE — Assessment & Plan Note (Signed)
He will continue to follow with infectious disease and his surgeon.

## 2021-01-21 NOTE — Progress Notes (Signed)
Virtual Visit via telephone Note  This visit type was conducted due to national recommendations for restrictions regarding the COVID-19 pandemic (e.g. social distancing).  This format is felt to be most appropriate for this patient at this time.  All issues noted in this document were discussed and addressed.  No physical exam was performed (except for noted visual exam findings with Video Visits).   I connected with Joseph Singh today at 10:00 AM EST by telephone and verified that I am speaking with the correct person using two identifiers. Location patient: home Location provider: home office Persons participating in the virtual visit: patient, provider  I discussed the limitations, risks, security and privacy concerns of performing an evaluation and management service by telephone and the availability of in person appointments. I also discussed with the patient that there may be a patient responsible charge related to this service. The patient expressed understanding and agreed to proceed.  Interactive audio and video telecommunications were attempted between this provider and patient, however failed, due to patient having technical difficulties OR patient did not have access to video capability.  We continued and completed visit with audio only.   Reason for visit: f/u  HPI: HYPERTENSION Disease Monitoring Home BP Monitoring 120/70 Chest pain- no    Dyspnea- no Medications Compliance-  taking amlodipine, lisinopril/HCTZ, metoprolol.  Edema- no BMET    Component Value Date/Time   NA 134 (L) 11/19/2020 0809   K 4.2 11/19/2020 0809   CL 93 (L) 11/19/2020 0809   CO2 31 11/19/2020 0809   GLUCOSE 184 (H) 11/19/2020 0809   BUN 13 11/19/2020 0809   CREATININE 0.80 11/19/2020 0809   CREATININE 1.15 09/30/2016 1526   CALCIUM 9.5 11/19/2020 0809   GFRNONAA >60 02/01/2019 1559   GFRAA >60 02/01/2019 1559   DIABETES Disease Monitoring: Blood Sugar ranges-not checking, A1c of 6.8 on  9/30 Polyuria/phagia/dipsia- no      Optho- UTD Medications: Compliance- taking glipizide, jardiance, metformin, ozempic Hypoglycemic symptoms- no  The: Patient notes this is generally well controlled.  The tramadol and gabapentin help ease the discomfort.  Notes most of his symptoms are in his fingers.  No drowsiness with these medications.  Diabetic foot infection: Patient has undergone several surgeries for this.  He had a PICC line and received IV antibiotics though he is now on oral Keflex.  He follows with infectious disease and a surgeon for this.  He reports the foot is healing quite well.  Hyperlipidemia: Patient takes Lipitor.  No reported pain or myalgias.  ROS: See pertinent positives and negatives per HPI.  Past Medical History:  Diagnosis Date   Arthritis    Diabetes mellitus without complication ([place])    History of kidney stones    h/o   Hyperlipidemia    Hypertension    Neuropathy     Past Surgical History:  Procedure Laterality Date   AMPUTATION TOE Left 08/10/2018   Procedure: RAY LEFT;  Surgeon: Samara Deist, DPM;  Location: [facility];  Service: Podiatry;  Laterality: Left;   ARTHRODESIS METATARSAL Left 02/01/2019   Procedure: ARTHRODESIS METATARSAL;  Surgeon: Samara Deist, DPM;  Location: [facility];  Service: Podiatry;  Laterality: Left;   FOOT ARTHRODESIS Left 02/01/2019   Procedure: ARTHRODESIS FOOT;STJ;  Surgeon: Samara Deist, DPM;  Location: [facility];  Service: Podiatry;  Laterality: Left;   IRRIGATION AND DEBRIDEMENT FOOT Left 10/26/2018   Procedure: IRRIGATION AND DEBRIDEMENT FOOT, LEFT;  Surgeon: Samara Deist, DPM;  Location: [facility];  Service:  Podiatry;  Laterality: Left;   LOWER EXTREMITY ANGIOGRAPHY Right 02/25/2016   Procedure: Lower Extremity Angiography;  Surgeon: Algernon Huxley, MD;  Location: [place] [facility];  Service: Cardiovascular;  Laterality: Right;   LOWER EXTREMITY ANGIOGRAPHY Right 02/27/2017   Procedure: LOWER EXTREMITY ANGIOGRAPHY;   Surgeon: Algernon Huxley, MD;  Location: [place] [facility];  Service: Cardiovascular;  Laterality: Right;   LOWER EXTREMITY ANGIOGRAPHY Left 08/06/2018   Procedure: LOWER EXTREMITY ANGIOGRAPHY;  Surgeon: Algernon Huxley, MD;  Location: [place] [facility];  Service: Cardiovascular;  Laterality: Left;   LOWER EXTREMITY INTERVENTION  02/25/2016   Procedure: Lower Extremity Intervention;  Surgeon: Algernon Huxley, MD;  Location: [place] [facility];  Service: Cardiovascular;;   TOE AMPUTATION      Family History  Problem Relation Age of Onset   Diabetes Mother    Heart disease Father    Diabetes Sister    Diabetes Brother    Cancer Sister        lung   Cancer Sister        breast    SOCIAL HX: former smoker   Current Outpatient Medications:    amLODipine (NORVASC) 10 MG tablet, TAKE 1 TABLET BY MOUTH DAILY, Disp: 90 tablet, Rfl: 3   aspirin EC 81 MG tablet, Take 1 tablet (81 mg total) by mouth daily., Disp: 150 tablet, Rfl: 2   atorvastatin (LIPITOR) 80 MG tablet, TAKE ONE TABLET BY MOUTH EVERY DAY, Disp: 90 tablet, Rfl: 1   blood glucose meter kit and supplies KIT, E11.51, check 2x daily, dispense based on patient and insurance preference, Disp: 1 each, Rfl: 0   clopidogrel (PLAVIX) 75 MG tablet, TAKE ONE TABLET BY MOUTH EVERY DAY, Disp: 90 tablet, Rfl: 3   gabapentin (NEURONTIN) 300 MG capsule, TAKE 1 CAPSULE BY MOUTH FOUR TIMES DAILY, Disp: 360 capsule, Rfl: 2   glipiZIDE (GLUCOTROL XL) 10 MG 24 hr tablet, TAKE ONE TABLET BY MOUTH EACH DAY WITH BREAKFAST, Disp: 90 tablet, Rfl: 3   glucose blood (ONE TOUCH ULTRA TEST) test strip, Check blood sugar twice daily Dx code: E11.9, Disp: 200 each, Rfl: 0   JARDIANCE 25 MG TABS tablet, TAKE 1 TABLET BY MOUTH ONCE DAILY, Disp: 90 tablet, Rfl: 3   lisinopril-hydrochlorothiazide (ZESTORETIC) 10-12.5 MG tablet, TAKE 1 TABLET BY MOUTH DAILY, Disp: 90 tablet, Rfl: 3   traMADol (ULTRAM) 50 MG tablet, TAKE ONE TABLET BY MOUTH EVERY 8 HOURS AS NEEDED FOR  MODERATE PAIN, Disp: 90 tablet, Rfl: 0   metFORMIN (GLUCOPHAGE) 1000 MG tablet, Take 1 tablet (1,000 mg total) by mouth 2 (two) times daily with a meal., Disp: 90 tablet, Rfl: 1   metoprolol succinate (TOPROL-XL) 50 MG 24 hr tablet, TAKE 1 TABLET BY MOUTH ONCE DAILY WITH OR IMMEDIATLEY FOLLOWING A MEAL, Disp: 90 tablet, Rfl: 1   Semaglutide, 1 MG/DOSE, (OZEMPIC, 1 MG/DOSE,) 4 MG/3ML SOPN, INJECT $RemoveBefo'1MG'[id number]$  (0.75ML) SUBCUTANEOUSLY ONCE PER WEEK, Disp: 9 mL, Rfl: 3  EXAM: This was a telephone visit and thus no exam was completed.  ASSESSMENT AND PLAN:  Discussed the following assessment and plan:  Problem List Items Addressed This Visit     Diabetic foot infection ([place])    He will continue to follow with infectious disease and his surgeon.      Relevant Medications   metFORMIN (GLUCOPHAGE) 1000 MG tablet   Semaglutide, 1 MG/DOSE, (OZEMPIC, 1 MG/DOSE,) 4 MG/3ML SOPN   Diabetic neuropathy [name]    Chronic issue.  Adequately controlled.  He will continue gabapentin and tramadol.      Relevant Medications   metFORMIN (GLUCOPHAGE) 1000 MG tablet   Semaglutide, 1 MG/DOSE, (OZEMPIC, 1 MG/DOSE,) 4 MG/3ML SOPN   Essential hypertension    Well-controlled.  He will continue metoprolol 50 mg once daily, lisinopril-HCTZ 1 tablet daily, and amlodipine 10 mg daily.      Relevant Medications   metoprolol succinate (TOPROL-XL) 50 MG 24 hr tablet   Hyperlipidemia    Continue Lipitor 80 mg once daily.      Relevant Medications   metoprolol succinate (TOPROL-XL) 50 MG 24 hr tablet   Type 2 diabetes mellitus without complication, without long-term current use of insulin [name]    Well-controlled.  A1c is up-to-date.  He will continue glipizide 10 mg daily, Jardiance 25 mg daily, metformin 1000 mg twice daily, and Ozempic 1 mg weekly.      Relevant Medications   metFORMIN (GLUCOPHAGE) 1000 MG tablet   Semaglutide, 1 MG/DOSE, (OZEMPIC, 1 MG/DOSE,) 4 MG/3ML SOPN    The patient was encouraged to get  the flu vaccine.  Return in about 3 months (around 04/21/2021) for DM.   I discussed the assessment and treatment plan with the patient. The patient was provided an opportunity to ask questions and all were answered. The patient agreed with the plan and demonstrated an understanding of the instructions.   The patient was advised to call back or seek an in-person evaluation if the symptoms worsen or if the condition fails to improve as anticipated.  I provided 12 minutes of non-face-to-face time during this encounter.   [name] Rumps, MD

## 2021-01-21 NOTE — Assessment & Plan Note (Signed)
Continue Lipitor 80 mg once daily. 

## 2021-01-21 NOTE — Assessment & Plan Note (Signed)
Well-controlled.  He will continue metoprolol 50 mg once daily, lisinopril-HCTZ 1 tablet daily, and amlodipine 10 mg daily.

## 2021-01-21 NOTE — Assessment & Plan Note (Signed)
Chronic issue.  Adequately controlled.  He will continue gabapentin and tramadol.

## 2021-01-21 NOTE — Assessment & Plan Note (Signed)
Well-controlled.  A1c is up-to-date.  He will continue glipizide 10 mg daily, Jardiance 25 mg daily, metformin 1000 mg twice daily, and Ozempic 1 mg weekly.

## 2021-01-26 DIAGNOSIS — M14672 Charcot's joint, left ankle and foot: Secondary | ICD-10-CM | POA: Diagnosis not present

## 2021-02-01 DIAGNOSIS — S98132A Complete traumatic amputation of one left lesser toe, initial encounter: Secondary | ICD-10-CM | POA: Diagnosis not present

## 2021-02-01 DIAGNOSIS — E119 Type 2 diabetes mellitus without complications: Secondary | ICD-10-CM | POA: Diagnosis not present

## 2021-02-01 DIAGNOSIS — L089 Local infection of the skin and subcutaneous tissue, unspecified: Secondary | ICD-10-CM | POA: Diagnosis not present

## 2021-02-01 DIAGNOSIS — E11610 Type 2 diabetes mellitus with diabetic neuropathic arthropathy: Secondary | ICD-10-CM | POA: Diagnosis not present

## 2021-02-01 DIAGNOSIS — E11628 Type 2 diabetes mellitus with other skin complications: Secondary | ICD-10-CM | POA: Diagnosis not present

## 2021-02-04 ENCOUNTER — Telehealth: Payer: Self-pay | Admitting: Family Medicine

## 2021-02-04 NOTE — Telephone Encounter (Signed)
Cara from [employer] called in regards to pts order for diabetic shoes. She faxed over a request for the most recent visit notes showing diabetic management. She is needing it ASAP due to insurance reasons for pt.   Charlynne Pander can be reached at: [phone] Fax: [phone]

## 2021-02-08 ENCOUNTER — Other Ambulatory Visit: Payer: Self-pay | Admitting: Family Medicine

## 2021-02-08 DIAGNOSIS — E1142 Type 2 diabetes mellitus with diabetic polyneuropathy: Secondary | ICD-10-CM

## 2021-02-08 NOTE — Telephone Encounter (Signed)
RX Refill: tramadol Last Seen: 01-21-21 Last Ordered: 12-11-20 Next Appt: NA

## 2021-02-08 NOTE — Telephone Encounter (Signed)
Fax sent and confirmation given.  Britni Driscoll,cma  

## 2021-02-13 DIAGNOSIS — S98132A Complete traumatic amputation of one left lesser toe, initial encounter: Secondary | ICD-10-CM | POA: Diagnosis not present

## 2021-02-25 DIAGNOSIS — M14672 Charcot's joint, left ankle and foot: Secondary | ICD-10-CM | POA: Diagnosis not present

## 2021-02-25 DIAGNOSIS — Z4789 Encounter for other orthopedic aftercare: Secondary | ICD-10-CM | POA: Diagnosis not present

## 2021-03-09 ENCOUNTER — Other Ambulatory Visit: Payer: Self-pay | Admitting: Family Medicine

## 2021-03-09 DIAGNOSIS — E1142 Type 2 diabetes mellitus with diabetic polyneuropathy: Secondary | ICD-10-CM

## 2021-03-16 DIAGNOSIS — S98132A Complete traumatic amputation of one left lesser toe, initial encounter: Secondary | ICD-10-CM | POA: Diagnosis not present

## 2021-03-23 DIAGNOSIS — M14672 Charcot's joint, left ankle and foot: Secondary | ICD-10-CM | POA: Diagnosis not present

## 2021-04-07 DIAGNOSIS — M14672 Charcot's joint, left ankle and foot: Secondary | ICD-10-CM | POA: Diagnosis not present

## 2021-04-12 ENCOUNTER — Telehealth: Payer: Self-pay | Admitting: Family

## 2021-04-12 DIAGNOSIS — E1142 Type 2 diabetes mellitus with diabetic polyneuropathy: Secondary | ICD-10-CM

## 2021-04-12 MED ORDER — TRAMADOL HCL 50 MG PO TABS: ORAL_TABLET | ORAL | 0 refills | Status: DC

## 2021-04-12 NOTE — Telephone Encounter (Signed)
Sent to pharmacy 

## 2021-04-12 NOTE — Telephone Encounter (Signed)
Pt need refill on this medication. Pt is out.

## 2021-04-12 NOTE — Addendum Note (Signed)
Addended by: Glori Luis on: 04/12/2021 02:21 PM   Modules accepted: Orders

## 2021-04-16 DIAGNOSIS — S98132A Complete traumatic amputation of one left lesser toe, initial encounter: Secondary | ICD-10-CM | POA: Diagnosis not present

## 2021-05-05 DIAGNOSIS — Z4789 Encounter for other orthopedic aftercare: Secondary | ICD-10-CM | POA: Diagnosis not present

## 2021-05-05 DIAGNOSIS — M14672 Charcot's joint, left ankle and foot: Secondary | ICD-10-CM | POA: Diagnosis not present

## 2021-05-10 ENCOUNTER — Other Ambulatory Visit: Payer: Self-pay | Admitting: Family

## 2021-05-10 DIAGNOSIS — E1142 Type 2 diabetes mellitus with diabetic polyneuropathy: Secondary | ICD-10-CM

## 2021-05-11 ENCOUNTER — Other Ambulatory Visit: Payer: Self-pay | Admitting: Family Medicine

## 2021-05-11 DIAGNOSIS — E119 Type 2 diabetes mellitus without complications: Secondary | ICD-10-CM

## 2021-05-11 DIAGNOSIS — E1142 Type 2 diabetes mellitus with diabetic polyneuropathy: Secondary | ICD-10-CM

## 2021-05-14 DIAGNOSIS — S98132A Complete traumatic amputation of one left lesser toe, initial encounter: Secondary | ICD-10-CM | POA: Diagnosis not present

## 2021-05-22 ENCOUNTER — Other Ambulatory Visit: Payer: Self-pay | Admitting: Family Medicine

## 2021-05-22 DIAGNOSIS — I10 Essential (primary) hypertension: Secondary | ICD-10-CM

## 2021-06-10 ENCOUNTER — Other Ambulatory Visit: Payer: Self-pay | Admitting: Family Medicine

## 2021-06-10 DIAGNOSIS — E1142 Type 2 diabetes mellitus with diabetic polyneuropathy: Secondary | ICD-10-CM

## 2021-06-21 DIAGNOSIS — E1143 Type 2 diabetes mellitus with diabetic autonomic (poly)neuropathy: Secondary | ICD-10-CM | POA: Diagnosis not present

## 2021-06-21 DIAGNOSIS — M14672 Charcot's joint, left ankle and foot: Secondary | ICD-10-CM | POA: Diagnosis not present

## 2021-06-22 ENCOUNTER — Ambulatory Visit (INDEPENDENT_AMBULATORY_CARE_PROVIDER_SITE_OTHER): Payer: Medicare Other

## 2021-06-22 VITALS — Ht 76.0 in | Wt 298.0 lb

## 2021-06-22 DIAGNOSIS — Z0000 Encounter for general adult medical examination without abnormal findings: Secondary | ICD-10-CM | POA: Diagnosis not present

## 2021-06-22 NOTE — Progress Notes (Signed)
Subjective:   Joseph Singh is a 61 y.o. male who presents for Medicare Annual/Subsequent preventive examination.  Review of Systems    No ROS.  Medicare Wellness Virtual Visit.  Visual/audio telehealth visit, UTA vital signs.   See social history for additional risk factors.   Cardiac Risk Factors include: advanced age (>38men, >53 women);diabetes mellitus;hypertension;male gender     Objective:    Today's Vitals   06/22/21 0833  Weight: 298 lb (135.2 kg)  Height: 6\' 4"  (1.93 m)   Body mass index is 36.27 kg/m.     06/22/2021    8:51 AM 06/18/2020    9:11 AM 06/18/2019    9:09 AM 02/01/2019    6:08 AM 01/29/2019    2:29 PM 10/25/2018    8:50 PM 10/25/2018    8:40 PM  Advanced Directives  Does Patient Have a Medical Advance Directive? No No No No No  No  Would patient like information on creating a medical advance directive? No - Patient declined No - Patient declined No - Patient declined No - Patient declined No - Patient declined No - Patient declined    Current Medications (verified) Outpatient Encounter Medications as of 06/22/2021  Medication Sig   amLODipine (NORVASC) 10 MG tablet TAKE 1 TABLET BY MOUTH DAILY   aspirin EC 81 MG tablet Take 1 tablet (81 mg total) by mouth daily.   atorvastatin (LIPITOR) 80 MG tablet TAKE ONE TABLET BY MOUTH EVERY DAY   blood glucose meter kit and supplies KIT E11.51, check 2x daily, dispense based on patient and insurance preference   clopidogrel (PLAVIX) 75 MG tablet TAKE ONE TABLET BY MOUTH EVERY DAY   gabapentin (NEURONTIN) 300 MG capsule TAKE 1 CAPSULE BY MOUTH FOUR TIMES DAILY   glipiZIDE (GLUCOTROL XL) 10 MG 24 hr tablet TAKE ONE TABLET BY MOUTH EACH DAY WITH BREAKFAST   glucose blood (ONE TOUCH ULTRA TEST) test strip Check blood sugar twice daily Dx code: E11.9   JARDIANCE 25 MG TABS tablet TAKE 1 TABLET BY MOUTH ONCE DAILY   lisinopril-hydrochlorothiazide (ZESTORETIC) 10-12.5 MG tablet TAKE 1 TABLET BY MOUTH DAILY   metFORMIN  (GLUCOPHAGE) 1000 MG tablet Take 1 tablet (1,000 mg total) by mouth 2 (two) times daily with a meal.   metoprolol succinate (TOPROL-XL) 50 MG 24 hr tablet TAKE ONE TABLET BY MOUTH ONCE DAILY WITHOR IMMEDIATELY FOLLOWING A MEAL   Semaglutide, 1 MG/DOSE, (OZEMPIC, 1 MG/DOSE,) 4 MG/3ML SOPN INJECT 1MG  (0.75ML) SUBCUTANEOUSLY ONCE PER WEEK   traMADol (ULTRAM) 50 MG tablet TAKE 1 TABLET BY MOUTH EVERY 8 HOURS AS NEEDED FOR MODERATE PAIN   No facility-administered encounter medications on file as of 06/22/2021.   Allergies (verified) Patient has no known allergies.   History: Past Medical History:  Diagnosis Date   Arthritis    Diabetes mellitus without complication [name]    History of kidney stones    h/o   Hyperlipidemia    Hypertension    Neuropathy    Past Surgical History:  Procedure Laterality Date   AMPUTATION TOE Left 08/10/2018   Procedure: RAY LEFT;  Surgeon: Gwyneth Revels, DPM;  Location: [facility];  Service: Podiatry;  Laterality: Left;   ARTHRODESIS METATARSAL Left 02/01/2019   Procedure: ARTHRODESIS METATARSAL;  Surgeon: Gwyneth Revels, DPM;  Location: [facility];  Service: Podiatry;  Laterality: Left;   FOOT ARTHRODESIS Left 02/01/2019   Procedure: ARTHRODESIS FOOT;STJ;  Surgeon: Gwyneth Revels, DPM;  Location: [facility];  Service: Podiatry;  Laterality: Left;  IRRIGATION AND DEBRIDEMENT FOOT Left 10/26/2018   Procedure: IRRIGATION AND DEBRIDEMENT FOOT, LEFT;  Surgeon: Gwyneth Revels, DPM;  Location: [facility];  Service: Podiatry;  Laterality: Left;   LOWER EXTREMITY ANGIOGRAPHY Right 02/25/2016   Procedure: Lower Extremity Angiography;  Surgeon: Annice Needy, MD;  Location: [facility];  Service: Cardiovascular;  Laterality: Right;   LOWER EXTREMITY ANGIOGRAPHY Right 02/27/2017   Procedure: LOWER EXTREMITY ANGIOGRAPHY;  Surgeon: Annice Needy, MD;  Location: [facility];  Service: Cardiovascular;  Laterality: Right;   LOWER EXTREMITY ANGIOGRAPHY Left 08/06/2018    Procedure: LOWER EXTREMITY ANGIOGRAPHY;  Surgeon: Annice Needy, MD;  Location: [facility];  Service: Cardiovascular;  Laterality: Left;   LOWER EXTREMITY INTERVENTION  02/25/2016   Procedure: Lower Extremity Intervention;  Surgeon: Annice Needy, MD;  Location: [facility];  Service: Cardiovascular;;   TOE AMPUTATION     Family History  Problem Relation Age of Onset   Diabetes Mother    Heart disease Father    Diabetes Sister    Diabetes Brother    Cancer Sister        lung   Cancer Sister        breast   Social History   Socioeconomic History   Marital status: Married    Spouse name: Not on file   Number of children: Not on file   Years of education: Not on file   Highest education level: Not on file  Occupational History   Not on file  Tobacco Use   Smoking status: Former    Packs/day: 0.50    Years: 35.00    Pack years: 17.50    Types: Cigarettes    Quit date: 08/09/2018    Years since quitting: 2.8   Smokeless tobacco: Never  Vaping Use   Vaping Use: Never used  Substance and Sexual Activity   Alcohol use: No    Alcohol/week: 0.0 standard drinks   Drug use: No   Sexual activity: Yes    Partners: Female    Comment: Wife  Other Topics Concern   Not on file  Social History Narrative   Permanently disabled   [place] on the job and hurt his shoulder    Lives with wife and 2 children    1 grandchild from a previous marriage    Pets: Not inside    GED    Right handed    Caffeine- 2-3 sodas, tea, no coffee    Enjoys watching tv and being outside    Social Determinants of Corporate investment banker Strain: Low Risk    Difficulty of Paying Living Expenses: Not hard at all  Food Insecurity: No Food Insecurity   Worried About Programme researcher, broadcasting/film/video in the Last Year: Never true   Barista in the Last Year: Never true  Transportation Needs: No Transportation Needs   Lack of Transportation (Medical): No   Lack of Transportation (Non-Medical): No   Physical Activity: Insufficiently Active   Days of Exercise per Week: 3 days   Minutes of Exercise per Session: 20 min  Stress: No Stress Concern Present   Feeling of Stress : Not at all  Social Connections: Unknown   Frequency of Communication with Friends and Family: Not on file   Frequency of Social Gatherings with Friends and Family: Not on file   Attends Religious Services: Not on file   Active Member of Clubs or Organizations: Not on  file   Attends Banker Meetings: Not on file   Marital Status: Married    Tobacco Counseling Counseling given: Not Answered   Clinical Intake:  Pre-visit preparation completed: Yes        Diabetes: Yes (Followed by PCP)  How often do you need to have someone help you when you read instructions, pamphlets, or other written materials from your doctor or pharmacy?: 1 - Never  Interpreter Needed?: No    Activities of Daily Living    06/22/2021    8:39 AM  In your present state of health, do you have any difficulty performing the following activities:  Hearing? 0  Vision? 0  Difficulty concentrating or making decisions? 0  Walking or climbing stairs? 1  Comment walker/cane in use  Dressing or bathing? 0  Doing errands, shopping? 0  Preparing Food and eating ? N  Using the Toilet? N  In the past six months, have you accidently leaked urine? N  Do you have problems with loss of bowel control? N  Managing your Medications? N  Managing your Finances? Y  Comment Wife assist  Housekeeping or managing your Housekeeping? N   Patient Care Team: Glori Luis, MD as PCP - General (Family Medicine)  Indicate any recent Medical Services you may have received from other than [facility] providers in the past year (date may be approximate).     Assessment:   This is a routine wellness examination for [facility].  Virtual Visit via Telephone Note  I connected with  [name] on 06/22/21 at  8:15 AM EDT by telephone and  verified that I am speaking with the correct person using two identifiers.  Persons participating in the virtual visit: patient/Nurse Health Advisor   I discussed the limitations of performing an evaluation and management service by telehealth. The patient expressed understanding and agreed to proceed. We continued and completed visit with audio only. Some vital signs may be absent or patient reported.   Hearing/Vision screen Hearing Screening - Comments:: Patient is able to hear conversational tones without difficulty.  No issues reported. Vision Screening - Comments:: Reports diabetic eye exam complete with [facility] within the last 12 months.   Dietary issues and exercise activities discussed: Current Exercise Habits: Home exercise routine, Type of exercise: walking, Time (Minutes): 30, Frequency (Times/Week): 5, Weekly Exercise (Minutes/Week): 150, Intensity: Mild Regular diet Good water intake   Goals Addressed               This Visit's Progress     Patient Stated     DIET - INCREASE WATER INTAKE (pt-stated)        Less soda.      Weight (lb) < 298 lb (135.2 kg) (pt-stated)   298 lb (135.2 kg)     I want to lose about 50lb Reduce sugar intake Monitor diet Stay active       Depression Screen    06/22/2021    8:37 AM 01/21/2021   10:02 AM 06/18/2020    9:07 AM 03/10/2020   10:00 AM 12/09/2019    9:07 AM 09/03/2019    8:34 AM 06/18/2019    9:12 AM  PHQ 2/9 Scores  PHQ - 2 Score 0 0 0 0 0 0 0    Fall Risk    06/22/2021    8:39 AM 01/21/2021   10:02 AM 06/18/2020    9:09 AM 03/10/2020   10:00 AM 12/09/2019    9:04 AM  Fall Risk   Falls in the past year? 0 0 0 0 0  Number falls in past yr: 0 0 0 0 0  Injury with Fall?  0 0    Risk for fall due to :  No Fall Risks     Follow up Falls evaluation completed Falls evaluation completed Falls evaluation completed Falls evaluation completed Falls evaluation completed    FALL RISK PREVENTION PERTAINING TO THE  HOME: Home free of loose throw rugs in walkways, pet beds, electrical cords, etc? Yes  Adequate lighting in your home to reduce risk of falls? Yes   ASSISTIVE DEVICES UTILIZED TO PREVENT FALLS: Life alert? No  Use of a cane, walker or w/c? Yes  Grab bars in the bathroom? Yes  Shower chair or bench in shower? Yes  Elevated toilet seat or a handicapped toilet? Yes   TIMED UP AND GO: Was the test performed? No .   Cognitive Function: Patient is alert and oriented x3.    01/19/2017    9:44 AM  MMSE - Mini Mental State Exam  Orientation to time 5  Orientation to Place 5  Registration 3  Attention/ Calculation 5  Recall 3  Language- name 2 objects 2  Language- repeat 1  Language- follow 3 step command 3  Language- read & follow direction 1  Write a sentence 1  Copy design 1  Total score 30        06/18/2020    9:10 AM 06/18/2019    9:20 AM 01/29/2018    9:32 AM  6CIT Screen  What Year? 0 points 0 points 0 points  What month? 0 points 0 points 0 points  What time? 0 points 0 points 0 points  Count back from 20 0 points 0 points 0 points  Months in reverse 0 points 0 points 0 points  Repeat phrase 0 points    Total Score 0 points      Immunizations Immunization History  Administered Date(s) Administered   Influenza,inj,Quad PF,6+ Mos 12/11/2017, 10/22/2018   Shingrix Completed?: No.    Education has been provided regarding the importance of this vaccine. Patient has been advised to call insurance company to determine out of pocket expense if they have not yet received this vaccine. Advised may also receive vaccine at local pharmacy or Health Dept. Verbalized acceptance and understanding.  Screening Tests Health Maintenance  Topic Date Due   HEMOGLOBIN A1C  06/30/2021 (Originally 04/04/2021)   COLONOSCOPY (Pts 45-15yrs Insurance coverage will need to be confirmed)  06/30/2021 (Originally 02/13/2019)   Zoster Vaccines- Shingrix (1 of 2) 09/22/2021 (Originally  02/12/1980)   INFLUENZA VACCINE  09/21/2021   OPHTHALMOLOGY EXAM  01/12/2022   FOOT EXAM  06/22/2022   TETANUS/TDAP  02/23/2025   Hepatitis C Screening  Completed   HIV Screening  Completed   HPV VACCINES  Aged Out   COVID-19 Vaccine  Discontinued   Health Maintenance There are no preventive care reminders to display for this patient.  Colonoscopy- deferred per patient.   Vision Screening: Recommended annual ophthalmology exams for early detection of glaucoma and other disorders of the eye.  Dental Screening: Recommended annual dental exams for proper oral hygiene  Community Resource Referral / Chronic Care Management: CRR required this visit?  No   CCM required this visit?  No      Plan:   Keep all routine maintenance appointments.   I have personally reviewed and noted the following in the patient's chart:   Medical  and social history Use of alcohol, tobacco or illicit drugs  Current medications and supplements including opioid prescriptions. Patient is not currently taking opioid prescriptions. Functional ability and status Nutritional status Physical activity Advanced directives List of other physicians Hospitalizations, surgeries, and ER visits in previous 12 months Vitals Screenings to include cognitive, depression, and falls Referrals and appointments  In addition, I have reviewed and discussed with patient certain preventive protocols, quality metrics, and best practice recommendations. A written personalized care plan for preventive services as well as general preventive health recommendations were provided to patient.     Ashok Pall, LPN   05/26/4096

## 2021-06-22 NOTE — Patient Instructions (Addendum)
?  Joseph Singh , ?Thank you for taking time to come for your Medicare Wellness Visit. I appreciate your ongoing commitment to your health goals. Please review the following plan we discussed and let me know if I can assist you in the future.  ? ?These are the goals we discussed: ? Goals   ? ?  ? Patient Stated  ?   DIET - INCREASE WATER INTAKE (pt-stated)   ?   Less soda. ?  ?   Weight (lb) < 298 lb (135.2 kg) (pt-stated)   ?   I want to lose about 50lb ?Reduce sugar intake ?Monitor diet ?Stay active ?  ? ?  ?  ?This is a list of the screening recommended for you and due dates:  ?Health Maintenance  ?Topic Date Due  ? Hemoglobin A1C  06/30/2021*  ? Colon Cancer Screening  06/30/2021*  ? Zoster (Shingles) Vaccine (1 of 2) 09/22/2021*  ? Flu Shot  09/21/2021  ? Eye exam for diabetics  01/12/2022  ? Complete foot exam   06/22/2022  ? Tetanus Vaccine  02/23/2025  ? Hepatitis C Screening: USPSTF Recommendation to screen - Ages 54-79 yo.  Completed  ? HIV Screening  Completed  ? HPV Vaccine  Aged Out  ? COVID-19 Vaccine  Discontinued  ?*Topic was postponed. The date shown is not the original due date.  ?  ?

## 2021-06-28 ENCOUNTER — Other Ambulatory Visit: Payer: Self-pay | Admitting: Family Medicine

## 2021-06-28 DIAGNOSIS — I10 Essential (primary) hypertension: Secondary | ICD-10-CM

## 2021-06-30 ENCOUNTER — Ambulatory Visit (INDEPENDENT_AMBULATORY_CARE_PROVIDER_SITE_OTHER): Payer: Medicare Other | Admitting: Family Medicine

## 2021-06-30 ENCOUNTER — Encounter: Payer: Self-pay | Admitting: Family Medicine

## 2021-06-30 DIAGNOSIS — I10 Essential (primary) hypertension: Secondary | ICD-10-CM | POA: Diagnosis not present

## 2021-06-30 DIAGNOSIS — E119 Type 2 diabetes mellitus without complications: Secondary | ICD-10-CM

## 2021-06-30 DIAGNOSIS — M5442 Lumbago with sciatica, left side: Secondary | ICD-10-CM

## 2021-06-30 DIAGNOSIS — G8929 Other chronic pain: Secondary | ICD-10-CM | POA: Diagnosis not present

## 2021-06-30 DIAGNOSIS — I739 Peripheral vascular disease, unspecified: Secondary | ICD-10-CM

## 2021-06-30 DIAGNOSIS — Z89432 Acquired absence of left foot: Secondary | ICD-10-CM

## 2021-06-30 DIAGNOSIS — E1142 Type 2 diabetes mellitus with diabetic polyneuropathy: Secondary | ICD-10-CM

## 2021-06-30 DIAGNOSIS — R200 Anesthesia of skin: Secondary | ICD-10-CM | POA: Insufficient documentation

## 2021-06-30 LAB — BASIC METABOLIC PANEL
BUN: 12 mg/dL (ref 6–23)
CO2: 31 mEq/L (ref 19–32)
Calcium: 9.7 mg/dL (ref 8.4–10.5)
Chloride: 98 mEq/L (ref 96–112)
Creatinine, Ser: 0.8 mg/dL (ref 0.40–1.50)
GFR: 94.94 mL/min (ref >60.00)
Glucose, Bld: 193 mg/dL — ABNORMAL HIGH (ref 70–99)
Potassium: 4 mEq/L (ref 3.5–5.1)
Sodium: 137 mEq/L (ref 135–145)

## 2021-06-30 LAB — HEMOGLOBIN A1C: Hgb A1c MFr Bld: 7.3 % — ABNORMAL HIGH (ref 4.6–6.5)

## 2021-06-30 NOTE — Assessment & Plan Note (Signed)
He has been following with several podiatrist though he is overdue for follow-up with vascular surgery.  He will continue aspirin and Plavix.  He will contact vascular surgery to schedule follow-up. ?

## 2021-06-30 NOTE — Patient Instructions (Signed)
Nice to see you. ?We will get lab work today. ?Please contact Dr. Driscilla Grammes office to schedule follow-up as it appears you are overdue for this. ?Please try the cubital tunnel braces for your elbows.  You can wear these at night and see if they provide any benefit for the issues in your hands.  If you have not noticed any benefit in the next 2 weeks please let me know so we can evaluate further.  If you develop any new symptoms you need to let us know immediately. ?

## 2021-06-30 NOTE — Assessment & Plan Note (Signed)
Chronic issue.  Stable.  He can continue tramadol 50 mg every 8 hours as needed. ?

## 2021-06-30 NOTE — Assessment & Plan Note (Signed)
Check A1c.  He will continue glipizide XR 10 mg daily, Jardiance 25 mg daily, metformin 1000 mg twice daily, and Ozempic 1 mg weekly.  Diabetic foot exam was completed today.  Paperwork for his custom orthotics signed off on. ?

## 2021-06-30 NOTE — Assessment & Plan Note (Addendum)
Previously well-controlled.  Elevated today.  He will continue metoprolol succinate 50 mg once daily, lisinopril-HCTZ 1 tablet daily, and amlodipine 10 mg daily.  Check BMP.  He will return in 2 weeks for nurse blood pressure check. ?

## 2021-06-30 NOTE — Assessment & Plan Note (Signed)
Patient with bilateral ulnar aspect finger numbness and tingling.  This certainly could be related to neuropathy though the distribution would make it seem more likely related to a nerve impingement.  Discussed this could be cubital tunnel syndrome though could also represent an impingement issue in his neck.  The patient wanted to see how he would do with cubital tunnel braces for a week or 2.  If this is not improving with use of those braces he will let us know and we can pursue further evaluation.  He was advised to let us know immediately if anything worsened or he developed any new neurological symptoms. ?

## 2021-06-30 NOTE — Assessment & Plan Note (Signed)
Foot exam completed.  He will continue to see his specialist. ?

## 2021-06-30 NOTE — Progress Notes (Signed)
?[name] Rumps, MD ?Phone: [phone] ? ?Joseph Singh is a 61 y.o. male who presents today for f/u. ? ?HYPERTENSION ?Disease Monitoring ?Home BP Monitoring 120/70 Chest pain- no    Dyspnea- no ?Medications ?Compliance-  taking amlodipine, lisinopril, HCTZ, metoprolol.   Edema- no ?BMET ?   ?Component Value Date/Time  ? NA 134 (L) 11/19/2020 0809  ? K 4.2 11/19/2020 0809  ? CL 93 (L) 11/19/2020 0809  ? CO2 31 11/19/2020 0809  ? GLUCOSE 184 (H) 11/19/2020 0809  ? BUN 13 11/19/2020 0809  ? CREATININE 0.80 11/19/2020 0809  ? CREATININE 1.15 09/30/2016 1526  ? CALCIUM 9.5 11/19/2020 0809  ? GFRNONAA >60 02/01/2019 1559  ? GFRAA >60 02/01/2019 1559  ? ?DIABETES ?Disease Monitoring: ?Blood Sugar ranges-unsure of numbers Polyuria/phagia/dipsia- no      Optho- UTD ?Medications: ?Compliance- taking glipizide, jardiance, metformin, ozempic Hypoglycemic symptoms- no ? ?Chronic back pain and neuropathy: Patient notes these things are generally stable.  The tramadol does a good job controlling his symptoms.  He reports some numbness and tingling in his fourth and fifth fingers bilaterally.  This has been an ongoing issue for a while now.  Notes it seems to get worse at night when he sleeps.  He sleeps with his elbows flexed.  He notes no neck pain or history of neck surgery. ? ?Patient also presents today for a foot exam for his custom orthotic/prosthetic. ? ? ?Social History  ? ?Tobacco Use  ?Smoking Status Former  ? Packs/day: 0.50  ? Years: 35.00  ? Pack years: 17.50  ? Types: Cigarettes  ? Quit date: 08/09/2018  ? Years since quitting: 2.8  ?Smokeless Tobacco Never  ? ? ?Current Outpatient Medications on File Prior to Visit  ?Medication Sig Dispense Refill  ? amLODipine (NORVASC) 10 MG tablet TAKE 1 TABLET BY MOUTH DAILY 90 tablet 3  ? aspirin EC 81 MG tablet Take 1 tablet (81 mg total) by mouth daily. 150 tablet 2  ? atorvastatin (LIPITOR) 80 MG tablet TAKE ONE TABLET BY MOUTH EVERY DAY 90 tablet 1  ? blood glucose  meter kit and supplies KIT E11.51, check 2x daily, dispense based on patient and insurance preference 1 each 0  ? clopidogrel (PLAVIX) 75 MG tablet TAKE ONE TABLET BY MOUTH EVERY DAY 90 tablet 3  ? gabapentin (NEURONTIN) 300 MG capsule TAKE 1 CAPSULE BY MOUTH FOUR TIMES DAILY 360 capsule 2  ? glipiZIDE (GLUCOTROL XL) 10 MG 24 hr tablet TAKE ONE TABLET BY MOUTH EACH DAY WITH BREAKFAST 90 tablet 3  ? glucose blood (ONE TOUCH ULTRA TEST) test strip Check blood sugar twice daily Dx code: E11.9 200 each 0  ? JARDIANCE 25 MG TABS tablet TAKE 1 TABLET BY MOUTH ONCE DAILY 90 tablet 3  ? lisinopril-hydrochlorothiazide (ZESTORETIC) 10-12.5 MG tablet TAKE 1 TABLET BY MOUTH DAILY 90 tablet 3  ? metFORMIN (GLUCOPHAGE) 1000 MG tablet Take 1 tablet (1,000 mg total) by mouth 2 (two) times daily with a meal. 90 tablet 1  ? metoprolol succinate (TOPROL-XL) 50 MG 24 hr tablet TAKE ONE TABLET BY MOUTH ONCE DAILY WITHOR IMMEDIATELY FOLLOWING A MEAL 90 tablet 1  ? Semaglutide, 1 MG/DOSE, (OZEMPIC, 1 MG/DOSE,) 4 MG/3ML SOPN INJECT $RemoveBef'1MG'BFpGhlyFVv$  (0.75ML) SUBCUTANEOUSLY ONCE PER WEEK 9 mL 3  ? traMADol (ULTRAM) 50 MG tablet TAKE 1 TABLET BY MOUTH EVERY 8 HOURS AS NEEDED FOR MODERATE PAIN 90 tablet 0  ? ?No current facility-administered medications on file prior to visit.  ? ? ? ?ROS  see history of present illness ? ?Objective ? ?Physical Exam ?Vitals:  ? 06/30/21 0855  ?BP: (!) 150/80  ?Pulse: 72  ?Temp: 98.7 ?F (37.1 ?C)  ?SpO2: 97%  ? ? ?BP Readings from Last 3 Encounters:  ?06/30/21 (!) 150/80  ?10/02/20 126/72  ?07/01/20 115/60  ? ?Wt Readings from Last 3 Encounters:  ?06/30/21 (!) 307 lb (139.3 kg)  ?06/22/21 298 lb (135.2 kg)  ?01/21/21 298 lb (135.2 kg)  ? ? ?Physical Exam ?Constitutional:   ?   General: He is not in acute distress. ?   Appearance: He is not diaphoretic.  ?Cardiovascular:  ?   Rate and Rhythm: Normal rate and regular rhythm.  ?   Heart sounds: Normal heart sounds.  ?Pulmonary:  ?   Effort: Pulmonary effort is normal.  ?    Breath sounds: Normal breath sounds.  ?Musculoskeletal:  ?   Comments: Slight tenderness over the ulnar aspect of his bilateral elbows, negative Tinel's at the cubital tunnel bilaterally  ?Skin: ?   General: Skin is warm and dry.  ?Neurological:  ?   Mental Status: He is alert.  ?   Comments: 5/5 strength bilateral biceps, triceps, handgrip, slight sensation to light touch decreased in the fourth and fifth fingers bilaterally, otherwise sensation to light touch intact bilateral upper extremities  ? ?Diabetic Foot Exam - Simple   ?Simple Foot Form ?Diabetic Foot exam was performed with the following findings: Yes 06/30/2021  9:16 AM  ?Visual Inspection ?See comments: Yes ?Sensation Testing ?See comments: Yes ?Pulse Check ?See comments: Yes ?Comments ?Patient has postsurgical findings in his left foot with the first 3 toes present, the fourth and fifth toes are absent and he appears to have had a portion of his metatarsal area removed as well, there are no signs of infection, there is joint enlargement at the left ankle, there is no apparent skin breakdown, right foot with onychomycosis on all toenails, some flaking of skin on the right foot, he lacks light touch and monofilament sensation bilaterally, unable to palpate PT and DP pulses bilaterally ?  ? ? ? ?Assessment/Plan: Please see individual problem list. ? ?Problem List Items Addressed This Visit   ? ? Back pain (Chronic)  ?  Chronic issue.  Stable.  He can continue tramadol 50 mg every 8 hours as needed. ? ?  ?  ? Bilateral finger numbness (Chronic)  ?  Patient with bilateral ulnar aspect finger numbness and tingling.  This certainly could be related to neuropathy though the distribution would make it seem more likely related to a nerve impingement.  Discussed this could be cubital tunnel syndrome though could also represent an impingement issue in his neck.  The patient wanted to see how he would do with cubital tunnel braces for a week or 2.  If this is not  improving with use of those braces he will let us know and we can pursue further evaluation.  He was advised to let us know immediately if anything worsened or he developed any new neurological symptoms. ? ?  ?  ? Diabetic neuropathy [name] (Chronic)  ?  Chronic issue.  Adequately controlled.  He will continue gabapentin 300 mg 4 times daily and tramadol 50 mg every 8 hours as needed. ? ?  ?  ? Essential hypertension (Chronic)  ?  Previously well-controlled.  Elevated today.  He will continue metoprolol succinate 50 mg once daily, lisinopril-HCTZ 1 tablet daily, and amlodipine 10 mg daily.  Check BMP.  He will return in 2 weeks for nurse blood pressure check. ? ?  ?  ? Relevant Orders  ? Basic Metabolic Panel (BMET)  ? Partial nontraumatic amputation of foot, left [name] (Chronic)  ?  Foot exam completed.  He will continue to see his specialist. ? ?  ?  ? PVD (peripheral vascular disease) ([place]) (Chronic)  ?  He has been following with several podiatrist though he is overdue for follow-up with vascular surgery.  He will continue aspirin and Plavix.  He will contact vascular surgery to schedule follow-up. ? ?  ?  ? Type 2 diabetes mellitus without complication, without long-term current use of insulin [name] (Chronic)  ?  Check A1c.  He will continue glipizide XR 10 mg daily, Jardiance 25 mg daily, metformin 1000 mg twice daily, and Ozempic 1 mg weekly.  Diabetic foot exam was completed today.  Paperwork for his custom orthotics signed off on. ? ?  ?  ? Relevant Orders  ? HgB A1c  ? ? ?Return in about 2 weeks (around 07/14/2021) for Nurse blood pressure check, 3 months PCP. ? ? ?[name] Rumps, MD ?Industry ? ?

## 2021-06-30 NOTE — Assessment & Plan Note (Signed)
Chronic issue.  Adequately controlled.  He will continue gabapentin 300 mg 4 times daily and tramadol 50 mg every 8 hours as needed. ?

## 2021-07-06 ENCOUNTER — Other Ambulatory Visit: Payer: Self-pay | Admitting: Family Medicine

## 2021-07-06 MED ORDER — SEMAGLUTIDE (2 MG/DOSE) 8 MG/3ML ~~LOC~~ SOPN: 2.0000 mg | PEN_INJECTOR | SUBCUTANEOUS | 1 refills | Status: DC

## 2021-07-13 ENCOUNTER — Other Ambulatory Visit: Payer: Self-pay | Admitting: Family Medicine

## 2021-07-13 DIAGNOSIS — Z89422 Acquired absence of other left toe(s): Secondary | ICD-10-CM | POA: Diagnosis not present

## 2021-07-13 DIAGNOSIS — E1142 Type 2 diabetes mellitus with diabetic polyneuropathy: Secondary | ICD-10-CM

## 2021-07-13 DIAGNOSIS — E1140 Type 2 diabetes mellitus with diabetic neuropathy, unspecified: Secondary | ICD-10-CM | POA: Diagnosis not present

## 2021-07-13 DIAGNOSIS — B351 Tinea unguium: Secondary | ICD-10-CM | POA: Diagnosis not present

## 2021-07-13 DIAGNOSIS — M14672 Charcot's joint, left ankle and foot: Secondary | ICD-10-CM | POA: Diagnosis not present

## 2021-07-13 DIAGNOSIS — E785 Hyperlipidemia, unspecified: Secondary | ICD-10-CM

## 2021-07-14 ENCOUNTER — Ambulatory Visit: Payer: Medicare Other

## 2021-07-15 ENCOUNTER — Telehealth: Payer: Self-pay | Admitting: Family Medicine

## 2021-07-15 NOTE — Telephone Encounter (Signed)
Already refilled

## 2021-07-15 NOTE — Telephone Encounter (Signed)
Refilled: 06/10/2021 Last OV: 06/30/2021 Next OV: 10/06/2021

## 2021-07-15 NOTE — Telephone Encounter (Signed)
Patient called and is requesting a refill on his raMADol (ULTRAM) 50 MG tablet

## 2021-07-27 ENCOUNTER — Telehealth: Payer: Self-pay

## 2021-07-27 NOTE — Telephone Encounter (Signed)
Leigh called from [employer] [facility] [employer] regarding patient.  Marliss Czar states she faxed [place] a request, most recently on 07/23/2021, for co-signature on foot notes for patient.  Marliss Czar states the form for signature is dated 06/30/2021 to match the F1 form.  Leigh asked that we please let her know if that is a problem.  Also, Leigh requested a copy of the patient's office note from 06/30/2021.  Marliss Czar states her fax number is [phone].

## 2021-07-28 NOTE — Telephone Encounter (Signed)
This form was faxed to them on yesterday. Confirmation given.  Avrielle Fry,cma

## 2021-08-03 ENCOUNTER — Encounter: Payer: Self-pay | Admitting: Family Medicine

## 2021-08-03 DIAGNOSIS — M14672 Charcot's joint, left ankle and foot: Secondary | ICD-10-CM | POA: Diagnosis not present

## 2021-08-03 NOTE — Telephone Encounter (Signed)
I called and spoke with Leigh and informed her that the faxes are not going through she gave me her email [email or web address] and I emailed the information to her and I received a confirmation.  Casidee Jann,cma

## 2021-08-03 NOTE — Telephone Encounter (Signed)
Pt called about mychart message regarding shoes

## 2021-08-04 ENCOUNTER — Telehealth: Payer: Self-pay

## 2021-08-04 NOTE — Telephone Encounter (Signed)
I called to [employer] [facility] and spoke with leigh simmons and informed her that I had made several attempts to fax the form needed for the patient and the fax failed each time, she asked me to put it in the mail and it was mailed today.  Jobie Popp,cma

## 2021-08-05 ENCOUNTER — Other Ambulatory Visit: Payer: Self-pay | Admitting: Family Medicine

## 2021-08-05 DIAGNOSIS — E119 Type 2 diabetes mellitus without complications: Secondary | ICD-10-CM

## 2021-08-09 ENCOUNTER — Other Ambulatory Visit: Payer: Self-pay | Admitting: Family Medicine

## 2021-08-09 DIAGNOSIS — E1142 Type 2 diabetes mellitus with diabetic polyneuropathy: Secondary | ICD-10-CM

## 2021-08-13 ENCOUNTER — Other Ambulatory Visit: Payer: Self-pay | Admitting: Family Medicine

## 2021-08-13 DIAGNOSIS — G629 Polyneuropathy, unspecified: Secondary | ICD-10-CM

## 2021-08-30 ENCOUNTER — Other Ambulatory Visit: Payer: Self-pay | Admitting: Family Medicine

## 2021-08-30 DIAGNOSIS — I10 Essential (primary) hypertension: Secondary | ICD-10-CM

## 2021-09-02 ENCOUNTER — Other Ambulatory Visit: Payer: Self-pay | Admitting: Family Medicine

## 2021-09-08 ENCOUNTER — Other Ambulatory Visit: Payer: Self-pay | Admitting: Family Medicine

## 2021-09-08 DIAGNOSIS — E1142 Type 2 diabetes mellitus with diabetic polyneuropathy: Secondary | ICD-10-CM

## 2021-09-14 DIAGNOSIS — E1143 Type 2 diabetes mellitus with diabetic autonomic (poly)neuropathy: Secondary | ICD-10-CM | POA: Diagnosis not present

## 2021-09-14 DIAGNOSIS — M14672 Charcot's joint, left ankle and foot: Secondary | ICD-10-CM | POA: Diagnosis not present

## 2021-09-24 ENCOUNTER — Other Ambulatory Visit: Payer: Self-pay | Admitting: Family Medicine

## 2021-09-24 DIAGNOSIS — E119 Type 2 diabetes mellitus without complications: Secondary | ICD-10-CM

## 2021-09-29 DIAGNOSIS — E1142 Type 2 diabetes mellitus with diabetic polyneuropathy: Secondary | ICD-10-CM | POA: Diagnosis not present

## 2021-09-29 DIAGNOSIS — E11622 Type 2 diabetes mellitus with other skin ulcer: Secondary | ICD-10-CM | POA: Diagnosis not present

## 2021-09-29 DIAGNOSIS — E1143 Type 2 diabetes mellitus with diabetic autonomic (poly)neuropathy: Secondary | ICD-10-CM | POA: Diagnosis not present

## 2021-09-29 DIAGNOSIS — M14672 Charcot's joint, left ankle and foot: Secondary | ICD-10-CM | POA: Diagnosis not present

## 2021-09-29 DIAGNOSIS — L97309 Non-pressure chronic ulcer of unspecified ankle with unspecified severity: Secondary | ICD-10-CM | POA: Diagnosis not present

## 2021-09-29 DIAGNOSIS — G629 Polyneuropathy, unspecified: Secondary | ICD-10-CM | POA: Diagnosis not present

## 2021-10-06 ENCOUNTER — Ambulatory Visit (INDEPENDENT_AMBULATORY_CARE_PROVIDER_SITE_OTHER): Payer: Medicare Other | Admitting: Family Medicine

## 2021-10-06 ENCOUNTER — Encounter: Payer: Self-pay | Admitting: Family Medicine

## 2021-10-06 DIAGNOSIS — E119 Type 2 diabetes mellitus without complications: Secondary | ICD-10-CM | POA: Diagnosis not present

## 2021-10-06 DIAGNOSIS — M5441 Lumbago with sciatica, right side: Secondary | ICD-10-CM

## 2021-10-06 DIAGNOSIS — E785 Hyperlipidemia, unspecified: Secondary | ICD-10-CM

## 2021-10-06 DIAGNOSIS — E1142 Type 2 diabetes mellitus with diabetic polyneuropathy: Secondary | ICD-10-CM | POA: Diagnosis not present

## 2021-10-06 DIAGNOSIS — I10 Essential (primary) hypertension: Secondary | ICD-10-CM | POA: Diagnosis not present

## 2021-10-06 DIAGNOSIS — G8929 Other chronic pain: Secondary | ICD-10-CM | POA: Diagnosis not present

## 2021-10-06 DIAGNOSIS — Z89432 Acquired absence of left foot: Secondary | ICD-10-CM

## 2021-10-06 LAB — COMPREHENSIVE METABOLIC PANEL
ALT: 48 U/L (ref 0–53)
AST: 28 U/L (ref 0–37)
Albumin: 4.1 g/dL (ref 3.5–5.2)
Alkaline Phosphatase: 94 U/L (ref 39–117)
BUN: 10 mg/dL (ref 6–23)
CO2: 30 mEq/L (ref 19–32)
Calcium: 9.6 mg/dL (ref 8.4–10.5)
Chloride: 97 mEq/L (ref 96–112)
Creatinine, Ser: 0.79 mg/dL (ref 0.40–1.50)
GFR: 95.12 mL/min (ref >60.00)
Glucose, Bld: 167 mg/dL — ABNORMAL HIGH (ref 70–99)
Potassium: 4.3 mEq/L (ref 3.5–5.1)
Sodium: 137 mEq/L (ref 135–145)
Total Bilirubin: 0.8 mg/dL (ref 0.2–1.2)
Total Protein: 6.7 g/dL (ref 6.0–8.3)

## 2021-10-06 LAB — LIPID PANEL
Cholesterol: 121 mg/dL (ref 0–200)
HDL: 33.9 mg/dL — ABNORMAL LOW (ref >39.00)
NonHDL: 86.68
Total CHOL/HDL Ratio: 4
Triglycerides: 278 mg/dL — ABNORMAL HIGH (ref 0.0–149.0)
VLDL: 55.6 mg/dL — ABNORMAL HIGH (ref 0.0–40.0)

## 2021-10-06 LAB — LDL CHOLESTEROL, DIRECT: Direct LDL: 58 mg/dL

## 2021-10-06 LAB — HEMOGLOBIN A1C: Hgb A1c MFr Bld: 7.3 % — ABNORMAL HIGH (ref 4.6–6.5)

## 2021-10-06 MED ORDER — PREDNISONE 20 MG PO TABS: 40.0000 mg | ORAL_TABLET | Freq: Every day | ORAL | 0 refills | Status: DC

## 2021-10-06 MED ORDER — TRAMADOL HCL 50 MG PO TABS: ORAL_TABLET | ORAL | 0 refills | Status: DC

## 2021-10-06 NOTE — Assessment & Plan Note (Signed)
Check A1c.  He will continue glipizide 10 mg daily, Jardiance 25 mg daily, metformin 1000 mg twice daily, and Ozempic 2 mg daily.

## 2021-10-06 NOTE — Patient Instructions (Signed)
Nice to see you. We will get lab work today. Please try the prednisone to see if that helps with your back.  If your sugar goes up above 300 while on the prednisone please let us know.

## 2021-10-06 NOTE — Assessment & Plan Note (Signed)
Continue Lipitor 80 mg daily.  Check labs. 

## 2021-10-06 NOTE — Assessment & Plan Note (Signed)
Chronic issue with recent exacerbation.  He can continue tramadol 50 mg every 8 hours as needed.  Discussed a prednisone burst 40 mg daily for 5 days to help reduce any inflammation surrounding the nerve root that could be contributing to his radicular symptoms.  Advised to monitor his sugars and if they get into the 300s on this he needs to let us know.  Discussed risk of agitation, sleeping issues, and increased appetite with the prednisone.

## 2021-10-06 NOTE — Assessment & Plan Note (Signed)
Adequately controlled.  He will continue metoprolol 50 mg daily, lisinopril-HCTZ 10-12.5 mg daily, and amlodipine 10 mg daily.

## 2021-10-06 NOTE — Assessment & Plan Note (Signed)
He will continue to follow with his specialist. 

## 2021-10-06 NOTE — Addendum Note (Signed)
Addended by: Dennie Bible on: 10/06/2021 04:46 PM   Modules accepted: Orders

## 2021-10-06 NOTE — Progress Notes (Signed)
I have completed the mobility referral , printed DME given to cma for provider signature and to fax.

## 2021-10-06 NOTE — Progress Notes (Signed)
Joseph Rumps, MD Phone: [phone]  Joseph Singh is a 61 y.o. male who presents today for f/u.  DIABETES Disease Monitoring: Blood Sugar ranges-"ok"  Polyuria/phagia/dipsia- no      Optho- UTD Medications: Compliance- taking glipizide, metformin, ozempic Hypoglycemic symptoms- no  HYPERTENSION Disease Monitoring Home BP Monitoring similar  Chest pain- no    Dyspnea- no Medications Compliance-  taking amlodipine, metoprolol, lisinopril, HCTZ.   Edema- no BMET    Component Value Date/Time   NA 137 06/30/2021 0926   K 4.0 06/30/2021 0926   CL 98 06/30/2021 0926   CO2 31 06/30/2021 0926   GLUCOSE 193 (H) 06/30/2021 0926   BUN 12 06/30/2021 0926   CREATININE 0.80 06/30/2021 0926   CREATININE 1.15 09/30/2016 1526   CALCIUM 9.7 06/30/2021 0926   GFRNONAA >60 02/01/2019 1559   GFRAA >60 02/01/2019 1559   Chronic back pain: Patient notes he has had some exacerbation of his back pain recently as he has a new insert in his diabetic shoes.  He notes some radiation down the lateral aspect of his right leg with some numbness down that same aspect.  He notes those things typically improve as he moves around throughout the day.  He has the right low back pain all the time.  No weakness.  In the past this was well managed by a chiropractor.  Tramadol is helpful for the pain.  History of partial foot amputation: Patient remains in a boot.  He had a small ulcer on the inner portion of his left ankle that has been healing.  He continues to follow with podiatry.   Social History   Tobacco Use  Smoking Status Former   Packs/day: 0.50   Years: 35.00   Total pack years: 17.50   Types: Cigarettes   Quit date: 08/09/2018   Years since quitting: 3.1  Smokeless Tobacco Never    Current Outpatient Medications on File Prior to Visit  Medication Sig Dispense Refill   amLODipine (NORVASC) 10 MG tablet TAKE 1 TABLET BY MOUTH DAILY 90 tablet 3   aspirin EC 81 MG tablet Take 1 tablet (81 mg  total) by mouth daily. 150 tablet 2   atorvastatin (LIPITOR) 80 MG tablet TAKE ONE TABLET BY MOUTH EVERY DAY 90 tablet 3   blood glucose meter kit and supplies KIT E11.51, check 2x daily, dispense based on patient and insurance preference 1 each 0   clopidogrel (PLAVIX) 75 MG tablet TAKE ONE TABLET BY MOUTH EVERY DAY 90 tablet 3   gabapentin (NEURONTIN) 300 MG capsule TAKE 1 CAPSULE BY MOUTH FOUR TIMES DAILY 360 capsule 2   glipiZIDE (GLUCOTROL XL) 10 MG 24 hr tablet TAKE ONE TABLET BY MOUTH EACH DAY WITH BREAKFAST 90 tablet 3   glucose blood (ONE TOUCH ULTRA TEST) test strip Check blood sugar twice daily Dx code: E11.9 200 each 0   JARDIANCE 25 MG TABS tablet TAKE 1 TABLET BY MOUTH ONCE DAILY 90 tablet 3   lisinopril-hydrochlorothiazide (ZESTORETIC) 10-12.5 MG tablet TAKE 1 TABLET BY MOUTH DAILY 90 tablet 3   metFORMIN (GLUCOPHAGE) 1000 MG tablet TAKE ONE TABLET BY MOUTH TWICE DAILY WITH A MEAL 90 tablet 1   metoprolol succinate (TOPROL-XL) 50 MG 24 hr tablet TAKE ONE TABLET BY MOUTH ONCE DAILY WITHOR IMMEDIATELY FOLLOWING A MEAL 90 tablet 1   Semaglutide, 2 MG/DOSE, 8 MG/3ML SOPN Inject 2 mg as directed once a week. 9 mL 1   No current facility-administered medications on file prior to visit.  ROS see history of present illness  Objective  Physical Exam Vitals:   10/06/21 0858  BP: 110/80  Pulse: 75  Temp: 98 F (36.7 C)  SpO2: 97%    BP Readings from Last 3 Encounters:  10/06/21 110/80  06/30/21 (!) 150/80  10/02/20 126/72   Wt Readings from Last 3 Encounters:  10/06/21 (!) 303 lb 9.6 oz (137.7 kg)  06/30/21 (!) 307 lb (139.3 kg)  06/22/21 298 lb (135.2 kg)    Physical Exam Constitutional:      General: He is not in acute distress.    Appearance: He is not diaphoretic.  Cardiovascular:     Rate and Rhythm: Normal rate and regular rhythm.     Heart sounds: Normal heart sounds.  Pulmonary:     Effort: Pulmonary effort is normal.     Breath sounds: Normal breath  sounds.  Musculoskeletal:     Comments: No midline spine tenderness, no midline spine step-off, there is muscular lumbar tenderness  Skin:    General: Skin is warm and dry.  Neurological:     Mental Status: He is alert.     Comments: 5/5 strength bilateral quads and hamstrings, 5/5 right dorsiflexion and plantarflexion, unable to evaluate left dorsiflexion and plantarflexion given that he is in a boot      Assessment/Plan: Please see individual problem list.  Problem List Items Addressed This Visit     Back pain (Chronic)    Chronic issue with recent exacerbation.  He can continue tramadol 50 mg every 8 hours as needed.  Discussed a prednisone burst 40 mg daily for 5 days to help reduce any inflammation surrounding the nerve root that could be contributing to his radicular symptoms.  Advised to monitor his sugars and if they get into the 300s on this he needs to let us know.  Discussed risk of agitation, sleeping issues, and increased appetite with the prednisone.      Relevant Medications   predniSONE (DELTASONE) 20 MG tablet   traMADol (ULTRAM) 50 MG tablet (Start on 10/10/2021)   Diabetic neuropathy [name] (Chronic)   Relevant Medications   traMADol (ULTRAM) 50 MG tablet (Start on 10/10/2021)   Essential hypertension (Chronic)    Adequately controlled.  He will continue metoprolol 50 mg daily, lisinopril-HCTZ 10-12.5 mg daily, and amlodipine 10 mg daily.      Relevant Orders   Comp Met (CMET)   Hyperlipidemia (Chronic)    Continue Lipitor 80 mg daily.  Check labs.      Relevant Orders   Comp Met (CMET)   Lipid panel   Partial nontraumatic amputation of foot, left [name] (Chronic)    He will continue to follow with his specialist.      Type 2 diabetes mellitus without complication, without long-term current use of insulin [name] (Chronic)    Check A1c.  He will continue glipizide 10 mg daily, Jardiance 25 mg daily, metformin 1000 mg twice daily, and Ozempic 2 mg daily.       Relevant Orders   Comp Met (CMET)   Lipid panel   HgB A1c   Patient asked about getting a power scooter.  Discussed was not quite sure about the process of getting that done though I would ask.  He notes a hard time walking around given his left foot partial amputation and boot that he has to wear.  Health Maintenance: I encouraged the patient to have a colonoscopy completed.  He notes he is continuing to think about having  this done.  He understands the risk of missing a cancer or a polyp that could turn into cancer.  Return in about 3 months (around 01/06/2022) for Diabetes/pain follow-up.   Joseph Rumps, MD [place]

## 2021-10-12 ENCOUNTER — Telehealth: Payer: Self-pay

## 2021-10-12 NOTE — Telephone Encounter (Signed)
Theodoro Grist, from [facility] [employer], states he would like to speak with Tori Milks regarding a referral they received today for patient.

## 2021-10-20 DIAGNOSIS — M14672 Charcot's joint, left ankle and foot: Secondary | ICD-10-CM | POA: Diagnosis not present

## 2021-10-20 DIAGNOSIS — E11622 Type 2 diabetes mellitus with other skin ulcer: Secondary | ICD-10-CM | POA: Diagnosis not present

## 2021-10-20 DIAGNOSIS — E1143 Type 2 diabetes mellitus with diabetic autonomic (poly)neuropathy: Secondary | ICD-10-CM | POA: Diagnosis not present

## 2021-10-20 DIAGNOSIS — L97309 Non-pressure chronic ulcer of unspecified ankle with unspecified severity: Secondary | ICD-10-CM | POA: Diagnosis not present

## 2021-11-10 ENCOUNTER — Other Ambulatory Visit: Payer: Self-pay | Admitting: Family Medicine

## 2021-11-10 DIAGNOSIS — E1142 Type 2 diabetes mellitus with diabetic polyneuropathy: Secondary | ICD-10-CM

## 2021-11-17 DIAGNOSIS — M14672 Charcot's joint, left ankle and foot: Secondary | ICD-10-CM | POA: Diagnosis not present

## 2021-11-17 DIAGNOSIS — E11622 Type 2 diabetes mellitus with other skin ulcer: Secondary | ICD-10-CM | POA: Diagnosis not present

## 2021-11-17 DIAGNOSIS — L97309 Non-pressure chronic ulcer of unspecified ankle with unspecified severity: Secondary | ICD-10-CM | POA: Diagnosis not present

## 2021-11-17 DIAGNOSIS — E1143 Type 2 diabetes mellitus with diabetic autonomic (poly)neuropathy: Secondary | ICD-10-CM | POA: Diagnosis not present

## 2021-11-25 ENCOUNTER — Other Ambulatory Visit: Payer: Self-pay | Admitting: Family Medicine

## 2021-11-25 DIAGNOSIS — I10 Essential (primary) hypertension: Secondary | ICD-10-CM

## 2021-11-26 ENCOUNTER — Other Ambulatory Visit: Payer: Self-pay | Admitting: Family Medicine

## 2021-11-26 DIAGNOSIS — E119 Type 2 diabetes mellitus without complications: Secondary | ICD-10-CM

## 2021-12-09 ENCOUNTER — Other Ambulatory Visit: Payer: Self-pay | Admitting: Family Medicine

## 2021-12-09 DIAGNOSIS — E1142 Type 2 diabetes mellitus with diabetic polyneuropathy: Secondary | ICD-10-CM

## 2021-12-20 ENCOUNTER — Encounter (INDEPENDENT_AMBULATORY_CARE_PROVIDER_SITE_OTHER): Payer: Self-pay

## 2021-12-28 ENCOUNTER — Other Ambulatory Visit: Payer: Self-pay | Admitting: Family

## 2021-12-28 DIAGNOSIS — E119 Type 2 diabetes mellitus without complications: Secondary | ICD-10-CM

## 2022-01-10 ENCOUNTER — Other Ambulatory Visit: Payer: Self-pay | Admitting: Family Medicine

## 2022-01-10 DIAGNOSIS — E1142 Type 2 diabetes mellitus with diabetic polyneuropathy: Secondary | ICD-10-CM

## 2022-01-12 ENCOUNTER — Ambulatory Visit: Payer: Medicare Other | Admitting: Family Medicine

## 2022-02-07 ENCOUNTER — Other Ambulatory Visit: Payer: Self-pay | Admitting: Family Medicine

## 2022-02-07 DIAGNOSIS — E1142 Type 2 diabetes mellitus with diabetic polyneuropathy: Secondary | ICD-10-CM

## 2022-02-08 NOTE — Telephone Encounter (Signed)
Okay to refill?  LOV: 10/06/21 NOV: 02/16/22

## 2022-02-09 ENCOUNTER — Other Ambulatory Visit: Payer: Self-pay | Admitting: Family Medicine

## 2022-02-09 ENCOUNTER — Encounter: Payer: Self-pay | Admitting: Family Medicine

## 2022-02-09 DIAGNOSIS — E1142 Type 2 diabetes mellitus with diabetic polyneuropathy: Secondary | ICD-10-CM

## 2022-02-09 DIAGNOSIS — R809 Proteinuria, unspecified: Secondary | ICD-10-CM

## 2022-02-09 NOTE — Progress Notes (Signed)
[facility] [employer] Other; (KED MEASURE CHART REVIEWED FOR KED (KIDNEY HEALTH) GAP- URINE MICROALBUMIN CREATININE RATIO NEEDED TO CLOSE GAP. PLEASE ADDRESS BEFORE 02/20/2022 IF POSSIBLE. THANKS!) 

## 2022-02-16 ENCOUNTER — Telehealth: Payer: Medicare Other | Admitting: Family Medicine

## 2022-02-16 NOTE — Progress Notes (Signed)
I called the patient and informed him that the provider needed to close a gap and we need a microalbumin and creatin and he understood and is scheduled for labs this week.  Nyal Schachter,cma

## 2022-02-16 NOTE — Addendum Note (Signed)
Addended by: Charlyne Mom D on: 02/16/2022 12:21 PM   Modules accepted: Orders

## 2022-02-17 ENCOUNTER — Other Ambulatory Visit: Payer: Medicare Other

## 2022-02-17 DIAGNOSIS — R809 Proteinuria, unspecified: Secondary | ICD-10-CM

## 2022-02-18 LAB — MICROALBUMIN / CREATININE URINE RATIO
Creatinine, Urine: 51.6 mg/dL
Microalb/Creat Ratio: 23 mg/g creat (ref 0–29)
Microalbumin, Urine: 11.7 ug/mL

## 2022-03-09 ENCOUNTER — Ambulatory Visit (INDEPENDENT_AMBULATORY_CARE_PROVIDER_SITE_OTHER): Payer: 59 | Admitting: Family Medicine

## 2022-03-09 ENCOUNTER — Encounter: Payer: Self-pay | Admitting: Family Medicine

## 2022-03-09 VITALS — BP 118/70 | HR 78 | Temp 98.1°F | Ht 76.0 in | Wt 295.2 lb

## 2022-03-09 DIAGNOSIS — E119 Type 2 diabetes mellitus without complications: Secondary | ICD-10-CM

## 2022-03-09 DIAGNOSIS — I10 Essential (primary) hypertension: Secondary | ICD-10-CM

## 2022-03-09 DIAGNOSIS — N200 Calculus of kidney: Secondary | ICD-10-CM | POA: Diagnosis not present

## 2022-03-09 DIAGNOSIS — M5441 Lumbago with sciatica, right side: Secondary | ICD-10-CM | POA: Diagnosis not present

## 2022-03-09 DIAGNOSIS — G8929 Other chronic pain: Secondary | ICD-10-CM

## 2022-03-09 DIAGNOSIS — E1142 Type 2 diabetes mellitus with diabetic polyneuropathy: Secondary | ICD-10-CM | POA: Diagnosis not present

## 2022-03-09 LAB — POCT GLYCOSYLATED HEMOGLOBIN (HGB A1C): Hemoglobin A1C: 8.3 % — AB (ref 4.0–5.6)

## 2022-03-09 MED ORDER — TIRZEPATIDE 2.5 MG/0.5ML ~~LOC~~ SOAJ: 2.5000 mg | SUBCUTANEOUS | 0 refills | Status: AC

## 2022-03-09 MED ORDER — TRAMADOL HCL 50 MG PO TABS: ORAL_TABLET | ORAL | 0 refills | Status: DC

## 2022-03-09 MED ORDER — TIRZEPATIDE 5 MG/0.5ML ~~LOC~~ SOAJ: 5.0000 mg | SUBCUTANEOUS | 2 refills | Status: DC

## 2022-03-09 NOTE — Assessment & Plan Note (Addendum)
Checked A1c. It has gone up to 8.3 from 7.3 six months ago. We will try [facility] 2.5mg  weekly for four weeks and then move up to 5.0mg  weekly and schedule patient for a follow-up. Advised patient that we have to start back on the lowest dose as he is starting a new medication. Precautions to stop medication and possible side effects discussed with patient such as nausea and abdominal pain. In the meantime he will discontinue Ozempic. He will let us know if Darcel Bayley is too expensive.

## 2022-03-09 NOTE — Assessment & Plan Note (Signed)
Well-controlled at home. He will continue metoprolol 50 mg daily, lisinopril-HCTZ 10-12.5 mg daily, and amlodipine 10 mg daily.

## 2022-03-09 NOTE — Assessment & Plan Note (Signed)
Chronic issue. He can continue tramadol 50 mg every 8 hours as needed.

## 2022-03-09 NOTE — Patient Instructions (Signed)
Nice to see you. We can switch you over to [facility].  You will start with 2.5 mg once weekly for 4 weeks.  When you finish that course you will increase the dose to 5 mg weekly.  If you have any side effects or new symptoms with starting this please let us know.  You will discontinue the Ozempic as the Darcel Bayley will take the place of the Ozempic.  Please let me know if the Darcel Bayley is too expensive.

## 2022-03-09 NOTE — Progress Notes (Signed)
[name] Rumps, MD Phone: [phone]  Joseph Singh is a 62 y.o. male who presents today for f/u.  Diabetes: patient is currently taking glipizide 10mg  daily, Jardiance 25mg  daily, Metformin 1000mg  daily, and Ozempic 2mg  weekly. He does not check his blood sugars at home. He denies any hypoglycemic episodes, polyuria, or polydipsia. He is up to date with his eye doctor. Patient believes he recently passed a kidney stone this past weekend and during those three days he did not take his diabetic medications but resumed them two days ago. Patient reports significant back pain with vomiting for a few days. Notes this has resolved. Notes he had peach colored urine with this. Notes a history of a kidney stone. Symptoms resolved at this point.   Chronic pain: patient is requesting a refill of his Tramadol. He states that it is helpful for his back, feet, and hand pain. He denies excessive daytime drowsiness.  Social History   Tobacco Use  Smoking Status Former   Packs/day: 0.50   Years: 35.00   Total pack years: 17.50   Types: Cigarettes   Quit date: 08/09/2018   Years since quitting: 3.5  Smokeless Tobacco Never    Current Outpatient Medications on File Prior to Visit  Medication Sig Dispense Refill   amLODipine (NORVASC) 10 MG tablet TAKE 1 TABLET BY MOUTH DAILY 90 tablet 3   aspirin EC 81 MG tablet Take 1 tablet (81 mg total) by mouth daily. 150 tablet 2   atorvastatin (LIPITOR) 80 MG tablet TAKE ONE TABLET BY MOUTH EVERY DAY 90 tablet 3   blood glucose meter kit and supplies KIT E11.51, check 2x daily, dispense based on patient and insurance preference 1 each 0   clopidogrel (PLAVIX) 75 MG tablet TAKE ONE TABLET BY MOUTH EVERY DAY 90 tablet 3   gabapentin (NEURONTIN) 300 MG capsule TAKE 1 CAPSULE BY MOUTH FOUR TIMES DAILY 360 capsule 2   glipiZIDE (GLUCOTROL XL) 10 MG 24 hr tablet TAKE ONE TABLET BY MOUTH EACH DAY WITH BREAKFAST 90 tablet 3   glucose blood (ONE TOUCH ULTRA TEST) test  strip Check blood sugar twice daily Dx code: E11.9 200 each 0   JARDIANCE 25 MG TABS tablet TAKE 1 TABLET BY MOUTH ONCE DAILY 90 tablet 3   lisinopril-hydrochlorothiazide (ZESTORETIC) 10-12.5 MG tablet TAKE 1 TABLET BY MOUTH DAILY 90 tablet 3   metFORMIN (GLUCOPHAGE) 1000 MG tablet TAKE ONE TABLET BY MOUTH TWICE DAILY WITH A MEAL 90 tablet 1   metoprolol succinate (TOPROL-XL) 50 MG 24 hr tablet TAKE ONE TABLET BY MOUTH ONCE DAILY WITHOR IMMEDIATELY FOLLOWING A MEAL 90 tablet 1   No current facility-administered medications on file prior to visit.     ROS see history of present illness  Objective  Physical Exam Vitals:   03/09/22 1616  BP: 118/70  Pulse: 78  Temp: 98.1 F (36.7 C)  SpO2: 96%    BP Readings from Last 3 Encounters:  03/09/22 118/70  10/06/21 110/80  06/30/21 (!) 150/80   Wt Readings from Last 3 Encounters:  03/09/22 295 lb 3.2 oz (133.9 kg)  10/06/21 (!) 303 lb 9.6 oz (137.7 kg)  06/30/21 (!) 307 lb (139.3 kg)    Physical Exam Constitutional:      Appearance: Normal appearance.  HENT:     Head: Normocephalic and atraumatic.  Cardiovascular:     Rate and Rhythm: Normal rate and regular rhythm.  Pulmonary:     Effort: Pulmonary effort is normal.  Breath sounds: Normal breath sounds.  Skin:    General: Skin is warm and dry.  Neurological:     Mental Status: He is alert.      Assessment/Plan: Please see individual problem list.  Problem List Items Addressed This Visit     Back pain (Chronic)    Chronic issue. He can continue tramadol 50 mg every 8 hours as needed.       Relevant Medications   traMADol (ULTRAM) 50 MG tablet   Diabetic neuropathy [name] (Chronic)   Relevant Medications   tirzepatide (MOUNJARO) 2.5 MG/0.5ML Pen   tirzepatide (MOUNJARO) 5 MG/0.5ML Pen   traMADol (ULTRAM) 50 MG tablet   Essential hypertension (Chronic)    Well-controlled at home. He will continue metoprolol 50 mg daily, lisinopril-HCTZ 10-12.5 mg daily, and  amlodipine 10 mg daily.       Type 2 diabetes mellitus without complication, without long-term current use of insulin [name] - Primary (Chronic)    Checked A1c. It has gone up to 8.3 from 7.3 six months ago. We will try Mounjaro 2.5mg  weekly for four weeks and then move up to 5.0mg  weekly and schedule patient for a follow-up. Advised patient that we have to start back on the lowest dose as he is starting a new medication. Precautions to stop medication and possible side effects discussed with patient such as nausea and abdominal pain. In the meantime he will discontinue Ozempic. He will let us know if Darcel Bayley is too expensive.      Relevant Medications   tirzepatide (MOUNJARO) 2.5 MG/0.5ML Pen   tirzepatide (MOUNJARO) 5 MG/0.5ML Pen   Other Relevant Orders   POCT HgB A1C (Completed)   Kidney stone    Patient likely had a kidney stone causing his symptoms over the weekend.  His symptoms have resolved.  Will check urinalysis.      Relevant Medications   traMADol (ULTRAM) 50 MG tablet   Other Relevant Orders   POCT Urinalysis Dipstick    Health Maintenance: patient declined colonoscopy today. Discussed with patient the importance of colon cancer screening and patient understands the risks.  Return in about 3 months (around 06/08/2022) for Diabetes.   Marisa Cyphers, Medical Student [facility]

## 2022-03-10 DIAGNOSIS — N200 Calculus of kidney: Secondary | ICD-10-CM | POA: Insufficient documentation

## 2022-03-10 LAB — URINALYSIS, MICROSCOPIC ONLY

## 2022-03-10 LAB — POCT URINALYSIS DIPSTICK
Bilirubin, UA: NEGATIVE
Glucose, UA: POSITIVE — AB
Ketones, UA: NEGATIVE
Nitrite, UA: NEGATIVE
Protein, UA: POSITIVE — AB
Spec Grav, UA: <=1.005 — AB (ref 1.010–1.025)
Urobilinogen, UA: 0.2 E.U./dL
pH, UA: 5.5 (ref 5.0–8.0)

## 2022-03-10 NOTE — Progress Notes (Signed)
Patient seen along with medical student Zada Zhong.  I personally evaluated this patient along with the student, and verified all aspects of the history, physical exam, and medical decision making as documented by the student.  I agree with the student's documentation and have made all necessary edits.  Savhanna Sliva, MD  

## 2022-03-10 NOTE — Assessment & Plan Note (Signed)
Patient likely had a kidney stone causing his symptoms over the weekend.  His symptoms have resolved.  Will check urinalysis.

## 2022-03-10 NOTE — Addendum Note (Signed)
Addended by: Leone Haven on: 03/10/2022 08:29 AM   Modules accepted: Orders

## 2022-03-11 ENCOUNTER — Encounter: Payer: Self-pay | Admitting: Family Medicine

## 2022-03-11 LAB — URINE CULTURE
MICRO NUMBER:: 14443774
SPECIMEN QUALITY:: ADEQUATE

## 2022-03-21 ENCOUNTER — Telehealth: Payer: Self-pay | Admitting: Family Medicine

## 2022-03-21 NOTE — Telephone Encounter (Signed)
Opal Sidles from [facility] need office notes to verify pt has diabetes faxed ASAP [phone] Fax [phone]

## 2022-03-21 NOTE — Telephone Encounter (Signed)
Office note faxed on 03/21/2022

## 2022-03-26 ENCOUNTER — Encounter: Payer: Self-pay | Admitting: Family Medicine

## 2022-03-26 DIAGNOSIS — R3129 Other microscopic hematuria: Secondary | ICD-10-CM

## 2022-03-30 ENCOUNTER — Encounter: Payer: Self-pay | Admitting: Family Medicine

## 2022-03-30 ENCOUNTER — Ambulatory Visit
Admission: RE | Admit: 2022-03-30 | Discharge: 2022-03-30 | Disposition: A | Discharge status: Home / Self Care | Payer: 59 | Source: Ambulatory Visit | Attending: Family Medicine | Admitting: Family Medicine

## 2022-03-30 ENCOUNTER — Encounter: Payer: Self-pay | Admitting: Internal Medicine

## 2022-03-30 ENCOUNTER — Ambulatory Visit (INDEPENDENT_AMBULATORY_CARE_PROVIDER_SITE_OTHER): Payer: 59

## 2022-03-30 ENCOUNTER — Observation Stay: Payer: 59

## 2022-03-30 ENCOUNTER — Inpatient Hospital Stay
Admission: EM | Admit: 2022-03-30 | Discharge: 2022-04-03 | DRG: 660 | Disposition: A | Discharge status: Home / Self Care | Payer: 59 | Attending: Student | Admitting: Student

## 2022-03-30 ENCOUNTER — Other Ambulatory Visit: Payer: Self-pay

## 2022-03-30 ENCOUNTER — Ambulatory Visit (INDEPENDENT_AMBULATORY_CARE_PROVIDER_SITE_OTHER): Payer: 59 | Admitting: Family Medicine

## 2022-03-30 VITALS — BP 120/70 | HR 100 | Temp 98.8°F | Ht 76.0 in | Wt 285.2 lb

## 2022-03-30 DIAGNOSIS — Z8249 Family history of ischemic heart disease and other diseases of the circulatory system: Secondary | ICD-10-CM

## 2022-03-30 DIAGNOSIS — R7401 Elevation of levels of liver transaminase levels: Secondary | ICD-10-CM | POA: Diagnosis present

## 2022-03-30 DIAGNOSIS — I739 Peripheral vascular disease, unspecified: Secondary | ICD-10-CM | POA: Diagnosis present

## 2022-03-30 DIAGNOSIS — Z7902 Long term (current) use of antithrombotics/antiplatelets: Secondary | ICD-10-CM

## 2022-03-30 DIAGNOSIS — E1151 Type 2 diabetes mellitus with diabetic peripheral angiopathy without gangrene: Secondary | ICD-10-CM | POA: Diagnosis present

## 2022-03-30 DIAGNOSIS — Z79899 Other long term (current) drug therapy: Secondary | ICD-10-CM

## 2022-03-30 DIAGNOSIS — N202 Calculus of kidney with calculus of ureter: Secondary | ICD-10-CM | POA: Diagnosis present

## 2022-03-30 DIAGNOSIS — R1031 Right lower quadrant pain: Secondary | ICD-10-CM | POA: Diagnosis not present

## 2022-03-30 DIAGNOSIS — R0609 Other forms of dyspnea: Secondary | ICD-10-CM

## 2022-03-30 DIAGNOSIS — N1330 Unspecified hydronephrosis: Secondary | ICD-10-CM

## 2022-03-30 DIAGNOSIS — E119 Type 2 diabetes mellitus without complications: Secondary | ICD-10-CM | POA: Diagnosis not present

## 2022-03-30 DIAGNOSIS — Z801 Family history of malignant neoplasm of trachea, bronchus and lung: Secondary | ICD-10-CM

## 2022-03-30 DIAGNOSIS — E871 Hypo-osmolality and hyponatremia: Secondary | ICD-10-CM

## 2022-03-30 DIAGNOSIS — B9561 Methicillin susceptible Staphylococcus aureus infection as the cause of diseases classified elsewhere: Secondary | ICD-10-CM | POA: Diagnosis present

## 2022-03-30 DIAGNOSIS — Z833 Family history of diabetes mellitus: Secondary | ICD-10-CM

## 2022-03-30 DIAGNOSIS — E669 Obesity, unspecified: Secondary | ICD-10-CM | POA: Diagnosis present

## 2022-03-30 DIAGNOSIS — Z7984 Long term (current) use of oral hypoglycemic drugs: Secondary | ICD-10-CM

## 2022-03-30 DIAGNOSIS — M545 Low back pain, unspecified: Secondary | ICD-10-CM | POA: Diagnosis present

## 2022-03-30 DIAGNOSIS — Z89422 Acquired absence of other left toe(s): Secondary | ICD-10-CM

## 2022-03-30 DIAGNOSIS — N390 Urinary tract infection, site not specified: Secondary | ICD-10-CM | POA: Diagnosis present

## 2022-03-30 DIAGNOSIS — E11610 Type 2 diabetes mellitus with diabetic neuropathic arthropathy: Secondary | ICD-10-CM | POA: Diagnosis present

## 2022-03-30 DIAGNOSIS — N201 Calculus of ureter: Secondary | ICD-10-CM

## 2022-03-30 DIAGNOSIS — Z7982 Long term (current) use of aspirin: Secondary | ICD-10-CM

## 2022-03-30 DIAGNOSIS — Z87442 Personal history of urinary calculi: Secondary | ICD-10-CM

## 2022-03-30 DIAGNOSIS — I10 Essential (primary) hypertension: Secondary | ICD-10-CM | POA: Diagnosis present

## 2022-03-30 DIAGNOSIS — Z1152 Encounter for screening for COVID-19: Secondary | ICD-10-CM

## 2022-03-30 DIAGNOSIS — Z803 Family history of malignant neoplasm of breast: Secondary | ICD-10-CM

## 2022-03-30 DIAGNOSIS — N136 Pyonephrosis: Principal | ICD-10-CM | POA: Diagnosis present

## 2022-03-30 DIAGNOSIS — E559 Vitamin D deficiency, unspecified: Secondary | ICD-10-CM | POA: Diagnosis present

## 2022-03-30 DIAGNOSIS — Z6834 Body mass index (BMI) 34.0-34.9, adult: Secondary | ICD-10-CM

## 2022-03-30 DIAGNOSIS — Z87891 Personal history of nicotine dependence: Secondary | ICD-10-CM

## 2022-03-30 DIAGNOSIS — E1142 Type 2 diabetes mellitus with diabetic polyneuropathy: Secondary | ICD-10-CM | POA: Diagnosis present

## 2022-03-30 DIAGNOSIS — E1165 Type 2 diabetes mellitus with hyperglycemia: Secondary | ICD-10-CM | POA: Diagnosis present

## 2022-03-30 DIAGNOSIS — R7989 Other specified abnormal findings of blood chemistry: Secondary | ICD-10-CM

## 2022-03-30 DIAGNOSIS — E785 Hyperlipidemia, unspecified: Secondary | ICD-10-CM | POA: Diagnosis present

## 2022-03-30 DIAGNOSIS — N200 Calculus of kidney: Secondary | ICD-10-CM

## 2022-03-30 DIAGNOSIS — R1110 Vomiting, unspecified: Secondary | ICD-10-CM

## 2022-03-30 DIAGNOSIS — R197 Diarrhea, unspecified: Secondary | ICD-10-CM

## 2022-03-30 DIAGNOSIS — R748 Abnormal levels of other serum enzymes: Secondary | ICD-10-CM

## 2022-03-30 DIAGNOSIS — N179 Acute kidney failure, unspecified: Secondary | ICD-10-CM

## 2022-03-30 DIAGNOSIS — G8929 Other chronic pain: Secondary | ICD-10-CM | POA: Diagnosis present

## 2022-03-30 DIAGNOSIS — K921 Melena: Secondary | ICD-10-CM | POA: Diagnosis not present

## 2022-03-30 DIAGNOSIS — K828 Other specified diseases of gallbladder: Secondary | ICD-10-CM | POA: Diagnosis present

## 2022-03-30 DIAGNOSIS — D509 Iron deficiency anemia, unspecified: Secondary | ICD-10-CM | POA: Diagnosis present

## 2022-03-30 LAB — CBC WITH DIFFERENTIAL/PLATELET
Abs Immature Granulocytes: 0.15 10*3/uL — ABNORMAL HIGH (ref 0.00–0.07)
Basophils Absolute: 0.1 10*3/uL (ref 0.0–0.1)
Basophils Absolute: 0 10*3/uL (ref 0.0–0.1)
Basophils Relative: 1.2 % (ref 0.0–3.0)
Basophils Relative: 0 %
Eosinophils Absolute: 0.1 10*3/uL (ref 0.0–0.7)
Eosinophils Absolute: 0.1 10*3/uL (ref 0.0–0.5)
Eosinophils Relative: 0.5 % (ref 0.0–5.0)
Eosinophils Relative: 1 %
HCT: 39.1 % (ref 39.0–52.0)
HCT: 39.7 % (ref 39.0–52.0)
Hemoglobin: 13.3 g/dL (ref 13.0–17.0)
Hemoglobin: 13 g/dL (ref 13.0–17.0)
Immature Granulocytes: 2 %
Lymphocytes Relative: 6.1 % — ABNORMAL LOW (ref 12.0–46.0)
Lymphocytes Relative: 11 %
Lymphs Abs: 0.8 10*3/uL (ref 0.7–4.0)
Lymphs Abs: 1.1 10*3/uL (ref 0.7–4.0)
MCH: 29.8 pg (ref 26.0–34.0)
MCHC: 33.9 g/dL (ref 30.0–36.0)
MCHC: 32.7 g/dL (ref 30.0–36.0)
MCV: 90.7 fl (ref 78.0–100.0)
MCV: 91.1 fL (ref 80.0–100.0)
Monocytes Absolute: 1.2 10*3/uL — ABNORMAL HIGH (ref 0.1–1.0)
Monocytes Absolute: 1 10*3/uL (ref 0.1–1.0)
Monocytes Relative: 9.6 % (ref 3.0–12.0)
Monocytes Relative: 10 %
Neutro Abs: 10.5 10*3/uL — ABNORMAL HIGH (ref 1.4–7.7)
Neutro Abs: 8 10*3/uL — ABNORMAL HIGH (ref 1.7–7.7)
Neutrophils Relative %: 82.6 % — ABNORMAL HIGH (ref 43.0–77.0)
Neutrophils Relative %: 76 %
Platelets: 337 10*3/uL (ref 150.0–400.0)
Platelets: 304 10*3/uL (ref 150–400)
RBC: 4.32 Mil/uL (ref 4.22–5.81)
RBC: 4.36 MIL/uL (ref 4.22–5.81)
RDW: 14.2 % (ref 11.5–15.5)
RDW: 13.3 % (ref 11.5–15.5)
WBC: 12.7 10*3/uL — ABNORMAL HIGH (ref 4.0–10.5)
WBC: 10.3 10*3/uL (ref 4.0–10.5)
nRBC: 0 % (ref 0.0–0.2)

## 2022-03-30 LAB — COMPREHENSIVE METABOLIC PANEL
ALT: 42 U/L (ref 0–53)
ALT: 55 U/L — ABNORMAL HIGH (ref 0–44)
AST: 34 U/L (ref 0–37)
AST: 69 U/L — ABNORMAL HIGH (ref 15–41)
Albumin: 3 g/dL — ABNORMAL LOW (ref 3.5–5.2)
Albumin: 2.4 g/dL — ABNORMAL LOW (ref 3.5–5.0)
Alkaline Phosphatase: 107 U/L (ref 39–117)
Alkaline Phosphatase: 104 U/L (ref 38–126)
Anion gap: 11 (ref 5–15)
BUN: 31 mg/dL — ABNORMAL HIGH (ref 6–23)
BUN: 33 mg/dL — ABNORMAL HIGH (ref 8–23)
CO2: 24 mEq/L (ref 19–32)
CO2: 24 mmol/L (ref 22–32)
Calcium: 8.3 mg/dL — ABNORMAL LOW (ref 8.4–10.5)
Calcium: 8.2 mg/dL — ABNORMAL LOW (ref 8.9–10.3)
Chloride: 88 mEq/L — ABNORMAL LOW (ref 96–112)
Chloride: 91 mmol/L — ABNORMAL LOW (ref 98–111)
Creatinine, Ser: 1.25 mg/dL (ref 0.40–1.50)
Creatinine, Ser: 1.37 mg/dL — ABNORMAL HIGH (ref 0.61–1.24)
GFR, Estimated: 58 mL/min — ABNORMAL LOW (ref >60)
GFR: 61.45 mL/min (ref >60.00)
Glucose, Bld: 227 mg/dL — ABNORMAL HIGH (ref 70–99)
Glucose, Bld: 236 mg/dL — ABNORMAL HIGH (ref 70–99)
Potassium: 4.5 mEq/L (ref 3.5–5.1)
Potassium: 4 mmol/L (ref 3.5–5.1)
Sodium: 126 mEq/L — ABNORMAL LOW (ref 135–145)
Sodium: 126 mmol/L — ABNORMAL LOW (ref 135–145)
Total Bilirubin: 0.7 mg/dL (ref 0.2–1.2)
Total Bilirubin: 0.7 mg/dL (ref 0.3–1.2)
Total Protein: 7 g/dL (ref 6.0–8.3)
Total Protein: 7.2 g/dL (ref 6.5–8.1)

## 2022-03-30 LAB — POCT URINALYSIS DIPSTICK
Bilirubin, UA: NEGATIVE
Blood, UA: POSITIVE
Glucose, UA: POSITIVE — AB
Ketones, UA: 1
Nitrite, UA: NEGATIVE
Protein, UA: POSITIVE — AB
Spec Grav, UA: 1.015 (ref 1.010–1.025)
Urobilinogen, UA: 1 E.U./dL
pH, UA: 5.5 (ref 5.0–8.0)

## 2022-03-30 LAB — RESP PANEL BY RT-PCR (RSV, FLU A&B, COVID)  RVPGX2
Influenza A by PCR: NEGATIVE
Influenza B by PCR: NEGATIVE
Resp Syncytial Virus by PCR: NEGATIVE
SARS Coronavirus 2 by RT PCR: NEGATIVE

## 2022-03-30 LAB — URINALYSIS, MICROSCOPIC ONLY

## 2022-03-30 LAB — URINALYSIS, ROUTINE W REFLEX MICROSCOPIC
Bacteria, UA: NONE SEEN
Bilirubin Urine: NEGATIVE
Glucose, UA: >=500 mg/dL — AB
Ketones, ur: NEGATIVE mg/dL
Nitrite: NEGATIVE
Protein, ur: NEGATIVE mg/dL
Specific Gravity, Urine: 1.023 (ref 1.005–1.030)
pH: 5 (ref 5.0–8.0)

## 2022-03-30 LAB — LIPASE: Lipase: 149 U/L — ABNORMAL HIGH (ref 11.0–59.0)

## 2022-03-30 LAB — GLUCOSE, CAPILLARY: Glucose-Capillary: 198 mg/dL — ABNORMAL HIGH (ref 70–99)

## 2022-03-30 LAB — LIPASE, BLOOD: Lipase: 60 U/L — ABNORMAL HIGH (ref 11–51)

## 2022-03-30 MED ORDER — ATORVASTATIN CALCIUM 20 MG PO TABS
80.0000 mg | ORAL_TABLET | Freq: Every day | ORAL | Status: DC
  Administered 2022-03-31 – 2022-04-03 (×3): 80 mg via ORAL
  Filled 2022-03-30 (×6): qty 4

## 2022-03-30 MED ORDER — SODIUM CHLORIDE 0.9 % IV SOLN
1.0000 g | INTRAVENOUS | Status: DC
  Administered 2022-03-31: 1 g via INTRAVENOUS
  Filled 2022-03-30: qty 10
  Filled 2022-03-30: qty 1

## 2022-03-30 MED ORDER — SODIUM CHLORIDE 0.9 % IV BOLUS
1000.0000 mL | Freq: Once | INTRAVENOUS | Status: AC
  Administered 2022-03-30: 1000 mL via INTRAVENOUS

## 2022-03-30 MED ORDER — AMLODIPINE BESYLATE 10 MG PO TABS
10.0000 mg | ORAL_TABLET | Freq: Every day | ORAL | Status: DC
  Administered 2022-03-31 – 2022-04-03 (×3): 10 mg via ORAL
  Filled 2022-03-30 (×6): qty 1

## 2022-03-30 MED ORDER — HYDROCODONE-ACETAMINOPHEN 5-325 MG PO TABS
1.0000 | ORAL_TABLET | ORAL | Status: DC | PRN
  Administered 2022-03-30 – 2022-04-01 (×6): 2 via ORAL
  Administered 2022-04-02: 1 via ORAL
  Administered 2022-04-02 (×2): 2 via ORAL
  Administered 2022-04-02: 1 via ORAL
  Administered 2022-04-03 (×2): 2 via ORAL
  Filled 2022-03-30 (×4): qty 2
  Filled 2022-03-30: qty 1
  Filled 2022-03-30 (×7): qty 2
  Filled 2022-03-30: qty 1

## 2022-03-30 MED ORDER — MORPHINE SULFATE (PF) 2 MG/ML IV SOLN: 2.0000 mg | INTRAVENOUS | Status: DC | PRN

## 2022-03-30 MED ORDER — METOPROLOL SUCCINATE ER 50 MG PO TB24
50.0000 mg | ORAL_TABLET | Freq: Every day | ORAL | Status: DC
  Administered 2022-03-31 – 2022-04-03 (×3): 50 mg via ORAL
  Filled 2022-03-30 (×6): qty 1

## 2022-03-30 MED ORDER — SODIUM CHLORIDE 0.9 % IV SOLN: INTRAVENOUS | Status: DC

## 2022-03-30 MED ORDER — ONDANSETRON HCL 4 MG/2ML IJ SOLN: 4.0000 mg | Freq: Four times a day (QID) | INTRAMUSCULAR | Status: DC | PRN

## 2022-03-30 MED ORDER — ACETAMINOPHEN 325 MG PO TABS: 650.0000 mg | ORAL_TABLET | Freq: Four times a day (QID) | ORAL | Status: DC | PRN

## 2022-03-30 MED ORDER — INSULIN ASPART 100 UNIT/ML IJ SOLN
0.0000 [IU] | INTRAMUSCULAR | Status: DC
  Administered 2022-03-30: 3 [IU] via SUBCUTANEOUS
  Administered 2022-03-31 (×3): 5 [IU] via SUBCUTANEOUS
  Administered 2022-03-31: 3 [IU] via SUBCUTANEOUS
  Administered 2022-04-01: 2 [IU] via SUBCUTANEOUS
  Administered 2022-04-01: 8 [IU] via SUBCUTANEOUS
  Administered 2022-04-01 (×3): 3 [IU] via SUBCUTANEOUS
  Administered 2022-04-01 – 2022-04-02 (×4): 8 [IU] via SUBCUTANEOUS
  Administered 2022-04-02: 5 [IU] via SUBCUTANEOUS
  Administered 2022-04-02: 11 [IU] via SUBCUTANEOUS
  Administered 2022-04-02: 8 [IU] via SUBCUTANEOUS
  Administered 2022-04-03 (×2): 3 [IU] via SUBCUTANEOUS
  Filled 2022-03-30 (×19): qty 1

## 2022-03-30 MED ORDER — ONDANSETRON HCL 4 MG PO TABS: 4.0000 mg | ORAL_TABLET | Freq: Four times a day (QID) | ORAL | Status: DC | PRN

## 2022-03-30 MED ORDER — SODIUM CHLORIDE 0.9 % IV SOLN
1.0000 g | Freq: Once | INTRAVENOUS | Status: AC
  Administered 2022-03-30: 1 g via INTRAVENOUS
  Filled 2022-03-30: qty 10

## 2022-03-30 MED ORDER — ACETAMINOPHEN 650 MG RE SUPP: 650.0000 mg | Freq: Four times a day (QID) | RECTAL | Status: DC | PRN

## 2022-03-30 MED ORDER — ASPIRIN 81 MG PO TBEC
81.0000 mg | DELAYED_RELEASE_TABLET | Freq: Every day | ORAL | Status: DC
  Administered 2022-03-31 – 2022-04-03 (×3): 81 mg via ORAL
  Filled 2022-03-30 (×6): qty 1

## 2022-03-30 NOTE — Assessment & Plan Note (Addendum)
Abnormal LFTs, elevated lipase Differential includes acute gastroenteritis, gallbladder disease, Mounjaro side effect or UTI as above Patient reports vomiting and diarrhea started after initiation of Mounjaro however does have a fever and elevated lipase and LFTs CT abdomen and pelvis showed distended gallbladder without cholelithiasis or inflammatory changes and no biliary dilatation Follow-up right upper quadrant ultrasound to evaluate for gallstones Clear liquid diet Antiemetics, IV hydration and supportive care

## 2022-03-30 NOTE — H&P (Signed)
History and Physical    Patient: Joseph Singh GLO:[id number] DOB: January 28, 1961 DOA: 03/30/2022 DOS: the patient was seen and examined on 03/30/2022 PCP: Leone Haven, MD  Patient coming from: Home  Chief Complaint:  Chief Complaint  Patient presents with   Flank Pain    HPI: Joseph Singh is a 62 y.o. male with medical history significant for DM, peripheral neuropathy with left Charcot foot, HTN, nephrolithiasis, who was sent to the ED by his PCP for evaluation of nausea vomiting and diarrhea with fever and flank pain with abnormal labs and CT scan. Patient notes a 3-week history of intermittent nonbloody nonbilious vomiting and then over the past few days developed nonbloody diarrhea associated with fever up to 102, urinary urgency and worsening of her chronic right low back pain.  He notes that the symptoms started around initiation of Mounjaro which she subsequently stopped on 03/21/2022.  He denies chest pain, cough, shortness of breath. Blood work done at the [facility] office was significant for WBC of 12,000, glucose 227, sodium 126, lipase 149.  LFTs normal.  Urinalysis showed positive glucose and protein but negative nitrite, rare bacteria, greater than 50 WBCs per hpf. Outpatient CT renal stone study showed the following: IMPRESSION: "Moderate right hydronephrosis is noted secondary to 11 mm calculus n the region of the right ureteropelvic junction.Mild gallbladder distention is noted without cholelithiasis or inflammatory changes. No biliary dilatation is noted." Patient was subsequently sent to the ED for f further evaluation Upon arrival to the ED, Vitals were within normal limits. Labs were repeated by which time CBC had normalized.  Lipase was 60 down from 159.  CMP was significant for elevated transaminases with AST 69 and ALT 55 which were normal earlier in the day.  Creatinine was 1.37 (baseline 0.79) glucose 236.  Sodium 126.  The ED provider spoke with urologist, Dr.  Bernardo Heater who reviewed the images from outpatient CAT scan and opined that patient did not need urgent stent but will see patient later in the day.  Patient was given an IV fluid bolus and started on ceftriaxone and hospitalist consulted for admission.   Review of Systems: As mentioned in the history of present illness. All other systems reviewed and are negative.  Past Medical History:  Diagnosis Date   Arthritis    Diabetes mellitus without complication ([place])    History of kidney stones    h/o   Hyperlipidemia    Hypertension    Neuropathy    Past Surgical History:  Procedure Laterality Date   AMPUTATION TOE Left 08/10/2018   Procedure: RAY LEFT;  Surgeon: Samara Deist, DPM;  Location: [facility];  Service: Podiatry;  Laterality: Left;   ARTHRODESIS METATARSAL Left 02/01/2019   Procedure: ARTHRODESIS METATARSAL;  Surgeon: Samara Deist, DPM;  Location: [facility];  Service: Podiatry;  Laterality: Left;   FOOT ARTHRODESIS Left 02/01/2019   Procedure: ARTHRODESIS FOOT;STJ;  Surgeon: Samara Deist, DPM;  Location: [facility];  Service: Podiatry;  Laterality: Left;   IRRIGATION AND DEBRIDEMENT FOOT Left 10/26/2018   Procedure: IRRIGATION AND DEBRIDEMENT FOOT, LEFT;  Surgeon: Samara Deist, DPM;  Location: [facility];  Service: Podiatry;  Laterality: Left;   LOWER EXTREMITY ANGIOGRAPHY Right 02/25/2016   Procedure: Lower Extremity Angiography;  Surgeon: Algernon Huxley, MD;  Location: [place] [facility];  Service: Cardiovascular;  Laterality: Right;   LOWER EXTREMITY ANGIOGRAPHY Right 02/27/2017   Procedure: LOWER EXTREMITY ANGIOGRAPHY;  Surgeon: Algernon Huxley, MD;  Location: [place] [facility];  Service: Cardiovascular;  Laterality: Right;   LOWER EXTREMITY ANGIOGRAPHY Left 08/06/2018   Procedure: LOWER EXTREMITY ANGIOGRAPHY;  Surgeon: Annice Needy, MD;  Location: [facility];  Service: Cardiovascular;  Laterality: Left;   LOWER EXTREMITY INTERVENTION  02/25/2016   Procedure: Lower Extremity  Intervention;  Surgeon: Annice Needy, MD;  Location: [facility];  Service: Cardiovascular;;   TOE AMPUTATION     Social History:  reports that he quit smoking about 3 years ago. His smoking use included cigarettes. He has a 17.50 pack-year smoking history. He has never used smokeless tobacco. He reports that he does not drink alcohol and does not use drugs.  No Known Allergies  Family History  Problem Relation Age of Onset   Diabetes Mother    Heart disease Father    Diabetes Sister    Diabetes Brother    Cancer Sister        lung   Cancer Sister        breast    Prior to Admission medications   Medication Sig Start Date End Date Taking? Authorizing Provider  amLODipine (NORVASC) 10 MG tablet TAKE 1 TABLET BY MOUTH DAILY 06/28/21   Glori Luis, MD  aspirin EC 81 MG tablet Take 1 tablet (81 mg total) by mouth daily. 02/28/17   Annice Needy, MD  atorvastatin (LIPITOR) 80 MG tablet TAKE ONE TABLET BY MOUTH EVERY DAY 07/15/21   Glori Luis, MD  blood glucose meter kit and supplies KIT E11.51, check 2x daily, dispense based on patient and insurance preference 05/03/17   Glori Luis, MD  clopidogrel (PLAVIX) 75 MG tablet TAKE ONE TABLET BY MOUTH EVERY DAY 09/02/21   Glori Luis, MD  gabapentin (NEURONTIN) 300 MG capsule TAKE 1 CAPSULE BY MOUTH FOUR TIMES DAILY 08/13/21   Glori Luis, MD  glipiZIDE (GLUCOTROL XL) 10 MG 24 hr tablet TAKE ONE TABLET BY MOUTH EACH DAY WITH BREAKFAST 09/02/21   Glori Luis, MD  glucose blood (ONE TOUCH ULTRA TEST) test strip Check blood sugar twice daily Dx code: E11.9 11/07/17   Glori Luis, MD  JARDIANCE 25 MG TABS tablet TAKE 1 TABLET BY MOUTH ONCE DAILY 11/26/21   Glori Luis, MD  lisinopril-hydrochlorothiazide (ZESTORETIC) 10-12.5 MG tablet TAKE 1 TABLET BY MOUTH DAILY 08/30/21   Glori Luis, MD  metFORMIN (GLUCOPHAGE) 1000 MG tablet TAKE ONE TABLET BY MOUTH TWICE DAILY WITH A MEAL 09/25/21   Worthy Rancher B, FNP  metoprolol succinate (TOPROL-XL) 50 MG 24 hr tablet TAKE ONE TABLET BY MOUTH ONCE DAILY WITHOR IMMEDIATELY FOLLOWING A MEAL 11/25/21   Glori Luis, MD  tirzepatide [facility]) 2.5 MG/0.5ML Pen Inject 2.5 mg into the skin once a week for 28 days. 03/09/22 04/06/22  Glori Luis, MD  tirzepatide [facility]) 5 MG/0.5ML Pen Inject 5 mg into the skin once a week. Start after finishing the 28 days of the 2.5 mg  dose. 03/09/22   Glori Luis, MD  traMADol (ULTRAM) 50 MG tablet TAKE 1 TABLET BY MOUTH EVERY 8 HOURS AS NEEDED. FOR MODERATE PAIN 03/09/22   Glori Luis, MD    Physical Exam: Vitals:   03/30/22 1619 03/30/22 1620 03/30/22 1900  BP: (!) 143/71  130/62  Pulse: 84  74  Resp: 18  16  Temp: 98 F (36.7 C)  98 F (36.7 C)  TempSrc: Oral    SpO2: 98%  98%  Weight:  129.4  kg   Height:  6\' 4"  (1.93 m)    Physical Exam Vitals and nursing note reviewed.  Constitutional:      General: He is not in acute distress.    Appearance: He is obese.  HENT:     Head: Normocephalic and atraumatic.  Cardiovascular:     Rate and Rhythm: Normal rate and regular rhythm.     Heart sounds: Normal heart sounds.  Pulmonary:     Effort: Pulmonary effort is normal.     Breath sounds: Normal breath sounds.  Abdominal:     Palpations: Abdomen is soft.     Tenderness: There is no abdominal tenderness.  Neurological:     Mental Status: Mental status is at baseline.     Labs on Admission: I have personally reviewed following labs and imaging studies  CBC: Recent Labs  Lab 03/30/22 0904 03/30/22 1754  WBC 12.7* 10.3  NEUTROABS 10.5* 8.0*  HGB 13.3 13.0  HCT 39.1 39.7  MCV 90.7 91.1  PLT 337.0 500   Basic Metabolic Panel: Recent Labs  Lab 03/30/22 0904 03/30/22 1754  NA 126* 126*  K 4.5 4.0  CL 88* 91*  CO2 24 24  GLUCOSE 227* 236*  BUN 31* 33*  CREATININE 1.25 1.37*  CALCIUM 8.3* 8.2*   GFR: Estimated Creatinine Clearance: 81 mL/min (A) (by C-G  formula based on SCr of 1.37 mg/dL (H)). Liver Function Tests: Recent Labs  Lab 03/30/22 0904 03/30/22 1754  AST 34 69*  ALT 42 55*  ALKPHOS 107 104  BILITOT 0.7 0.7  PROT 7.0 7.2  ALBUMIN 3.0* 2.4*   Recent Labs  Lab 03/30/22 0904 03/30/22 1754  LIPASE 149.0* 60*   No results for input(s): "AMMONIA" in the last 168 hours. Coagulation Profile: No results for input(s): "INR", "PROTIME" in the last 168 hours. Cardiac Enzymes: No results for input(s): "CKTOTAL", "CKMB", "CKMBINDEX", "TROPONINI" in the last 168 hours. BNP (last 3 results) No results for input(s): "PROBNP" in the last 8760 hours. HbA1C: No results for input(s): "HGBA1C" in the last 72 hours. CBG: No results for input(s): "GLUCAP" in the last 168 hours. Lipid Profile: No results for input(s): "CHOL", "HDL", "LDLCALC", "TRIG", "CHOLHDL", "LDLDIRECT" in the last 72 hours. Thyroid Function Tests: No results for input(s): "TSH", "T4TOTAL", "FREET4", "T3FREE", "THYROIDAB" in the last 72 hours. Anemia Panel: No results for input(s): "VITAMINB12", "FOLATE", "FERRITIN", "TIBC", "IRON", "RETICCTPCT" in the last 72 hours. Urine analysis:    Component Value Date/Time   COLORURINE YELLOW (A) 02/02/2019 0935   APPEARANCEUR CLEAR (A) 02/02/2019 0935   LABSPEC 1.030 02/02/2019 0935   PHURINE 5.0 02/02/2019 0935   GLUCOSEU >=500 (A) 02/02/2019 0935   HGBUR NEGATIVE 02/02/2019 0935   BILIRUBINUR negative 03/30/2022 0914   KETONESUR NEGATIVE 02/02/2019 0935   PROTEINUR Positive (A) 03/30/2022 0914   PROTEINUR NEGATIVE 02/02/2019 0935   UROBILINOGEN 1.0 03/30/2022 0914   NITRITE negative 03/30/2022 0914   NITRITE NEGATIVE 02/02/2019 0935   LEUKOCYTESUR Trace (A) 03/30/2022 0914   LEUKOCYTESUR NEGATIVE 02/02/2019 0935    Radiological Exams on Admission: CT RENAL STONE STUDY  Result Date: 03/30/2022 CLINICAL DATA:  Right lower quadrant abdominal pain. EXAM: CT ABDOMEN AND PELVIS WITHOUT CONTRAST TECHNIQUE:  Multidetector CT imaging of the abdomen and pelvis was performed following the standard protocol without IV contrast. RADIATION DOSE REDUCTION: This exam was performed according to the departmental dose-optimization program which includes automated exposure control, adjustment of the mA and/or kV according to patient size and/or use of  iterative reconstruction technique. COMPARISON:  None Available. FINDINGS: Lower chest: No acute abnormality. Hepatobiliary: No cholelithiasis is noted. Mild gallbladder distention is noted. Liver is grossly unremarkable on these unenhanced images. Pancreas: Unremarkable. No pancreatic ductal dilatation or surrounding inflammatory changes. Spleen: Normal in size without focal abnormality. Adrenals/Urinary Tract: Adrenal glands appear normal. Moderate right hydronephrosis is noted secondary to 11 mm calculus in the region of the right ureteropelvic junction. Urinary bladder is unremarkable. Stomach/Bowel: Stomach is within normal limits. Appendix appears normal. No evidence of bowel wall thickening, distention, or inflammatory changes. Vascular/Lymphatic: Aortic atherosclerosis. No enlarged abdominal or pelvic lymph nodes. Reproductive: Prostate is unremarkable. Other: No abdominal wall hernia or abnormality. No abdominopelvic ascites. Musculoskeletal: No acute or significant osseous findings. IMPRESSION: Moderate right hydronephrosis is noted secondary to 11 mm calculus in the region of the right ureteropelvic junction. Mild gallbladder distention is noted without cholelithiasis or inflammatory changes. No biliary dilatation is noted. Aortic Atherosclerosis (ICD10-I70.0). Electronically Signed   By: Marijo Conception M.D.   On: 03/30/2022 09:48     Data Reviewed: Relevant notes from primary care and specialist visits, past discharge summaries as available in EHR, including Care Everywhere. Prior diagnostic testing as pertinent to current admission diagnoses Updated medications and  problem lists for reconciliation ED course, including vitals, labs, imaging, treatment and response to treatment Triage notes, nursing and pharmacy notes and ED provider's notes Notable results as noted in HPI   Assessment and Plan: Obstruction of right ureteropelvic junction (UPJ) due to stone Hydronephrosis of right kidney Possible urinary tract infection UA not overwhelmingly convincing of UTI but given 3000 and elevated WBC will treat as UTI IV Rocephin Follow cultures IV hydration Urologist, Dr. Bernardo Heater was consulted from the ED and will see patient in the a.m.  Vomiting and diarrhea Abnormal LFTs, elevated lipase Differential includes acute gastroenteritis, gallbladder disease, Mounjaro side effect or UTI as above Patient reports vomiting and diarrhea started after initiation of Mounjaro however does have a fever and elevated lipase and LFTs CT abdomen and pelvis showed distended gallbladder without cholelithiasis or inflammatory changes and no biliary dilatation Follow-up right upper quadrant ultrasound to evaluate for gallstones Clear liquid diet Antiemetics, IV hydration and supportive care  AKI (acute kidney injury) ([place]) Creatinine was 1.37 above baseline of 0.79 IV hydration Monitor renal function and avoid nephrotoxins  Uncontrolled type 2 diabetes mellitus with hyperglycemia, without long-term current use of insulin [name] Sliding scale insulin coverage Will hold off on Jardiance and glipizide  PVD (peripheral vascular disease) [name] Continue aspirin and atorvastatin  Essential hypertension Continue amlodipine and metoprolol.  Will hold home lisinopril HCTZ    DVT prophylaxis:   Consults: Urology, Dr. Bess Kinds  Advance Care Planning:   Code Status: Prior   Family Communication: none  Disposition Plan: Back to previous home environment  Severity of Illness: The appropriate patient status for this patient is OBSERVATION. Observation status is judged to be  reasonable and necessary in order to provide the required intensity of service to ensure the patient's safety. The patient's presenting symptoms, physical exam findings, and initial radiographic and laboratory data in the context of their medical condition is felt to place them at decreased risk for further clinical deterioration. Furthermore, it is anticipated that the patient will be medically stable for discharge from the hospital within 2 midnights of admission.   Author: Athena Masse, MD 03/30/2022 7:43 PM  For on call review [email or web address].

## 2022-03-30 NOTE — ED Provider Notes (Signed)
[facility] Emergency Department Provider Note     Event Date/Time   First MD Initiated Contact with Patient 03/30/22 1737     (approximate)   History   Flank Pain   HPI  Joseph Singh is a 62 y.o. male with a history of type 2 diabetes, hypertension, PVD, HLD, and history of kidney stones, presents to the ED at the advice of his primary provider.  Patient was evaluated with CT imaging today for intermittent flank pain for the last 3 weeks.  Patient reports intermittent fevers, nausea, vomiting, as well as some intermittent diarrhea.  Patient was advised after outpatient CT that he had in 11 mm at this UPJ.  Patient presented this time, noting pain at a 3 out of 10 overall.  He denies any diarrhea for the last 24 hours and no fevers in the last week.    Physical Exam   Triage Vital Signs: ED Triage Vitals  Enc Vitals Group     BP 03/30/22 1619 (!) 143/71     Pulse Rate 03/30/22 1619 84     Resp 03/30/22 1619 18     Temp 03/30/22 1619 98 F (36.7 C)     Temp Source 03/30/22 1619 Oral     SpO2 03/30/22 1619 98 %     Weight 03/30/22 1620 285 lb 3.2 oz (129.4 kg)     Height 03/30/22 1620 6\' 4"  (1.93 m)     Head Circumference --      Peak Flow --      Pain Score 03/30/22 1620 0     Pain Loc --      Pain Edu? --      Excl. in [place]? --     Most recent vital signs: Vitals:   03/30/22 1619  BP: (!) 143/71  Pulse: 84  Resp: 18  Temp: 98 F (36.7 C)  SpO2: 98%    General Awake, no distress. NAD CV:  Good peripheral perfusion.  RESP:  Normal effort.  ABD:  No distention. Soft, nontender. Normal bowel sounds noted x 4. No CVA tenderness elicited.   ED Results / Procedures / Treatments   Labs (all labs ordered are listed, but only abnormal results are displayed) Labs Reviewed  COMPREHENSIVE METABOLIC PANEL - Abnormal; Notable for the following components:      Result Value   Sodium 126 (*)    Chloride 91 (*)    Glucose, Bld 236 (*)     BUN 33 (*)    Creatinine, Ser 1.37 (*)    Calcium 8.2 (*)    Albumin 2.4 (*)    AST 69 (*)    ALT 55 (*)    GFR, Estimated 58 (*)    All other components within normal limits  CBC WITH DIFFERENTIAL/PLATELET - Abnormal; Notable for the following components:   Neutro Abs 8.0 (*)    Abs Immature Granulocytes 0.15 (*)    All other components within normal limits  LIPASE, BLOOD - Abnormal; Notable for the following components:   Lipase 60 (*)    All other components within normal limits  RESP PANEL BY RT-PCR (RSV, FLU A&B, COVID)  [id number]  URINE CULTURE  URINALYSIS, ROUTINE W REFLEX MICROSCOPIC     EKG    RADIOLOGY  I personally viewed and evaluated these images as part of my medical decision making, as well as reviewing the written report by the radiologist.  ED Provider Interpretation: 11 mm right UPJ stone  noted  CT RENAL STONE STUDY  Result Date: 03/30/2022 CLINICAL DATA:  Right lower quadrant abdominal pain. EXAM: CT ABDOMEN AND PELVIS WITHOUT CONTRAST TECHNIQUE: Multidetector CT imaging of the abdomen and pelvis was performed following the standard protocol without IV contrast. RADIATION DOSE REDUCTION: This exam was performed according to the departmental dose-optimization program which includes automated exposure control, adjustment of the mA and/or kV according to patient size and/or use of iterative reconstruction technique. COMPARISON:  None Available. FINDINGS: Lower chest: No acute abnormality. Hepatobiliary: No cholelithiasis is noted. Mild gallbladder distention is noted. Liver is grossly unremarkable on these unenhanced images. Pancreas: Unremarkable. No pancreatic ductal dilatation or surrounding inflammatory changes. Spleen: Normal in size without focal abnormality. Adrenals/Urinary Tract: Adrenal glands appear normal. Moderate right hydronephrosis is noted secondary to 11 mm calculus in the region of the right ureteropelvic junction. Urinary bladder is unremarkable.  Stomach/Bowel: Stomach is within normal limits. Appendix appears normal. No evidence of bowel wall thickening, distention, or inflammatory changes. Vascular/Lymphatic: Aortic atherosclerosis. No enlarged abdominal or pelvic lymph nodes. Reproductive: Prostate is unremarkable. Other: No abdominal wall hernia or abnormality. No abdominopelvic ascites. Musculoskeletal: No acute or significant osseous findings. IMPRESSION: Moderate right hydronephrosis is noted secondary to 11 mm calculus in the region of the right ureteropelvic junction. Mild gallbladder distention is noted without cholelithiasis or inflammatory changes. No biliary dilatation is noted. Aortic Atherosclerosis (ICD10-I70.0). Electronically Signed   By: Marijo Conception M.D.   On: 03/30/2022 09:48     PROCEDURES:  Critical Care performed: No  Procedures   MEDICATIONS ORDERED IN ED: Medications  cefTRIAXone (ROCEPHIN) 1 g in sodium chloride 0.9 % 100 mL IVPB (has no administration in time range)  sodium chloride 0.9 % bolus 1,000 mL (1,000 mLs Intravenous New Bag/Given 03/30/22 1813)     IMPRESSION / MDM / ASSESSMENT AND PLAN / ED COURSE  I reviewed the triage vital signs and the nursing notes.                              Differential diagnosis includes, but is not limited to, acute appendicitis, renal colic, testicular torsion, urinary tract infection/pyelonephritis, prostatitis,  epididymitis, diverticulitis, small bowel obstruction or ileus, colitis, abdominal aortic aneurysm, gastroenteritis, hernia, etc.   Patient's presentation is most consistent with acute presentation with potential threat to life or bodily function.  ----------------------------------------- 6:48 PM on 03/30/2022 ----------------------------------------- S/W Dr. Bernardo Heater on the case.  Reviewed the patient's CT scan and labs.  He would like patient be admitted to the hospital service due to his hyponatremia and AKI.  Since the patient is afebrile at this  time with pain well-controlled, he does not feel the patient needs an emergent stent placement at this time but he will consult patient in morning for ongoing management.  Patient's diagnosis is consistent with right UVP kidney stone, hyponatremia, and AKI.  Patient along with his wife are fully understanding and agreeable to the plan of inpatient management of the patient's kidney stone which is causing some mild hydronephrosis as well as AKI.  Patient also has some hyponatremia with some recent reports of nausea, vomiting, and diarrhea.  Patient is to be admitted to the hospital service for urology consult in the morning.     FINAL CLINICAL IMPRESSION(S) / ED DIAGNOSES   Final diagnoses:  Kidney stone  Hyponatremia  AKI (acute kidney injury) ([place])     Rx / DC Orders   ED Discharge Orders  None        Note:  This document was prepared using Dragon voice recognition software and may include unintentional dictation errors.    Melvenia Needles, PA-C 03/30/22 1920    Harvest Dark, MD 03/30/22 2018

## 2022-03-30 NOTE — Assessment & Plan Note (Addendum)
Patient with right flank and lower quadrant pain with associated urinary urgency and urinary frequency as well as some fevers.  There was some concern previously that he was passing a kidney stone and he has been referred to urology this week.  Given his symptoms we will get a CT scan renal stone protocol to evaluate for retained stone.  I feel this will provide enough information on his appendix as well as his kidneys.  We will get lab work as well to evaluate for other causes of his symptoms particularly in the setting of being on Mounjaro.  He will remain off of Mounjaro.  I encouraged adequate hydration.

## 2022-03-30 NOTE — Progress Notes (Signed)
[name] Rumps, MD Phone: [phone]  Joseph Singh is a 62 y.o. male who presents today for same day visit.   Fever/flank pain: Patient notes intermittent issues with fever over the last 3 weeks.  He notes vomiting initially and has intermittently had vomiting as well lasting 1 to 2 days at a time.  It is nonbloody nonbilious.  He had diarrhea 2 days ago that did not have any blood in it.  He has had fever up to 65 F though that was only on 1 occasion.  He has been intermittently having temperatures up to 100 F.  His wife thinks he has been urinating quite frequently.  He also has some urgency with urinary incontinence it is worse over the last 3 weeks.  He has had right low back pain that is been going on for years though recently has had pain radiating to his right lower quadrant from his back.  He notes no hematuria.  He did recently start on Mounjaro and the symptoms did start around the time that he started that.  His last dose of Mounjaro was 03/21/2022.  He has been drinking lots of water though his wife notes his urine is quite dark.  Dyspnea on exertion: Patient notes this has been going on for at least a few months.  His wife notes his breathing has not been right since he had COVID in 2021 or 2022.  He notes no chest pain, coughing, or wheezing associated with this.  Social History   Tobacco Use  Smoking Status Former   Packs/day: 0.50   Years: 35.00   Total pack years: 17.50   Types: Cigarettes   Quit date: 08/09/2018   Years since quitting: 3.6  Smokeless Tobacco Never    Current Outpatient Medications on File Prior to Visit  Medication Sig Dispense Refill   amLODipine (NORVASC) 10 MG tablet TAKE 1 TABLET BY MOUTH DAILY 90 tablet 3   aspirin EC 81 MG tablet Take 1 tablet (81 mg total) by mouth daily. 150 tablet 2   atorvastatin (LIPITOR) 80 MG tablet TAKE ONE TABLET BY MOUTH EVERY DAY 90 tablet 3   blood glucose meter kit and supplies KIT E11.51, check 2x daily,  dispense based on patient and insurance preference 1 each 0   clopidogrel (PLAVIX) 75 MG tablet TAKE ONE TABLET BY MOUTH EVERY DAY 90 tablet 3   gabapentin (NEURONTIN) 300 MG capsule TAKE 1 CAPSULE BY MOUTH FOUR TIMES DAILY 360 capsule 2   glipiZIDE (GLUCOTROL XL) 10 MG 24 hr tablet TAKE ONE TABLET BY MOUTH EACH DAY WITH BREAKFAST 90 tablet 3   glucose blood (ONE TOUCH ULTRA TEST) test strip Check blood sugar twice daily Dx code: E11.9 200 each 0   JARDIANCE 25 MG TABS tablet TAKE 1 TABLET BY MOUTH ONCE DAILY 90 tablet 3   lisinopril-hydrochlorothiazide (ZESTORETIC) 10-12.5 MG tablet TAKE 1 TABLET BY MOUTH DAILY 90 tablet 3   metFORMIN (GLUCOPHAGE) 1000 MG tablet TAKE ONE TABLET BY MOUTH TWICE DAILY WITH A MEAL 90 tablet 1   metoprolol succinate (TOPROL-XL) 50 MG 24 hr tablet TAKE ONE TABLET BY MOUTH ONCE DAILY WITHOR IMMEDIATELY FOLLOWING A MEAL 90 tablet 1   tirzepatide (MOUNJARO) 2.5 MG/0.5ML Pen Inject 2.5 mg into the skin once a week for 28 days. 2 mL 0   tirzepatide (MOUNJARO) 5 MG/0.5ML Pen Inject 5 mg into the skin once a week. Start after finishing the 28 days of the 2.5 mg  dose. 6 mL 2  traMADol (ULTRAM) 50 MG tablet TAKE 1 TABLET BY MOUTH EVERY 8 HOURS AS NEEDED. FOR MODERATE PAIN 90 tablet 0   No current facility-administered medications on file prior to visit.     ROS see history of present illness  Objective  Physical Exam Vitals:   03/30/22 0810  BP: 120/70  Pulse: 100  Temp: 98.8 F (37.1 C)  SpO2: 97%    BP Readings from Last 3 Encounters:  03/30/22 120/70  03/09/22 118/70  10/06/21 110/80   Wt Readings from Last 3 Encounters:  03/30/22 285 lb 3.2 oz (129.4 kg)  03/09/22 295 lb 3.2 oz (133.9 kg)  10/06/21 (!) 303 lb 9.6 oz (137.7 kg)    Physical Exam Constitutional:      General: He is not in acute distress.    Appearance: He is not diaphoretic.  Cardiovascular:     Rate and Rhythm: Normal rate and regular rhythm.     Heart sounds: Normal heart  sounds.  Pulmonary:     Effort: Pulmonary effort is normal.     Breath sounds: Normal breath sounds.  Abdominal:     General: Bowel sounds are normal. There is no distension.     Palpations: Abdomen is soft.     Tenderness: There is abdominal tenderness (Minimal right lower quadrant tenderness). There is no guarding or rebound.  Musculoskeletal:     Comments: No midline spine tenderness, no midline spine step-off, there is muscular upper lumbar tenderness from near the midline out to his right flank  Skin:    General: Skin is warm and dry.  Neurological:     Mental Status: He is alert.    EKG: Sinus rhythm, rate 87, T wave inversion lead III which is different from 2020 otherwise no other ST or T wave changes  Assessment/Plan: Please see individual problem list.  Right lower quadrant abdominal pain Assessment & Plan: Patient with right flank and lower quadrant pain with associated urinary urgency and urinary frequency as well as some fevers.  There was some concern previously that he was passing a kidney stone and he has been referred to urology this week.  Given his symptoms we will get a CT scan renal stone protocol to evaluate for retained stone.  I feel this will provide enough information on his appendix as well as his kidneys.  We will get lab work as well to evaluate for other causes of his symptoms particularly in the setting of being on Mounjaro.  He will remain off of Mounjaro.  I encouraged adequate hydration.  Orders: -     CT RENAL STONE STUDY; Future -     Comprehensive metabolic panel -     Lipase -     CBC with Differential/Platelet -     Urine Microscopic -     Urine Culture -     POCT urinalysis dipstick; Future  DOE (dyspnea on exertion) Assessment & Plan: This is a chronic ongoing issue.  EKG will be completed today.  Will get a chest x-ray as well to evaluate.  It is very possible he has some underlying chronic issue from having COVID.  Checking CBC to evaluate  for anemia as well.  Orders: -     DG Chest 2 View; Future -     EKG 12-Lead  Type 2 diabetes mellitus without complication, without long-term current use of insulin [name] Assessment & Plan: Chronic issue.  He will discontinue Mounjaro.  He will hold his glipizide until he has  better appetite and is eating more.  He can remain on Jardiance 25 mg daily and metformin 1000 mg twice daily.  If he does end up having some kind of urinary tract infection we may need to discontinue his Jardiance.     Return in about 3 weeks (around 04/20/2022) for Follow-up fever, vomiting, diarrhea at this.  I have spent 32 minutes in the care of this patient regarding history taking, documentation, completion of exam, discussion of plan, placing orders, this time does not include time spent interpreting the EKG.   [name] Rumps, MD [place]

## 2022-03-30 NOTE — Assessment & Plan Note (Signed)
Continue amlodipine and metoprolol.  Will hold home lisinopril HCTZ

## 2022-03-30 NOTE — ED Notes (Signed)
PA at bedside evaluating pt. Pt has 3/10 right upper and lower abdomen. Had CT scan, labs, urine today. 68mm stone to R side. Has had intermittent pain, fevers and urinary urgency for several weeks.

## 2022-03-30 NOTE — Assessment & Plan Note (Signed)
Chronic issue.  He will discontinue Mounjaro.  He will hold his glipizide until he has better appetite and is eating more.  He can remain on Jardiance 25 mg daily and metformin 1000 mg twice daily.  If he does end up having some kind of urinary tract infection we may need to discontinue his Jardiance.

## 2022-03-30 NOTE — Assessment & Plan Note (Signed)
Continue aspirin and atorvastatin. ?

## 2022-03-30 NOTE — ED Notes (Signed)
Mouse on computer in room stopped working. Unable to scan IV fluids.

## 2022-03-30 NOTE — Telephone Encounter (Signed)
Noted  

## 2022-03-30 NOTE — Plan of Care (Signed)

## 2022-03-30 NOTE — Assessment & Plan Note (Signed)
Hydronephrosis of right kidney Possible urinary tract infection UA not overwhelmingly convincing of UTI but given 3000 and elevated WBC will treat as UTI IV Rocephin Follow cultures IV hydration Urologist, Dr. Bernardo Heater was consulted from the ED and will see patient in the a.m.

## 2022-03-30 NOTE — Patient Instructions (Signed)
Nice to see you. We are going to get some lab work today and contact you with the results. We will contact you with your CT scan results. If you develop blood in your stool, blood in your vomit, significant abdominal pain, worsening fevers, or any new or worsening symptoms please be reevaluated. Please hold your glipizide until you are eating more food.

## 2022-03-30 NOTE — Assessment & Plan Note (Signed)
This is a chronic ongoing issue.  EKG will be completed today.  Will get a chest x-ray as well to evaluate.  It is very possible he has some underlying chronic issue from having COVID.  Checking CBC to evaluate for anemia as well.

## 2022-03-30 NOTE — ED Triage Notes (Signed)
Pt has been having right flank pain intermittently for the past few weeks, pt has been nauseated off and on. Diarrhea a couple times, pt has had blood work, urinalysis, and ct. Already performed today and pt was told that he had 11 cm stone.

## 2022-03-30 NOTE — Assessment & Plan Note (Signed)
Sliding scale insulin coverage Will hold off on Jardiance and glipizide

## 2022-03-30 NOTE — ED Notes (Signed)
RN to bedside to introduce self to pt. Pt is CAOx4 and getting [place] at this time.

## 2022-03-30 NOTE — Assessment & Plan Note (Signed)
Creatinine was 1.37 above baseline of 0.79 IV hydration Monitor renal function and avoid nephrotoxins

## 2022-03-31 DIAGNOSIS — Z833 Family history of diabetes mellitus: Secondary | ICD-10-CM | POA: Diagnosis not present

## 2022-03-31 DIAGNOSIS — N200 Calculus of kidney: Secondary | ICD-10-CM

## 2022-03-31 DIAGNOSIS — Z79899 Other long term (current) drug therapy: Secondary | ICD-10-CM | POA: Diagnosis not present

## 2022-03-31 DIAGNOSIS — E559 Vitamin D deficiency, unspecified: Secondary | ICD-10-CM | POA: Diagnosis present

## 2022-03-31 DIAGNOSIS — N390 Urinary tract infection, site not specified: Secondary | ICD-10-CM | POA: Diagnosis not present

## 2022-03-31 DIAGNOSIS — N201 Calculus of ureter: Secondary | ICD-10-CM | POA: Diagnosis not present

## 2022-03-31 DIAGNOSIS — E871 Hypo-osmolality and hyponatremia: Secondary | ICD-10-CM | POA: Diagnosis present

## 2022-03-31 DIAGNOSIS — K828 Other specified diseases of gallbladder: Secondary | ICD-10-CM | POA: Diagnosis present

## 2022-03-31 DIAGNOSIS — N179 Acute kidney failure, unspecified: Secondary | ICD-10-CM | POA: Diagnosis present

## 2022-03-31 DIAGNOSIS — N23 Unspecified renal colic: Secondary | ICD-10-CM | POA: Diagnosis not present

## 2022-03-31 DIAGNOSIS — Z87891 Personal history of nicotine dependence: Secondary | ICD-10-CM | POA: Diagnosis not present

## 2022-03-31 DIAGNOSIS — E785 Hyperlipidemia, unspecified: Secondary | ICD-10-CM | POA: Diagnosis present

## 2022-03-31 DIAGNOSIS — N202 Calculus of kidney with calculus of ureter: Secondary | ICD-10-CM | POA: Diagnosis present

## 2022-03-31 DIAGNOSIS — E1151 Type 2 diabetes mellitus with diabetic peripheral angiopathy without gangrene: Secondary | ICD-10-CM | POA: Diagnosis present

## 2022-03-31 DIAGNOSIS — N136 Pyonephrosis: Secondary | ICD-10-CM | POA: Diagnosis present

## 2022-03-31 DIAGNOSIS — D509 Iron deficiency anemia, unspecified: Secondary | ICD-10-CM | POA: Diagnosis present

## 2022-03-31 DIAGNOSIS — E669 Obesity, unspecified: Secondary | ICD-10-CM | POA: Diagnosis present

## 2022-03-31 DIAGNOSIS — B9561 Methicillin susceptible Staphylococcus aureus infection as the cause of diseases classified elsewhere: Secondary | ICD-10-CM | POA: Diagnosis present

## 2022-03-31 DIAGNOSIS — Z87442 Personal history of urinary calculi: Secondary | ICD-10-CM | POA: Diagnosis not present

## 2022-03-31 DIAGNOSIS — Z803 Family history of malignant neoplasm of breast: Secondary | ICD-10-CM | POA: Diagnosis not present

## 2022-03-31 DIAGNOSIS — E1142 Type 2 diabetes mellitus with diabetic polyneuropathy: Secondary | ICD-10-CM | POA: Diagnosis present

## 2022-03-31 DIAGNOSIS — Z801 Family history of malignant neoplasm of trachea, bronchus and lung: Secondary | ICD-10-CM | POA: Diagnosis not present

## 2022-03-31 DIAGNOSIS — E11610 Type 2 diabetes mellitus with diabetic neuropathic arthropathy: Secondary | ICD-10-CM | POA: Diagnosis present

## 2022-03-31 DIAGNOSIS — Z8249 Family history of ischemic heart disease and other diseases of the circulatory system: Secondary | ICD-10-CM | POA: Diagnosis not present

## 2022-03-31 DIAGNOSIS — Z1152 Encounter for screening for COVID-19: Secondary | ICD-10-CM | POA: Diagnosis not present

## 2022-03-31 DIAGNOSIS — I10 Essential (primary) hypertension: Secondary | ICD-10-CM | POA: Diagnosis present

## 2022-03-31 DIAGNOSIS — E1165 Type 2 diabetes mellitus with hyperglycemia: Secondary | ICD-10-CM | POA: Diagnosis present

## 2022-03-31 DIAGNOSIS — K921 Melena: Secondary | ICD-10-CM | POA: Diagnosis not present

## 2022-03-31 LAB — GLUCOSE, CAPILLARY
Glucose-Capillary: 127 mg/dL — ABNORMAL HIGH (ref 70–99)
Glucose-Capillary: 153 mg/dL — ABNORMAL HIGH (ref 70–99)
Glucose-Capillary: 158 mg/dL — ABNORMAL HIGH (ref 70–99)
Glucose-Capillary: 202 mg/dL — ABNORMAL HIGH (ref 70–99)
Glucose-Capillary: 204 mg/dL — ABNORMAL HIGH (ref 70–99)
Glucose-Capillary: 233 mg/dL — ABNORMAL HIGH (ref 70–99)
Glucose-Capillary: 85 mg/dL (ref 70–99)

## 2022-03-31 LAB — COMPREHENSIVE METABOLIC PANEL
ALT: 71 U/L — ABNORMAL HIGH (ref 0–44)
AST: 87 U/L — ABNORMAL HIGH (ref 15–41)
Albumin: 2.2 g/dL — ABNORMAL LOW (ref 3.5–5.0)
Alkaline Phosphatase: 105 U/L (ref 38–126)
Anion gap: 12 (ref 5–15)
BUN: 31 mg/dL — ABNORMAL HIGH (ref 8–23)
CO2: 22 mmol/L (ref 22–32)
Calcium: 8.2 mg/dL — ABNORMAL LOW (ref 8.9–10.3)
Chloride: 95 mmol/L — ABNORMAL LOW (ref 98–111)
Creatinine, Ser: 1.14 mg/dL (ref 0.61–1.24)
GFR, Estimated: >60 mL/min (ref >60)
Glucose, Bld: 93 mg/dL (ref 70–99)
Potassium: 3.5 mmol/L (ref 3.5–5.1)
Sodium: 129 mmol/L — ABNORMAL LOW (ref 135–145)
Total Bilirubin: 0.6 mg/dL (ref 0.3–1.2)
Total Protein: 6.9 g/dL (ref 6.5–8.1)

## 2022-03-31 LAB — CBC
HCT: 37.9 % — ABNORMAL LOW (ref 39.0–52.0)
Hemoglobin: 12.2 g/dL — ABNORMAL LOW (ref 13.0–17.0)
MCH: 29.5 pg (ref 26.0–34.0)
MCHC: 32.2 g/dL (ref 30.0–36.0)
MCV: 91.5 fL (ref 80.0–100.0)
Platelets: 303 10*3/uL (ref 150–400)
RBC: 4.14 MIL/uL — ABNORMAL LOW (ref 4.22–5.81)
RDW: 13.6 % (ref 11.5–15.5)
WBC: 9.1 10*3/uL (ref 4.0–10.5)
nRBC: 0 % (ref 0.0–0.2)

## 2022-03-31 LAB — MAGNESIUM: Magnesium: 2.1 mg/dL (ref 1.7–2.4)

## 2022-03-31 LAB — HIV ANTIBODY (ROUTINE TESTING W REFLEX): HIV Screen 4th Generation wRfx: NONREACTIVE

## 2022-03-31 MED ORDER — POTASSIUM CHLORIDE CRYS ER 20 MEQ PO TBCR
40.0000 meq | EXTENDED_RELEASE_TABLET | Freq: Two times a day (BID) | ORAL | Status: AC
  Administered 2022-03-31 (×2): 40 meq via ORAL
  Filled 2022-03-31 (×2): qty 2

## 2022-03-31 NOTE — Progress Notes (Signed)
PROGRESS NOTE  Joseph Singh    DOB: 10/14/1960, 62 y.o.  BTD:[id number]    Code Status: Full Code   DOA: 03/30/2022   LOS: 0   Brief hospital course  Joseph Singh is a 62 y.o. male with a PMH significant for DM, peripheral neuropathy with left Charcot foot, HTN, nephrolithiasis.  They presented from home to the ED on 03/30/2022 with nausea vomiting and diarrhea with fever and flank pain worsening x 3 weeks. Reports fever of 102 at home. He notes that the symptoms started around initiation of Mounjaro which was subsequently stopped on 03/21/2022.   He had a workup outpatient by PCP which included: WBC of 12,000, glucose 227, sodium 126, lipase 149.  LFTs normal.  Urinalysis showed positive glucose and protein but negative nitrite, rare bacteria, greater than 50 WBCs per hpf. Outpatient CT renal stone study showed the following: IMPRESSION: "Moderate right hydronephrosis is noted secondary to 11 mm calculus n the region of the right ureteropelvic junction.Mild gallbladder distention is noted without cholelithiasis or inflammatory changes. No biliary dilatation is noted."  PCP recommended presentation to ED.   In the ED, it was found that they had Vitals within normal limits.  Significant findings included: CBC had normalized.  Lipase was 60 down from 159.  CMP was significant for elevated transaminases with AST 69 and ALT 55 which were normal earlier in the day.  Creatinine was 1.37 (baseline 0.79) glucose 236.  Sodium 126.  They were initially treated with IV fluids and CTX. Urology was consulted for management   Patient was admitted to medicine service for further workup and management of obstructive renal stone as outlined in detail below.  03/31/22 -stable with chronic back pain.   Assessment & Plan  Principal Problem:   Complicated UTI (urinary tract infection) Active Problems:   Hydronephrosis of right kidney   Obstruction of right ureteropelvic junction (UPJ) due to stone    Vomiting and diarrhea   Abnormal LFTs   Elevated lipase   Uncontrolled type 2 diabetes mellitus with hyperglycemia, without long-term current use of insulin [name]   AKI (acute kidney injury) ([place])   Essential hypertension   PVD (peripheral vascular disease) [name]  Obstruction of right ureteropelvic junction (UPJ) due to stone Hydronephrosis of right kidney Possible urinary tract infection - continue IV Rocephin - Follow cultures - IV hydration - urology consulted, appreciate recs  - NPO at midnight for planned uretoscopy with lithotripsy 2/9 - analgesia PRN   Vomiting and diarrhea Abnormal LFTs, elevated lipase CT abdomen and pelvis showed distended gallbladder without cholelithiasis or inflammatory changes and no biliary dilatation. RUQ [place] negative.  - antiemetics PRN - advance diet as tolerated   AKI- resolved. Creatinine was 1.37 above baseline of 0.79 at admission >1.14 - IV hydration - Monitor renal function and avoid nephrotoxins  Hyponatremia- due to fluid shift. Improving. Na+ 126>>129 - continue IV fluids - frequent neuro checks  Blood in stool- patient's spouse reports scant amount of blood in BM last night. No repeat appearance in subsequent Bms. Patient denies abdominal pain. No straining. No h/o colonoscopy.  - monitor hgb - monitor stool for repeat - recommend following up with routine colonoscopy screening outpatient unless has repeat significant bleed and can consider urgent colonoscopy.    Uncontrolled type 2 diabetes mellitus with hyperglycemia, without long-term current use of insulin [name] - Sliding scale insulin coverage Will hold off on Jardiance and glipizide   PVD (peripheral vascular disease) [name] Continue aspirin and  atorvastatin   Essential hypertension- well controlled Continue amlodipine and metoprolol.  Will hold home lisinopril HCTZ  Body mass index is 34.72 kg/m.  VTE ppx: SCDs Start: 03/30/22 1953   Diet:     Diet   Diet NPO  time specified Except for: Sips with Meds   Consultants: Urology   Subjective 03/31/22    Pt reports feeling overall OK. Continues to have lower back/flank pain but states this is consistent with his chronic low back pain and not significantly increased from the baseline. Denies Nausea or abdominal pain currently.    Objective   Vitals:   03/30/22 1900 03/30/22 2000 03/30/22 2155 03/31/22 0422  BP: 130/62 122/65 (!) 113/59 (!) 112/57  Pulse: 74 70 75 (!) 105  Resp: 16 16 18 20   Temp: 98 F (36.7 C) 98.2 F (36.8 C) 99 F (37.2 C) 97.9 F (36.6 C)  TempSrc:  Oral Oral Oral  SpO2: 98% 98% 96% 98%  Weight:      Height:        Intake/Output Summary (Last 24 hours) at 03/31/2022 7711 Last data filed at 03/31/2022 0400 Gross per 24 hour  Intake 1772.99 ml  Output 800 ml  Net 972.99 ml   Filed Weights   03/30/22 1620  Weight: 129.4 kg     Physical Exam:  General: awake, alert, NAD, obese HEENT: atraumatic, clear conjunctiva, anicteric sclera, MMM, hearing grossly normal Respiratory: normal respiratory effort. Cardiovascular: quick capillary refill Gastrointestinal: soft, NT, ND Nervous: A&O x3. no gross focal neurologic deficits, normal speech Extremities: moves all equally, no edema, normal tone Skin: dry, intact, normal temperature, normal color. No rashes, lesions or ulcers on exposed skin Psychiatry: normal mood, congruent affect  Labs   I have personally reviewed the following labs and imaging studies CBC    Component Value Date/Time   WBC 9.1 03/31/2022 0405   RBC 4.14 (L) 03/31/2022 0405   HGB 12.2 (L) 03/31/2022 0405   HCT 37.9 (L) 03/31/2022 0405   PLT 303 03/31/2022 0405   MCV 91.5 03/31/2022 0405   MCH 29.5 03/31/2022 0405   MCHC 32.2 03/31/2022 0405   RDW 13.6 03/31/2022 0405   LYMPHSABS 1.1 03/30/2022 1754   MONOABS 1.0 03/30/2022 1754   EOSABS 0.1 03/30/2022 1754   BASOSABS 0.0 03/30/2022 1754      Latest Ref Rng & Units 03/31/2022    4:05 AM  03/30/2022    5:54 PM 03/30/2022    9:04 AM  BMP  Glucose 70 - 99 mg/dL 93  236  227   BUN 8 - 23 mg/dL 31  33  31   Creatinine 0.61 - 1.24 mg/dL 1.14  1.37  1.25   Sodium 135 - 145 mmol/L 129  126  126   Potassium 3.5 - 5.1 mmol/L 3.5  4.0  4.5   Chloride 98 - 111 mmol/L 95  91  88   CO2 22 - 32 mmol/L 22  24  24    Calcium 8.9 - 10.3 mg/dL 8.2  8.2  8.3     DG Chest 2 View  Result Date: 03/30/2022 CLINICAL DATA:  Dyspnea on exertion for a number of months. History of COVID EXAM: CHEST - 2 VIEW COMPARISON:  None Available. FINDINGS: Heart size and pulmonary vascularity are normal. Mild interstitial changes demonstrated in the lung bases suggesting some scarring or fibrosis. No airspace disease or consolidation. No pleural effusions. No pneumothorax. Mediastinal contours appear intact. Degenerative changes in the spine. IMPRESSION: Interstitial changes  in the lung bases may represent scarring or fibrosis. No focal consolidation. Electronically Signed   By: Lucienne Capers M.D.   On: 03/30/2022 20:08   US Abdomen Limited RUQ (LIVER/GB)  Result Date: 03/30/2022 CLINICAL DATA:  Elevated liver function studies. EXAM: ULTRASOUND ABDOMEN LIMITED RIGHT UPPER QUADRANT COMPARISON:  CT 03/30/2022 FINDINGS: Gallbladder: No gallstones or wall thickening visualized. No sonographic Murphy sign noted by sonographer. Common bile duct: Diameter: 2 mm, normal Liver: No focal lesion identified. Within normal limits in parenchymal echogenicity. Portal vein is patent on color Doppler imaging with normal direction of blood flow towards the liver. Other: Incidental note of right renal hydronephrosis with a shadowing stone in the right renal pelvis measuring about 1.4 cm diameter. This is also demonstrated at previous CT. IMPRESSION: 1. No evidence of cholelithiasis or acute cholecystitis. 2. Right renal hydronephrosis with a stone in the right renal pelvis. Electronically Signed   By: Lucienne Capers M.D.   On: 03/30/2022  20:07   CT RENAL STONE STUDY  Result Date: 03/30/2022 CLINICAL DATA:  Right lower quadrant abdominal pain. EXAM: CT ABDOMEN AND PELVIS WITHOUT CONTRAST TECHNIQUE: Multidetector CT imaging of the abdomen and pelvis was performed following the standard protocol without IV contrast. RADIATION DOSE REDUCTION: This exam was performed according to the departmental dose-optimization program which includes automated exposure control, adjustment of the mA and/or kV according to patient size and/or use of iterative reconstruction technique. COMPARISON:  None Available. FINDINGS: Lower chest: No acute abnormality. Hepatobiliary: No cholelithiasis is noted. Mild gallbladder distention is noted. Liver is grossly unremarkable on these unenhanced images. Pancreas: Unremarkable. No pancreatic ductal dilatation or surrounding inflammatory changes. Spleen: Normal in size without focal abnormality. Adrenals/Urinary Tract: Adrenal glands appear normal. Moderate right hydronephrosis is noted secondary to 11 mm calculus in the region of the right ureteropelvic junction. Urinary bladder is unremarkable. Stomach/Bowel: Stomach is within normal limits. Appendix appears normal. No evidence of bowel wall thickening, distention, or inflammatory changes. Vascular/Lymphatic: Aortic atherosclerosis. No enlarged abdominal or pelvic lymph nodes. Reproductive: Prostate is unremarkable. Other: No abdominal wall hernia or abnormality. No abdominopelvic ascites. Musculoskeletal: No acute or significant osseous findings. IMPRESSION: Moderate right hydronephrosis is noted secondary to 11 mm calculus in the region of the right ureteropelvic junction. Mild gallbladder distention is noted without cholelithiasis or inflammatory changes. No biliary dilatation is noted. Aortic Atherosclerosis (ICD10-I70.0). Electronically Signed   By: Marijo Conception M.D.   On: 03/30/2022 09:48    Disposition Plan & Communication  Patient status: Observation  Admitted  From: Home Planned disposition location: Home Anticipated discharge date: 2/9 pending urology procedure  Family Communication: spouse at bedside    Author: Richarda Osmond, DO [facility] 03/31/2022, 7:28 AM   Available by Epic secure chat 7AM-7PM. If 7PM-7AM, please contact night-coverage.  [facility] contact information found on [email or web address].

## 2022-03-31 NOTE — TOC Initial Note (Signed)
Transition of Care [facility]) - Initial/Assessment Note    Patient Details  Name: Joseph Singh MRN: [id number] Date of Birth: [birth year]  Transition of Care [facility]) CM/SW Contact:    Beverly Sessions, RN Phone Number: 03/31/2022, 2:28 PM  Clinical Narrative:                  Transition of Care [facility]) Screening Note   Patient Details  Name: Joseph Singh Date of Birth: 1960-04-06   Transition of Care [facility]) CM/SW Contact:    Beverly Sessions, RN Phone Number: 03/31/2022, 2:28 PM    Transition of Care Department [facility]) has reviewed patient and no TOC needs have been identified at this time. We will continue to monitor patient advancement through interdisciplinary progression rounds. If new patient transition needs arise, please place a TOC consult.          Patient Goals and CMS Choice            Expected Discharge Plan and Services                                              Prior Living Arrangements/Services                       Activities of Daily Living Home Assistive Devices/Equipment: None ADL Screening (condition at time of admission) Patient's cognitive ability adequate to safely complete daily activities?: Yes Is the patient deaf or have difficulty hearing?: No Does the patient have difficulty seeing, even when wearing glasses/contacts?: No Does the patient have difficulty concentrating, remembering, or making decisions?: No Patient able to express need for assistance with ADLs?: Yes Does the patient have difficulty dressing or bathing?: No Independently performs ADLs?: Yes (appropriate for developmental age) Does the patient have difficulty walking or climbing stairs?: Yes Weakness of Legs: Both Weakness of Arms/Hands: None  Permission Sought/Granted                  Emotional Assessment              Admission diagnosis:  Kidney stone [N20.0] Hyponatremia [E87.1] AKI (acute kidney injury) ([place])  [H41.7] Complicated UTI (urinary tract infection) [N39.0] Renal stones [N20.0] Patient Active Problem List   Diagnosis Date Noted   Hyponatremia 03/31/2022   Renal stones 03/31/2022   Right flank and lower quadrant pain 03/30/2022   DOE (dyspnea on exertion) 40/81/4481   Complicated UTI (urinary tract infection) 03/30/2022   Hydronephrosis of right kidney 03/30/2022   Vomiting and diarrhea 03/30/2022   Abnormal LFTs 03/30/2022   Elevated lipase 03/30/2022   Obstruction of right ureteropelvic junction (UPJ) due to stone 03/30/2022   AKI (acute kidney injury) ([place]) 03/30/2022   Kidney stone 03/10/2022   Bilateral finger numbness 06/30/2021   Diabetic foot infection ([place]) 01/21/2021   Seborrheic keratosis 09/03/2019   Partial nontraumatic amputation of foot, 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   Charcot ankle, left 02/01/2019   Hypogonadism in male 02/23/2018   History of tobacco abuse 02/05/2016   Hyperlipidemia 02/02/2016   Essential hypertension 10/31/2015   PVD (peripheral vascular disease) ([place]) 10/31/2015   Diabetic neuropathy ([place]) 10/31/2015   Uncontrolled type 2 diabetes mellitus with hyperglycemia, without long-term current use of insulin ([place]) 08/18/2014   Back pain 85/63/1497   Metabolic syndrome 02/63/7858   H/O blood clots 06/12/2014   PCP:  Leone Haven, MD Pharmacy:   [place], [place] - [address] [place] [address] Phone: [phone] Fax: [phone]     Social Determinants of Health (SDOH) Social History: SDOH Screenings   Food Insecurity: No Food Insecurity (03/31/2022)  Housing: Low Risk  (03/31/2022)  Transportation Needs: No Transportation Needs (03/31/2022)  Utilities: Not At Risk (03/31/2022)  Depression (PHQ2-9): Low Risk  (03/09/2022)  Financial Resource Strain: Low Risk  (06/22/2021)  Physical Activity: Insufficiently Active (06/22/2021)  Social Connections: Unknown (06/22/2021)  Stress: No Stress Concern Present  (06/22/2021)  Tobacco Use: Medium Risk (03/30/2022)   SDOH Interventions: Housing Interventions: Intervention Not Indicated   Readmission Risk Interventions     No data to display

## 2022-03-31 NOTE — Progress Notes (Signed)
Inpatient Diabetes Program Recommendations  AACE/ADA: New Consensus Statement on Inpatient Glycemic Control (2015)  Target Ranges:  Prepandial:   less than 140 mg/dL      Peak postprandial:   less than 180 mg/dL (1-2 hours)      Critically ill patients:  140 - 180 mg/dL   Lab Results  Component Value Date   GLUCAP 127 (H) 03/31/2022   HGBA1C 8.3 (A) 03/09/2022    Review of Glycemic Control  Latest Reference Range & Units 03/30/22 22:23 03/31/22 00:31 03/31/22 04:35 03/31/22 07:43  Glucose-Capillary 70 - 99 mg/dL 198 (H) 202 (H) 85 127 (H)   Diabetes history: DM  Outpatient Diabetes medications:  Glucotrol XL - one tablet daily Jardiance 25 mg daily Metformin 1000 mg bid Mounjaro 5 mg weekly  Current orders for Inpatient glycemic control:  Novolog 0-15 units q 4 hours  Inpatient Diabetes Program Recommendations:    Agree with current orders.  Will follow.   Thanks,  Adah Perl, RN, BC-ADM Inpatient Diabetes Coordinator Pager [phone]  (8a-5p)

## 2022-03-31 NOTE — Consult Note (Addendum)
Urology Consult  I have been asked to see the patient by Dr. Damita Dunnings, for evaluation and management of right UPJ stone.  Chief Complaint: Right flank pain, nausea, vomiting, diarrhea  History of Present Illness: Joseph Singh is a 62 y.o. year old male with PMH DM2 and remote nephrolithiasis who presented to the ED yesterday after abnormal CT and lab findings per his PCP.  CT stone study revealed an 11 mm right UPJ stone with moderate right hydronephrosis.  No perinephric stranding noted.  WBC count stable today, 9.1.  Creatinine down, 1.14.  Admission UA was rather bland with 6-10 WBCs/hpf, no nitrites, no hematuria, and no bacteria.  Urine culture is pending.  He has been started on antibiotics as below.  He is afebrile, VSS this morning.  Primary team is also working up abnormal LFTs and elevated lipase.  He is accompanied today by his wife at the bedside.  He reports his last kidney stone was >30 years ago.  He reports a 3-week history of intermittent right flank pain, nausea, vomiting, and diarrhea and intermittent fevers.  This morning his pain is well-controlled.  Anti-infectives (From admission, onward)    Start     Dose/Rate Route Frequency Ordered Stop   03/31/22 2000  cefTRIAXone (ROCEPHIN) 1 g in sodium chloride 0.9 % 100 mL IVPB        1 g 200 mL/hr over 30 Minutes Intravenous Every 24 hours 03/30/22 1951     03/30/22 1900  cefTRIAXone (ROCEPHIN) 1 g in sodium chloride 0.9 % 100 mL IVPB        1 g 200 mL/hr over 30 Minutes Intravenous  Once 03/30/22 1855 03/30/22 2030       Past Medical History:  Diagnosis Date   Arthritis    Diabetes mellitus without complication ([place])    History of kidney stones    h/o   Hyperlipidemia    Hypertension    Neuropathy     Past Surgical History:  Procedure Laterality Date   AMPUTATION TOE Left 08/10/2018   Procedure: RAY LEFT;  Surgeon: Samara Deist, DPM;  Location: [facility];  Service: Podiatry;  Laterality: Left;    ARTHRODESIS METATARSAL Left 02/01/2019   Procedure: ARTHRODESIS METATARSAL;  Surgeon: Samara Deist, DPM;  Location: [facility];  Service: Podiatry;  Laterality: Left;   FOOT ARTHRODESIS Left 02/01/2019   Procedure: ARTHRODESIS FOOT;STJ;  Surgeon: Samara Deist, DPM;  Location: [facility];  Service: Podiatry;  Laterality: Left;   IRRIGATION AND DEBRIDEMENT FOOT Left 10/26/2018   Procedure: IRRIGATION AND DEBRIDEMENT FOOT, LEFT;  Surgeon: Samara Deist, DPM;  Location: [facility];  Service: Podiatry;  Laterality: Left;   LOWER EXTREMITY ANGIOGRAPHY Right 02/25/2016   Procedure: Lower Extremity Angiography;  Surgeon: Algernon Huxley, MD;  Location: [place];  Service: Cardiovascular;  Laterality: Right;   LOWER EXTREMITY ANGIOGRAPHY Right 02/27/2017   Procedure: LOWER EXTREMITY ANGIOGRAPHY;  Surgeon: Algernon Huxley, MD;  Location: [place];  Service: Cardiovascular;  Laterality: Right;   LOWER EXTREMITY ANGIOGRAPHY Left 08/06/2018   Procedure: LOWER EXTREMITY ANGIOGRAPHY;  Surgeon: Algernon Huxley, MD;  Location: [facility];  Service: Cardiovascular;  Laterality: Left;   LOWER EXTREMITY INTERVENTION  02/25/2016   Procedure: Lower Extremity Intervention;  Surgeon: Algernon Huxley, MD;  Location: [place] [facility];  Service: Cardiovascular;;   TOE AMPUTATION      Home Medications:  No outpatient medications have been marked as taking for the 03/30/22 encounter (  Hospital Encounter).    Allergies: No Known Allergies  Family History  Problem Relation Age of Onset   Diabetes Mother    Heart disease Father    Diabetes Sister    Diabetes Brother    Cancer Sister        lung   Cancer Sister        breast    Social History:  reports that he quit smoking about 3 years ago. His smoking use included cigarettes. He has a 17.50 pack-year smoking history. He has never used smokeless tobacco. He reports that he does not drink alcohol and does not use drugs.  ROS: A complete review of  systems was performed.  All systems are negative except for pertinent findings as noted.  Physical Exam:  Vital signs in last 24 hours: Temp:  [97.9 F (36.6 C)-99 F (37.2 C)] 98 F (36.7 C) (02/08 0833) Pulse Rate:  [70-105] 71 (02/08 0833) Resp:  [16-20] 18 (02/08 0833) BP: (112-143)/(57-71) 118/61 (02/08 0833) SpO2:  [96 %-99 %] 99 % (02/08 0833) Weight:  [129.4 kg] 129.4 kg (02/07 1620) Constitutional:  Alert and oriented, no acute distress HEENT: [place] AT, moist mucus membranes Cardiovascular: No clubbing, cyanosis, or edema Respiratory: Normal respiratory effort Skin: No rashes, bruises or suspicious lesions Neurologic: Grossly intact, no focal deficits, moving all 4 extremities Psychiatric: Normal mood and affect  Laboratory Data:  Recent Labs    03/30/22 0904 03/30/22 1754 03/31/22 0405  WBC 12.7* 10.3 9.1  HGB 13.3 13.0 12.2*  HCT 39.1 39.7 37.9*   Recent Labs    03/30/22 0904 03/30/22 1754 03/31/22 0405  NA 126* 126* 129*  K 4.5 4.0 3.5  CL 88* 91* 95*  CO2 24 24 22  $ GLUCOSE 227* 236* 93  BUN 31* 33* 31*  CREATININE 1.25 1.37* 1.14  CALCIUM 8.3* 8.2* 8.2*   Urinalysis    Component Value Date/Time   COLORURINE YELLOW (A) 03/30/2022 1755   APPEARANCEUR CLEAR (A) 03/30/2022 1755   LABSPEC 1.023 03/30/2022 1755   PHURINE 5.0 03/30/2022 1755   GLUCOSEU >=500 (A) 03/30/2022 1755   HGBUR SMALL (A) 03/30/2022 1755   BILIRUBINUR NEGATIVE 03/30/2022 1755   BILIRUBINUR negative 03/30/2022 0914   KETONESUR NEGATIVE 03/30/2022 1755   PROTEINUR NEGATIVE 03/30/2022 1755   UROBILINOGEN 1.0 03/30/2022 0914   NITRITE NEGATIVE 03/30/2022 1755   LEUKOCYTESUR SMALL (A) 03/30/2022 1755   Results for orders placed or performed during the hospital encounter of 03/30/22  Resp panel by RT-PCR (RSV, Flu A&B, Covid) Anterior Nasal Swab     Status: None   Collection Time: 03/30/22  6:26 PM   Specimen: Anterior Nasal Swab  Result Value Ref Range Status   SARS Coronavirus  2 by RT PCR NEGATIVE NEGATIVE Final    Comment: (NOTE) SARS-CoV-2 target nucleic acids are NOT DETECTED.  The SARS-CoV-2 RNA is generally detectable in upper respiratory specimens during the acute phase of infection. The lowest concentration of SARS-CoV-2 viral copies this assay can detect is 138 copies/mL. A negative result does not preclude SARS-Cov-2 infection and should not be used as the sole basis for treatment or other patient management decisions. A negative result may occur with  improper specimen collection/handling, submission of specimen other than nasopharyngeal swab, presence of viral mutation(s) within the areas targeted by this assay, and inadequate number of viral copies(<138 copies/mL). A negative result must be combined with clinical observations, patient history, and epidemiological information. The expected result is Negative.  Fact Sheet for  Patients:  EntrepreneurPulse.com.au  Fact Sheet for Healthcare Providers:  IncredibleEmployment.be  This test is no t yet approved or cleared by the [place] FDA and  has been authorized for detection and/or diagnosis of SARS-CoV-2 by FDA under an Emergency Use Authorization (EUA). This EUA will remain  in effect (meaning this test can be used) for the duration of the COVID-19 declaration under Section 564(b)(1) of the Act, 21 U.S.C.section 360bbb-3(b)(1), unless the authorization is terminated  or revoked sooner.       Influenza A by PCR NEGATIVE NEGATIVE Final   Influenza B by PCR NEGATIVE NEGATIVE Final    Comment: (NOTE) The Xpert Xpress SARS-CoV-2/FLU/RSV plus assay is intended as an aid in the diagnosis of influenza from Nasopharyngeal swab specimens and should not be used as a sole basis for treatment. Nasal washings and aspirates are unacceptable for Xpert Xpress SARS-CoV-2/FLU/RSV testing.  Fact Sheet for Patients: EntrepreneurPulse.com.au  Fact  Sheet for Healthcare Providers: IncredibleEmployment.be  This test is not yet approved or cleared by the [place] FDA and has been authorized for detection and/or diagnosis of SARS-CoV-2 by FDA under an Emergency Use Authorization (EUA). This EUA will remain in effect (meaning this test can be used) for the duration of the COVID-19 declaration under Section 564(b)(1) of the Act, 21 U.S.C. section 360bbb-3(b)(1), unless the authorization is terminated or revoked.     Resp Syncytial Virus by PCR NEGATIVE NEGATIVE Final    Comment: (NOTE) Fact Sheet for Patients: EntrepreneurPulse.com.au  Fact Sheet for Healthcare Providers: IncredibleEmployment.be  This test is not yet approved or cleared by the [place] FDA and has been authorized for detection and/or diagnosis of SARS-CoV-2 by FDA under an Emergency Use Authorization (EUA). This EUA will remain in effect (meaning this test can be used) for the duration of the COVID-19 declaration under Section 564(b)(1) of the Act, 21 U.S.C. section 360bbb-3(b)(1), unless the authorization is terminated or revoked.  Performed at [facility], [address] [place], [place] [address]     Radiologic Imaging: DG Chest 2 View  Result Date: 03/30/2022 CLINICAL DATA:  Dyspnea on exertion for a number of months. History of COVID EXAM: CHEST - 2 VIEW COMPARISON:  None Available. FINDINGS: Heart size and pulmonary vascularity are normal. Mild interstitial changes demonstrated in the lung bases suggesting some scarring or fibrosis. No airspace disease or consolidation. No pleural effusions. No pneumothorax. Mediastinal contours appear intact. Degenerative changes in the spine. IMPRESSION: Interstitial changes in the lung bases may represent scarring or fibrosis. No focal consolidation. Electronically Signed   By: Lucienne Capers M.D.   On: 03/30/2022 20:08   US Abdomen Limited RUQ  (LIVER/GB)  Result Date: 03/30/2022 CLINICAL DATA:  Elevated liver function studies. EXAM: ULTRASOUND ABDOMEN LIMITED RIGHT UPPER QUADRANT COMPARISON:  CT 03/30/2022 FINDINGS: Gallbladder: No gallstones or wall thickening visualized. No sonographic Murphy sign noted by sonographer. Common bile duct: Diameter: 2 mm, normal Liver: No focal lesion identified. Within normal limits in parenchymal echogenicity. Portal vein is patent on color Doppler imaging with normal direction of blood flow towards the liver. Other: Incidental note of right renal hydronephrosis with a shadowing stone in the right renal pelvis measuring about 1.4 cm diameter. This is also demonstrated at previous CT. IMPRESSION: 1. No evidence of cholelithiasis or acute cholecystitis. 2. Right renal hydronephrosis with a stone in the right renal pelvis. Electronically Signed   By: Lucienne Capers M.D.   On: 03/30/2022 20:07   CT RENAL STONE STUDY  Result Date: 03/30/2022 CLINICAL DATA:  Right lower quadrant abdominal pain. EXAM: CT ABDOMEN AND PELVIS WITHOUT CONTRAST TECHNIQUE: Multidetector CT imaging of the abdomen and pelvis was performed following the standard protocol without IV contrast. RADIATION DOSE REDUCTION: This exam was performed according to the departmental dose-optimization program which includes automated exposure control, adjustment of the mA and/or kV according to patient size and/or use of iterative reconstruction technique. COMPARISON:  None Available. FINDINGS: Lower chest: No acute abnormality. Hepatobiliary: No cholelithiasis is noted. Mild gallbladder distention is noted. Liver is grossly unremarkable on these unenhanced images. Pancreas: Unremarkable. No pancreatic ductal dilatation or surrounding inflammatory changes. Spleen: Normal in size without focal abnormality. Adrenals/Urinary Tract: Adrenal glands appear normal. Moderate right hydronephrosis is noted secondary to 11 mm calculus in the region of the right  ureteropelvic junction. Urinary bladder is unremarkable. Stomach/Bowel: Stomach is within normal limits. Appendix appears normal. No evidence of bowel wall thickening, distention, or inflammatory changes. Vascular/Lymphatic: Aortic atherosclerosis. No enlarged abdominal or pelvic lymph nodes. Reproductive: Prostate is unremarkable. Other: No abdominal wall hernia or abnormality. No abdominopelvic ascites. Musculoskeletal: No acute or significant osseous findings. IMPRESSION: Moderate right hydronephrosis is noted secondary to 11 mm calculus in the region of the right ureteropelvic junction. Mild gallbladder distention is noted without cholelithiasis or inflammatory changes. No biliary dilatation is noted. Aortic Atherosclerosis (ICD10-I70.0). Electronically Signed   By: Marijo Conception M.D.   On: 03/30/2022 09:48    Assessment & Plan:  62 year old diabetic male admitted with right renal colic secondary to an 11 mm right UPJ stone.  WBC count has normalized since admission, he is afebrile this morning, and admission UA is rather bland.  Low concern for urinary infection at this time, will defer to primary team on continued antibiotics.  We discussed today that it is unlikely he will be able to spontaneously pass a stone of the size.  Instead, I offered him right ureteroscopy with laser lithotripsy and stent placement with Dr. Diamantina Providence tomorrow.  We discussed that his ureteral stent will be temporary to allow for healing but may cause flank pain, bladder pain, dysuria, urgency, frequency, and gross hematuria for its duration.  He expressed understanding and would like to proceed.  Okay for diet today from the urologic perspective, please make him n.p.o. at midnight tonight in advance of right ureteroscopy tomorrow.  Recommendations: -Antibiotics per primary team -Pain control and supportive care -N.p.o. at midnight tonight -Right ureteroscopy with laser lithotripsy and stent placement with Dr. Diamantina Providence  tomorrow  Thank you for involving me in this patient's care, I will continue to follow along.  Debroah Loop, PA-C 03/31/2022 8:59 AM

## 2022-03-31 NOTE — Plan of Care (Signed)
  Problem: Education: Goal: Knowledge of General Education information will improve Description: Including pain rating scale, medication(s)/side effects and non-pharmacologic comfort measures 03/31/2022 0530 by Mordecai Rasmussen, RN Outcome: Progressing 03/30/2022 2329 by Mordecai Rasmussen, RN Outcome: Progressing   Problem: Health Behavior/Discharge Planning: Goal: Ability to manage health-related needs will improve 03/31/2022 0530 by Mordecai Rasmussen, RN Outcome: Progressing 03/30/2022 2329 by Mordecai Rasmussen, RN Outcome: Progressing   Problem: Clinical Measurements: Goal: Ability to maintain clinical measurements within normal limits will improve 03/31/2022 0530 by Mordecai Rasmussen, RN Outcome: Progressing 03/30/2022 2329 by Mordecai Rasmussen, RN Outcome: Progressing Goal: Will remain free from infection 03/31/2022 0530 by Mordecai Rasmussen, RN Outcome: Progressing 03/30/2022 2329 by Mordecai Rasmussen, RN Outcome: Progressing Goal: Diagnostic test results will improve Outcome: Progressing Goal: Respiratory complications will improve Outcome: Progressing Goal: Cardiovascular complication will be avoided Outcome: Progressing   Problem: Activity: Goal: Risk for activity intolerance will decrease Outcome: Progressing   Problem: Nutrition: Goal: Adequate nutrition will be maintained Outcome: Progressing   Problem: Coping: Goal: Level of anxiety will decrease Outcome: Progressing   Problem: Elimination: Goal: Will not experience complications related to bowel motility Outcome: Progressing Goal: Will not experience complications related to urinary retention Outcome: Progressing   Problem: Pain Managment: Goal: General experience of comfort will improve Outcome: Progressing   Problem: Safety: Goal: Ability to remain free from injury will improve Outcome: Progressing   Problem: Skin Integrity: Goal: Risk for impaired skin integrity will decrease Outcome: Progressing

## 2022-03-31 NOTE — Progress Notes (Signed)
   03/30/22 2326  Provider Notification  Provider Name/Title Judd Gaudier MD  Date Provider Notified 03/30/22  Time Provider Notified 2326  Method of Notification  (Secure chat)  Notification Reason Other (Comment) (diarrhea, noted frank red blood in stool, minimal/scant amount. Pt asking if OK to get antidiarrheal.)  Provider response See new orders (GI panel ordered.)  Date of Provider Response 03/30/22  Time of Provider Response 2330

## 2022-03-31 NOTE — H&P (View-Only) (Signed)
Urology Consult  I have been asked to see the patient by Dr. Damita Dunnings, for evaluation and management of right UPJ stone.  Chief Complaint: Right flank pain, nausea, vomiting, diarrhea  History of Present Illness: Joseph Singh is a 62 y.o. year old male with PMH DM2 and remote nephrolithiasis who presented to the ED yesterday after abnormal CT and lab findings per his PCP.  CT stone study revealed an 11 mm right UPJ stone with moderate right hydronephrosis.  No perinephric stranding noted.  WBC count stable today, 9.1.  Creatinine down, 1.14.  Admission UA was rather bland with 6-10 WBCs/hpf, no nitrites, no hematuria, and no bacteria.  Urine culture is pending.  He has been started on antibiotics as below.  He is afebrile, VSS this morning.  Primary team is also working up abnormal LFTs and elevated lipase.  He is accompanied today by his wife at the bedside.  He reports his last kidney stone was >30 years ago.  He reports a 3-week history of intermittent right flank pain, nausea, vomiting, and diarrhea and intermittent fevers.  This morning his pain is well-controlled.  Anti-infectives (From admission, onward)    Start     Dose/Rate Route Frequency Ordered Stop   03/31/22 2000  cefTRIAXone (ROCEPHIN) 1 g in sodium chloride 0.9 % 100 mL IVPB        1 g 200 mL/hr over 30 Minutes Intravenous Every 24 hours 03/30/22 1951     03/30/22 1900  cefTRIAXone (ROCEPHIN) 1 g in sodium chloride 0.9 % 100 mL IVPB        1 g 200 mL/hr over 30 Minutes Intravenous  Once 03/30/22 1855 03/30/22 2030       Past Medical History:  Diagnosis Date   Arthritis    Diabetes mellitus without complication ([place])    History of kidney stones    h/o   Hyperlipidemia    Hypertension    Neuropathy     Past Surgical History:  Procedure Laterality Date   AMPUTATION TOE Left 08/10/2018   Procedure: RAY LEFT;  Surgeon: Samara Deist, DPM;  Location: [facility];  Service: Podiatry;  Laterality: Left;    ARTHRODESIS METATARSAL Left 02/01/2019   Procedure: ARTHRODESIS METATARSAL;  Surgeon: Samara Deist, DPM;  Location: [facility];  Service: Podiatry;  Laterality: Left;   FOOT ARTHRODESIS Left 02/01/2019   Procedure: ARTHRODESIS FOOT;STJ;  Surgeon: Samara Deist, DPM;  Location: [facility];  Service: Podiatry;  Laterality: Left;   IRRIGATION AND DEBRIDEMENT FOOT Left 10/26/2018   Procedure: IRRIGATION AND DEBRIDEMENT FOOT, LEFT;  Surgeon: Samara Deist, DPM;  Location: [facility];  Service: Podiatry;  Laterality: Left;   LOWER EXTREMITY ANGIOGRAPHY Right 02/25/2016   Procedure: Lower Extremity Angiography;  Surgeon: Algernon Huxley, MD;  Location: [place];  Service: Cardiovascular;  Laterality: Right;   LOWER EXTREMITY ANGIOGRAPHY Right 02/27/2017   Procedure: LOWER EXTREMITY ANGIOGRAPHY;  Surgeon: Algernon Huxley, MD;  Location: [place];  Service: Cardiovascular;  Laterality: Right;   LOWER EXTREMITY ANGIOGRAPHY Left 08/06/2018   Procedure: LOWER EXTREMITY ANGIOGRAPHY;  Surgeon: Algernon Huxley, MD;  Location: [facility];  Service: Cardiovascular;  Laterality: Left;   LOWER EXTREMITY INTERVENTION  02/25/2016   Procedure: Lower Extremity Intervention;  Surgeon: Algernon Huxley, MD;  Location: [place] [facility];  Service: Cardiovascular;;   TOE AMPUTATION      Home Medications:  No outpatient medications have been marked as taking for the 03/30/22 encounter (  Hospital Encounter).    Allergies: No Known Allergies  Family History  Problem Relation Age of Onset   Diabetes Mother    Heart disease Father    Diabetes Sister    Diabetes Brother    Cancer Sister        lung   Cancer Sister        breast    Social History:  reports that he quit smoking about 3 years ago. His smoking use included cigarettes. He has a 17.50 pack-year smoking history. He has never used smokeless tobacco. He reports that he does not drink alcohol and does not use drugs.  ROS: A complete review of  systems was performed.  All systems are negative except for pertinent findings as noted.  Physical Exam:  Vital signs in last 24 hours: Temp:  [97.9 F (36.6 C)-99 F (37.2 C)] 98 F (36.7 C) (02/08 0833) Pulse Rate:  [70-105] 71 (02/08 0833) Resp:  [16-20] 18 (02/08 0833) BP: (112-143)/(57-71) 118/61 (02/08 0833) SpO2:  [96 %-99 %] 99 % (02/08 0833) Weight:  [129.4 kg] 129.4 kg (02/07 1620) Constitutional:  Alert and oriented, no acute distress HEENT: [place] AT, moist mucus membranes Cardiovascular: No clubbing, cyanosis, or edema Respiratory: Normal respiratory effort Skin: No rashes, bruises or suspicious lesions Neurologic: Grossly intact, no focal deficits, moving all 4 extremities Psychiatric: Normal mood and affect  Laboratory Data:  Recent Labs    03/30/22 0904 03/30/22 1754 03/31/22 0405  WBC 12.7* 10.3 9.1  HGB 13.3 13.0 12.2*  HCT 39.1 39.7 37.9*   Recent Labs    03/30/22 0904 03/30/22 1754 03/31/22 0405  NA 126* 126* 129*  K 4.5 4.0 3.5  CL 88* 91* 95*  CO2 24 24 22  $ GLUCOSE 227* 236* 93  BUN 31* 33* 31*  CREATININE 1.25 1.37* 1.14  CALCIUM 8.3* 8.2* 8.2*   Urinalysis    Component Value Date/Time   COLORURINE YELLOW (A) 03/30/2022 1755   APPEARANCEUR CLEAR (A) 03/30/2022 1755   LABSPEC 1.023 03/30/2022 1755   PHURINE 5.0 03/30/2022 1755   GLUCOSEU >=500 (A) 03/30/2022 1755   HGBUR SMALL (A) 03/30/2022 1755   BILIRUBINUR NEGATIVE 03/30/2022 1755   BILIRUBINUR negative 03/30/2022 0914   KETONESUR NEGATIVE 03/30/2022 1755   PROTEINUR NEGATIVE 03/30/2022 1755   UROBILINOGEN 1.0 03/30/2022 0914   NITRITE NEGATIVE 03/30/2022 1755   LEUKOCYTESUR SMALL (A) 03/30/2022 1755   Results for orders placed or performed during the hospital encounter of 03/30/22  Resp panel by RT-PCR (RSV, Flu A&B, Covid) Anterior Nasal Swab     Status: None   Collection Time: 03/30/22  6:26 PM   Specimen: Anterior Nasal Swab  Result Value Ref Range Status   SARS Coronavirus  2 by RT PCR NEGATIVE NEGATIVE Final    Comment: (NOTE) SARS-CoV-2 target nucleic acids are NOT DETECTED.  The SARS-CoV-2 RNA is generally detectable in upper respiratory specimens during the acute phase of infection. The lowest concentration of SARS-CoV-2 viral copies this assay can detect is 138 copies/mL. A negative result does not preclude SARS-Cov-2 infection and should not be used as the sole basis for treatment or other patient management decisions. A negative result may occur with  improper specimen collection/handling, submission of specimen other than nasopharyngeal swab, presence of viral mutation(s) within the areas targeted by this assay, and inadequate number of viral copies(<138 copies/mL). A negative result must be combined with clinical observations, patient history, and epidemiological information. The expected result is Negative.  Fact Sheet for  Patients:  EntrepreneurPulse.com.au  Fact Sheet for Healthcare Providers:  IncredibleEmployment.be  This test is no t yet approved or cleared by the [place] FDA and  has been authorized for detection and/or diagnosis of SARS-CoV-2 by FDA under an Emergency Use Authorization (EUA). This EUA will remain  in effect (meaning this test can be used) for the duration of the COVID-19 declaration under Section 564(b)(1) of the Act, 21 U.S.C.section 360bbb-3(b)(1), unless the authorization is terminated  or revoked sooner.       Influenza A by PCR NEGATIVE NEGATIVE Final   Influenza B by PCR NEGATIVE NEGATIVE Final    Comment: (NOTE) The Xpert Xpress SARS-CoV-2/FLU/RSV plus assay is intended as an aid in the diagnosis of influenza from Nasopharyngeal swab specimens and should not be used as a sole basis for treatment. Nasal washings and aspirates are unacceptable for Xpert Xpress SARS-CoV-2/FLU/RSV testing.  Fact Sheet for Patients: EntrepreneurPulse.com.au  Fact  Sheet for Healthcare Providers: IncredibleEmployment.be  This test is not yet approved or cleared by the [place] FDA and has been authorized for detection and/or diagnosis of SARS-CoV-2 by FDA under an Emergency Use Authorization (EUA). This EUA will remain in effect (meaning this test can be used) for the duration of the COVID-19 declaration under Section 564(b)(1) of the Act, 21 U.S.C. section 360bbb-3(b)(1), unless the authorization is terminated or revoked.     Resp Syncytial Virus by PCR NEGATIVE NEGATIVE Final    Comment: (NOTE) Fact Sheet for Patients: EntrepreneurPulse.com.au  Fact Sheet for Healthcare Providers: IncredibleEmployment.be  This test is not yet approved or cleared by the [place] FDA and has been authorized for detection and/or diagnosis of SARS-CoV-2 by FDA under an Emergency Use Authorization (EUA). This EUA will remain in effect (meaning this test can be used) for the duration of the COVID-19 declaration under Section 564(b)(1) of the Act, 21 U.S.C. section 360bbb-3(b)(1), unless the authorization is terminated or revoked.  Performed at [facility], [address] [place], [place] [address]     Radiologic Imaging: DG Chest 2 View  Result Date: 03/30/2022 CLINICAL DATA:  Dyspnea on exertion for a number of months. History of COVID EXAM: CHEST - 2 VIEW COMPARISON:  None Available. FINDINGS: Heart size and pulmonary vascularity are normal. Mild interstitial changes demonstrated in the lung bases suggesting some scarring or fibrosis. No airspace disease or consolidation. No pleural effusions. No pneumothorax. Mediastinal contours appear intact. Degenerative changes in the spine. IMPRESSION: Interstitial changes in the lung bases may represent scarring or fibrosis. No focal consolidation. Electronically Signed   By: Lucienne Capers M.D.   On: 03/30/2022 20:08   US Abdomen Limited RUQ  (LIVER/GB)  Result Date: 03/30/2022 CLINICAL DATA:  Elevated liver function studies. EXAM: ULTRASOUND ABDOMEN LIMITED RIGHT UPPER QUADRANT COMPARISON:  CT 03/30/2022 FINDINGS: Gallbladder: No gallstones or wall thickening visualized. No sonographic Murphy sign noted by sonographer. Common bile duct: Diameter: 2 mm, normal Liver: No focal lesion identified. Within normal limits in parenchymal echogenicity. Portal vein is patent on color Doppler imaging with normal direction of blood flow towards the liver. Other: Incidental note of right renal hydronephrosis with a shadowing stone in the right renal pelvis measuring about 1.4 cm diameter. This is also demonstrated at previous CT. IMPRESSION: 1. No evidence of cholelithiasis or acute cholecystitis. 2. Right renal hydronephrosis with a stone in the right renal pelvis. Electronically Signed   By: Lucienne Capers M.D.   On: 03/30/2022 20:07   CT RENAL STONE STUDY  Result Date: 03/30/2022 CLINICAL DATA:  Right lower quadrant abdominal pain. EXAM: CT ABDOMEN AND PELVIS WITHOUT CONTRAST TECHNIQUE: Multidetector CT imaging of the abdomen and pelvis was performed following the standard protocol without IV contrast. RADIATION DOSE REDUCTION: This exam was performed according to the departmental dose-optimization program which includes automated exposure control, adjustment of the mA and/or kV according to patient size and/or use of iterative reconstruction technique. COMPARISON:  None Available. FINDINGS: Lower chest: No acute abnormality. Hepatobiliary: No cholelithiasis is noted. Mild gallbladder distention is noted. Liver is grossly unremarkable on these unenhanced images. Pancreas: Unremarkable. No pancreatic ductal dilatation or surrounding inflammatory changes. Spleen: Normal in size without focal abnormality. Adrenals/Urinary Tract: Adrenal glands appear normal. Moderate right hydronephrosis is noted secondary to 11 mm calculus in the region of the right  ureteropelvic junction. Urinary bladder is unremarkable. Stomach/Bowel: Stomach is within normal limits. Appendix appears normal. No evidence of bowel wall thickening, distention, or inflammatory changes. Vascular/Lymphatic: Aortic atherosclerosis. No enlarged abdominal or pelvic lymph nodes. Reproductive: Prostate is unremarkable. Other: No abdominal wall hernia or abnormality. No abdominopelvic ascites. Musculoskeletal: No acute or significant osseous findings. IMPRESSION: Moderate right hydronephrosis is noted secondary to 11 mm calculus in the region of the right ureteropelvic junction. Mild gallbladder distention is noted without cholelithiasis or inflammatory changes. No biliary dilatation is noted. Aortic Atherosclerosis (ICD10-I70.0). Electronically Signed   By: Marijo Conception M.D.   On: 03/30/2022 09:48    Assessment & Plan:  62 year old diabetic male admitted with right renal colic secondary to an 11 mm right UPJ stone.  WBC count has normalized since admission, he is afebrile this morning, and admission UA is rather bland.  Low concern for urinary infection at this time, will defer to primary team on continued antibiotics.  We discussed today that it is unlikely he will be able to spontaneously pass a stone of the size.  Instead, I offered him right ureteroscopy with laser lithotripsy and stent placement with Dr. Diamantina Providence tomorrow.  We discussed that his ureteral stent will be temporary to allow for healing but may cause flank pain, bladder pain, dysuria, urgency, frequency, and gross hematuria for its duration.  He expressed understanding and would like to proceed.  Okay for diet today from the urologic perspective, please make him n.p.o. at midnight tonight in advance of right ureteroscopy tomorrow.  Recommendations: -Antibiotics per primary team -Pain control and supportive care -N.p.o. at midnight tonight -Right ureteroscopy with laser lithotripsy and stent placement with Dr. Diamantina Providence  tomorrow  Thank you for involving me in this patient's care, I will continue to follow along.  Debroah Loop, PA-C 03/31/2022 8:59 AM

## 2022-04-01 ENCOUNTER — Inpatient Hospital Stay: Payer: 59 | Admitting: Registered Nurse

## 2022-04-01 ENCOUNTER — Inpatient Hospital Stay: Payer: 59

## 2022-04-01 ENCOUNTER — Other Ambulatory Visit: Payer: Self-pay | Admitting: Urology

## 2022-04-01 ENCOUNTER — Encounter
Admission: EM | Disposition: A | Discharge status: Home / Self Care | Payer: Self-pay | Source: Home / Self Care | Attending: Student

## 2022-04-01 ENCOUNTER — Encounter: Payer: Self-pay | Admitting: Student in an Organized Health Care Education/Training Program

## 2022-04-01 DIAGNOSIS — N201 Calculus of ureter: Secondary | ICD-10-CM | POA: Diagnosis not present

## 2022-04-01 DIAGNOSIS — N200 Calculus of kidney: Secondary | ICD-10-CM

## 2022-04-01 HISTORY — PX: CYSTOSCOPY/URETEROSCOPY/HOLMIUM LASER/STENT PLACEMENT: SHX6546

## 2022-04-01 LAB — COMPREHENSIVE METABOLIC PANEL
ALT: 56 U/L — ABNORMAL HIGH (ref 0–44)
AST: 51 U/L — ABNORMAL HIGH (ref 15–41)
Albumin: 2 g/dL — ABNORMAL LOW (ref 3.5–5.0)
Alkaline Phosphatase: 104 U/L (ref 38–126)
Anion gap: 7 (ref 5–15)
BUN: 21 mg/dL (ref 8–23)
CO2: 21 mmol/L — ABNORMAL LOW (ref 22–32)
Calcium: 7.5 mg/dL — ABNORMAL LOW (ref 8.9–10.3)
Chloride: 102 mmol/L (ref 98–111)
Creatinine, Ser: 1.15 mg/dL (ref 0.61–1.24)
GFR, Estimated: >60 mL/min (ref >60)
Glucose, Bld: 128 mg/dL — ABNORMAL HIGH (ref 70–99)
Potassium: 4.2 mmol/L (ref 3.5–5.1)
Sodium: 130 mmol/L — ABNORMAL LOW (ref 135–145)
Total Bilirubin: 0.6 mg/dL (ref 0.3–1.2)
Total Protein: 6.5 g/dL (ref 6.5–8.1)

## 2022-04-01 LAB — URINALYSIS, ROUTINE W REFLEX MICROSCOPIC
RBC / HPF: >50 RBC/hpf (ref 0–5)
Specific Gravity, Urine: 1.013 (ref 1.005–1.030)
WBC, UA: >50 WBC/hpf (ref 0–5)

## 2022-04-01 LAB — CBC
HCT: 36.6 % — ABNORMAL LOW (ref 39.0–52.0)
Hemoglobin: 12 g/dL — ABNORMAL LOW (ref 13.0–17.0)
MCH: 29.6 pg (ref 26.0–34.0)
MCHC: 32.8 g/dL (ref 30.0–36.0)
MCV: 90.1 fL (ref 80.0–100.0)
Platelets: 289 10*3/uL (ref 150–400)
RBC: 4.06 MIL/uL — ABNORMAL LOW (ref 4.22–5.81)
RDW: 13.6 % (ref 11.5–15.5)
WBC: 13.9 10*3/uL — ABNORMAL HIGH (ref 4.0–10.5)
nRBC: 0 % (ref 0.0–0.2)

## 2022-04-01 LAB — GLUCOSE, CAPILLARY
Glucose-Capillary: 157 mg/dL — ABNORMAL HIGH (ref 70–99)
Glucose-Capillary: 143 mg/dL — ABNORMAL HIGH (ref 70–99)
Glucose-Capillary: 142 mg/dL — ABNORMAL HIGH (ref 70–99)
Glucose-Capillary: 146 mg/dL — ABNORMAL HIGH (ref 70–99)
Glucose-Capillary: 187 mg/dL — ABNORMAL HIGH (ref 70–99)
Glucose-Capillary: 299 mg/dL — ABNORMAL HIGH (ref 70–99)
Glucose-Capillary: 269 mg/dL — ABNORMAL HIGH (ref 70–99)

## 2022-04-01 LAB — IRON AND TIBC
Iron: 20 ug/dL — ABNORMAL LOW (ref 45–182)
Saturation Ratios: 14 % — ABNORMAL LOW (ref 17.9–39.5)
TIBC: 143 ug/dL — ABNORMAL LOW (ref 250–450)
UIBC: 123 ug/dL

## 2022-04-01 LAB — VITAMIN B12: Vitamin B-12: 392 pg/mL (ref 180–914)

## 2022-04-01 LAB — FOLATE: Folate: 21.5 ng/mL (ref >5.9)

## 2022-04-01 LAB — OSMOLALITY: Osmolality: 278 mOsm/kg (ref 275–295)

## 2022-04-01 SURGERY — CYSTOSCOPY/URETEROSCOPY/HOLMIUM LASER/STENT PLACEMENT
Anesthesia: General | Site: Ureter | Laterality: Right

## 2022-04-01 MED ORDER — FENTANYL CITRATE (PF) 100 MCG/2ML IJ SOLN
INTRAMUSCULAR | Status: AC
  Filled 2022-04-01: qty 2

## 2022-04-01 MED ORDER — ALBUTEROL SULFATE HFA 108 (90 BASE) MCG/ACT IN AERS
INHALATION_SPRAY | RESPIRATORY_TRACT | Status: DC | PRN
  Administered 2022-04-01: 6 via RESPIRATORY_TRACT

## 2022-04-01 MED ORDER — IOHEXOL 180 MG/ML  SOLN
INTRAMUSCULAR | Status: DC | PRN
  Administered 2022-04-01: 10 mL

## 2022-04-01 MED ORDER — ONDANSETRON HCL 4 MG/2ML IJ SOLN
INTRAMUSCULAR | Status: AC
  Filled 2022-04-01: qty 2

## 2022-04-01 MED ORDER — DEXAMETHASONE SODIUM PHOSPHATE 10 MG/ML IJ SOLN
INTRAMUSCULAR | Status: DC | PRN
  Administered 2022-04-01: 8 mg via INTRAVENOUS

## 2022-04-01 MED ORDER — SODIUM CHLORIDE 0.9 % IR SOLN
Status: DC | PRN
  Administered 2022-04-01: 3000 mL via INTRAVESICAL

## 2022-04-01 MED ORDER — MIDAZOLAM HCL 2 MG/2ML IJ SOLN
INTRAMUSCULAR | Status: DC | PRN
  Administered 2022-04-01: 2 mg via INTRAVENOUS

## 2022-04-01 MED ORDER — MIDAZOLAM HCL 2 MG/2ML IJ SOLN
INTRAMUSCULAR | Status: AC
  Filled 2022-04-01: qty 2

## 2022-04-01 MED ORDER — ROCURONIUM BROMIDE 100 MG/10ML IV SOLN
INTRAVENOUS | Status: DC | PRN
  Administered 2022-04-01: 10 mg via INTRAVENOUS
  Administered 2022-04-01: 60 mg via INTRAVENOUS

## 2022-04-01 MED ORDER — ONDANSETRON HCL 4 MG/2ML IJ SOLN: 4.0000 mg | Freq: Once | INTRAMUSCULAR | Status: DC | PRN

## 2022-04-01 MED ORDER — SUGAMMADEX SODIUM 200 MG/2ML IV SOLN
INTRAVENOUS | Status: DC | PRN
  Administered 2022-04-01: 258.8 mg via INTRAVENOUS

## 2022-04-01 MED ORDER — FENTANYL CITRATE (PF) 100 MCG/2ML IJ SOLN: 25.0000 ug | INTRAMUSCULAR | Status: DC | PRN

## 2022-04-01 MED ORDER — ROCURONIUM BROMIDE 10 MG/ML (PF) SYRINGE
PREFILLED_SYRINGE | INTRAVENOUS | Status: AC
  Filled 2022-04-01: qty 10

## 2022-04-01 MED ORDER — VANCOMYCIN HCL 2000 MG/400ML IV SOLN
2000.0000 mg | Freq: Once | INTRAVENOUS | Status: AC
  Administered 2022-04-01: 2000 mg via INTRAVENOUS
  Filled 2022-04-01: qty 400

## 2022-04-01 MED ORDER — ONDANSETRON HCL 4 MG/2ML IJ SOLN
INTRAMUSCULAR | Status: DC | PRN
  Administered 2022-04-01: 4 mg via INTRAVENOUS

## 2022-04-01 MED ORDER — FENTANYL CITRATE (PF) 100 MCG/2ML IJ SOLN
INTRAMUSCULAR | Status: DC | PRN
  Administered 2022-04-01: 50 ug via INTRAVENOUS
  Administered 2022-04-01 (×2): 25 ug via INTRAVENOUS

## 2022-04-01 MED ORDER — DEXAMETHASONE SODIUM PHOSPHATE 10 MG/ML IJ SOLN
INTRAMUSCULAR | Status: AC
  Filled 2022-04-01: qty 1

## 2022-04-01 MED ORDER — PROPOFOL 10 MG/ML IV BOLUS
INTRAVENOUS | Status: AC
  Filled 2022-04-01: qty 40

## 2022-04-01 MED ORDER — PROPOFOL 10 MG/ML IV BOLUS
INTRAVENOUS | Status: DC | PRN
  Administered 2022-04-01: 200 mg via INTRAVENOUS

## 2022-04-01 MED ORDER — LIDOCAINE HCL (PF) 2 % IJ SOLN
INTRAMUSCULAR | Status: AC
  Filled 2022-04-01: qty 5

## 2022-04-01 MED ORDER — LIDOCAINE HCL (CARDIAC) PF 100 MG/5ML IV SOSY
PREFILLED_SYRINGE | INTRAVENOUS | Status: DC | PRN
  Administered 2022-04-01: 100 mg via INTRAVENOUS

## 2022-04-01 MED ORDER — VANCOMYCIN HCL 1500 MG/300ML IV SOLN
1500.0000 mg | Freq: Two times a day (BID) | INTRAVENOUS | Status: DC
  Administered 2022-04-02: 1500 mg via INTRAVENOUS
  Filled 2022-04-01 (×2): qty 300

## 2022-04-01 MED ORDER — SODIUM CHLORIDE 0.9 % IV SOLN
2.0000 g | INTRAVENOUS | Status: DC
  Administered 2022-04-01: 2 g via INTRAVENOUS
  Filled 2022-04-01: qty 20
  Filled 2022-04-01: qty 2

## 2022-04-01 SURGICAL SUPPLY — 30 items
ADHESIVE MASTISOL STRL (MISCELLANEOUS) IMPLANT
BAG DRAIN SIEMENS DORNER NS (MISCELLANEOUS) ×1 IMPLANT
BAG PRESSURE INF REUSE 3000 (BAG) ×1 IMPLANT
BRUSH SCRUB EZ 1% IODOPHOR (MISCELLANEOUS) ×1 IMPLANT
CATH URET FLEX-TIP 2 LUMEN 10F (CATHETERS) IMPLANT
CATH URETL OPEN 5X70 (CATHETERS) IMPLANT
CNTNR URN SCR LID CUP LEK RST (MISCELLANEOUS) IMPLANT
CONT SPEC 4OZ STRL OR WHT (MISCELLANEOUS)
DRAPE UTILITY 15X26 TOWEL STRL (DRAPES) ×1 IMPLANT
DRSG TEGADERM 2-3/8X2-3/4 SM (GAUZE/BANDAGES/DRESSINGS) IMPLANT
FIBER LASER MOSES 200 DFL (Laser) IMPLANT
GLOVE SURG UNDER POLY LF SZ7.5 (GLOVE) ×1 IMPLANT
GOWN STRL REUS W/ TWL LRG LVL3 (GOWN DISPOSABLE) ×1 IMPLANT
GOWN STRL REUS W/ TWL XL LVL3 (GOWN DISPOSABLE) ×1 IMPLANT
GOWN STRL REUS W/TWL LRG LVL3 (GOWN DISPOSABLE) ×1
GOWN STRL REUS W/TWL XL LVL3 (GOWN DISPOSABLE) ×1
GUIDEWIRE STR DUAL SENSOR (WIRE) ×1 IMPLANT
GUIDEWIRE STR ZIPWIRE 035X150 (MISCELLANEOUS) IMPLANT
IV NS IRRIG 3000ML ARTHROMATIC (IV SOLUTION) ×1 IMPLANT
KIT TURNOVER CYSTO (KITS) ×1 IMPLANT
PACK CYSTO AR (MISCELLANEOUS) ×1 IMPLANT
SET CYSTO W/LG BORE CLAMP LF (SET/KITS/TRAYS/PACK) ×1 IMPLANT
SHEATH NAVIGATOR HD 12/14X36 (SHEATH) IMPLANT
STENT URET 6FRX24 CONTOUR (STENTS) IMPLANT
STENT URET 6FRX26 CONTOUR (STENTS) IMPLANT
STENT URET 6FRX30 CONTOUR (STENTS) IMPLANT
SURGILUBE 2OZ TUBE FLIPTOP (MISCELLANEOUS) ×1 IMPLANT
SYR 10ML LL (SYRINGE) ×1 IMPLANT
VALVE UROSEAL ADJ ENDO (VALVE) IMPLANT
WATER STERILE IRR 500ML POUR (IV SOLUTION) ×1 IMPLANT

## 2022-04-01 NOTE — Op Note (Signed)
Date of procedure: 04/01/22  Preoperative diagnosis:  Right ureteral stone  Postoperative diagnosis:  Same  Procedure: Cystoscopy, right ureteroscopy, right retrograde pyelogram with intraoperative interpretation, right ureteral stent placement  Surgeon: Nickolas Madrid, MD  Anesthesia: General  Complications: None  Intraoperative findings:  Frankly purulent urine in right kidney after ureteroscope advanced into the kidney, stent placed  EBL: Minimal  Specimens: Urine for culture  Drains: Right 6 French by 30 cm ureteral stent  Indication: Joseph Singh is a 62 y.o. patient with 1 cm right proximal ureteral stone and poorly controlled pain.  He had no laboratory or clinical evidence of UTI or sepsis and opted for ureteroscopy.  After reviewing the management options for treatment, they elected to proceed with the above surgical procedure(s). We have discussed the potential benefits and risks of the procedure, side effects of the proposed treatment, the likelihood of the patient achieving the goals of the procedure, and any potential problems that might occur during the procedure or recuperation. Informed consent has been obtained.  Description of procedure:  The patient was taken to the operating room and general anesthesia was induced. SCDs were placed for DVT prophylaxis. The patient was placed in the dorsal lithotomy position, prepped and draped in the usual sterile fashion, and preoperative antibiotics(ceftriaxone on the floor) were administered. A preoperative time-out was performed.   A 21 French rigid cystoscope was used to intubate the urethra and a normal-appearing urethra was followed proximally into the bladder.  The prostate was small to moderate in size.  Thorough cystoscopy showed no suspicious lesions, and the ureteral orifices were orthotopic bilaterally.  A sensor wire was advanced into the right ureteral orifice and met resistance at the level of the stone in the  proximal ureter.  A 5 French access catheter was advanced up to the level of the stone but I was still unable to advance the sensor wire up into the kidney.  A retrograde pyelogram was performed from below the stone showing minimal contrast passing alongside the stone into a moderately dilated collecting system.  Ultimately, I was able to advance a Glidewire alongside the stone and up into the kidney.  A dual-lumen ureteral access catheter was then advanced over the Glidewire, and a sensor wire was able to be advanced up into the kidney.  The stone appeared to push back into the renal pelvis.  The digital single-channel flexible ureteroscope advanced easily over the wire up to the kidney.  Vision was extremely poor and I aspirated fluid through the ureteroscope and the urine was frankly purulent.  At this point I opted for stent placement with plan for delayed ureteroscopy in 2 to 3 weeks after antibiotics.  The rigid cystoscope was backloaded over the wire and a 6 [place] by 30 cm ureteral stent was placed with a curl in the renal pelvis, as well as in the bladder.  Under direct vision, there was purulent drainage through the sideports of the stent.  A 18 French Foley was placed to maximize drainage.  Disposition: Stable to PACU  Plan: Foley can be removed tomorrow morning if doing well clinically and afebrile Recommend discharge with 2 weeks of antibiotics, follow-up culture results and sensitivities Will coordinate outpatient right ureteroscopy, laser lithotripsy, stent change in 2 to 3 weeks  Nickolas Madrid, MD

## 2022-04-01 NOTE — Progress Notes (Addendum)
Pharmacy Antibiotic Note  Joseph Singh is a 62 y.o. male admitted on 03/30/2022 with UTI.  Pharmacy has been consulted for vancomycin dosing. Urine culture (clean catch) with S aureus.  UA not consistent with UTI but patient with obstructing ureteral stone s/p cystoscopy with stenting today.  Urology noted frank purulence when aspirated urine from ureteroscope.    Plan: Vancomycin 2gm IV x 1 then 1533m IV q12h  Goal AUC 400-600 Estimated AUC = 451 with SCr 1.15 and Vd = 0.72L/kg Continue ceftriaxone (increase dose to 2gm q24h for obesity) Monitor renal function and cultures, susceptibilties  Height: 6' 4"$  (193 cm) Weight: 129.4 kg (285 lb 4.4 oz) IBW/kg (Calculated) : 86.8  Temp (24hrs), Avg:97.9 F (36.6 C), Min:96.3 F (35.7 C), Max:99.4 F (37.4 C)  Recent Labs  Lab 03/30/22 0904 03/30/22 1754 03/31/22 0405 04/01/22 0549  WBC 12.7* 10.3 9.1 13.9*  CREATININE 1.25 1.37* 1.14 1.15    Estimated Creatinine Clearance: 96.5 mL/min (by C-G formula based on SCr of 1.15 mg/dL).    No Known Allergies  Antimicrobials this admission: Ceftriaxone 2/7 >> Vancomycin 2/9 >>  Dose adjustments this admission:  Microbiology results: 2/9 BCx: ordered 2/7 UCx: 30K S aureus 2/9 Ucx (from cystoscopy):     Thank you for allowing pharmacy to be a part of this patient's care.  DDoreene Eland PharmD, BCPS, BCIDP Work Cell: [phone]/10/2022 4:00 PM

## 2022-04-01 NOTE — Progress Notes (Addendum)
PROGRESS NOTE  Joseph Singh    DOB: 1960/06/13, 62 y.o.  [id number]    Code Status: Full Code   DOA: 03/30/2022   LOS: 1   Brief hospital course  Joseph Singh is a 62 y.o. male with a PMH significant for DM, peripheral neuropathy with left Charcot foot, HTN, nephrolithiasis.  They presented from home to the ED on 03/30/2022 with nausea vomiting and diarrhea with fever and flank pain worsening x 3 weeks. Reports fever of 102 at home. He notes that the symptoms started around initiation of Mounjaro which was subsequently stopped on 03/21/2022.   He had a workup outpatient by PCP which included: WBC of 12,000, glucose 227, sodium 126, lipase 149.  LFTs normal.  Urinalysis showed positive glucose and protein but negative nitrite, rare bacteria, greater than 50 WBCs per hpf. Outpatient CT renal stone study showed the following: IMPRESSION: "Moderate right hydronephrosis is noted secondary to 11 mm calculus n the region of the right ureteropelvic junction.Mild gallbladder distention is noted without cholelithiasis or inflammatory changes. No biliary dilatation is noted."  PCP recommended presentation to ED.   In the ED, it was found that they had Vitals within normal limits.  Significant findings included: CBC had normalized.  Lipase was 60 down from 159.  CMP was significant for elevated transaminases with AST 69 and ALT 55 which were normal earlier in the day.  Creatinine was 1.37 (baseline 0.79) glucose 236.  Sodium 126.  They were initially treated with IV fluids and CTX. Urology was consulted for management   Patient was admitted to medicine service for further workup and management of obstructive renal stone as outlined in detail below.  04/01/22 -stable with chronic back pain.   Assessment & Plan  Principal Problem:   Complicated UTI (urinary tract infection) Active Problems:   Hydronephrosis of right kidney   Obstruction of right ureteropelvic junction (UPJ) due to stone    Vomiting and diarrhea   Abnormal LFTs   Elevated lipase   Uncontrolled type 2 diabetes mellitus with hyperglycemia, without long-term current use of insulin [name]   AKI (acute kidney injury) [name]   Essential hypertension   PVD (peripheral vascular disease) [name]   Hyponatremia   Renal stones  Obstruction of right ureteropelvic junction (UPJ) due to stone Hydronephrosis of right kidney Possible urinary tract infection continue IV Rocephin Urine culture growing Staph aureus 30 K, follow sensitivity report Continue IV hydration urology consulted, appreciate recs Patient is NPO since midnight, scheduled for uretoscopy with lithotripsy today  Continue pain meds PRN   Vomiting and diarrhea, resolved Abnormal LFTs, elevated lipase CT abdomen and pelvis showed distended gallbladder without cholelithiasis or inflammatory changes and no biliary dilatation. RUQ [place] negative.  - antiemetics PRN - advance diet as tolerated   AKI- resolved. Creatinine was 1.37 above baseline of 0.79 at admission >1.14 - IV hydration - Monitor renal function and avoid nephrotoxins  Isotonic hyponatremia- due to fluid shift. Improving. Na+ 126>>129--130 Serum osmolality 278, within normal range - continue IV fluids - frequent neuro checks   Blood in stool- patient's spouse reports scant amount of blood in BM last night. No repeat appearance in subsequent Bms. Patient denies abdominal pain. No straining. No h/o colonoscopy.  - monitor hgb - monitor stool for repeat - recommend following up with routine colonoscopy screening outpatient unless has repeat significant bleed and can consider urgent colonoscopy.    Uncontrolled type 2 diabetes mellitus with hyperglycemia, without long-term current use of insulin [name] - Sliding scale insulin coverage Will hold off on Jardiance and glipizide   PVD (peripheral vascular disease) [name] Continue aspirin and atorvastatin   Essential hypertension- well  controlled Continue amlodipine and metoprolol.  Will hold home lisinopril HCTZ   Anemia, mild due to iron deficiency Iron level 20, transferrin saturation 14%, slightly low, patient will benefit from oral supplement after resolution of infection Start oral supplement on discharge Folic acid level within normal range   Body mass index is 34.72 kg/m.  VTE ppx: SCDs Start: 03/30/22 1953   Diet:     Diet   Diet NPO time specified     Consultants: Urology   Subjective 04/01/22    No significant overnight events, patient denies any abdominal pain, no nausea vomiting or diarrhea.  Denies any GI bleeding. Patient was n.p.o. for urology procedure today.   Objective   Vitals:   03/31/22 0833 03/31/22 2020 04/01/22 0525 04/01/22 1050  BP: 118/61 128/68 124/66 120/74  Pulse: 71 75 75 76  Resp: 18 20 18 16  $ Temp: 98 F (36.7 C) 99.4 F (37.4 C) 98.9 F (37.2 C) 97.7 F (36.5 C)  TempSrc: Oral Oral Oral Temporal  SpO2: 99% 98% 99% 94%  Weight:    129.4 kg  Height:    6' 4"$  (1.93 m)    Intake/Output Summary (Last 24 hours) at 04/01/2022 1140 Last data filed at 04/01/2022 0132 Gross per 24 hour  Intake 2624.11 ml  Output 1550 ml  Net 1074.11 ml   Filed Weights   03/30/22 1620 04/01/22 1050  Weight: 129.4 kg 129.4 kg     Physical Exam:  General: awake, alert, NAD, obese HEENT: atraumatic, clear conjunctiva, anicteric sclera, MMM, hearing grossly normal Respiratory: normal respiratory effort.  No crackles or wheezing Cardiovascular: S1-S2 audible, no murmur appreciated Gastrointestinal: soft, NT, ND, obese Nervous: A&O x3. no gross focal neurologic deficits, normal speech Extremities: moves all equally, no edema, normal tone Skin: dry, intact, normal temperature, normal color. No rashes, lesions or ulcers on exposed skin Psychiatry: normal mood, congruent affect  Labs   I have personally reviewed the following labs and imaging studies CBC    Component Value  Date/Time   WBC 13.9 (H) 04/01/2022 0549   RBC 4.06 (L) 04/01/2022 0549   HGB 12.0 (L) 04/01/2022 0549   HCT 36.6 (L) 04/01/2022 0549   PLT 289 04/01/2022 0549   MCV 90.1 04/01/2022 0549   MCH 29.6 04/01/2022 0549   MCHC 32.8 04/01/2022 0549   RDW 13.6 04/01/2022 0549   LYMPHSABS 1.1 03/30/2022 1754   MONOABS 1.0 03/30/2022 1754   EOSABS 0.1 03/30/2022 1754   BASOSABS 0.0 03/30/2022 1754      Latest Ref Rng & Units 04/01/2022    5:49 AM 03/31/2022    4:05 AM 03/30/2022    5:54 PM  BMP  Glucose 70 - 99 mg/dL 128  93  236   BUN 8 - 23 mg/dL 21  31  33   Creatinine 0.61 - 1.24 mg/dL 1.15  1.14  1.37   Sodium 135 - 145 mmol/L 130  129  126   Potassium 3.5 - 5.1 mmol/L 4.2  3.5  4.0   Chloride 98 - 111 mmol/L 102  95  91   CO2 22 - 32 mmol/L 21  22  24   $ Calcium 8.9 - 10.3 mg/dL 7.5  8.2  8.2     DG OR UROLOGY CYSTO IMAGE [facility] ONLY)  Result Date: 04/01/2022 There  is no interpretation for this exam.  This order is for images obtained during a surgical procedure.  Please See "Surgeries" Tab for more information regarding the procedure.   US Abdomen Limited RUQ (LIVER/GB)  Result Date: 03/30/2022 CLINICAL DATA:  Elevated liver function studies. EXAM: ULTRASOUND ABDOMEN LIMITED RIGHT UPPER QUADRANT COMPARISON:  CT 03/30/2022 FINDINGS: Gallbladder: No gallstones or wall thickening visualized. No sonographic Murphy sign noted by sonographer. Common bile duct: Diameter: 2 mm, normal Liver: No focal lesion identified. Within normal limits in parenchymal echogenicity. Portal vein is patent on color Doppler imaging with normal direction of blood flow towards the liver. Other: Incidental note of right renal hydronephrosis with a shadowing stone in the right renal pelvis measuring about 1.4 cm diameter. This is also demonstrated at previous CT. IMPRESSION: 1. No evidence of cholelithiasis or acute cholecystitis. 2. Right renal hydronephrosis with a stone in the right renal pelvis. Electronically Signed    By: Lucienne Capers M.D.   On: 03/30/2022 20:07    Disposition Plan & Communication  Patient status: Inpatient  Admitted From: Home Planned disposition location: Home Anticipated discharge date: 2/10,  pending urology procedure today, d/c in am if stable  Family Communication: spouse at bedside    Author: Val Riles, MD [facility] 04/01/2022, 11:40 AM   Available by Epic secure chat 7AM-7PM. If 7PM-7AM, please contact night-coverage.  [facility] contact information found on [email or web address].

## 2022-04-01 NOTE — Progress Notes (Signed)
Surgical Physician Order Form [facility] [place] in hospital, okay to call next week to schedule  * Scheduling expectation :  2 to 3 weeks  *Length of Case: 1 hour  *Clearance needed: no  *Anticoagulation Instructions: May continue all anticoagulants  *Aspirin Instructions: Ok to continue Aspirin  *Post-op visit Date/Instructions:   tbd  *Diagnosis: Right Nephrolithiasis  *Procedure: right  Ureteroscopy w/laser lithotripsy & stent exchange [id number])   Additional orders: N/A  -Admit type: OUTpatient  -Anesthesia: General  -VTE Prophylaxis Standing Order SCD's       Other:   -Standing Lab Orders Per Anesthesia    Lab other: None  -Standing Test orders EKG/Chest x-ray per Anesthesia       Test other:   - Medications:  Ancef 2gm IV  -Other orders:  N/A

## 2022-04-01 NOTE — Transfer of Care (Signed)
Immediate Anesthesia Transfer of Care Note  Patient: Joseph Singh  Procedure(s) Performed: CYSTOSCOPY/URETEROSCOPY/HOLMIUM LASER/STENT PLACEMENT (Right: Ureter)  Patient Location: PACU  Anesthesia Type:General  Level of Consciousness: awake, alert , and oriented  Airway & Oxygen Therapy: Patient Spontanous Breathing and Patient connected to face mask oxygen  Post-op Assessment: Report given to RN and Post -op Vital signs reviewed and stable  Post vital signs: stable  Last Vitals:  Vitals Value Taken Time  BP 108/58 04/01/22 1345  Temp 36.1 C 04/01/22 1345  Pulse 74 04/01/22 1346  Resp 19 04/01/22 1346  SpO2 89 % 04/01/22 1346  Vitals shown include unvalidated device data.  Last Pain:  Vitals:   04/01/22 1345  TempSrc:   PainSc: 0-No pain      Patients Stated Pain Goal: 3 ([id number])  Complications: No notable events documented.

## 2022-04-01 NOTE — Anesthesia Procedure Notes (Signed)
Procedure Name: Intubation Date/Time: 04/01/2022 12:29 PM  Performed by: Jacqualin Combes, CRNAPre-anesthesia Checklist: Patient identified, Emergency Drugs available, Suction available and Patient being monitored Patient Re-evaluated:Patient Re-evaluated prior to induction Oxygen Delivery Method: Circle system utilized Preoxygenation: Pre-oxygenation with 100% oxygen Induction Type: IV induction Ventilation: Mask ventilation without difficulty Laryngoscope Size: Mac and 4 Grade View: Grade II Tube type: Oral Tube size: 7.5 mm Number of attempts: 1 Airway Equipment and Method: Stylet and Oral airway Placement Confirmation: ETT inserted through vocal cords under direct vision, positive ETCO2 and breath sounds checked- equal and bilateral Secured at: 24 cm Tube secured with: Tape Dental Injury: Teeth and Oropharynx as per pre-operative assessment  Comments: Large epiglottis, cords clear. CA

## 2022-04-01 NOTE — Interval H&P Note (Signed)
UROLOGY H&P UPDATE  Agree with prior H&P dated 03/31/22.  62 year old male with right-sided flank pain and 1 cm right proximal ureteral stone with hydronephrosis, admitted for pain control, no clinical evidence of infection.  Cardiac: RRR Lungs: CTA bilaterally  Laterality: Right Procedure: Right ureteroscopy, laser lithotripsy, stent placement  Urine: UA benign  We specifically discussed the risks ureteroscopy including bleeding, infection/sepsis, stent related symptoms including flank pain/urgency/frequency/incontinence/dysuria, ureteral injury, inability to access stone, or need for staged or additional procedures.   Billey Co, MD 04/01/2022

## 2022-04-01 NOTE — Anesthesia Preprocedure Evaluation (Signed)
Anesthesia Evaluation  Patient identified by MRN, date of birth, ID band Patient awake    Reviewed: Allergy & Precautions, H&P , NPO status , Patient's Chart, lab work & pertinent test results  History of Anesthesia Complications Negative for: history of anesthetic complications  Airway Mallampati: II  TM Distance: >3 FB Neck ROM: full    Dental  (+) Edentulous Lower, Edentulous Upper, Dental Advidsory Given   Pulmonary neg shortness of breath, neg sleep apnea, neg COPD, neg recent URI, former smoker          Cardiovascular hypertension, (-) angina + Peripheral Vascular Disease  (-) Past MI, (-) Cardiac Stents and (-) DOE (-) dysrhythmias (-) Valvular Problems/Murmurs     Neuro/Psych negative neurological ROS  negative psych ROS   GI/Hepatic negative GI ROS, Neg liver ROS,,,  Endo/Other  diabetes    Renal/GU Renal disease (kidney stones)     Musculoskeletal   Abdominal   Peds  Hematology negative hematology ROS (+)   Anesthesia Other Findings Obese  Past Medical History: No date: Arthritis No date: Diabetes mellitus without complication [name] No date: History of kidney stones     Comment:  h/o No date: Hyperlipidemia No date: Hypertension No date: Neuropathy  Past Surgical History: 08/10/2018: AMPUTATION TOE; Left     Comment:  Procedure: RAY LEFT;  Surgeon: Samara Deist, DPM;                Location: [facility];  Service: Podiatry;  Laterality:               Left; 10/26/2018: IRRIGATION AND DEBRIDEMENT FOOT; Left     Comment:  Procedure: IRRIGATION AND DEBRIDEMENT FOOT, LEFT;                Surgeon: Samara Deist, DPM;  Location: [facility];                Service: Podiatry;  Laterality: Left; 02/25/2016: LOWER EXTREMITY ANGIOGRAPHY; Right     Comment:  Procedure: Lower Extremity Angiography;  Surgeon: Algernon Huxley, MD;  Location: [place] [facility];  Service:               Cardiovascular;   Laterality: Right; 02/27/2017: LOWER EXTREMITY ANGIOGRAPHY; Right     Comment:  Procedure: LOWER EXTREMITY ANGIOGRAPHY;  Surgeon: Algernon Huxley, MD;  Location: [place] [facility];  Service:               Cardiovascular;  Laterality: Right; 08/06/2018: LOWER EXTREMITY ANGIOGRAPHY; Left     Comment:  Procedure: LOWER EXTREMITY ANGIOGRAPHY;  Surgeon: Algernon Huxley, MD;  Location: [place] [facility];  Service:               Cardiovascular;  Laterality: Left; 02/25/2016: LOWER EXTREMITY INTERVENTION     Comment:  Procedure: Lower Extremity Intervention;  Surgeon: Algernon Huxley, MD;  Location: [place] [facility];  Service:               Cardiovascular;; No date: TOE AMPUTATION  BMI    Body Mass Index: 36.52 kg/m      Reproductive/Obstetrics negative OB ROS  Anesthesia Physical Anesthesia Plan  ASA: 3  Anesthesia Plan: General   Post-op Pain Management:    Induction: Intravenous  PONV Risk Score and Plan: Ondansetron, Dexamethasone, Midazolam and Treatment may vary due to age or medical condition  Airway Management Planned: Oral ETT  Additional Equipment:   Intra-op Plan:   Post-operative Plan: Extubation in OR  Informed Consent: I have reviewed the patients History and Physical, chart, labs and discussed the procedure including the risks, benefits and alternatives for the proposed anesthesia with the patient or authorized representative who has indicated his/her understanding and acceptance.     Dental Advisory Given  Plan Discussed with: Anesthesiologist  Anesthesia Plan Comments:         Anesthesia Quick Evaluation

## 2022-04-02 LAB — HEPATIC FUNCTION PANEL
ALT: 52 U/L — ABNORMAL HIGH (ref 0–44)
AST: 37 U/L (ref 15–41)
Albumin: 2.1 g/dL — ABNORMAL LOW (ref 3.5–5.0)
Alkaline Phosphatase: 113 U/L (ref 38–126)
Bilirubin, Direct: 0.1 mg/dL (ref 0.0–0.2)
Indirect Bilirubin: 0.7 mg/dL (ref 0.3–0.9)
Total Bilirubin: 0.8 mg/dL (ref 0.3–1.2)
Total Protein: 6.8 g/dL (ref 6.5–8.1)

## 2022-04-02 LAB — BASIC METABOLIC PANEL
Anion gap: 8 (ref 5–15)
BUN: 24 mg/dL — ABNORMAL HIGH (ref 8–23)
CO2: 20 mmol/L — ABNORMAL LOW (ref 22–32)
Calcium: 8.1 mg/dL — ABNORMAL LOW (ref 8.9–10.3)
Chloride: 106 mmol/L (ref 98–111)
Creatinine, Ser: 0.79 mg/dL (ref 0.61–1.24)
GFR, Estimated: >60 mL/min (ref >60)
Glucose, Bld: 258 mg/dL — ABNORMAL HIGH (ref 70–99)
Potassium: 4 mmol/L (ref 3.5–5.1)
Sodium: 134 mmol/L — ABNORMAL LOW (ref 135–145)

## 2022-04-02 LAB — MISC LABCORP TEST (SEND OUT): Labcorp test code: 81950

## 2022-04-02 LAB — CBC
HCT: 41.5 % (ref 39.0–52.0)
Hemoglobin: 13.7 g/dL (ref 13.0–17.0)
MCH: 29.8 pg (ref 26.0–34.0)
MCHC: 33 g/dL (ref 30.0–36.0)
MCV: 90.2 fL (ref 80.0–100.0)
Platelets: 331 10*3/uL (ref 150–400)
RBC: 4.6 MIL/uL (ref 4.22–5.81)
RDW: 13.7 % (ref 11.5–15.5)
WBC: 13.8 10*3/uL — ABNORMAL HIGH (ref 4.0–10.5)
nRBC: 0 % (ref 0.0–0.2)

## 2022-04-02 LAB — URINE CULTURE
Culture: 30000 — AB
MICRO NUMBER:: 14532539
SPECIMEN QUALITY:: ADEQUATE
Special Requests: NORMAL

## 2022-04-02 LAB — GLUCOSE, CAPILLARY
Glucose-Capillary: 228 mg/dL — ABNORMAL HIGH (ref 70–99)
Glucose-Capillary: 265 mg/dL — ABNORMAL HIGH (ref 70–99)
Glucose-Capillary: 276 mg/dL — ABNORMAL HIGH (ref 70–99)
Glucose-Capillary: 282 mg/dL — ABNORMAL HIGH (ref 70–99)
Glucose-Capillary: 297 mg/dL — ABNORMAL HIGH (ref 70–99)
Glucose-Capillary: 342 mg/dL — ABNORMAL HIGH (ref 70–99)

## 2022-04-02 LAB — PHOSPHORUS: Phosphorus: 2.8 mg/dL (ref 2.5–4.6)

## 2022-04-02 LAB — MAGNESIUM: Magnesium: 1.9 mg/dL (ref 1.7–2.4)

## 2022-04-02 MED ORDER — SODIUM CHLORIDE 0.9 % IV SOLN
2.0000 g | INTRAVENOUS | Status: DC
  Administered 2022-04-02 – 2022-04-03 (×4): 2 g via INTRAVENOUS
  Filled 2022-04-02 (×7): qty 8

## 2022-04-02 MED ORDER — CHLORHEXIDINE GLUCONATE CLOTH 2 % EX PADS
6.0000 | MEDICATED_PAD | Freq: Every day | CUTANEOUS | Status: DC
  Administered 2022-04-02: 6 via TOPICAL

## 2022-04-02 NOTE — Progress Notes (Signed)
  Progress Note   Patient: Joseph Singh [id number] DOB: 1960/03/09 DOA: 03/30/2022     2 DOS: the patient was seen and examined on 04/02/2022   Brief hospital course:  Assessment and Plan: Obstruction of right ureteropelvic junction (UPJ) due to stone Hydronephrosis of right kidney UTI - IV NS 125 cc/hr  - IV nafcillin 2 g q4 hr per urine C&S  - Norco/vicodin 1-2 tablet PO q4 hr PRN  - IV morphine 2 mg q2 hr PRN   Vomiting and diarrhea Abnormal LFTs, elevated lipase - Resolved   AKI (acute kidney injury) ([place]) - Resolved   Uncontrolled type 2 diabetes mellitus with hyperglycemia, without long-term current use of insulin [name] - Novolog SS q4 hr  - ASA 81 mg PO daily  - Lipitor 80 mg PO daily   PVD (peripheral vascular disease) [name] - ASA 81 mg PO daily  - Lipitor 80 mg PO daily   Essential hypertension - Norvasc 10 mg PO daily  - Toprol XL 50 mg PO daily   DVT prophylaxis: SCDs ordered       Subjective: Pt seen and examined at the bedside. WBC remains elevated 13.8 (13.9 yesterday). Spoke with the pharmacist today and pt placed on nafcillin today based on the urine C&S (MSSA). Consider DC once WBC is wnl.   Physical Exam: Vitals:   04/01/22 1914 04/02/22 0000 04/02/22 0407 04/02/22 0715  BP: 123/73 126/76 (!) 140/69 (!) 141/71  Pulse: 70 72 75 75  Resp: 20 20 20 18   Temp: (!) 97.5 F (36.4 C) 97.6 F (36.4 C) (!) 97.4 F (36.3 C) 98.2 F (36.8 C)  TempSrc: Oral Axillary    SpO2: 98% 95% 97% 98%  Weight:      Height:       Physical Exam Constitutional:      Appearance: Normal appearance.  HENT:     Head: Normocephalic and atraumatic.     Mouth/Throat:     Mouth: Mucous membranes are moist.  Cardiovascular:     Rate and Rhythm: Normal rate and regular rhythm.  Pulmonary:     Effort: Pulmonary effort is normal.  Abdominal:     Palpations: Abdomen is soft.  Musculoskeletal:     Cervical back: Neck supple.  Skin:    General: Skin is warm.   Neurological:     Mental Status: He is alert. Mental status is at baseline.  Psychiatric:        Mood and Affect: Mood normal.    Data Reviewed:   Disposition: Status is: Inpatient  Planned Discharge Destination: Home    Time spent: 35 minutes  Author: Lucienne Minks , MD 04/02/2022 12:24 PM  For on call review [email or web address].

## 2022-04-03 LAB — BASIC METABOLIC PANEL
Anion gap: 4 — ABNORMAL LOW (ref 5–15)
BUN: 25 mg/dL — ABNORMAL HIGH (ref 8–23)
CO2: 22 mmol/L (ref 22–32)
Calcium: 8.2 mg/dL — ABNORMAL LOW (ref 8.9–10.3)
Chloride: 108 mmol/L (ref 98–111)
Creatinine, Ser: 0.87 mg/dL (ref 0.61–1.24)
GFR, Estimated: >60 mL/min (ref >60)
Glucose, Bld: 226 mg/dL — ABNORMAL HIGH (ref 70–99)
Potassium: 4.4 mmol/L (ref 3.5–5.1)
Sodium: 134 mmol/L — ABNORMAL LOW (ref 135–145)

## 2022-04-03 LAB — HEPATIC FUNCTION PANEL
ALT: 45 U/L — ABNORMAL HIGH (ref 0–44)
AST: 36 U/L (ref 15–41)
Albumin: 2 g/dL — ABNORMAL LOW (ref 3.5–5.0)
Alkaline Phosphatase: 103 U/L (ref 38–126)
Bilirubin, Direct: 0.1 mg/dL (ref 0.0–0.2)
Indirect Bilirubin: 0.6 mg/dL (ref 0.3–0.9)
Total Bilirubin: 0.7 mg/dL (ref 0.3–1.2)
Total Protein: 6.2 g/dL — ABNORMAL LOW (ref 6.5–8.1)

## 2022-04-03 LAB — CBC
HCT: 37.5 % — ABNORMAL LOW (ref 39.0–52.0)
Hemoglobin: 12.1 g/dL — ABNORMAL LOW (ref 13.0–17.0)
MCH: 29.4 pg (ref 26.0–34.0)
MCHC: 32.3 g/dL (ref 30.0–36.0)
MCV: 91 fL (ref 80.0–100.0)
Platelets: 346 10*3/uL (ref 150–400)
RBC: 4.12 MIL/uL — ABNORMAL LOW (ref 4.22–5.81)
RDW: 13.8 % (ref 11.5–15.5)
WBC: 13.4 10*3/uL — ABNORMAL HIGH (ref 4.0–10.5)
nRBC: 0 % (ref 0.0–0.2)

## 2022-04-03 LAB — GLUCOSE, CAPILLARY
Glucose-Capillary: 170 mg/dL — ABNORMAL HIGH (ref 70–99)
Glucose-Capillary: 155 mg/dL — ABNORMAL HIGH (ref 70–99)

## 2022-04-03 LAB — URINE CULTURE: Culture: >=100000 — AB

## 2022-04-03 MED ORDER — VITAMIN D 125 MCG (5000 UT) PO CAPS: 1.0000 | ORAL_CAPSULE | Freq: Every day | ORAL | 0 refills | Status: AC

## 2022-04-03 MED ORDER — SULFAMETHOXAZOLE-TRIMETHOPRIM 800-160 MG PO TABS: 1.0000 | ORAL_TABLET | Freq: Two times a day (BID) | ORAL | 0 refills | Status: AC

## 2022-04-03 MED ORDER — VITAMIN B-12 1000 MCG PO TABS: 1000.0000 ug | ORAL_TABLET | Freq: Every day | ORAL | 0 refills | Status: AC

## 2022-04-03 NOTE — Discharge Summary (Signed)
[facility] Discharge Summary   Patient: Joseph Singh [id number]  PCP: Leone Haven, MD  Date of admission: 03/30/2022   Date of discharge:  04/03/2022     Discharge Diagnoses:  Principal Problem:   Complicated UTI (urinary tract infection) Active Problems:   Hydronephrosis of right kidney   Obstruction of right ureteropelvic junction (UPJ) due to stone   Vomiting and diarrhea   Abnormal LFTs   Elevated lipase   Uncontrolled type 2 diabetes mellitus with hyperglycemia, without long-term current use of insulin ([place])   AKI (acute kidney injury) ([place])   Essential hypertension   PVD (peripheral vascular disease) ([place])   Hyponatremia   Renal stones   Admitted From: Home Disposition:  Home   Recommendations for Outpatient Follow-up:  Follow-up with PCP in 1 week Follow-up with urology in 1 week, urine cultures pending, urology will follow, continue antibiotic for 2 weeks. Follow up LABS/TEST:     Diet recommendation: Carb modified diet  Activity: The patient is advised to gradually reintroduce usual activities, as tolerated  Discharge Condition: stable  Code Status: Full code   History of present illness: As per the H and P dictated on admission Hospital Course:  Joseph Singh is a 62 y.o. male with a PMH significant for DM, peripheral neuropathy with left Charcot foot, HTN, nephrolithiasis. They presented from home to the ED on 03/30/2022 with nausea vomiting and diarrhea with fever and flank pain worsening x 3 weeks. Reports fever of 102 at home. He notes that the symptoms started around initiation of Mounjaro which was subsequently stopped on 03/21/2022.   He had a workup outpatient by PCP which included: WBC of 12,000, glucose 227, sodium 126, lipase 149.  LFTs normal.  Urinalysis showed positive glucose and protein but negative nitrite, rare bacteria, greater than 50 WBCs per hpf. Outpatient CT renal stone study showed the following: IMPRESSION: "Moderate  right hydronephrosis is noted secondary to 11 mm calculus n the region of the right ureteropelvic junction.Mild gallbladder distention is noted without cholelithiasis or inflammatory changes. No biliary dilatation is noted." PCP recommended presentation to ED.  In the ED, it was found that they had Vitals within normal limits.  Significant findings included: CBC had normalized.  Lipase was 60 down from 159.  CMP was significant for elevated transaminases with AST 69 and ALT 55 which were normal earlier in the day.  Creatinine was 1.37 (baseline 0.79) glucose 236.  Sodium 126. They were initially treated with IV fluids and CTX. Urology was consulted for management  Patient was admitted to medicine service for further workup and management of obstructive renal stone as outlined in detail below.  Assessment & Plan  # Obstruction of right ureteropelvic junction (UPJ) due to stone # Hydronephrosis of right kidney # MSSA urinary tract infection, s/p IV Rocephin. Urine Cx 100k Staph aureus MSSA.  Patient was discharged on Bactrim twice daily for 2 weeks.  Patient was cleared by urology to discharge home and follow-up as an outpatient. # Vomiting and diarrhea, resolved # Abnormal LFTs, elevated lipase CT abdomen and pelvis showed distended gallbladder without cholelithiasis or inflammatory changes and no biliary dilatation. RUQ [place] negative.  Symptoms resolved.  Patient is able to tolerate diet well. # AKI- resolved. Creatinine was 1.37 above baseline of 0.79 at admission >1.14 AKI resolved, creatinine 0.87 after IV hydration. # Isotonic hyponatremia- due to fluid shift. Improving. Na+ 126>>129--134 toda Serum osmolality 278, within normal range. S/p IVF # Blood in stool- patient's  spouse reports scant amount of blood in BM last night. No repeat appearance in subsequent Bms. Patient denies abdominal pain. No straining. No h/o colonoscopy.  No more GI bleeding, H&H remained stable.  Patient was recommended  to follow-up with GI as an outpatient. # Uncontrolled type 2 diabetes mellitus with hyperglycemia, without long-term current use of insulin.  Resumed home medications on discharge, advised to continue diabetic diet and monitor FSBG (fingerstick blood glucose) at home. # PVD (peripheral vascular disease) Continue aspirin and atorvastatin # Essential hypertension- well controlled. Continue amlodipine and metoprolol. lisinopril and HCTZ were held during hospital stay, resumed on discharge.  Patient was advised to monitor BP at home and follow with PCP to titrate medications accordingly. # Anemia, mild due to iron deficiency: Iron level 20, transferrin saturation 14%, slightly low, patient will benefit from oral supplement after resolution of infection. Started oral supplement on discharge. Folic acid level within normal range.  Vitamin B12 level is 392, recommend to follow with PCP to repeat levels in 3 to 6 months and start B12 supplement before it drops further down. # Vitamin D insufficiency, continue vitamin D 50,000 units p.o. weekly.  Body mass index is 34.72 kg/m.  Nutrition Interventions:    - Patient was instructed, not to drive, operate heavy machinery, perform activities at heights, swimming or participation in water activities or provide baby sitting services while on Pain, Sleep and Anxiety Medications; until his outpatient Physician has advised to do so again.  - Also recommended to not to take more than prescribed Pain, Sleep and Anxiety Medications.  Patient was ambulatory without any assistance. On the day of the discharge the patient's vitals were stable, and no other acute medical condition were reported by patient. the patient was felt safe to be discharge at Home .  Consultants: Urology Procedures: On 04/01/22 Cystoscopy, right ureteroscopy, right retrograde pyelogram with intraoperative interpretation, right ureteral stent placement   Discharge Exam: General: Appear in no  distress, no Rash; Oral Mucosa Clear, moist. Cardiovascular: S1 and S2 Present, no Murmur, Respiratory: normal respiratory effort, Bilateral Air entry present and no Crackles, no wheezes Abdomen: Bowel Sound present, Soft and no tenderness, no hernia Extremities: no Pedal edema, no calf tenderness Neurology: alert and oriented to time, place, and person affect appropriate.  Filed Weights   03/30/22 1620 04/01/22 1050  Weight: 129.4 kg 129.4 kg   Vitals:   04/03/22 0400 04/03/22 0746  BP: 128/72 130/71  Pulse: 80 (!) 55  Resp: 20 20  Temp: 98.3 F (36.8 C) 97.6 F (36.4 C)  SpO2: 97% 98%    DISCHARGE MEDICATION: Allergies as of 04/03/2022   No Known Allergies      Medication List     TAKE these medications    amLODipine 10 MG tablet Commonly known as: NORVASC TAKE 1 TABLET BY MOUTH DAILY   aspirin EC 81 MG tablet Take 1 tablet (81 mg total) by mouth daily.   atorvastatin 80 MG tablet Commonly known as: LIPITOR TAKE ONE TABLET BY MOUTH EVERY DAY   blood glucose meter kit and supplies Kit E11.51, check 2x daily, dispense based on patient and insurance preference   clopidogrel 75 MG tablet Commonly known as: PLAVIX TAKE ONE TABLET BY MOUTH EVERY DAY What changed: additional instructions   cyanocobalamin 1000 MCG tablet Commonly known as: VITAMIN B12 Take 1 tablet (1,000 mcg total) by mouth daily.   gabapentin 300 MG capsule Commonly known as: NEURONTIN TAKE 1 CAPSULE BY MOUTH FOUR TIMES DAILY  glipiZIDE 10 MG 24 hr tablet Commonly known as: GLUCOTROL XL TAKE ONE TABLET BY MOUTH EACH DAY WITH BREAKFAST What changed: See the new instructions.   glucose blood test strip Commonly known as: ONE TOUCH ULTRA TEST Check blood sugar twice daily Dx code: E11.9   Jardiance 25 MG Tabs tablet Generic drug: empagliflozin TAKE 1 TABLET BY MOUTH ONCE DAILY What changed: how much to take   lisinopril-hydrochlorothiazide 10-12.5 MG tablet Commonly known as:  ZESTORETIC TAKE 1 TABLET BY MOUTH DAILY   metFORMIN 1000 MG tablet Commonly known as: GLUCOPHAGE TAKE ONE TABLET BY MOUTH TWICE DAILY WITH A MEAL   metoprolol succinate 50 MG 24 hr tablet Commonly known as: TOPROL-XL TAKE ONE TABLET BY MOUTH ONCE DAILY WITHOR IMMEDIATELY FOLLOWING A MEAL   sulfamethoxazole-trimethoprim 800-160 MG tablet Commonly known as: BACTRIM DS Take 1 tablet by mouth 2 (two) times daily for 14 days.   tirzepatide 2.5 MG/0.5ML Pen Commonly known as: MOUNJARO Inject 2.5 mg into the skin once a week for 28 days.   tirzepatide 5 MG/0.5ML Pen Commonly known as: MOUNJARO Inject 5 mg into the skin once a week. Start after finishing the 28 days of the 2.5 mg  dose.   traMADol 50 MG tablet Commonly known as: ULTRAM TAKE 1 TABLET BY MOUTH EVERY 8 HOURS AS NEEDED. FOR MODERATE PAIN   Vitamin D 125 MCG (5000 UT) Caps Take 1 capsule by mouth daily.       No Known Allergies Discharge Instructions     Call MD for:  difficulty breathing, headache or visual disturbances   Complete by: As directed    Call MD for:  extreme fatigue   Complete by: As directed    Call MD for:  persistant dizziness or light-headedness   Complete by: As directed    Call MD for:  persistant nausea and vomiting   Complete by: As directed    Call MD for:  severe uncontrolled pain   Complete by: As directed    Call MD for:  temperature >100.4   Complete by: As directed    Diet - low sodium heart healthy   Complete by: As directed    Discharge instructions   Complete by: As directed    Follow-up with PCP in 1 week Follow-up with urology in 1 week, urine cultures pending, urology will follow, continue antibiotic for 2 weeks.   Increase activity slowly   Complete by: As directed    No wound care   Complete by: As directed        The results of significant diagnostics from this hospitalization (including imaging, microbiology, ancillary and laboratory) are listed below for  reference.    Significant Diagnostic Studies: DG OR UROLOGY CYSTO IMAGE ([facility] ONLY)  Result Date: 04/01/2022 There is no interpretation for this exam.  This order is for images obtained during a surgical procedure.  Please See "Surgeries" Tab for more information regarding the procedure.   DG Chest 2 View  Result Date: 03/30/2022 CLINICAL DATA:  Dyspnea on exertion for a number of months. History of COVID EXAM: CHEST - 2 VIEW COMPARISON:  None Available. FINDINGS: Heart size and pulmonary vascularity are normal. Mild interstitial changes demonstrated in the lung bases suggesting some scarring or fibrosis. No airspace disease or consolidation. No pleural effusions. No pneumothorax. Mediastinal contours appear intact. Degenerative changes in the spine. IMPRESSION: Interstitial changes in the lung bases may represent scarring or fibrosis. No focal consolidation. Electronically Signed   By: Gwyndolyn Saxon  Gerilyn Nestle M.D.   On: 03/30/2022 20:08   US Abdomen Limited RUQ (LIVER/GB)  Result Date: 03/30/2022 CLINICAL DATA:  Elevated liver function studies. EXAM: ULTRASOUND ABDOMEN LIMITED RIGHT UPPER QUADRANT COMPARISON:  CT 03/30/2022 FINDINGS: Gallbladder: No gallstones or wall thickening visualized. No sonographic Murphy sign noted by sonographer. Common bile duct: Diameter: 2 mm, normal Liver: No focal lesion identified. Within normal limits in parenchymal echogenicity. Portal vein is patent on color Doppler imaging with normal direction of blood flow towards the liver. Other: Incidental note of right renal hydronephrosis with a shadowing stone in the right renal pelvis measuring about 1.4 cm diameter. This is also demonstrated at previous CT. IMPRESSION: 1. No evidence of cholelithiasis or acute cholecystitis. 2. Right renal hydronephrosis with a stone in the right renal pelvis. Electronically Signed   By: Lucienne Capers M.D.   On: 03/30/2022 20:07   CT RENAL STONE STUDY  Result Date: 03/30/2022 CLINICAL DATA:   Right lower quadrant abdominal pain. EXAM: CT ABDOMEN AND PELVIS WITHOUT CONTRAST TECHNIQUE: Multidetector CT imaging of the abdomen and pelvis was performed following the standard protocol without IV contrast. RADIATION DOSE REDUCTION: This exam was performed according to the departmental dose-optimization program which includes automated exposure control, adjustment of the mA and/or kV according to patient size and/or use of iterative reconstruction technique. COMPARISON:  None Available. FINDINGS: Lower chest: No acute abnormality. Hepatobiliary: No cholelithiasis is noted. Mild gallbladder distention is noted. Liver is grossly unremarkable on these unenhanced images. Pancreas: Unremarkable. No pancreatic ductal dilatation or surrounding inflammatory changes. Spleen: Normal in size without focal abnormality. Adrenals/Urinary Tract: Adrenal glands appear normal. Moderate right hydronephrosis is noted secondary to 11 mm calculus in the region of the right ureteropelvic junction. Urinary bladder is unremarkable. Stomach/Bowel: Stomach is within normal limits. Appendix appears normal. No evidence of bowel wall thickening, distention, or inflammatory changes. Vascular/Lymphatic: Aortic atherosclerosis. No enlarged abdominal or pelvic lymph nodes. Reproductive: Prostate is unremarkable. Other: No abdominal wall hernia or abnormality. No abdominopelvic ascites. Musculoskeletal: No acute or significant osseous findings. IMPRESSION: Moderate right hydronephrosis is noted secondary to 11 mm calculus in the region of the right ureteropelvic junction. Mild gallbladder distention is noted without cholelithiasis or inflammatory changes. No biliary dilatation is noted. Aortic Atherosclerosis (ICD10-I70.0). Electronically Signed   By: Marijo Conception M.D.   On: 03/30/2022 09:48    Microbiology: Recent Results (from the past 240 hour(s))  Urine Culture     Status: Abnormal   Collection Time: 03/30/22  9:04 AM   Specimen:  Urine  Result Value Ref Range Status   MICRO NUMBER: [id number]  Final   SPECIMEN QUALITY: Adequate  Final   Sample Source URINE  Final   STATUS: FINAL  Final   ISOLATE 1: Staphylococcus aureus (A)  Final    Comment: 50,000-100,000 CFU/mL of Staphylococcus aureus      Susceptibility   Staphylococcus aureus - URINE CULTURE POSITIVE 1    VANCOMYCIN <=0.5 Sensitive     CIPROFLOXACIN <=0.5 Sensitive     LEVOFLOXACIN 0.25 Sensitive     GENTAMICIN <=0.5 Sensitive     NITROFURANTOIN <=16 Sensitive     OXACILLIN* <=0.25 Sensitive      * Oxacillin susceptible staphylococci are susceptible to other penicillinase-stable penicillins (e.g., methicillin, nafcillin), beta- lactam/beta-lactamase inhibitor combinations, and cephems with staphylococcal indications, including cefazolin.     TETRACYCLINE <=1 Sensitive     TRIMETH/SULFA* <=10 Sensitive      * Oxacillin susceptible staphylococci are susceptible to other penicillinase-stable penicillins (  e.g., methicillin, nafcillin), beta- lactam/beta-lactamase inhibitor combinations, and cephems with staphylococcal indications, including cefazolin. Legend: S = Susceptible  I = Intermediate R = Resistant  NS = Not susceptible * = Not tested  NR = Not reported **NN = See antimicrobic comments   Urine Culture (for pregnant, neutropenic or urologic patients or patients with an indwelling urinary catheter)     Status: Abnormal   Collection Time: 03/30/22  5:54 PM   Specimen: Urine, Random  Result Value Ref Range Status   Specimen Description   Final    URINE, RANDOM Performed at [facility], [address] [place], [place] [address]    Special Requests   Final    Normal Performed at [facility], [place], [place] [address]    Culture 30,000 COLONIES/mL STAPHYLOCOCCUS AUREUS (A)  Final   Report Status 04/02/2022 FINAL  Final   Organism ID, Bacteria STAPHYLOCOCCUS AUREUS (A)  Final      Susceptibility    Staphylococcus aureus - MIC*    CIPROFLOXACIN <=0.5 SENSITIVE Sensitive     GENTAMICIN <=0.5 SENSITIVE Sensitive     NITROFURANTOIN <=16 SENSITIVE Sensitive     OXACILLIN <=0.25 SENSITIVE Sensitive     TETRACYCLINE <=1 SENSITIVE Sensitive     VANCOMYCIN <=0.5 SENSITIVE Sensitive     TRIMETH/SULFA <=10 SENSITIVE Sensitive     CLINDAMYCIN <=0.25 SENSITIVE Sensitive     RIFAMPIN <=0.5 SENSITIVE Sensitive     Inducible Clindamycin NEGATIVE Sensitive     * 30,000 COLONIES/mL STAPHYLOCOCCUS AUREUS  Resp panel by RT-PCR (RSV, Flu A&B, Covid) Anterior Nasal Swab     Status: None   Collection Time: 03/30/22  6:26 PM   Specimen: Anterior Nasal Swab  Result Value Ref Range Status   SARS Coronavirus 2 by RT PCR NEGATIVE NEGATIVE Final    Comment: (NOTE) SARS-CoV-2 target nucleic acids are NOT DETECTED.  The SARS-CoV-2 RNA is generally detectable in upper respiratory specimens during the acute phase of infection. The lowest concentration of SARS-CoV-2 viral copies this assay can detect is 138 copies/mL. A negative result does not preclude SARS-Cov-2 infection and should not be used as the sole basis for treatment or other patient management decisions. A negative result may occur with  improper specimen collection/handling, submission of specimen other than nasopharyngeal swab, presence of viral mutation(s) within the areas targeted by this assay, and inadequate number of viral copies(<138 copies/mL). A negative result must be combined with clinical observations, patient history, and epidemiological information. The expected result is Negative.  Fact Sheet for Patients:  EntrepreneurPulse.com.au  Fact Sheet for Healthcare Providers:  IncredibleEmployment.be  This test is no t yet approved or cleared by the [place] FDA and  has been authorized for detection and/or diagnosis of SARS-CoV-2 by FDA under an Emergency Use Authorization (EUA). This EUA  will remain  in effect (meaning this test can be used) for the duration of the COVID-19 declaration under Section 564(b)(1) of the Act, 21 U.S.C.section 360bbb-3(b)(1), unless the authorization is terminated  or revoked sooner.       Influenza A by PCR NEGATIVE NEGATIVE Final   Influenza B by PCR NEGATIVE NEGATIVE Final    Comment: (NOTE) The Xpert Xpress SARS-CoV-2/FLU/RSV plus assay is intended as an aid in the diagnosis of influenza from Nasopharyngeal swab specimens and should not be used as a sole basis for treatment. Nasal washings and aspirates are unacceptable for Xpert Xpress SARS-CoV-2/FLU/RSV testing.  Fact Sheet for Patients: EntrepreneurPulse.com.au  Fact Sheet  for Healthcare Providers: IncredibleEmployment.be  This test is not yet approved or cleared by the [place] and has been authorized for detection and/or diagnosis of SARS-CoV-2 by FDA under an Emergency Use Authorization (EUA). This EUA will remain in effect (meaning this test can be used) for the duration of the COVID-19 declaration under Section 564(b)(1) of the Act, 21 U.S.C. section 360bbb-3(b)(1), unless the authorization is terminated or revoked.     Resp Syncytial Virus by PCR NEGATIVE NEGATIVE Final    Comment: (NOTE) Fact Sheet for Patients: EntrepreneurPulse.com.au  Fact Sheet for Healthcare Providers: IncredibleEmployment.be  This test is not yet approved or cleared by the [place] FDA and has been authorized for detection and/or diagnosis of SARS-CoV-2 by FDA under an Emergency Use Authorization (EUA). This EUA will remain in effect (meaning this test can be used) for the duration of the COVID-19 declaration under Section 564(b)(1) of the Act, 21 U.S.C. section 360bbb-3(b)(1), unless the authorization is terminated or revoked.  Performed at [facility], [address] [place], [place]  [address]   Urine Culture     Status: Abnormal   Collection Time: 04/01/22 12:55 PM   Specimen: Urine, Cystoscope  Result Value Ref Range Status   Specimen Description   Final    URINE, RANDOM Performed at [facility], [address] [place], [place] [address]    Special Requests   Final    NONE Performed at [facility], [address] [place], [place] [address]    Culture >=100,000 COLONIES/mL STAPHYLOCOCCUS AUREUS (A)  Final   Report Status 04/03/2022 FINAL  Final   Organism ID, Bacteria STAPHYLOCOCCUS AUREUS (A)  Final      Susceptibility   Staphylococcus aureus - MIC*    CIPROFLOXACIN <=0.5 SENSITIVE Sensitive     GENTAMICIN <=0.5 SENSITIVE Sensitive     NITROFURANTOIN <=16 SENSITIVE Sensitive     OXACILLIN <=0.25 SENSITIVE Sensitive     TETRACYCLINE <=1 SENSITIVE Sensitive     VANCOMYCIN <=0.5 SENSITIVE Sensitive     TRIMETH/SULFA <=10 SENSITIVE Sensitive     CLINDAMYCIN <=0.25 SENSITIVE Sensitive     RIFAMPIN <=0.5 SENSITIVE Sensitive     Inducible Clindamycin NEGATIVE Sensitive     * >=100,000 COLONIES/mL STAPHYLOCOCCUS AUREUS  Culture, blood (Routine X 2) w Reflex to ID Panel     Status: None (Preliminary result)   Collection Time: 04/01/22  4:47 PM   Specimen: BLOOD  Result Value Ref Range Status   Specimen Description BLOOD BLOOD RIGHT HAND  Final   Special Requests   Final    BOTTLES DRAWN AEROBIC AND ANAEROBIC Blood Culture adequate volume   Culture   Final    NO GROWTH 2 DAYS Performed at [facility], [address] [place], [place] [address]    Report Status PENDING  Incomplete  Culture, blood (Routine X 2) w Reflex to ID Panel     Status: None (Preliminary result)   Collection Time: 04/01/22  4:53 PM   Specimen: BLOOD  Result Value Ref Range Status   Specimen Description BLOOD BLOOD LEFT HAND  Final   Special Requests   Final    BOTTLES DRAWN AEROBIC AND ANAEROBIC Blood Culture adequate volume   Culture   Final    NO  GROWTH 2 DAYS Performed at [facility], [address] [place], [place] [address]    Report Status PENDING  Incomplete     Labs: CBC: Recent Labs  Lab 03/30/22 0904 03/30/22  1754 03/31/22 0405 04/01/22 0549 04/02/22 0557 04/03/22 0213  WBC 12.7* 10.3 9.1 13.9* 13.8* 13.4*  NEUTROABS 10.5* 8.0*  --   --   --   --   HGB 13.3 13.0 12.2* 12.0* 13.7 12.1*  HCT 39.1 39.7 37.9* 36.6* 41.5 37.5*  MCV 90.7 91.1 91.5 90.1 90.2 91.0  PLT 337.0 304 303 289 [id number]   Basic Metabolic Panel: Recent Labs  Lab 03/30/22 1754 03/31/22 0405 04/01/22 0549 04/02/22 0557 04/03/22 0213  NA 126* 129* 130* 134* 134*  K 4.0 3.5 4.2 4.0 4.4  CL 91* 95* 102 106 108  CO2 24 22 21* 20* 22  GLUCOSE 236* 93 128* 258* 226*  BUN 33* 31* 21 24* 25*  CREATININE 1.37* 1.14 1.15 0.79 0.87  CALCIUM 8.2* 8.2* 7.5* 8.1* 8.2*  MG  --  2.1  --  1.9  --   PHOS  --   --   --  2.8  --    Liver Function Tests: Recent Labs  Lab 03/30/22 1754 03/31/22 0405 04/01/22 0549 04/02/22 0557 04/03/22 0213  AST 69* 87* 51* 37 36  ALT 55* 71* 56* 52* 45*  ALKPHOS 104 105 104 113 103  BILITOT 0.7 0.6 0.6 0.8 0.7  PROT 7.2 6.9 6.5 6.8 6.2*  ALBUMIN 2.4* 2.2* 2.0* 2.1* 2.0*   Recent Labs  Lab 03/30/22 0904 03/30/22 1754  LIPASE 149.0* 60*   No results for input(s): "AMMONIA" in the last 168 hours. Cardiac Enzymes: No results for input(s): "CKTOTAL", "CKMB", "CKMBINDEX", "TROPONINI" in the last 168 hours. BNP (last 3 results) No results for input(s): "BNP" in the last 8760 hours. CBG: Recent Labs  Lab 04/02/22 1540 04/02/22 2006 04/02/22 2345 04/03/22 0402 04/03/22 0747  GLUCAP 276* 342* 297* 170* 155*    Time spent: 35 minutes  Signed:  Val Riles  [facility] 04/03/2022 11:19 AM

## 2022-04-04 ENCOUNTER — Other Ambulatory Visit: Payer: Self-pay | Admitting: Student

## 2022-04-04 ENCOUNTER — Telehealth: Payer: Self-pay

## 2022-04-04 ENCOUNTER — Encounter: Payer: Self-pay | Admitting: Family Medicine

## 2022-04-04 ENCOUNTER — Encounter: Payer: Self-pay | Admitting: Urology

## 2022-04-04 MED ORDER — POLYSACCHARIDE IRON COMPLEX 150 MG PO CAPS: 150.0000 mg | ORAL_CAPSULE | Freq: Every day | ORAL | Status: DC

## 2022-04-04 NOTE — Anesthesia Postprocedure Evaluation (Signed)
Anesthesia Post Note  Patient: Joseph Singh  Procedure(s) Performed: CYSTOSCOPY/URETEROSCOPY/HOLMIUM LASER/STENT PLACEMENT (Right: Ureter)  Patient location during evaluation: PACU Anesthesia Type: General Level of consciousness: awake and alert Pain management: pain level controlled Vital Signs Assessment: post-procedure vital signs reviewed and stable Respiratory status: spontaneous breathing, nonlabored ventilation, respiratory function stable and patient connected to nasal cannula oxygen Cardiovascular status: blood pressure returned to baseline and stable Postop Assessment: no apparent nausea or vomiting Anesthetic complications: no   No notable events documented.   Last Vitals:  Vitals:   04/03/22 0400 04/03/22 0746  BP: 128/72 130/71  Pulse: 80 (!) 55  Resp: 20 20  Temp: 36.8 C 36.4 C  SpO2: 97% 98%    Last Pain:  Vitals:   04/03/22 0905  TempSrc:   PainSc: 0-No pain                 Martha Clan

## 2022-04-04 NOTE — Transitions of Care (Post Inpatient/ED Visit) (Unsigned)
   04/04/2022  Name: Joseph Singh MRN: [id number] DOB: 12-Mar-1960  Today's TOC FU Call Status: Today's TOC FU Call Status:: Unsuccessul Call (1st Attempt) Unsuccessful Call (1st Attempt) Date: 04/04/22  Attempted to reach the patient regarding the most recent Inpatient/ED visit.  Follow Up Plan: Additional outreach attempts will be made to reach the patient to complete the Transitions of Care (Post Inpatient/ED visit) call.   Signature Juanda Crumble, [place] Direct Dial [phone]

## 2022-04-05 ENCOUNTER — Telehealth: Payer: Self-pay

## 2022-04-05 NOTE — Progress Notes (Signed)
   [facility] From  Surgery Date: Date: 04/22/2022  Surgeon: Dr. Nickolas Madrid, MD  Inpt ( No  )   Outpt (Yes)   Obs ( No  )   Diagnosis: N20.0 Right Nephrolithiasis  -CPT: [id number]  Surgery: Right Ureteroscopy with Laser Lithotripsy and Stent Exchange  Stop Anticoagulations: No, May continue all and may continue ASA  Cardiac/Medical/Pulmonary Clearance needed: no  *Orders entered into EPIC  Date: 04/05/22   *Case booked in [place]  Date: 04/04/2022  *Notified pt of Surgery: Date: 04/04/2022  PRE-OP UA & CX: No  *Placed into Prior Authorization Work Fabio Bering Date: 04/05/22  Assistant/laser/rep:No

## 2022-04-05 NOTE — Telephone Encounter (Signed)
Noted  

## 2022-04-05 NOTE — Telephone Encounter (Signed)
Pt wife called back and I read the message to her and pt was sent home from the hospital with Bactrim. Pt wife stated pt is doing good and the surgery is 3/1

## 2022-04-05 NOTE — Telephone Encounter (Signed)
I spoke with Joseph Singh. We have discussed possible surgery dates and Friday March 1st, 2024 was agreed upon by all parties. Patient given information about surgery date, what to expect pre-operatively and post operatively.  We discussed that a Pre-Admission Testing office will be calling to set up the pre-op visit that will take place prior to surgery, and that these appointments are typically done over the phone with a Pre-Admissions RN. Informed patient that our office will communicate any additional care to be provided after surgery. Patients questions or concerns were discussed during our call. Advised to call our office should there be any additional information, questions or concerns that arise. Patient verbalized understanding.

## 2022-04-05 NOTE — Transitions of Care (Post Inpatient/ED Visit) (Signed)
   04/05/2022  Name: Joseph Singh MRN: [id number] DOB: 18-Nov-1960  Today's TOC FU Call Status: Today's TOC FU Call Status:: Successful TOC FU Call Competed Unsuccessful Call (1st Attempt) Date: 04/04/22 [facility] FU Call Complete Date: 04/05/22  Transition Care Management Follow-up Telephone Call Date of Discharge: 04/03/22 Discharge Facility: [facility] Type of Discharge: Inpatient Admission Primary Inpatient Discharge Diagnosis:: calculus kidney How have you been since you were released from the hospital?: Better Any questions or concerns?: No  Items Reviewed: Did you receive and understand the discharge instructions provided?: Yes Any new allergies since your discharge?: No Dietary orders reviewed?: Yes Do you have support at home?: Yes  Home Care and Equipment/Supplies: [place] Ordered?: NA Any new equipment or medical supplies ordered?: NA  Functional Questionnaire: Do you need assistance with bathing/showering or dressing?: No Do you need assistance with meal preparation?: No Do you need assistance with eating?: No Do you have difficulty maintaining continence: No Do you need assistance with getting out of bed/getting out of a chair/moving?: No Do you have difficulty managing or taking your medications?: No  Folllow up appointments reviewed: PCP Follow-up appointment confirmed?: Yes Date of PCP follow-up appointment?: 04/08/22 Follow-up Provider: [facility] Follow-up appointment confirmed?: Yes Date of Specialist follow-up appointment?: 04/22/22 Follow-Up Specialty Provider:: surgeon Do you need transportation to your follow-up appointment?: No Do you understand care options if your condition(s) worsen?: Yes-patient verbalized understanding    [place], [place] Direct Dial [phone]

## 2022-04-05 NOTE — Telephone Encounter (Signed)
  LM for pt to cb   Joseph Haven, MD  [name] Clinical Please call the patient and confirm that he was sent home on Bactrim from the hospital.  Please also check in on him and see how he is feeling.

## 2022-04-06 LAB — CULTURE, BLOOD (ROUTINE X 2)
Culture: NO GROWTH
Culture: NO GROWTH
Special Requests: ADEQUATE
Special Requests: ADEQUATE

## 2022-04-06 NOTE — Telephone Encounter (Signed)
Reviewed

## 2022-04-08 ENCOUNTER — Ambulatory Visit (INDEPENDENT_AMBULATORY_CARE_PROVIDER_SITE_OTHER): Payer: 59 | Admitting: Family Medicine

## 2022-04-08 ENCOUNTER — Encounter: Payer: Self-pay | Admitting: Family Medicine

## 2022-04-08 VITALS — BP 118/62 | HR 72 | Temp 97.8°F | Resp 16 | Ht 76.0 in | Wt 286.0 lb

## 2022-04-08 DIAGNOSIS — L03115 Cellulitis of right lower limb: Secondary | ICD-10-CM | POA: Diagnosis not present

## 2022-04-08 DIAGNOSIS — N200 Calculus of kidney: Secondary | ICD-10-CM | POA: Diagnosis not present

## 2022-04-08 DIAGNOSIS — E1142 Type 2 diabetes mellitus with diabetic polyneuropathy: Secondary | ICD-10-CM

## 2022-04-08 DIAGNOSIS — E871 Hypo-osmolality and hyponatremia: Secondary | ICD-10-CM

## 2022-04-08 DIAGNOSIS — E1165 Type 2 diabetes mellitus with hyperglycemia: Secondary | ICD-10-CM

## 2022-04-08 DIAGNOSIS — N390 Urinary tract infection, site not specified: Secondary | ICD-10-CM

## 2022-04-08 LAB — BASIC METABOLIC PANEL
BUN: 15 mg/dL (ref 6–23)
CO2: 27 mEq/L (ref 19–32)
Calcium: 9.6 mg/dL (ref 8.4–10.5)
Chloride: 100 mEq/L (ref 96–112)
Creatinine, Ser: 1.11 mg/dL (ref 0.40–1.50)
GFR: 70.85 mL/min (ref >60.00)
Glucose, Bld: 133 mg/dL — ABNORMAL HIGH (ref 70–99)
Potassium: 4.4 mEq/L (ref 3.5–5.1)
Sodium: 136 mEq/L (ref 135–145)

## 2022-04-08 LAB — CBC WITH DIFFERENTIAL/PLATELET
Basophils Absolute: 0.1 10*3/uL (ref 0.0–0.1)
Basophils Relative: 0.8 % (ref 0.0–3.0)
Eosinophils Absolute: 0.1 10*3/uL (ref 0.0–0.7)
Eosinophils Relative: 1 % (ref 0.0–5.0)
HCT: 39.1 % (ref 39.0–52.0)
Hemoglobin: 12.7 g/dL — ABNORMAL LOW (ref 13.0–17.0)
Lymphocytes Relative: 21.2 % (ref 12.0–46.0)
Lymphs Abs: 2.4 10*3/uL (ref 0.7–4.0)
MCHC: 32.5 g/dL (ref 30.0–36.0)
MCV: 91.8 fl (ref 78.0–100.0)
Monocytes Absolute: 0.9 10*3/uL (ref 0.1–1.0)
Monocytes Relative: 7.9 % (ref 3.0–12.0)
Neutro Abs: 7.7 10*3/uL (ref 1.4–7.7)
Neutrophils Relative %: 69.1 % (ref 43.0–77.0)
Platelets: 475 10*3/uL — ABNORMAL HIGH (ref 150.0–400.0)
RBC: 4.26 Mil/uL (ref 4.22–5.81)
RDW: 14.4 % (ref 11.5–15.5)
WBC: 11.2 10*3/uL — ABNORMAL HIGH (ref 4.0–10.5)

## 2022-04-08 MED ORDER — CEPHALEXIN 500 MG PO CAPS: 500.0000 mg | ORAL_CAPSULE | Freq: Four times a day (QID) | ORAL | 0 refills | Status: DC

## 2022-04-08 MED ORDER — TRAMADOL HCL 50 MG PO TABS: ORAL_TABLET | ORAL | 0 refills | Status: DC

## 2022-04-08 NOTE — Assessment & Plan Note (Signed)
Recheck sodium today.

## 2022-04-08 NOTE — Assessment & Plan Note (Signed)
The area in his right leg is concerning for cellulitis versus venous stasis changes.  We will add Keflex 500 mg 4 times daily to cover for cellulitis given that this has been occurring with him being on Bactrim.  If it worsens at all with spreading redness they will seek medical attention in the emergency department.  If the Keflex is not beneficial and it is not progressively worsening then it is more likely to be related to venous stasis changes.

## 2022-04-08 NOTE — Assessment & Plan Note (Signed)
He will follow-up with urology as planned.

## 2022-04-08 NOTE — Assessment & Plan Note (Signed)
He will complete his course of Bactrim.

## 2022-04-08 NOTE — Patient Instructions (Signed)
Nice to see you. We will check your labs today.  I am going to confer with our clinical pharmacist to see what the next steps for your diabetes would be. I will let you know what the plan is next week.  Please start the keflex for the possible skin infection in your leg. If you have spreading redness from this please seek medical attention. If it is not improving please let me know.

## 2022-04-08 NOTE — Progress Notes (Signed)
[name] Rumps, MD Phone: [phone]  Joseph Singh is a 62 y.o. male who presents today for hospital follow-up.  Kidney stone/complicated UTI: Patient underwent stent placement for this.  He has been on antibiotics and is currently taking Bactrim.  He notes he feels significantly better.  He has no pain, fevers, dysuria, or hematuria.  He follows up in early March for stent removal and treatment of the kidney stone which is still in place with likely additional stent placed afterwards.  He was noted to be hyponatremic and have acute kidney injury though both of those did improve during his hospitalization.  Diabetes: Patient is no longer on Mounjaro.  His lipase was elevated when I checked it though trended down when he was in the emergency department.  His CT scan that I ordered did not reveal any pancreatic inflammation.  He is still on Jardiance and glipizide.  Patient does ask about getting a freestyle libre.  Right leg erythema: Patient is wife report that this has been going on since they got home from the hospital.  He had a wound to the right leg that was due to a cast on his left leg bumping into his right leg several weeks ago.  They think the erythema in his right lower leg has gotten a little worse.  Notably he has been on Bactrim.  Social History   Tobacco Use  Smoking Status Former   Packs/day: 0.50   Years: 35.00   Total pack years: 17.50   Types: Cigarettes   Quit date: 08/09/2018   Years since quitting: 3.6  Smokeless Tobacco Never    Current Outpatient Medications on File Prior to Visit  Medication Sig Dispense Refill   amLODipine (NORVASC) 10 MG tablet TAKE 1 TABLET BY MOUTH DAILY (Patient taking differently: Take 10 mg by mouth daily.) 90 tablet 3   aspirin EC 81 MG tablet Take 1 tablet (81 mg total) by mouth daily. 150 tablet 2   atorvastatin (LIPITOR) 80 MG tablet TAKE ONE TABLET BY MOUTH EVERY DAY (Patient taking differently: Take 80 mg by mouth daily.) 90  tablet 3   blood glucose meter kit and supplies KIT E11.51, check 2x daily, dispense based on patient and insurance preference 1 each 0   Cholecalciferol (VITAMIN D) 125 MCG (5000 UT) CAPS Take 1 capsule by mouth daily. 90 capsule 0   clopidogrel (PLAVIX) 75 MG tablet TAKE ONE TABLET BY MOUTH EVERY DAY (Patient taking differently: Take 75 mg by mouth daily. TAKE ONE TABLET BY MOUTH EVERY DAY) 90 tablet 3   cyanocobalamin (VITAMIN B12) 1000 MCG tablet Take 1 tablet (1,000 mcg total) by mouth daily. 90 tablet 0   gabapentin (NEURONTIN) 300 MG capsule TAKE 1 CAPSULE BY MOUTH FOUR TIMES DAILY (Patient taking differently: Take 300 mg by mouth 4 (four) times daily.) 360 capsule 2   glipiZIDE (GLUCOTROL XL) 10 MG 24 hr tablet TAKE ONE TABLET BY MOUTH EACH DAY WITH BREAKFAST (Patient taking differently: Take 10 mg by mouth daily with breakfast.) 90 tablet 3   glucose blood (ONE TOUCH ULTRA TEST) test strip Check blood sugar twice daily Dx code: E11.9 200 each 0   JARDIANCE 25 MG TABS tablet TAKE 1 TABLET BY MOUTH ONCE DAILY (Patient taking differently: Take 25 mg by mouth daily.) 90 tablet 3   lisinopril-hydrochlorothiazide (ZESTORETIC) 10-12.5 MG tablet TAKE 1 TABLET BY MOUTH DAILY 90 tablet 3   metFORMIN (GLUCOPHAGE) 1000 MG tablet TAKE ONE TABLET BY MOUTH TWICE DAILY WITH A  MEAL (Patient taking differently: Take 1,000 mg by mouth 2 (two) times daily with a meal.) 90 tablet 1   metoprolol succinate (TOPROL-XL) 50 MG 24 hr tablet TAKE ONE TABLET BY MOUTH ONCE DAILY WITHOR IMMEDIATELY FOLLOWING A MEAL 90 tablet 1   sulfamethoxazole-trimethoprim (BACTRIM DS) 800-160 MG tablet Take 1 tablet by mouth 2 (two) times daily for 14 days. 28 tablet 0   tirzepatide (MOUNJARO) 5 MG/0.5ML Pen Inject 5 mg into the skin once a week. Start after finishing the 28 days of the 2.5 mg  dose. 6 mL 2   Current Facility-Administered Medications on File Prior to Visit  Medication Dose Route Frequency Provider Last Rate Last Admin    iron polysaccharides (NIFEREX) capsule 150 mg  150 mg Oral Daily Val Riles, MD         ROS see history of present illness  Objective  Physical Exam Vitals:   04/08/22 1052  BP: 118/62  Pulse: 72  Resp: 16  Temp: 97.8 F (36.6 C)  SpO2: 97%    BP Readings from Last 3 Encounters:  04/08/22 118/62  04/03/22 130/71  03/30/22 120/70   Wt Readings from Last 3 Encounters:  04/08/22 286 lb (129.7 kg)  04/01/22 285 lb 4.4 oz (129.4 kg)  03/30/22 285 lb 3.2 oz (129.4 kg)    Physical Exam Constitutional:      General: He is not in acute distress.    Appearance: He is not diaphoretic.  Cardiovascular:     Rate and Rhythm: Normal rate and regular rhythm.     Heart sounds: Normal heart sounds.  Pulmonary:     Effort: Pulmonary effort is normal.     Breath sounds: Normal breath sounds.  Skin:    General: Skin is warm and dry.  Neurological:     Mental Status: He is alert.    Right leg, the area of erythema is warm, there is some slight firmness to the area, no tenderness, there is 1+ pitting edema to the midshin in this leg  Assessment/Plan: Please see individual problem list.  Kidney stone Assessment & Plan: He will follow-up with urology as planned.  Orders: -     Basic metabolic panel  Diabetic polyneuropathy associated with type 2 diabetes mellitus [name] -     traMADol HCl; TAKE 1 TABLET BY MOUTH EVERY 8 HOURS AS NEEDED. FOR MODERATE PAIN  Dispense: 90 tablet; Refill: 0  Complicated UTI (urinary tract infection) Assessment & Plan: He will complete his course of Bactrim.  Orders: -     Basic metabolic panel -     CBC with Differential/Platelet  Cellulitis of right lower extremity Assessment & Plan: The area in his right leg is concerning for cellulitis versus venous stasis changes.  We will add Keflex 500 mg 4 times daily to cover for cellulitis given that this has been occurring with him being on Bactrim.  If it worsens at all with spreading redness  they will seek medical attention in the emergency department.  If the Keflex is not beneficial and it is not progressively worsening then it is more likely to be related to venous stasis changes.  Orders: -     Cephalexin; Take 1 capsule (500 mg total) by mouth 4 (four) times daily.  Dispense: 28 capsule; Refill: 0  Uncontrolled type 2 diabetes mellitus with hyperglycemia, without long-term current use of insulin ([place]) Assessment & Plan: The patient will remain off of Mounjaro.  I will confer with our clinical pharmacist to  see if this could be an option though I feel as though with his prior elevated lipase this will likely not be an option moving forward.  I will also discussed with our clinical pharmacist whether or not Vania Rea is an appropriate option given his recent urinary tract infection and kidney stone.  Discussed with the patient that we may have to go with insulin moving forward.  For now he will continue glipizide 10 mg daily and Jardiance 25 mg daily.   Hyponatremia Assessment & Plan: Recheck sodium today.      Return in about 3 months (around 07/07/2022).  I have spent 32 minutes in the care of this patient regarding history taking, documentation, review of discharge summary, discussion of plan, communicating with clinical pharmacist. .   [name] Rumps, MD [place]

## 2022-04-08 NOTE — Assessment & Plan Note (Signed)
The patient will remain off of Mounjaro.  I will confer with our clinical pharmacist to see if this could be an option though I feel as though with his prior elevated lipase this will likely not be an option moving forward.  I will also discussed with our clinical pharmacist whether or not Vania Rea is an appropriate option given his recent urinary tract infection and kidney stone.  Discussed with the patient that we may have to go with insulin moving forward.  For now he will continue glipizide 10 mg daily and Jardiance 25 mg daily.

## 2022-04-11 ENCOUNTER — Telehealth: Payer: Self-pay | Admitting: Family Medicine

## 2022-04-11 DIAGNOSIS — E1165 Type 2 diabetes mellitus with hyperglycemia: Secondary | ICD-10-CM

## 2022-04-11 NOTE — Telephone Encounter (Signed)
Please let the patient know that our clinical pharmacist is going to reach out to him some time this week to discuss his diabetes and medication options.

## 2022-04-11 NOTE — Telephone Encounter (Signed)
-----   Message from Osker Mason, [place] sent at 04/08/2022  3:20 PM EST ----- Let me do some digging and get back to you. Put a [id number] and I'll talk to him in the next week.   Catie ----- Message ----- From: Leone Haven, MD Sent: 04/08/2022   1:07 PM EST To: Osker Mason, RPH-CPP  Hey Catie,   I wanted to get your thoughts on this patients diabetes regimen. He was on mounjaro though I recently stopped that related to side effects (nausea, vomiting) that possibly could be due to the kidney stone and kidney infection he had, though his lipase was up on the mounjaro. With the lipase going up would it be an option to retry the mounjaro or does that completely rule that medication out? He is also on jardiance. With his recent kidney infection that may just have been precipitated by the stone would it be a good idea to stop the jardiance? Would the next best option be basal insulin? I can always refer him to you to discuss treatment options as well. Thanks for your help.  Randall Hiss

## 2022-04-11 NOTE — Progress Notes (Signed)
Appt 04/19/2022 @1pm$ 

## 2022-04-12 NOTE — Telephone Encounter (Signed)
Pt advised.

## 2022-04-14 ENCOUNTER — Other Ambulatory Visit: Payer: Self-pay

## 2022-04-14 ENCOUNTER — Telehealth: Payer: Self-pay

## 2022-04-14 ENCOUNTER — Encounter: Payer: Self-pay | Admitting: Family Medicine

## 2022-04-14 ENCOUNTER — Encounter
Admission: RE | Admit: 2022-04-14 | Discharge: 2022-04-14 | Disposition: A | Discharge status: Home / Self Care | Payer: 59 | Source: Ambulatory Visit | Attending: Urology | Admitting: Urology

## 2022-04-14 DIAGNOSIS — Z01812 Encounter for preprocedural laboratory examination: Secondary | ICD-10-CM

## 2022-04-14 HISTORY — DX: Anemia, unspecified: D64.9

## 2022-04-14 HISTORY — DX: Myoneural disorder, unspecified: G70.9

## 2022-04-14 NOTE — Patient Instructions (Addendum)
Your procedure is scheduled on: 04/22/22 - Friday Report to the Registration Desk on the 1st floor of the [facility]. To find out your arrival time, please call [phone] between 1PM - 3PM on: 04/21/22 - Thursday If your arrival time is 6:00 am, do not arrive before that time as the [facility] entrance doors do not open until 6:00 am.  REMEMBER: Instructions that are not followed completely may result in serious medical risk, up to and including death; or upon the discretion of your surgeon and anesthesiologist your surgery may need to be rescheduled.  Do not eat food or drink any liquids after midnight the night before surgery.  No gum chewing or hard candies.   One week prior to surgery: Stop Anti-inflammatories (NSAIDS) such as Advil, Aleve, Ibuprofen, Motrin, Naproxen, Naprosyn and Aspirin based products such as Excedrin, Goody's Powder, BC Powder.   Stop ANY OVER THE COUNTER supplements until after surgery.  You may however, continue to take Tylenol if needed for pain up until the day of surgery.  Continue taking all prescribed medications with the exception of the following:  Hold JARDIANCE beginning 04/19/22. Hold metFORMIN (GLUCOPHAGE) beginning 04/20/22.   TAKE ONLY THESE MEDICATIONS THE MORNING OF SURGERY WITH A SIP OF WATER:  amLODipine (NORVASC)  gabapentin (NEURONTIN)  metoprolol succinate (TOPROL-XL)  traMADol (ULTRAM) if needed   No Alcohol for 24 hours before or after surgery.  No Smoking including e-cigarettes for 24 hours before surgery.  No chewable tobacco products for at least 6 hours before surgery.  No nicotine patches on the day of surgery.  Do not use any "recreational" drugs for at least a week (preferably 2 weeks) before your surgery.  Please be advised that the combination of cocaine and anesthesia may have negative outcomes, up to and including death. If you test positive for cocaine, your surgery will be cancelled.  On the morning of  surgery brush your teeth with toothpaste and water, you may rinse your mouth with mouthwash if you wish. Do not swallow any toothpaste or mouthwash.  Do not wear jewelry, make-up, hairpins, clips or nail polish.  Do not wear lotions, powders, or perfumes.   Do not shave body hair from the neck down 48 hours before surgery.  Contact lenses, hearing aids and dentures may not be worn into surgery.  Do not bring valuables to the hospital. [facility] is not responsible for any missing/lost belongings or valuables.   Notify your doctor if there is any change in your medical condition (cold, fever, infection).  Wear comfortable clothing (specific to your surgery type) to the hospital.  After surgery, you can help prevent lung complications by doing breathing exercises.  Take deep breaths and cough every 1-2 hours. Your doctor may order a device called an Incentive Spirometer to help you take deep breaths. When coughing or sneezing, hold a pillow firmly against your incision with both hands. This is called "splinting." Doing this helps protect your incision. It also decreases belly discomfort.  If you are being admitted to the hospital overnight, leave your suitcase in the car. After surgery it may be brought to your room.  In case of increased patient census, it may be necessary for you, the patient, to continue your postoperative care in the Same Day Surgery department.  If you are being discharged the day of surgery, you will not be allowed to drive home. You will need a responsible individual to drive you home and stay with you for 24  hours after surgery.   If you are taking public transportation, you will need to have a responsible individual with you.  Please call the [facility]. at [phone] if you have any questions about these instructions.  Surgery Visitation Policy:  Patients undergoing a surgery or procedure may have two family members or support persons  with them as long as the person is not COVID-19 positive or experiencing its symptoms.   Inpatient Visitation:    Visiting hours are 7 a.m. to 8 p.m. Up to four visitors are allowed at one time in a patient room. The visitors may rotate out with other people during the day. One designated support person (adult) may remain overnight.  Due to an increase in RSV and influenza rates and associated hospitalizations, children ages 66 and under will not be able to visit patients in [facility]. Masks continue to be strongly recommended.

## 2022-04-14 NOTE — Telephone Encounter (Signed)
We received a surgical clearance via fax.  I sent a copy to Dr. Georges Mouse folder on the S drive and I hand-delivered a copy to Jeralyn Bennett, [place].

## 2022-04-15 MED ORDER — POLYSACCHARIDE IRON COMPLEX 150 MG PO CAPS: 150.0000 mg | ORAL_CAPSULE | Freq: Every day | ORAL | 0 refills | Status: DC

## 2022-04-16 ENCOUNTER — Encounter: Payer: Self-pay | Admitting: Family Medicine

## 2022-04-18 NOTE — Telephone Encounter (Signed)
Lm for pt to cb.

## 2022-04-19 ENCOUNTER — Other Ambulatory Visit: Payer: 59 | Admitting: Pharmacist

## 2022-04-19 MED ORDER — TIRZEPATIDE 2.5 MG/0.5ML ~~LOC~~ SOAJ: 2.5000 mg | SUBCUTANEOUS | 2 refills | Status: DC

## 2022-04-19 MED ORDER — FREESTYLE LIBRE 3 SENSOR MISC: 2 refills | Status: DC

## 2022-04-19 NOTE — Progress Notes (Signed)
04/19/2022 Name: Joseph Singh MRN: [id number] DOB: 1960/08/30  Chief Complaint  Patient presents with   Medication Management   Diabetes   Hypertension   Hyperlipidemia    Joseph Singh is a 62 y.o. year old male who presented for a telephone visit.   They were referred to the pharmacist by their PCP for assistance in managing diabetes.   Subjective:  Care Team: Primary Care Provider: Leone Haven, MD ; Next Scheduled Visit: 06/13/22  Medication Access/Adherence  Current Pharmacy:  [place], [place] - [address] [place] [address] Phone: [phone] Fax: [phone]  [facility] [id number] [place], [place] - [address] [place] [address] Phone: [phone] Fax: [phone]   Patient reports affordability concerns with their medications: No  Patient reports access/transportation concerns to their pharmacy: No  Patient reports adherence concerns with their medications:  No     Diabetes:  Current medications: metformin 1000 mg twice daily, Jardiance 25 mg daily, glipizide XL 10 mg daily   Previously stable on Ozempic therapy; with switch to [facility], he was also in the midst of having renal stone, obstruction, that could have lead to mild lipase elevation. He had only taken 1 dose of Mounjaro 2.5 mg weekly before this happened.   Patient denies hypoglycemic episodes recently s/sx including dizziness, shakiness, sweating, though he has not been on GLP1 lately. Patient denies hyperglycemic symptoms including polyuria, polydipsia, polyphagia, nocturia, neuropathy, blurred vision.  Current meal patterns:  - Breakfast: frozen sausage, egg, and cheese, biscuit, diet ginger ale  - Lunch: salads, mini stouffers lasagna;  - Supper: chicken (sometimes baked BBQ chicken, sometimes fried); chicken pot pies; pork chops; reports he is a meat and potatoes  guy, not as many vegetables - but does like green beans, peas, carrots.  - Snacks: potato chips; chocolate pudding; canned peaches  - Drinks: orange crush flavoring packets; ginger ale  Current physical activity: likes to go to flea markets on the weekends; reports he will go out in the yard on days like today; reports he keeps an eye on the granddaughters   Hypertension:  Current medications: amlodipine 10 mg daily, lisinopril/HCTZ 10/12.5 mg daily, metoprolol succinate 50 mg daily   Hyperlipidemia/ASCVD Risk Reduction  Current lipid lowering medications: atorvastatin 80 mg daily  Antiplatelet regimen: aspirin 81 mg daily  Objective:  Lab Results  Component Value Date   HGBA1C 8.3 (A) 03/09/2022    Lab Results  Component Value Date   CREATININE 1.11 04/08/2022   BUN 15 04/08/2022   NA 136 04/08/2022   K 4.4 04/08/2022   CL 100 04/08/2022   CO2 27 04/08/2022    Lab Results  Component Value Date   CHOL 121 10/06/2021   HDL 33.90 (L) 10/06/2021   LDLCALC 76 10/02/2020   LDLDIRECT 58.0 10/06/2021   TRIG 278.0 (H) 10/06/2021   CHOLHDL 4 10/06/2021    Medications Reviewed Today     Reviewed by Osker Mason, RPH-CPP (Pharmacist) on 04/19/22 at 1305  Med List Status: <None>   Medication Order Taking? Sig Documenting Provider Last Dose Status Informant  amLODipine (NORVASC) 10 MG tablet [id number] Yes TAKE 1 TABLET BY MOUTH DAILY Joseph Haven, MD Taking Active Self, Pharmacy Records  aspirin EC 81 MG tablet [id number] Yes Take 1 tablet (81 mg total) by mouth daily. Algernon Huxley, MD Taking Active  Self, Pharmacy Records  atorvastatin (LIPITOR) 80 MG tablet [id number] Yes TAKE ONE TABLET BY MOUTH EVERY DAY Joseph Haven, MD Taking Active Self, Pharmacy Records  blood glucose meter kit and supplies KIT [id number] Yes E11.51, check 2x daily, dispense based on patient and insurance preference Joseph Haven, MD Taking Active Self, Pharmacy Records  calcium  carbonate (TUMS EX) 750 MG chewable tablet [id number] Yes Chew 2 tablets by mouth as needed for heartburn. [provider] Taking Active Self  Cholecalciferol (VITAMIN D) 125 MCG (5000 UT) CAPS [id number] Yes Take 1 capsule by mouth daily. Val Riles, MD Taking Active   clopidogrel (PLAVIX) 75 MG tablet [id number] Yes TAKE ONE TABLET BY MOUTH EVERY DAY Joseph Haven, MD Taking Active Self, Pharmacy Records  cyanocobalamin (VITAMIN B12) 1000 MCG tablet [id number] Yes Take 1 tablet (1,000 mcg total) by mouth daily. Val Riles, MD Taking Active   gabapentin (NEURONTIN) 300 MG capsule [id number] Yes TAKE 1 CAPSULE BY MOUTH FOUR TIMES DAILY Joseph Haven, MD Taking Active Self, Pharmacy Records  glipiZIDE (GLUCOTROL XL) 10 MG 24 hr tablet [id number] Yes TAKE ONE TABLET BY MOUTH EACH DAY WITH BREAKFAST Joseph Haven, MD Taking Active Self, Pharmacy Records  glucose blood (ONE TOUCH ULTRA TEST) test strip [id number] Yes Check blood sugar twice daily Dx code: E11.9 Joseph Haven, MD Taking Active Self, Pharmacy Records  iron polysaccharides (NIFEREX) 150 MG capsule [id number] No Take 1 capsule (150 mg total) by mouth daily.  Patient not taking: Reported on 04/19/2022   Joseph Haven, MD Not Taking Active   JARDIANCE 25 MG TABS tablet [id number] Yes TAKE 1 TABLET BY MOUTH ONCE DAILY Joseph Haven, MD Taking Active Self, Pharmacy Records  lisinopril-hydrochlorothiazide (ZESTORETIC) 10-12.5 MG tablet [id number] Yes TAKE 1 TABLET BY MOUTH DAILY Joseph Haven, MD Taking Active Self, Pharmacy Records  metFORMIN (GLUCOPHAGE) 1000 MG tablet [id number] Yes TAKE ONE TABLET BY MOUTH TWICE DAILY WITH A MEAL  Patient taking differently: Take 1,000 mg by mouth 2 (two) times daily with a meal.   Kennyth Arnold, FNP Taking Active Self, Pharmacy Records  metoprolol succinate (TOPROL-XL) 50 MG 24 hr tablet [id number] Yes TAKE ONE TABLET BY MOUTH ONCE DAILY WITHOR IMMEDIATELY FOLLOWING A  MEAL Joseph Haven, MD Taking Active Self, Pharmacy Records  traMADol (ULTRAM) 50 MG tablet [id number] Yes TAKE 1 TABLET BY MOUTH EVERY 8 HOURS AS NEEDED. FOR MODERATE PAIN Joseph Haven, MD Taking Active               Assessment/Plan:   Diabetes: - Currently uncontrolled - Reviewed long term cardiovascular and renal outcomes of uncontrolled blood sugar - Reviewed goal A1c, goal fasting, and goal 2 hour post prandial glucose - Reviewed dietary modifications including: importance of focus on lean proteins, vegetables and fruits, whole grains, reduced sugary beverages.  - Reviewed lifestyle modifications including: increase physical activity as tolerated.  - Recommend to restart Mounjaro, as lipase elevations could have been related to renal stone. If development of abdominal pain, discontinue GLP1 and will start basal insulin at that time. Patient will restart Mounjaro next week after stent placement.  - Discussed benefit of CGM to help evaluate hypoglycemia related to glipizide. Patient interested. Will submit PA today.  - Recommend to check glucose continuously   Hypertension: - Currently controlled - Recommend to continue current regimen at this time  Hyperlipidemia/ASCVD Risk Reduction: - Currently controlled.  - Recommend to continue current regimen at this time  Follow  Up Plan: pending CGM access  Joseph Singh, PharmD, [place], [facility] [phone]

## 2022-04-19 NOTE — Progress Notes (Signed)
  Perioperative Services Pre-Admission/Anesthesia Testing    Date: 04/19/22  Name: Joseph Singh MRN:   [id number]  Re: GLP-1 clearance and provider recommendations   Planned Surgical Procedure(s):    Case: [id number] Date/Time: 04/22/22 1105   Procedure: CYSTOSCOPY/URETEROSCOPY/HOLMIUM LASER/STENT EXCHANGE (Right)   Anesthesia type: General   Pre-op diagnosis: Right Nephrolithiasis   Location: [facility] OR ROOM 10 / [facility]   Surgeons: Billey Co, MD      Clinical Notes:  Patient is scheduled for the above procedure with the indicated provider/surgeon. In review of his medication reconciliation it was noted that patient is on a prescribed GLP-1 medication. Per guidelines issued by the American Society of Anesthesiologists (ASA), it is recommended that these medications be held for 7 days prior to the patient undergoing any type of elective surgical procedure. The patient is taking the following GLP-1 medication:  []$  SEMAGLUTIDE   []$  EXENATIDE  []$  LIRAGLUTIDE   []$  LIXISENATIDE  []$  DULAGLUTIDE     [x]$  OTHER GLP-1/GIP medication: tirzepatide  Reached out to prescribing provider Caryl Bis, MD) to make them aware of the guidelines from anesthesia. Given that this patient takes the prescribed GLP-1 medication for his  diabetes diagnosis, rather than for weight loss, recommendations from the prescribing provider were solicited. Prescribing provider made aware of the following so that informed decision/POC can be developed for this patient that may be taking medications belonging to these drug classes:  Oral GLP-1 medications will be held 1 day prior to surgery.  Injectable GLP-1 medications will be held 7 days prior to surgery.  Metformin is routinely held 48 hours prior to surgery due to renal concerns, potential need for contrasted imaging perioperatively, and the potential for tissue hypoxia leading to drug induced lactic acidosis.  All SGLT2i medications  are held 72 hours prior to surgery as they can be associated with the increased potential for developing euglycemic diabetic ketoacidosis (EDKA).   Impression and Plan:  CORTRELL ANGELO is on a prescribed GLP-1 medication, which induces the known side effect of decreased gastric emptying. Efforts are bring made to mitigate the risk of perioperative hyperglycemic events, as elevated blood glucose levels have been found to contribute to intra/postoperative complications. Additionally, hyperglycemic extremes can potentially necessitate the postponing of a patient's elective case in order to better optimize perioperative glycemic control, again with the aforementioned guidelines in place. With this in mind, recommendations have been sought from the prescribing provider, who has cleared patient to proceed with holding the prescribed GLP-1/GIP as per the guidelines from the ASA.   Provider recommending: no further recommendations received from the prescribing provider.  Copy of signed clearance and recommendations placed on patient's chart for inclusion in their medical record and for review by the surgical/anesthetic team on the day of his procedure.   Honor Loh, MSN, APRN, FNP-C, CEN [facility]  Peri-operative Services Nurse Practitioner Phone: [phone] 04/19/22 9:09 AM  NOTE: This note has been prepared using Dragon dictation software. Despite my best ability to proofread, there is always the potential that unintentional transcriptional errors may still occur from this process.

## 2022-04-19 NOTE — Patient Instructions (Addendum)
Kuzey,   We will see what the insurance says about the [facility] prior authorization.   Restart the [facility] on Monday.   Catie Hedwig Morton, PharmD, [place], [facility] [phone]

## 2022-04-19 NOTE — Progress Notes (Signed)
Called in to total care

## 2022-04-21 MED ORDER — SODIUM CHLORIDE 0.9 % IV SOLN: INTRAVENOUS | Status: DC

## 2022-04-21 MED ORDER — ORAL CARE MOUTH RINSE: 15.0000 mL | Freq: Once | OROMUCOSAL | Status: AC

## 2022-04-21 MED ORDER — CEFAZOLIN SODIUM-DEXTROSE 2-4 GM/100ML-% IV SOLN: 2.0000 g | INTRAVENOUS | Status: DC

## 2022-04-21 MED ORDER — CHLORHEXIDINE GLUCONATE 0.12 % MT SOLN: 15.0000 mL | Freq: Once | OROMUCOSAL | Status: AC

## 2022-04-21 MED ORDER — FAMOTIDINE 20 MG PO TABS: 20.0000 mg | ORAL_TABLET | Freq: Once | ORAL | Status: AC

## 2022-04-22 ENCOUNTER — Ambulatory Visit: Payer: 59 | Admitting: Urgent Care

## 2022-04-22 ENCOUNTER — Encounter
Admission: RE | Disposition: A | Discharge status: Home / Self Care | Payer: Self-pay | Source: Home / Self Care | Attending: Urology

## 2022-04-22 ENCOUNTER — Other Ambulatory Visit: Payer: Self-pay

## 2022-04-22 ENCOUNTER — Ambulatory Visit
Admission: RE | Admit: 2022-04-22 | Discharge: 2022-04-22 | Disposition: A | Discharge status: Home / Self Care | Payer: 59 | Attending: Urology | Admitting: Urology

## 2022-04-22 ENCOUNTER — Ambulatory Visit: Payer: 59

## 2022-04-22 ENCOUNTER — Encounter: Payer: Self-pay | Admitting: Urology

## 2022-04-22 DIAGNOSIS — E119 Type 2 diabetes mellitus without complications: Secondary | ICD-10-CM | POA: Diagnosis not present

## 2022-04-22 DIAGNOSIS — N200 Calculus of kidney: Secondary | ICD-10-CM | POA: Diagnosis present

## 2022-04-22 DIAGNOSIS — E785 Hyperlipidemia, unspecified: Secondary | ICD-10-CM | POA: Diagnosis not present

## 2022-04-22 DIAGNOSIS — Z87891 Personal history of nicotine dependence: Secondary | ICD-10-CM | POA: Insufficient documentation

## 2022-04-22 DIAGNOSIS — Z01812 Encounter for preprocedural laboratory examination: Secondary | ICD-10-CM

## 2022-04-22 DIAGNOSIS — I10 Essential (primary) hypertension: Secondary | ICD-10-CM | POA: Diagnosis not present

## 2022-04-22 DIAGNOSIS — Z87442 Personal history of urinary calculi: Secondary | ICD-10-CM | POA: Insufficient documentation

## 2022-04-22 HISTORY — PX: CYSTOSCOPY/URETEROSCOPY/HOLMIUM LASER/STENT PLACEMENT: SHX6546

## 2022-04-22 LAB — GLUCOSE, CAPILLARY
Glucose-Capillary: 179 mg/dL — ABNORMAL HIGH (ref 70–99)
Glucose-Capillary: 154 mg/dL — ABNORMAL HIGH (ref 70–99)

## 2022-04-22 SURGERY — CYSTOSCOPY/URETEROSCOPY/HOLMIUM LASER/STENT PLACEMENT
Anesthesia: General | Site: Ureter | Laterality: Right

## 2022-04-22 MED ORDER — SUGAMMADEX SODIUM 500 MG/5ML IV SOLN
INTRAVENOUS | Status: DC | PRN
  Administered 2022-04-22: 450 mg via INTRAVENOUS

## 2022-04-22 MED ORDER — PHENYLEPHRINE 80 MCG/ML (10ML) SYRINGE FOR IV PUSH (FOR BLOOD PRESSURE SUPPORT)
PREFILLED_SYRINGE | INTRAVENOUS | Status: AC
  Filled 2022-04-22: qty 10

## 2022-04-22 MED ORDER — PHENYLEPHRINE 80 MCG/ML (10ML) SYRINGE FOR IV PUSH (FOR BLOOD PRESSURE SUPPORT)
PREFILLED_SYRINGE | INTRAVENOUS | Status: DC | PRN
  Administered 2022-04-22 (×3): 80 ug via INTRAVENOUS

## 2022-04-22 MED ORDER — EPHEDRINE SULFATE (PRESSORS) 50 MG/ML IJ SOLN
INTRAMUSCULAR | Status: DC | PRN
  Administered 2022-04-22 (×2): 5 mg via INTRAVENOUS

## 2022-04-22 MED ORDER — ROCURONIUM BROMIDE 10 MG/ML (PF) SYRINGE
PREFILLED_SYRINGE | INTRAVENOUS | Status: AC
  Filled 2022-04-22: qty 10

## 2022-04-22 MED ORDER — PROPOFOL 10 MG/ML IV BOLUS
INTRAVENOUS | Status: AC
  Filled 2022-04-22: qty 20

## 2022-04-22 MED ORDER — IOHEXOL 180 MG/ML  SOLN
INTRAMUSCULAR | Status: DC | PRN
  Administered 2022-04-22: 10 mL

## 2022-04-22 MED ORDER — ROCURONIUM BROMIDE 100 MG/10ML IV SOLN
INTRAVENOUS | Status: DC | PRN
  Administered 2022-04-22: 50 mg via INTRAVENOUS

## 2022-04-22 MED ORDER — CIPROFLOXACIN IN D5W 400 MG/200ML IV SOLN
INTRAVENOUS | Status: DC | PRN
  Administered 2022-04-22: 400 mg via INTRAVENOUS

## 2022-04-22 MED ORDER — FENTANYL CITRATE (PF) 100 MCG/2ML IJ SOLN: 25.0000 ug | INTRAMUSCULAR | Status: DC | PRN

## 2022-04-22 MED ORDER — SODIUM CHLORIDE 0.9 % IR SOLN
Status: DC | PRN
  Administered 2022-04-22: 3000 mL via INTRAVESICAL

## 2022-04-22 MED ORDER — CEFAZOLIN SODIUM-DEXTROSE 2-4 GM/100ML-% IV SOLN
INTRAVENOUS | Status: AC
  Filled 2022-04-22: qty 100

## 2022-04-22 MED ORDER — FAMOTIDINE 20 MG PO TABS
ORAL_TABLET | ORAL | Status: AC
  Administered 2022-04-22: 20 mg via ORAL
  Filled 2022-04-22: qty 1

## 2022-04-22 MED ORDER — FENTANYL CITRATE (PF) 100 MCG/2ML IJ SOLN
INTRAMUSCULAR | Status: DC | PRN
  Administered 2022-04-22: 100 ug via INTRAVENOUS

## 2022-04-22 MED ORDER — FENTANYL CITRATE (PF) 100 MCG/2ML IJ SOLN
INTRAMUSCULAR | Status: AC
  Filled 2022-04-22: qty 2

## 2022-04-22 MED ORDER — ONDANSETRON HCL 4 MG/2ML IJ SOLN
INTRAMUSCULAR | Status: AC
  Filled 2022-04-22: qty 2

## 2022-04-22 MED ORDER — LIDOCAINE HCL (PF) 2 % IJ SOLN
INTRAMUSCULAR | Status: AC
  Filled 2022-04-22: qty 5

## 2022-04-22 MED ORDER — NITROFURANTOIN MACROCRYSTAL 100 MG PO CAPS: 100.0000 mg | ORAL_CAPSULE | Freq: Every day | ORAL | 0 refills | Status: AC

## 2022-04-22 MED ORDER — OXYCODONE HCL 5 MG PO TABS: 5.0000 mg | ORAL_TABLET | Freq: Once | ORAL | Status: DC | PRN

## 2022-04-22 MED ORDER — PROPOFOL 10 MG/ML IV BOLUS
INTRAVENOUS | Status: DC | PRN
  Administered 2022-04-22: 150 mg via INTRAVENOUS

## 2022-04-22 MED ORDER — LIDOCAINE HCL (CARDIAC) PF 100 MG/5ML IV SOSY
PREFILLED_SYRINGE | INTRAVENOUS | Status: DC | PRN
  Administered 2022-04-22: 100 mg via INTRAVENOUS

## 2022-04-22 MED ORDER — OXYCODONE HCL 5 MG/5ML PO SOLN: 5.0000 mg | Freq: Once | ORAL | Status: DC | PRN

## 2022-04-22 MED ORDER — CHLORHEXIDINE GLUCONATE 0.12 % MT SOLN
OROMUCOSAL | Status: AC
  Administered 2022-04-22: 15 mL via OROMUCOSAL
  Filled 2022-04-22: qty 15

## 2022-04-22 MED ORDER — ONDANSETRON HCL 4 MG/2ML IJ SOLN
INTRAMUSCULAR | Status: DC | PRN
  Administered 2022-04-22: 4 mg via INTRAVENOUS

## 2022-04-22 MED ORDER — CIPROFLOXACIN IN D5W 400 MG/200ML IV SOLN
INTRAVENOUS | Status: AC
  Filled 2022-04-22: qty 200

## 2022-04-22 MED ORDER — EPHEDRINE 5 MG/ML INJ
INTRAVENOUS | Status: AC
  Filled 2022-04-22: qty 5

## 2022-04-22 SURGICAL SUPPLY — 31 items
ADH LQ OCL WTPRF AMP STRL LF (MISCELLANEOUS) ×1
ADHESIVE MASTISOL STRL (MISCELLANEOUS) IMPLANT
BAG DRAIN SIEMENS DORNER NS (MISCELLANEOUS) ×1 IMPLANT
BAG DRN NS LF (MISCELLANEOUS) ×1
BAG PRESSURE INF REUSE 3000 (BAG) ×1 IMPLANT
BRUSH SCRUB EZ 1% IODOPHOR (MISCELLANEOUS) ×1 IMPLANT
CATH URET FLEX-TIP 2 LUMEN 10F (CATHETERS) IMPLANT
CATH URETL OPEN 5X70 (CATHETERS) IMPLANT
CNTNR URN SCR LID CUP LEK RST (MISCELLANEOUS) IMPLANT
CONT SPEC 4OZ STRL OR WHT (MISCELLANEOUS)
DRAPE UTILITY 15X26 TOWEL STRL (DRAPES) ×1 IMPLANT
DRSG TEGADERM 2-3/8X2-3/4 SM (GAUZE/BANDAGES/DRESSINGS) IMPLANT
FIBER LASER MOSES 200 DFL (Laser) IMPLANT
GLOVE SURG UNDER POLY LF SZ7.5 (GLOVE) ×1 IMPLANT
GOWN STRL REUS W/ TWL LRG LVL3 (GOWN DISPOSABLE) ×1 IMPLANT
GOWN STRL REUS W/ TWL XL LVL3 (GOWN DISPOSABLE) ×1 IMPLANT
GOWN STRL REUS W/TWL LRG LVL3 (GOWN DISPOSABLE) ×1
GOWN STRL REUS W/TWL XL LVL3 (GOWN DISPOSABLE) ×1
GUIDEWIRE STR DUAL SENSOR (WIRE) ×1 IMPLANT
IV NS IRRIG 3000ML ARTHROMATIC (IV SOLUTION) ×1 IMPLANT
KIT TURNOVER CYSTO (KITS) ×1 IMPLANT
PACK CYSTO AR (MISCELLANEOUS) ×1 IMPLANT
SET CYSTO W/LG BORE CLAMP LF (SET/KITS/TRAYS/PACK) ×1 IMPLANT
SHEATH NAVIGATOR HD 12/14X36 (SHEATH) IMPLANT
STENT URET 6FRX24 CONTOUR (STENTS) IMPLANT
STENT URET 6FRX26 CONTOUR (STENTS) IMPLANT
STENT URET 6FRX30 CONTOUR (STENTS) IMPLANT
SURGILUBE 2OZ TUBE FLIPTOP (MISCELLANEOUS) ×1 IMPLANT
SYR 10ML LL (SYRINGE) ×1 IMPLANT
VALVE UROSEAL ADJ ENDO (VALVE) IMPLANT
WATER STERILE IRR 500ML POUR (IV SOLUTION) ×1 IMPLANT

## 2022-04-22 NOTE — Anesthesia Preprocedure Evaluation (Addendum)
Anesthesia Evaluation  Patient identified by MRN, date of birth, ID band Patient awake    Reviewed: Allergy & Precautions, H&P , NPO status , Patient's Chart, lab work & pertinent test results  History of Anesthesia Complications Negative for: history of anesthetic complications  Airway Mallampati: II  TM Distance: >3 FB Neck ROM: full    Dental  (+) Edentulous Lower, Edentulous Upper, Dental Advidsory Given   Pulmonary neg shortness of breath, neg sleep apnea, neg COPD, neg recent URI, former smoker          Cardiovascular hypertension, (-) angina + Peripheral Vascular Disease  (-) Past MI, (-) Cardiac Stents and (-) DOE (-) dysrhythmias (-) Valvular Problems/Murmurs     Neuro/Psych negative neurological ROS  negative psych ROS   GI/Hepatic negative GI ROS, Neg liver ROS,,,  Endo/Other  diabetes    Renal/GU Renal disease (kidney stones)     Musculoskeletal   Abdominal   Peds  Hematology negative hematology ROS (+)   Anesthesia Other Findings Obese  Past Medical History: No date: Arthritis No date: Diabetes mellitus without complication [name] No date: History of kidney stones     Comment:  h/o No date: Hyperlipidemia No date: Hypertension No date: Neuropathy  Past Surgical History: 08/10/2018: AMPUTATION TOE; Left     Comment:  Procedure: RAY LEFT;  Surgeon: Samara Deist, DPM;                Location: [facility] ORS;  Service: Podiatry;  Laterality:               Left; 10/26/2018: IRRIGATION AND DEBRIDEMENT FOOT; Left     Comment:  Procedure: IRRIGATION AND DEBRIDEMENT FOOT, LEFT;                Surgeon: Samara Deist, DPM;  Location: [facility] ORS;                Service: Podiatry;  Laterality: Left; 02/25/2016: LOWER EXTREMITY ANGIOGRAPHY; Right     Comment:  Procedure: Lower Extremity Angiography;  Surgeon: Algernon Huxley, MD;  Location: [place] [facility];  Service:               Cardiovascular;   Laterality: Right; 02/27/2017: LOWER EXTREMITY ANGIOGRAPHY; Right     Comment:  Procedure: LOWER EXTREMITY ANGIOGRAPHY;  Surgeon: Algernon Huxley, MD;  Location: [place] [facility];  Service:               Cardiovascular;  Laterality: Right; 08/06/2018: LOWER EXTREMITY ANGIOGRAPHY; Left     Comment:  Procedure: LOWER EXTREMITY ANGIOGRAPHY;  Surgeon: Algernon Huxley, MD;  Location: [place] [facility];  Service:               Cardiovascular;  Laterality: Left; 02/25/2016: LOWER EXTREMITY INTERVENTION     Comment:  Procedure: Lower Extremity Intervention;  Surgeon: Algernon Huxley, MD;  Location: [place] [facility];  Service:               Cardiovascular;; No date: TOE AMPUTATION  BMI    Body Mass Index: 36.52 kg/m      Reproductive/Obstetrics negative OB ROS  Anesthesia Physical Anesthesia Plan  ASA: 3  Anesthesia Plan: General   Post-op Pain Management:    Induction: Intravenous  PONV Risk Score and Plan: Ondansetron, Dexamethasone, Midazolam and Treatment may vary due to age or medical condition  Airway Management Planned: Oral ETT  Additional Equipment:   Intra-op Plan:   Post-operative Plan: Extubation in OR  Informed Consent: I have reviewed the patients History and Physical, chart, labs and discussed the procedure including the risks, benefits and alternatives for the proposed anesthesia with the patient or authorized representative who has indicated his/her understanding and acceptance.     Dental Advisory Given  Plan Discussed with: Anesthesiologist  Anesthesia Plan Comments: (Patient consented for risks of anesthesia including but not limited to:  - adverse reactions to medications - damage to eyes, teeth, lips or other oral mucosa - nerve damage due to positioning  - sore throat or hoarseness - Damage to heart, brain, nerves, lungs, other parts of body or loss of  life  Patient voiced understanding.)        Anesthesia Quick Evaluation

## 2022-04-22 NOTE — Discharge Instructions (Signed)

## 2022-04-22 NOTE — Anesthesia Procedure Notes (Signed)
Procedure Name: Intubation Date/Time: 04/22/2022 12:34 PM  Performed by: Natasha Mead, CRNAPre-anesthesia Checklist: Patient identified, Emergency Drugs available, Suction available and Patient being monitored Patient Re-evaluated:Patient Re-evaluated prior to induction Oxygen Delivery Method: Circle system utilized Preoxygenation: Pre-oxygenation with 100% oxygen Induction Type: IV induction Ventilation: Mask ventilation without difficulty Laryngoscope Size: McGraph and 4 Grade View: Grade I Tube type: Oral Tube size: 7.5 mm Number of attempts: 1 Airway Equipment and Method: Stylet and Oral airway Placement Confirmation: ETT inserted through vocal cords under direct vision, positive ETCO2 and breath sounds checked- equal and bilateral Secured at: 21 cm Tube secured with: Tape Dental Injury: Teeth and Oropharynx as per pre-operative assessment

## 2022-04-22 NOTE — H&P (Signed)
04/22/22 12:20 PM   SADAMU DISPENZA Jun 24, 1960 [id number]  CC: Right ureteral stone  HPI: 62 year old male who previously presented with a 1 cm right proximal ureteral stone, and at the time of attempted ureteroscopy was found to have frankly purulent urine in the right kidney and underwent stent placement.  Urine culture grew Staph aureus and he was treated with antibiotics.  Here today for definitive management with ureteroscopy.  He is tolerated the stent well.   PMH: Past Medical History:  Diagnosis Date   Anemia    Arthritis    Diabetes mellitus without complication ([place])    History of kidney stones    h/o   Hyperlipidemia    Hypertension    Neuromuscular disorder [facility])    Neuropathy     Surgical History: Past Surgical History:  Procedure Laterality Date   AMPUTATION TOE Left 08/10/2018   Procedure: RAY LEFT;  Surgeon: Samara Deist, DPM;  Location: [facility] ORS;  Service: Podiatry;  Laterality: Left;   ARTHRODESIS METATARSAL Left 02/01/2019   Procedure: ARTHRODESIS METATARSAL;  Surgeon: Samara Deist, DPM;  Location: [facility] ORS;  Service: Podiatry;  Laterality: Left;   CYSTOSCOPY/URETEROSCOPY/HOLMIUM LASER/STENT PLACEMENT Right 04/01/2022   Procedure: CYSTOSCOPY/URETEROSCOPY/HOLMIUM LASER/STENT PLACEMENT;  Surgeon: Billey Co, MD;  Location: [facility] ORS;  Service: Urology;  Laterality: Right;   FOOT ARTHRODESIS Left 02/01/2019   Procedure: ARTHRODESIS FOOT;STJ;  Surgeon: Samara Deist, DPM;  Location: [facility] ORS;  Service: Podiatry;  Laterality: Left;   IRRIGATION AND DEBRIDEMENT FOOT Left 10/26/2018   Procedure: IRRIGATION AND DEBRIDEMENT FOOT, LEFT;  Surgeon: Samara Deist, DPM;  Location: [facility] ORS;  Service: Podiatry;  Laterality: Left;   LOWER EXTREMITY ANGIOGRAPHY Right 02/25/2016   Procedure: Lower Extremity Angiography;  Surgeon: Algernon Huxley, MD;  Location: [place] [facility];  Service: Cardiovascular;  Laterality: Right;   LOWER EXTREMITY ANGIOGRAPHY Right  02/27/2017   Procedure: LOWER EXTREMITY ANGIOGRAPHY;  Surgeon: Algernon Huxley, MD;  Location: [place] [facility];  Service: Cardiovascular;  Laterality: Right;   LOWER EXTREMITY ANGIOGRAPHY Left 08/06/2018   Procedure: LOWER EXTREMITY ANGIOGRAPHY;  Surgeon: Algernon Huxley, MD;  Location: [place] [facility];  Service: Cardiovascular;  Laterality: Left;   LOWER EXTREMITY INTERVENTION  02/25/2016   Procedure: Lower Extremity Intervention;  Surgeon: Algernon Huxley, MD;  Location: [place] [facility];  Service: Cardiovascular;;   SHOULDER OPEN ROTATOR CUFF REPAIR Right    TOE AMPUTATION       Family History: Family History  Problem Relation Age of Onset   Diabetes Mother    Heart disease Father    Diabetes Sister    Diabetes Brother    Cancer Sister        lung   Cancer Sister        breast    Social History:  reports that he quit smoking about 3 years ago. His smoking use included cigarettes. He has a 17.50 pack-year smoking history. He has never used smokeless tobacco. He reports that he does not drink alcohol and does not use drugs.  Physical Exam: BP (!) 121/56   Pulse 64   Temp 99.3 F (37.4 C) (Oral)   Resp 20   SpO2 100%    Constitutional:  Alert and oriented, No acute distress. Cardiovascular: Regular rate and rhythm Respiratory: Clear to auscultation bilaterally GI: Abdomen is soft, nontender, nondistended, no abdominal masses   Laboratory Data: Urine culture with Staph aureus 04/01/2022 treated with culture appropriate antibiotics  Assessment & Plan:  62 year old male with 1 cm right proximal ureteral stone who previously underwent stent placement for infection, here today for definitive management with ureteroscopy.  We specifically discussed the risks ureteroscopy including bleeding, infection/sepsis, stent related symptoms including flank pain/urgency/frequency/incontinence/dysuria, ureteral injury, inability to access stone, or need for staged or additional  procedures.  Right ureteroscopy, laser lithotripsy, stent placement today  Nickolas Madrid, MD 04/22/2022  [facility] [address] [place], [place] [address] [phone]

## 2022-04-22 NOTE — Transfer of Care (Signed)
Immediate Anesthesia Transfer of Care Note  Patient: Joseph Singh  Procedure(s) Performed: CYSTOSCOPY/URETEROSCOPY/HOLMIUM LASER/STENT EXCHANGE (Right: Ureter)  Patient Location: PACU  Anesthesia Type:General  Level of Consciousness: drowsy and patient cooperative  Airway & Oxygen Therapy: Patient Spontanous Breathing  Post-op Assessment: Report given to RN and Post -op Vital signs reviewed and stable  Post vital signs: Reviewed and stable  Last Vitals:  Vitals Value Taken Time  BP 122/62 04/22/22 1315  Temp    Pulse    Resp 16 04/22/22 1315  SpO2 99 % 04/22/22 1315    Last Pain:  Vitals:   04/22/22 1039  TempSrc: Oral  PainSc: 0-No pain         Complications: No notable events documented.

## 2022-04-22 NOTE — Anesthesia Postprocedure Evaluation (Signed)
Anesthesia Post Note  Patient: Joseph Singh  Procedure(s) Performed: CYSTOSCOPY/URETEROSCOPY/HOLMIUM LASER/STENT EXCHANGE (Right: Ureter)  Patient location during evaluation: PACU Anesthesia Type: General Level of consciousness: awake and alert Pain management: pain level controlled Vital Signs Assessment: post-procedure vital signs reviewed and stable Respiratory status: spontaneous breathing, nonlabored ventilation, respiratory function stable and patient connected to nasal cannula oxygen Cardiovascular status: blood pressure returned to baseline and stable Postop Assessment: no apparent nausea or vomiting Anesthetic complications: no   No notable events documented.   Last Vitals:  Vitals:   04/22/22 1338 04/22/22 1347  BP:  123/64  Pulse: 68 68  Resp: 18 18  Temp: (!) 36.1 C 36.6 C  SpO2: 96% 98%    Last Pain:  Vitals:   04/22/22 1347  TempSrc: Temporal  PainSc: 0-No pain                 Ilene Qua

## 2022-04-22 NOTE — Op Note (Signed)
Date of procedure: 04/22/22  Preoperative diagnosis:  Right renal stone  Postoperative diagnosis:  Same  Procedure: Cystoscopy, right ureteroscopy, laser lithotripsy, right retrograde pyelogram with intraoperative interpretation, right ureteral stent placement  Surgeon: Nickolas Madrid, MD  Anesthesia: General  Complications: None  Intraoperative findings:  Uncomplicated dusting of right renal stone, stent placement  EBL: Minimal  Specimens: None  Drains: Right 6 French by 30 cm ureteral stent with Dangler  Indication: Joseph Singh is a 62 y.o. patient with 1 cm right proximal ureteral stone who previously underwent stent placement for infection, presents today for definitive management with ureteroscopy.  After reviewing the management options for treatment, they elected to proceed with the above surgical procedure(s). We have discussed the potential benefits and risks of the procedure, side effects of the proposed treatment, the likelihood of the patient achieving the goals of the procedure, and any potential problems that might occur during the procedure or recuperation. Informed consent has been obtained.  Description of procedure:  The patient was taken to the operating room and general anesthesia was induced. SCDs were placed for DVT prophylaxis. The patient was placed in the dorsal lithotomy position, prepped and draped in the usual sterile fashion, and preoperative antibiotics(Cipro) were administered. A preoperative time-out was performed.   A 21 French rigid cystoscope was used to intubate the urethra and a normal-appearing urethra was followed proximally into the bladder.  There were no suspicious lesions in the bladder.  A sensor wire was advanced alongside the right ureteral stent up to the kidney under fluoroscopic vision.  The right ureteral stent was grasped and removed.  A digital single-channel flexible ureteroscope was then advanced over the wire up into the kidney  under fluoroscopic vision.  Thorough pyeloscopy revealed the 1 cm stone had been pushed back into the lower pole.  A 200 m laser fiber on settings of 0.5 J and 80 Hz was used to methodically dust the stone.  Thorough pyeloscopy showed no other stone fragments larger than the size of the laser fiber.  A retrograde pyelogram was performed from the proximal ureter and showed no extravasation or filling defects.  Pullback ureteroscopy showed no ureteral injury or residual stones.  The rigid cystoscope was backloaded over the wire and a 6 [place] by 30 cm ureteral stent was uneventfully placed with a curl in the renal pelvis, as well as in the bladder.  The bladder was drained, and the Dangler was secured to the dorsal aspect of the penis.  Disposition: Stable to PACU  Plan: Okay to remove stent at home on Tuesday 3/5 Can follow-up with urology as needed  Nickolas Madrid, MD

## 2022-04-23 ENCOUNTER — Emergency Department
Admission: EM | Admit: 2022-04-23 | Discharge: 2022-04-23 | Disposition: A | Discharge status: Home / Self Care | Payer: 59 | Attending: Emergency Medicine | Admitting: Emergency Medicine

## 2022-04-23 ENCOUNTER — Encounter: Payer: Self-pay | Admitting: Urology

## 2022-04-23 ENCOUNTER — Other Ambulatory Visit: Payer: Self-pay

## 2022-04-23 DIAGNOSIS — Y732 Prosthetic and other implants, materials and accessory gastroenterology and urology devices associated with adverse incidents: Secondary | ICD-10-CM | POA: Diagnosis not present

## 2022-04-23 DIAGNOSIS — T83192A Other mechanical complication of urinary stent, initial encounter: Secondary | ICD-10-CM | POA: Diagnosis present

## 2022-04-23 DIAGNOSIS — T839XXA Unspecified complication of genitourinary prosthetic device, implant and graft, initial encounter: Secondary | ICD-10-CM

## 2022-04-23 LAB — URINALYSIS, ROUTINE W REFLEX MICROSCOPIC
Bilirubin Urine: NEGATIVE
Glucose, UA: >=500 mg/dL — AB
Ketones, ur: NEGATIVE mg/dL
Leukocytes,Ua: NEGATIVE
Nitrite: POSITIVE — AB
Protein, ur: 30 mg/dL — AB
Specific Gravity, Urine: 1.022 (ref 1.005–1.030)
Squamous Epithelial / HPF: NONE SEEN /HPF (ref 0–5)
pH: 5 (ref 5.0–8.0)

## 2022-04-23 MED ORDER — KETOROLAC TROMETHAMINE 30 MG/ML IJ SOLN
15.0000 mg | Freq: Once | INTRAMUSCULAR | Status: AC
  Administered 2022-04-23: 15 mg via INTRAMUSCULAR
  Filled 2022-04-23: qty 1

## 2022-04-23 MED ORDER — KETOROLAC TROMETHAMINE 10 MG PO TABS: 10.0000 mg | ORAL_TABLET | Freq: Four times a day (QID) | ORAL | 0 refills | Status: DC | PRN

## 2022-04-23 NOTE — ED Triage Notes (Signed)
Pt to ED with wife, had lithotripsy yesterday and stent placed with strong that comes out of urethra, then pt accidentally pulled strong farther out by accident. Has been urinating frequently since then. No pain. On azo.   Pt concerned b/c not sure if string is out too far and because is urinating frequently and leaking urine. Wearing brief.

## 2022-04-23 NOTE — ED Provider Notes (Signed)
[facility] Provider Note    Event Date/Time   First MD Initiated Contact with Patient 04/23/22 1300     (approximate)   History   Dysuria   HPI  Joseph Singh is a 62 y.o. male with history of kidney stones and lithotripsy/stent placed yesterday.  Patient states that the stent has a string on it.  Went to use the bathroom last night and the tape got caught on the edge of the urinal.  States once it was pulled out he accidentally pulled it a little bit further out.  States now he has a continuous dribble and has filled up 1 depends per hour of urine.  Denies any pain.  States he already passed all the blood clots and he thinks the gravel.  States the urine is orange but he is on Azo.  Denies fever or chills.      Physical Exam   Triage Vital Signs: ED Triage Vitals  Enc Vitals Group     BP 04/23/22 1234 131/63     Pulse Rate 04/23/22 1234 71     Resp 04/23/22 1234 18     Temp 04/23/22 1234 98.5 F (36.9 C)     Temp Source 04/23/22 1234 Oral     SpO2 04/23/22 1234 95 %     Weight 04/23/22 1238 195 lb (88.5 kg)     Height 04/23/22 1238 '6\' 4"'$  (1.93 m)     Head Circumference --      Peak Flow --      Pain Score 04/23/22 1238 0     Pain Loc --      Pain Edu? --      Excl. in [place]? --     Most recent vital signs: Vitals:   04/23/22 1234 04/23/22 1540  BP: 131/63 (!) 103/58  Pulse: 71 67  Resp: 18 18  Temp: 98.5 F (36.9 C) 99.1 F (37.3 C)  SpO2: 95% 96%     General: Awake, no distress.   CV:  Good peripheral perfusion. regular rate and  rhythm Resp:  Normal effort.  Abd:  No distention.   Other:      ED Results / Procedures / Treatments   Labs (all labs ordered are listed, but only abnormal results are displayed) Labs Reviewed  URINALYSIS, ROUTINE W REFLEX MICROSCOPIC - Abnormal; Notable for the following components:      Result Value   Color, Urine AMBER (*)    APPearance CLEAR (*)    Glucose, UA >=500 (*)    Hgb urine  dipstick MODERATE (*)    Protein, ur 30 (*)    Nitrite POSITIVE (*)    Bacteria, UA RARE (*)    All other components within normal limits     EKG     RADIOLOGY     PROCEDURES:   Procedures   MEDICATIONS ORDERED IN ED: Medications  ketorolac (TORADOL) 30 MG/ML injection 15 mg (15 mg Intramuscular Given 04/23/22 1446)     IMPRESSION / MDM / ASSESSMENT AND PLAN / ED COURSE  I reviewed the triage vital signs and the nursing notes.                              Differential diagnosis includes, but is not limited to, kidney stone, stent removal, stent care  Patient's presentation is most consistent with acute complicated illness / injury requiring diagnostic workup.   Consult urology,  Dr. Claudia Desanctis states that if he is not having any pain and is otherwise feeling better go ahead and pull the stent.  Discharge  him  on NSAIDs.  Urinalysis shows moderate amount of hemoglobin which would be typical of his recent lithotripsy  Will give him an injection of Toradol 15 mg IM followed by removal of the stent.   Patient was able to remove the stent without difficulty.  No bleeding.  Is able to urinate.  He was given a prescription for Toradol 10 mg p.o. as a prescription.  Use this as needed.  Follow-up with Dr. Jeb Levering.  Patient is in agreement.  Discharged stable condition.   FINAL CLINICAL IMPRESSION(S) / ED DIAGNOSES   Final diagnoses:  Complication of urinary stent, initial encounter ([place])     Rx / DC Orders   ED Discharge Orders          Ordered    ketorolac (TORADOL) 10 MG tablet  Every 6 hours PRN        04/23/22 1543             Note:  This document was prepared using Dragon voice recognition software and may include unintentional dictation errors.    Versie Starks, PA-C 04/23/22 1620    Naaman Plummer, MD 04/23/22 [phone]

## 2022-04-25 ENCOUNTER — Telehealth: Payer: Self-pay | Admitting: Urology

## 2022-04-25 ENCOUNTER — Telehealth: Payer: Self-pay

## 2022-04-25 NOTE — Telephone Encounter (Signed)
Patient's wife called and stated that patient had surgery on Friday and had some stent issues over the weekend and was seen in ER. She requests call to discuss issue, and if patient needs to be seen in office. [phone]

## 2022-04-25 NOTE — Telephone Encounter (Signed)
Called pt he denies any complaints. He is able to void with no issues, denies fever, chills, n/v, or blood in urine. He states he was calling to make sure he did not need a f/u appt, advised pt no f/u needed per Op note from [place]. Pt voiced understanding.

## 2022-04-25 NOTE — Transitions of Care (Post Inpatient/ED Visit) (Signed)
   04/25/2022  Name: ATIF ZAPPONE MRN: [id number] DOB: Jul 09, 1960  Today's TOC FU Call Status: TOC FU Call Complete Date: 04/25/22  Transition Care Management Follow-up Telephone Call Date of Discharge: 04/23/22 Discharge Facility: [facility]) Type of Discharge: Emergency Department Reason for ED Visit: Other: (String on urinary stent got caught and was moved.  Pt could not stop his urine flow.) How have you been since you were released from the hospital?: Better Any questions or concerns?: No  Items Reviewed: Did you receive and understand the discharge instructions provided?: Yes Medications obtained and verified?: Yes (Medications Reviewed) Any new allergies since your discharge?: No Dietary orders reviewed?: NA Do you have support at home?: Yes People in Home: spouse Name of Support/Comfort Primary Source: [facility] and Equipment/Supplies: [place] Ordered?: No Any new equipment or medical supplies ordered?: No  Functional Questionnaire: Do you need assistance with bathing/showering or dressing?: No Do you need assistance with meal preparation?: No Do you need assistance with eating?: No Do you have difficulty maintaining continence: No Do you need assistance with getting out of bed/getting out of a chair/moving?: No Do you have difficulty managing or taking your medications?: No  Folllow up appointments reviewed: PCP Follow-up appointment confirmed?: NA (Pt declined appointment with PCP. Will call office an schedule an appointment if needed.) [facility] Follow-up appointment confirmed?: No (Pt has called urologist office to schedule an appointment.  Had to leave a message and have not heard back yet.) Reason Specialist Follow-Up Not Confirmed: Patient has Specialist Provider Number and will Call for Appointment Do you need transportation to your follow-up appointment?: No Do you understand care options if  your condition(s) worsen?: Yes-patient verbalized understanding    SIGNATURE Ferne Reus, RN

## 2022-04-25 NOTE — Telephone Encounter (Signed)
Reviewed

## 2022-04-27 ENCOUNTER — Encounter: Payer: Self-pay | Admitting: Pharmacist

## 2022-04-27 ENCOUNTER — Other Ambulatory Visit: Payer: Self-pay | Admitting: Pharmacist

## 2022-04-27 MED ORDER — TIRZEPATIDE 2.5 MG/0.5ML ~~LOC~~ SOAJ: 2.5000 mg | SUBCUTANEOUS | 2 refills | Status: DC

## 2022-04-27 MED ORDER — BLOOD GLUCOSE MONITOR KIT: PACK | 0 refills | Status: DC

## 2022-04-27 NOTE — Addendum Note (Signed)
Addended by: Kaylyn Layer T on: 04/27/2022 11:35 AM   Modules accepted: Orders

## 2022-04-27 NOTE — Patient Instructions (Signed)
Check your blood sugars a few times a week, alternating between: 1) Fasting, first thing in the morning before breakfast and  2) 2 hours after your largest meal.   For a goal A1c of less than 7%, goal fasting readings are less than 130 and goal 2 hour after meal readings are less than 180.    Take care!  Catie Hedwig Morton, PharmD, [place], [facility] [phone]

## 2022-04-27 NOTE — Progress Notes (Signed)
Care Coordination Call  Contacted patient. PA for CGM was denied as patient is not on insulin. Patient requests a script for a new glucometer, strips, and lancets. I have ordered this. Requested to check glucose a few times weekly at least, alternating between fasting and 2 hour post prandial.   Notes he took his first shot of Mounjaro on Monday. Denies any abdominal pain.   Denies any urinary issues since he was seen in the ED on Saturday and stent was removed.   Follow up call in 4 weeks.   Catie Hedwig Morton, PharmD, [place], [facility] [phone]

## 2022-05-02 ENCOUNTER — Encounter: Payer: Self-pay | Admitting: Pharmacist

## 2022-05-02 ENCOUNTER — Telehealth: Payer: Self-pay | Admitting: Pharmacist

## 2022-05-02 DIAGNOSIS — E1165 Type 2 diabetes mellitus with hyperglycemia: Secondary | ICD-10-CM

## 2022-05-02 MED ORDER — LANCET DEVICE MISC: 1.0000 | Freq: Every day | 0 refills | Status: DC

## 2022-05-02 MED ORDER — BLOOD GLUCOSE MONITORING SUPPL DEVI: 1.0000 | Freq: Every day | 0 refills | Status: DC

## 2022-05-02 MED ORDER — BLOOD GLUCOSE TEST VI STRP: 1.0000 | ORAL_STRIP | Freq: Every day | 1 refills | Status: DC

## 2022-05-02 MED ORDER — LANCETS MISC. MISC: 1.0000 | Freq: Every day | 0 refills | Status: DC

## 2022-05-02 NOTE — Progress Notes (Signed)
   05/02/2022  Patient ID: Joseph Singh, male   DOB: Aug 01, 1960, 62 y.o.   MRN: [id number]  While covering for Catie Jodi Mourning, receive a message from patient requesting prescriptions for diabetes testing supplies for patient. Speak with patient who advises he does not currently have a blood sugar meter. Will collaborate with PCP to request prescriptions for glucometer and testing supplies be called into pharmacy for patient.  Wallace Cullens, PharmD, [facility] [phone]

## 2022-05-02 NOTE — Telephone Encounter (Signed)
Sent to pharmacy 

## 2022-05-03 ENCOUNTER — Other Ambulatory Visit: Payer: Self-pay | Admitting: Family Medicine

## 2022-05-03 DIAGNOSIS — G629 Polyneuropathy, unspecified: Secondary | ICD-10-CM

## 2022-05-07 ENCOUNTER — Other Ambulatory Visit: Payer: Self-pay | Admitting: Family Medicine

## 2022-05-07 DIAGNOSIS — E1142 Type 2 diabetes mellitus with diabetic polyneuropathy: Secondary | ICD-10-CM

## 2022-05-10 ENCOUNTER — Ambulatory Visit (INDEPENDENT_AMBULATORY_CARE_PROVIDER_SITE_OTHER): Payer: 59 | Admitting: Physician Assistant

## 2022-05-10 DIAGNOSIS — R300 Dysuria: Secondary | ICD-10-CM

## 2022-05-10 LAB — MICROSCOPIC EXAMINATION: WBC, UA: >30 /hpf — AB (ref 0–5)

## 2022-05-10 LAB — URINALYSIS, COMPLETE
Bilirubin, UA: NEGATIVE
Ketones, UA: NEGATIVE
Nitrite, UA: POSITIVE — AB
Specific Gravity, UA: 1.01 (ref 1.005–1.030)
Urobilinogen, Ur: 0.2 mg/dL (ref 0.2–1.0)
pH, UA: 5.5 (ref 5.0–7.5)

## 2022-05-10 LAB — BLADDER SCAN AMB NON-IMAGING: Scan Result: 26

## 2022-05-10 MED ORDER — SULFAMETHOXAZOLE-TRIMETHOPRIM 800-160 MG PO TABS: 1.0000 | ORAL_TABLET | Freq: Two times a day (BID) | ORAL | 0 refills | Status: AC

## 2022-05-10 NOTE — Progress Notes (Signed)
05/10/2022 2:17 PM   Joseph Singh 10-22-1960 [id number]  CC: Chief Complaint  Patient presents with   Follow-up    HPI: Joseph Singh is a 62 y.o. male with PMH diabetes on Jardiance who recently underwent right ureteroscopy with Dr. Diamantina Providence for management of an 11 mm right UPJ stone who presents today for evaluation of possible UTI.   Today he reports an approximate 4-day history of dysuria, urgency, frequency, cloudy urine, and urge incontinence.  He has been taking Azo, most recently yesterday morning, and it has been helping.  He denies a history of prior UTIs.  In-office UA today positive for 3+ glucose, 1+ blood, 1+ protein, nitrates, and 1+ leukocytes; urine microscopy with >30 WBCs/HPF, 3-10 RBCs/HPF, and moderate bacteria. PVR 38mL.  PMH: Past Medical History:  Diagnosis Date   Anemia    Arthritis    Diabetes mellitus without complication ([place])    History of kidney stones    h/o   Hyperlipidemia    Hypertension    Neuromuscular disorder [facility])    Neuropathy     Surgical History: Past Surgical History:  Procedure Laterality Date   AMPUTATION TOE Left 08/10/2018   Procedure: RAY LEFT;  Surgeon: Samara Deist, DPM;  Location: [facility] ORS;  Service: Podiatry;  Laterality: Left;   ARTHRODESIS METATARSAL Left 02/01/2019   Procedure: ARTHRODESIS METATARSAL;  Surgeon: Samara Deist, DPM;  Location: [facility] OR[facility];  Service: Podiatry;  Laterality: Left;   CYSTOSCOPY/URETEROSCOPY/HOLMIUM LASER/STENT PLACEMENT Right 04/01/2022   Procedure: CYSTOSCOPY/URETEROSCOPY/HOLMIUM LASER/STENT PLACEMENT;  Surgeon: Billey Co, MD;  Location: [facility] ORS;  Service: Urology;  Laterality: Right;   CYSTOSCOPY/URETEROSCOPY/HOLMIUM LASER/STENT PLACEMENT Right 04/22/2022   Procedure: CYSTOSCOPY/URETEROSCOPY/HOLMIUM LASER/STENT EXCHANGE;  Surgeon: Billey Co, MD;  Location: [facility] OR[facility];  Service: Urology;  Laterality: Right;   FOOT ARTHRODESIS Left 02/01/2019   Procedure: ARTHRODESIS  FOOT;STJ;  Surgeon: Samara Deist, DPM;  Location: [facility] OR[facility];  Service: Podiatry;  Laterality: Left;   IRRIGATION AND DEBRIDEMENT FOOT Left 10/26/2018   Procedure: IRRIGATION AND DEBRIDEMENT FOOT, LEFT;  Surgeon: Samara Deist, DPM;  Location: [facility];  Service: Podiatry;  Laterality: Left;   LOWER EXTREMITY ANGIOGRAPHY Right 02/25/2016   Procedure: Lower Extremity Angiography;  Surgeon: Algernon Huxley, MD;  Location: [place] [facility];  Service: Cardiovascular;  Laterality: Right;   LOWER EXTREMITY ANGIOGRAPHY Right 02/27/2017   Procedure: LOWER EXTREMITY ANGIOGRAPHY;  Surgeon: Algernon Huxley, MD;  Location: [place] [facility];  Service: Cardiovascular;  Laterality: Right;   LOWER EXTREMITY ANGIOGRAPHY Left 08/06/2018   Procedure: LOWER EXTREMITY ANGIOGRAPHY;  Surgeon: Algernon Huxley, MD;  Location: [place] [facility];  Service: Cardiovascular;  Laterality: Left;   LOWER EXTREMITY INTERVENTION  02/25/2016   Procedure: Lower Extremity Intervention;  Surgeon: Algernon Huxley, MD;  Location: [place] [facility];  Service: Cardiovascular;;   SHOULDER OPEN ROTATOR CUFF REPAIR Right    TOE AMPUTATION      Home Medications:  Allergies as of 05/10/2022   No Known Allergies      Medication List        Accurate as of May 10, 2022  2:17 PM. If you have any questions, ask your nurse or doctor.          amLODipine 10 MG tablet Commonly known as: NORVASC TAKE 1 TABLET BY MOUTH DAILY   aspirin EC 81 MG tablet Take 1 tablet (81 mg total) by mouth daily.   atorvastatin 80 MG tablet Commonly known as: LIPITOR TAKE ONE  TABLET BY MOUTH EVERY DAY   blood glucose meter kit and supplies Kit E11.51, check 2x daily, dispense based on patient and insurance preference   Blood Glucose Monitoring Suppl Devi 1 each by Does not apply route daily. May substitute to any manufacturer covered by patient's insurance.   BLOOD GLUCOSE TEST STRIPS Strp 1 each by In Vitro route daily before breakfast. May  substitute to any manufacturer covered by patient's insurance.   calcium carbonate 750 MG chewable tablet Commonly known as: TUMS EX Chew 2 tablets by mouth as needed for heartburn.   clopidogrel 75 MG tablet Commonly known as: PLAVIX TAKE ONE TABLET BY MOUTH EVERY DAY   cyanocobalamin 1000 MCG tablet Commonly known as: VITAMIN B12 Take 1 tablet (1,000 mcg total) by mouth daily.   gabapentin 300 MG capsule Commonly known as: NEURONTIN TAKE 1 CAPSULE BY MOUTH FOUR TIMES DAILY   glipiZIDE 10 MG 24 hr tablet Commonly known as: GLUCOTROL XL TAKE ONE TABLET BY MOUTH EACH DAY WITH BREAKFAST   iron polysaccharides 150 MG capsule Commonly known as: NIFEREX Take 1 capsule (150 mg total) by mouth daily.   Jardiance 25 MG Tabs tablet Generic drug: empagliflozin TAKE 1 TABLET BY MOUTH ONCE DAILY   ketorolac 10 MG tablet Commonly known as: TORADOL Take 1 tablet (10 mg total) by mouth every 6 (six) hours as needed.   Lancet Device Misc 1 each by Does not apply route daily before breakfast. May substitute to any manufacturer covered by patient's insurance.   Lancets Misc. Misc 1 each by Does not apply route daily before breakfast. May substitute to any manufacturer covered by patient's insurance.   lisinopril-hydrochlorothiazide 10-12.5 MG tablet Commonly known as: ZESTORETIC TAKE 1 TABLET BY MOUTH DAILY   metFORMIN 1000 MG tablet Commonly known as: GLUCOPHAGE TAKE ONE TABLET BY MOUTH TWICE DAILY WITH A MEAL   metoprolol succinate 50 MG 24 hr tablet Commonly known as: TOPROL-XL TAKE ONE TABLET BY MOUTH ONCE DAILY WITHOR IMMEDIATELY FOLLOWING A MEAL   multivitamin tablet Take 1 tablet by mouth daily.   sulfamethoxazole-trimethoprim 800-160 MG tablet Commonly known as: BACTRIM DS Take 1 tablet by mouth 2 (two) times daily for 7 days.   tirzepatide 2.5 MG/0.5ML Pen Commonly known as: MOUNJARO Inject 2.5 mg into the skin once a week.   traMADol 50 MG tablet Commonly known  as: ULTRAM TAKE 1 TABLET BY MOUTH EVERY 8 HOURS AS NEEDED FOR MODERATE PAIN   Vitamin D 125 MCG (5000 UT) Caps Take 1 capsule by mouth daily.        Allergies:  No Known Allergies  Family History: Family History  Problem Relation Age of Onset   Diabetes Mother    Heart disease Father    Diabetes Sister    Diabetes Brother    Cancer Sister        lung   Cancer Sister        breast    Social History:   reports that he quit smoking about 3 years ago. His smoking use included cigarettes. He has a 17.50 pack-year smoking history. He has never used smokeless tobacco. He reports that he does not drink alcohol and does not use drugs.  Physical Exam: There were no vitals taken for this visit.  Constitutional:  Alert and oriented, no acute distress, nontoxic appearing HEENT: [place], AT Cardiovascular: No clubbing, cyanosis, or edema Respiratory: Normal respiratory effort, no increased work of breathing Skin: No rashes, bruises or suspicious lesions Neurologic: Grossly intact,  no focal deficits, moving all 4 extremities Psychiatric: Normal mood and affect  Laboratory Data: Results for orders placed or performed in visit on 05/10/22  Microscopic Examination   Urine  Result Value Ref Range   WBC, UA >30 (A) 0 - 5 /hpf   RBC, Urine 3-10 (A) 0 - 2 /hpf   Epithelial Cells (non renal) 0-10 0 - 10 /hpf   Bacteria, UA Moderate (A) None seen/Few  Urinalysis, Complete  Result Value Ref Range   Specific Gravity, UA 1.010 1.005 - 1.030   pH, UA 5.5 5.0 - 7.5   Color, UA Yellow Yellow   Appearance Ur Cloudy (A) Clear   Leukocytes,UA 1+ (A) Negative   Protein,UA 1+ (A) Negative/Trace   Glucose, UA 3+ (A) Negative   Ketones, UA Negative Negative   RBC, UA 1+ (A) Negative   Bilirubin, UA Negative Negative   Urobilinogen, Ur 0.2 0.2 - 1.0 mg/dL   Nitrite, UA Positive (A) Negative   Microscopic Examination See below:   BLADDER SCAN AMB NON-IMAGING  Result Value Ref Range   Scan Result  26 ml    Assessment & Plan:   1. Dysuria UA appears grossly infected, will start empiric Bactrim and send for culture for further evaluation.  We discussed that Vania Rea is likely playing a role with 3+ glucose noted on UA today.  Will continue to monitor. - Urinalysis, Complete - BLADDER SCAN AMB NON-IMAGING - CULTURE, URINE COMPREHENSIVE - sulfamethoxazole-trimethoprim (BACTRIM DS) 800-160 MG tablet; Take 1 tablet by mouth 2 (two) times daily for 7 days.  Dispense: 14 tablet; Refill: 0  Return if symptoms worsen or fail to improve.  Debroah Loop, PA-C  [facility] [address] [place], [place] [address] [phone]

## 2022-05-12 MED ORDER — LANCET DEVICE MISC: 1 refills | Status: DC

## 2022-05-12 MED ORDER — BLOOD GLUCOSE TEST VI STRP: ORAL_STRIP | 3 refills | Status: DC

## 2022-05-12 MED ORDER — LANCETS MISC. MISC: 3 refills | Status: DC

## 2022-05-12 MED ORDER — BLOOD GLUCOSE MONITORING SUPPL DEVI: 0 refills | Status: DC

## 2022-05-16 LAB — CULTURE, URINE COMPREHENSIVE

## 2022-05-23 ENCOUNTER — Other Ambulatory Visit: Payer: 59 | Admitting: Pharmacist

## 2022-05-23 NOTE — Progress Notes (Signed)
05/23/2022 Name: Joseph Singh MRN: [id number] DOB: 1960/04/27  Chief Complaint  Patient presents with   Medication Management   Diabetes   Hypertension   Hyperlipidemia    Joseph Singh is a 62 y.o. year old male who presented for a telephone visit.   They were referred to the pharmacist by their PCP for assistance in managing diabetes, hypertension, and hyperlipidemia.    Subjective:  Care Team: Primary Care Provider: Leone Haven, MD ; Next Scheduled Visit: 06/13/22  Medication Access/Adherence  Current Pharmacy:  [place], [place] - [address] [place] [address] Phone: [phone] Fax: [phone]  [facility] [id number] [place], [place] - [address] [place] [address] Phone: [phone] Fax: [phone]   Patient reports affordability concerns with their medications: No  Patient reports access/transportation concerns to their pharmacy: No  Patient reports adherence concerns with their medications:  No     Diabetes:  Current medications: Mounjaro 2.5 mg weekly, glipizide XL 10 mg daily, metformin 1000 mg twice daily, Jardiance 25 mg daily  Denies any stomach upset, constipation, nausea since starting Moujaro.   Current glucose readings: has not been checking. Does report some "dizziness", but unclear if related to glucose.    Hypertension:   Current medications: amlodipine 10 mg daily, lisinopril/HCTZ 10/12.5 mg daily, metoprolol succinate 50 mg daily    Hyperlipidemia/ASCVD Risk Reduction   Current lipid lowering medications: atorvastatin 80 mg daily   Antiplatelet regimen: aspirin 81 mg daily   Objective:  Lab Results  Component Value Date   HGBA1C 8.3 (A) 03/09/2022    Lab Results  Component Value Date   CREATININE 1.11 04/08/2022   BUN 15 04/08/2022   NA 136 04/08/2022   K 4.4 04/08/2022   CL 100 04/08/2022    CO2 27 04/08/2022    Lab Results  Component Value Date   CHOL 121 10/06/2021   HDL 33.90 (L) 10/06/2021   LDLCALC 76 10/02/2020   LDLDIRECT 58.0 10/06/2021   TRIG 278.0 (H) 10/06/2021   CHOLHDL 4 10/06/2021    Medications Reviewed Today     Reviewed by Osker Mason, RPH-CPP (Pharmacist) on 05/23/22 at 42  Med List Status: <None>   Medication Order Taking? Sig Documenting Provider Last Dose Status Informant  amLODipine (NORVASC) 10 MG tablet [id number] Yes TAKE 1 TABLET BY MOUTH DAILY Leone Haven, MD Taking Active Self, Pharmacy Records  aspirin EC 81 MG tablet [id number] Yes Take 1 tablet (81 mg total) by mouth daily. Algernon Huxley, MD Taking Active Self, Pharmacy Records  atorvastatin (LIPITOR) 80 MG tablet [id number] Yes TAKE ONE TABLET BY MOUTH EVERY DAY Leone Haven, MD Taking Active Self, Pharmacy Records  Blood Glucose Monitoring Suppl DEVI [id number]  Use to check glucose as directed. May substitute to any manufacturer covered by patient's insurance. Leone Haven, MD  Active   Cholecalciferol (VITAMIN D) 125 MCG (5000 UT) CAPS [id number] Yes Take 1 capsule by mouth daily. Val Riles, MD Taking Active   clopidogrel (PLAVIX) 75 MG tablet [id number] Yes TAKE ONE TABLET BY MOUTH EVERY DAY Leone Haven, MD Taking Active Self, Pharmacy Records  cyanocobalamin (VITAMIN B12) 1000 MCG tablet [id number] Yes Take 1 tablet (1,000 mcg total) by mouth daily. Val Riles, MD Taking Active   gabapentin (NEURONTIN) 300 MG capsule [id number] Yes TAKE 1 CAPSULE BY MOUTH FOUR  TIMES DAILY Leone Haven, MD Taking Active   glipiZIDE (GLUCOTROL XL) 10 MG 24 hr tablet [id number] Yes TAKE ONE TABLET BY MOUTH EACH DAY WITH BREAKFAST Leone Haven, MD Taking Active Self, Pharmacy Records  Glucose Blood (BLOOD GLUCOSE TEST STRIPS) STRP [id number] Yes Use to check glucose twice daily. May substitute to any manufacturer covered by patient's insurance. Leone Haven, MD  Taking Active   iron polysaccharides (NIFEREX) 150 MG capsule [id number] Yes Take 1 capsule (150 mg total) by mouth daily. Leone Haven, MD Taking Active   JARDIANCE 25 MG TABS tablet [id number] Yes TAKE 1 TABLET BY MOUTH ONCE DAILY Leone Haven, MD Taking Active Self, Pharmacy Records  Lancet Device [place] [id number] Yes Use to check glucose twice daily. May substitute to any manufacturer covered by patient's insurance. Leone Haven, MD Taking Active   Lancets Misc. [place] [id number] Yes Use to check glucose twice dialy. May substitute to any manufacturer covered by patient's insurance. Leone Haven, MD Taking Active   lisinopril-hydrochlorothiazide (ZESTORETIC) 10-12.5 MG tablet [id number] Yes TAKE 1 TABLET BY MOUTH DAILY Leone Haven, MD Taking Active Self, Pharmacy Records  metFORMIN (GLUCOPHAGE) 1000 MG tablet [id number] Yes TAKE ONE TABLET BY MOUTH TWICE DAILY WITH A MEAL Kennyth Arnold, FNP Taking Active Self, Pharmacy Records  metoprolol succinate (TOPROL-XL) 50 MG 24 hr tablet [id number] Yes TAKE ONE TABLET BY MOUTH ONCE DAILY WITHOR IMMEDIATELY FOLLOWING A MEAL Leone Haven, MD Taking Active Self, Pharmacy Records  Multiple Vitamin (MULTIVITAMIN) tablet [id number] Yes Take 1 tablet by mouth daily. [provider] Taking Active   tirzepatide [facility]) 2.5 MG/0.5ML Pen [id number] Yes Inject 2.5 mg into the skin once a week. Leone Haven, MD Taking Active   traMADol Veatrice [facility]) 50 MG tablet [id number] Yes TAKE 1 TABLET BY MOUTH EVERY 8 HOURS AS NEEDED FOR MODERATE PAIN Leone Haven, MD Taking Active               Assessment/Plan:   Diabetes: - Currently unknown control - Reviewed long term cardiovascular and renal outcomes of uncontrolled blood sugar - Reviewed goal A1c, goal fasting, and goal 2 hour post prandial glucose - Recommend to check fasting and post prandial readings this week, document, and let me know via MyChart. Anticipate  increasing dose of Mounjaro, likely decrease glipizide to 5 mg daily, but patient requests to wait and see glucose readings before making adjustments.    Hypertension: - Currently controlled - Recommend to continue current regimen at this time   Hyperlipidemia/ASCVD Risk Reduction: - Currently controlled.  - Recommend to continue current regimen at this time   Follow Up Plan: communication via [facility] in 1 week  Catie TJodi Mourning, PharmD, [place], [facility] [phone]

## 2022-06-03 ENCOUNTER — Other Ambulatory Visit: Payer: Self-pay | Admitting: Family Medicine

## 2022-06-03 DIAGNOSIS — I10 Essential (primary) hypertension: Secondary | ICD-10-CM

## 2022-06-09 ENCOUNTER — Other Ambulatory Visit: Payer: Self-pay | Admitting: Family Medicine

## 2022-06-09 DIAGNOSIS — E1142 Type 2 diabetes mellitus with diabetic polyneuropathy: Secondary | ICD-10-CM

## 2022-06-13 ENCOUNTER — Ambulatory Visit (INDEPENDENT_AMBULATORY_CARE_PROVIDER_SITE_OTHER): Payer: 59 | Admitting: Family Medicine

## 2022-06-13 VITALS — BP 124/70 | HR 69 | Temp 98.1°F | Ht 76.0 in | Wt 300.0 lb

## 2022-06-13 DIAGNOSIS — I10 Essential (primary) hypertension: Secondary | ICD-10-CM | POA: Diagnosis not present

## 2022-06-13 DIAGNOSIS — Z89432 Acquired absence of left foot: Secondary | ICD-10-CM

## 2022-06-13 DIAGNOSIS — E1165 Type 2 diabetes mellitus with hyperglycemia: Secondary | ICD-10-CM

## 2022-06-13 DIAGNOSIS — N200 Calculus of kidney: Secondary | ICD-10-CM

## 2022-06-13 DIAGNOSIS — E785 Hyperlipidemia, unspecified: Secondary | ICD-10-CM

## 2022-06-13 LAB — COMPREHENSIVE METABOLIC PANEL
ALT: 13 U/L (ref 0–53)
AST: 13 U/L (ref 0–37)
Albumin: 3.8 g/dL (ref 3.5–5.2)
Alkaline Phosphatase: 88 U/L (ref 39–117)
BUN: 13 mg/dL (ref 6–23)
CO2: 29 mEq/L (ref 19–32)
Calcium: 9.3 mg/dL (ref 8.4–10.5)
Chloride: 99 mEq/L (ref 96–112)
Creatinine, Ser: 0.93 mg/dL (ref 0.40–1.50)
GFR: 87.5 mL/min (ref >60.00)
Glucose, Bld: 190 mg/dL — ABNORMAL HIGH (ref 70–99)
Potassium: 3.9 mEq/L (ref 3.5–5.1)
Sodium: 137 mEq/L (ref 135–145)
Total Bilirubin: 0.5 mg/dL (ref 0.2–1.2)
Total Protein: 6.9 g/dL (ref 6.0–8.3)

## 2022-06-13 LAB — HEMOGLOBIN A1C: Hgb A1c MFr Bld: 6.7 % — ABNORMAL HIGH (ref 4.6–6.5)

## 2022-06-13 MED ORDER — AMLODIPINE BESYLATE 10 MG PO TABS: 10.0000 mg | ORAL_TABLET | Freq: Every day | ORAL | 3 refills | Status: DC

## 2022-06-13 MED ORDER — ATORVASTATIN CALCIUM 80 MG PO TABS: 80.0000 mg | ORAL_TABLET | Freq: Every day | ORAL | 3 refills | Status: DC

## 2022-06-13 NOTE — Assessment & Plan Note (Signed)
Resolved.  Patient will monitor for any recurrent symptoms.

## 2022-06-13 NOTE — Patient Instructions (Signed)
Nice to see you. Please consider doing the colon cancer screening kit that your insurance provided.

## 2022-06-13 NOTE — Assessment & Plan Note (Signed)
Chronic issue.  Adequately controlled.  Patient will continue amlodipine 10 mg daily, lisinopril-HCTZ 1 tablet daily, and metoprolol 50 mg daily.

## 2022-06-13 NOTE — Assessment & Plan Note (Signed)
Chronic issue.  He will continue to follow with his specialist.  His right hip pain is likely related to the imbalance created by his boot.  Discussed that we could have him see orthopedics and get an x-ray though he will monitor for now.

## 2022-06-13 NOTE — Assessment & Plan Note (Signed)
Chronic issue.  Undetermined control.  Check A1c today.  Patient will continue glipizide 10 mg daily, Jardiance 25 mg daily, metformin 1000 mg twice daily, and Mounjaro 2.5 mg daily.

## 2022-06-13 NOTE — Progress Notes (Signed)
Marikay Alar, MD Phone: [phone]  Joseph Singh is a 62 y.o. male who presents today for f/u.  DIABETES Disease Monitoring: Blood Sugar ranges-not checking Polyuria/phagia/dipsia- no      Optho- due Medications: Compliance- taking mounjaro, glipizide, jardiance, metformin Hypoglycemic symptoms- no  HYPERTENSION Disease Monitoring Home BP Monitoring not checking Chest pain- no    Dyspnea- no Medications Compliance-  taking amlodipine, lisinopril/HCTZ, metoprolol.  Edema- none other than related to his chronic left foot issues BMET    Component Value Date/Time   NA 136 04/08/2022 1135   K 4.4 04/08/2022 1135   CL 100 04/08/2022 1135   CO2 27 04/08/2022 1135   GLUCOSE 133 (H) 04/08/2022 1135   BUN 15 04/08/2022 1135   CREATININE 1.11 04/08/2022 1135   CREATININE 1.15 09/30/2016 1526   CALCIUM 9.6 04/08/2022 1135   GFRNONAA >60 04/03/2022 0213   GFRAA >60 02/01/2019 1559   Left foot amputation: Patient notes he had a skin graft recently.  He follows up with him again this week.  He has been wearing a protective boot notes that throws off at this gait and causes imbalance.  This is caused his right hip to bother him.  He notes it has been going on for months.  He notes he sits down and the pain goes away quickly.  Kidney stone: Patient notes no recurrence of kidney stone issues.  He is status post procedure for this.   Social History   Tobacco Use  Smoking Status Former   Packs/day: 0.50   Years: 35.00   Additional pack years: 0.00   Total pack years: 17.50   Types: Cigarettes   Quit date: 08/09/2018   Years since quitting: 3.8  Smokeless Tobacco Never    Current Outpatient Medications on File Prior to Visit  Medication Sig Dispense Refill   aspirin EC 81 MG tablet Take 1 tablet (81 mg total) by mouth daily. 150 tablet 2   Blood Glucose Monitoring Suppl DEVI Use to check glucose as directed. May substitute to any manufacturer covered by patient's insurance. 1  each 0   Cholecalciferol (VITAMIN D) 125 MCG (5000 UT) CAPS Take 1 capsule by mouth daily. 90 capsule 0   clopidogrel (PLAVIX) 75 MG tablet TAKE ONE TABLET BY MOUTH EVERY DAY 90 tablet 3   cyanocobalamin (VITAMIN B12) 1000 MCG tablet Take 1 tablet (1,000 mcg total) by mouth daily. 90 tablet 0   gabapentin (NEURONTIN) 300 MG capsule TAKE 1 CAPSULE BY MOUTH FOUR TIMES DAILY 360 capsule 2   glipiZIDE (GLUCOTROL XL) 10 MG 24 hr tablet TAKE ONE TABLET BY MOUTH EACH DAY WITH BREAKFAST 90 tablet 3   Glucose Blood (BLOOD GLUCOSE TEST STRIPS) STRP Use to check glucose twice daily. May substitute to any manufacturer covered by patient's insurance. 100 strip 3   iron polysaccharides (NIFEREX) 150 MG capsule Take 1 capsule (150 mg total) by mouth daily. 90 capsule 0   JARDIANCE 25 MG TABS tablet TAKE 1 TABLET BY MOUTH ONCE DAILY 90 tablet 3   Lancet Device MISC Use to check glucose twice daily. May substitute to any manufacturer covered by patient's insurance. 1 each 1   Lancets Misc. MISC Use to check glucose twice dialy. May substitute to any manufacturer covered by patient's insurance. 100 each 3   lisinopril-hydrochlorothiazide (ZESTORETIC) 10-12.5 MG tablet TAKE 1 TABLET BY MOUTH DAILY 90 tablet 3   metFORMIN (GLUCOPHAGE) 1000 MG tablet TAKE ONE TABLET BY MOUTH TWICE DAILY WITH A MEAL 90 tablet  1   metoprolol succinate (TOPROL-XL) 50 MG 24 hr tablet TAKE ONE TABLET BY MOUTH ONCE DAILY WITHOR IMMEDIATELY FOLLOWING A MEAL 90 tablet 1   Multiple Vitamin (MULTIVITAMIN) tablet Take 1 tablet by mouth daily.     tirzepatide [facility]) 2.5 MG/0.5ML Pen Inject 2.5 mg into the skin once a week. 2 mL 2   traMADol (ULTRAM) 50 MG tablet TAKE 1 TABLET BY MOUTH EVERY 8 HOURS AS NEEDED FOR MODERATE PAIN 90 tablet 0   No current facility-administered medications on file prior to visit.     ROS see history of present illness  Objective  Physical Exam Vitals:   06/13/22 1018  BP: 124/70  Pulse: 69  Temp: 98.1  F (36.7 C)  SpO2: 96%    BP Readings from Last 3 Encounters:  06/13/22 124/70  04/23/22 (!) 103/58  04/22/22 123/64   Wt Readings from Last 3 Encounters:  06/13/22 300 lb (136.1 kg)  04/23/22 195 lb (88.5 kg)  04/08/22 286 lb (129.7 kg)    Physical Exam Constitutional:      General: He is not in acute distress.    Appearance: He is not diaphoretic.  Cardiovascular:     Rate and Rhythm: Normal rate and regular rhythm.     Heart sounds: Normal heart sounds.  Pulmonary:     Effort: Pulmonary effort is normal.     Breath sounds: Normal breath sounds.  Skin:    General: Skin is warm and dry.  Neurological:     Mental Status: He is alert.      Assessment/Plan: Please see individual problem list.  Uncontrolled type 2 diabetes mellitus with hyperglycemia, without long-term current use of insulin Assessment & Plan: Chronic issue.  Undetermined control.  Check A1c today.  Patient will continue glipizide 10 mg daily, Jardiance 25 mg daily, metformin 1000 mg twice daily, and Mounjaro 2.5 mg daily.  Orders: -     Hemoglobin A1c  Essential hypertension Assessment & Plan: Chronic issue.  Adequately controlled.  Patient will continue amlodipine 10 mg daily, lisinopril-HCTZ 1 tablet daily, and metoprolol 50 mg daily.  Orders: -     amLODIPine Besylate; Take 1 tablet (10 mg total) by mouth daily.  Dispense: 90 tablet; Refill: 3 -     Comprehensive metabolic panel  Hyperlipidemia, unspecified hyperlipidemia type -     Atorvastatin Calcium; Take 1 tablet (80 mg total) by mouth daily.  Dispense: 90 tablet; Refill: 3  Kidney stone Assessment & Plan: Resolved.  Patient will monitor for any recurrent symptoms.   Partial nontraumatic amputation of foot, left Assessment & Plan: Chronic issue.  He will continue to follow with his specialist.  His right hip pain is likely related to the imbalance created by his boot.  Discussed that we could have him see orthopedics and get an x-ray  though he will monitor for now.      Health Maintenance: Discussed colon cancer screening.  Patient notes he has a kit at home from his insurance that he is considering doing.  He does not want to complete a colonoscopy at this time.  Return in about 3 months (around 09/12/2022).   Marikay Alar, MD [facility]

## 2022-06-16 ENCOUNTER — Other Ambulatory Visit: Payer: Self-pay | Admitting: Pharmacist

## 2022-06-16 MED ORDER — TIRZEPATIDE 5 MG/0.5ML ~~LOC~~ SOAJ: 5.0000 mg | SUBCUTANEOUS | 2 refills | Status: DC

## 2022-06-16 MED ORDER — GLIPIZIDE ER 5 MG PO TB24: 5.0000 mg | ORAL_TABLET | Freq: Every day | ORAL | 1 refills | Status: DC

## 2022-06-16 NOTE — Progress Notes (Signed)
Care Coordination Call  Received message from patient/patient's wife asking if Joseph Singh could be increased to 5 mg weekly. Last A1c was controlled, but opportunity to reduce glipizide.   Increase Mounjaro to 5 mg weekly, reduce glipizide to 5 mg daily. Contact us if development of hypoglycemia.   Follow up call scheduled with me in 6 weeks.   Catie Eppie Gibson, PharmD, BCACP, CPP [facility] [phone]

## 2022-06-24 ENCOUNTER — Telehealth: Payer: Self-pay | Admitting: Family Medicine

## 2022-06-24 NOTE — Telephone Encounter (Signed)
Contacted Joseph Singh to schedule their annual wellness visit. Call back at later date: 06/27/2022  Patient didn't have his calendar.  Patient requested call back on Monday morning.  Thank you,  [facility] Support [facility] Direct dial  [phone]

## 2022-06-27 NOTE — Telephone Encounter (Signed)
Contacted [name] to schedule their annual wellness visit. Appointment made for 07/04/2022.  Thank you,  [facility] Support [facility] Direct dial  [phone]

## 2022-07-04 ENCOUNTER — Ambulatory Visit (INDEPENDENT_AMBULATORY_CARE_PROVIDER_SITE_OTHER): Payer: 59

## 2022-07-04 VITALS — Wt 300.0 lb

## 2022-07-04 DIAGNOSIS — Z0000 Encounter for general adult medical examination without abnormal findings: Secondary | ICD-10-CM | POA: Diagnosis not present

## 2022-07-04 NOTE — Progress Notes (Signed)
Subjective:   Joseph Singh is a 62 y.o. male who presents for Medicare Annual/Subsequent preventive examination.  Review of Systems    I connected with  Joseph Singh on 07/04/22 by a audio enabled telemedicine application and verified that I am speaking with the correct person using two identifiers.  Patient Medicare AWV questionnaire was completed by the patient on 06/30/22; I have confirmed that all information answered by patient is correct and no changes since this date.     Patient Location: Home  Provider Location: Home Office  I discussed the limitations of evaluation and management by telemedicine. The patient expressed understanding and agreed to proceed.  Cardiac Risk Factors include: diabetes mellitus;hypertension     Objective:    Today's Vitals   06/30/22 0945 07/04/22 1022  Weight:  300 lb (136.1 kg)  PainSc: 3     Body mass index is 36.52 kg/m.     07/04/2022   10:33 AM 04/23/2022   12:40 PM 04/22/2022   10:30 AM 04/14/2022   11:18 AM 03/30/2022   11:00 PM 03/30/2022    4:21 PM 06/22/2021    8:51 AM  Advanced Directives  Does Patient Have a Medical Advance Directive? No No No No No No No  Would patient like information on creating a medical advance directive? No - Patient declined  No - Patient declined  No - Patient declined No - Patient declined No - Patient declined    Current Medications (verified) Outpatient Encounter Medications as of 07/04/2022  Medication Sig   amLODipine (NORVASC) 10 MG tablet Take 1 tablet (10 mg total) by mouth daily.   aspirin EC 81 MG tablet Take 1 tablet (81 mg total) by mouth daily.   atorvastatin (LIPITOR) 80 MG tablet Take 1 tablet (80 mg total) by mouth daily.   Blood Glucose Monitoring Suppl DEVI Use to check glucose as directed. May substitute to any manufacturer covered by patient's insurance.   clopidogrel (PLAVIX) 75 MG tablet TAKE ONE TABLET BY MOUTH EVERY DAY   gabapentin (NEURONTIN) 300 MG capsule TAKE 1 CAPSULE BY  MOUTH FOUR TIMES DAILY   glipiZIDE (GLUCOTROL XL) 5 MG 24 hr tablet Take 1 tablet (5 mg total) by mouth daily.   Glucose Blood (BLOOD GLUCOSE TEST STRIPS) STRP Use to check glucose twice daily. May substitute to any manufacturer covered by patient's insurance.   iron polysaccharides (NIFEREX) 150 MG capsule Take 1 capsule (150 mg total) by mouth daily.   JARDIANCE 25 MG TABS tablet TAKE 1 TABLET BY MOUTH ONCE DAILY   Lancet Device MISC Use to check glucose twice daily. May substitute to any manufacturer covered by patient's insurance.   Lancets Misc. MISC Use to check glucose twice dialy. May substitute to any manufacturer covered by patient's insurance.   lisinopril-hydrochlorothiazide (ZESTORETIC) 10-12.5 MG tablet TAKE 1 TABLET BY MOUTH DAILY   metFORMIN (GLUCOPHAGE) 1000 MG tablet TAKE ONE TABLET BY MOUTH TWICE DAILY WITH A MEAL   metoprolol succinate (TOPROL-XL) 50 MG 24 hr tablet TAKE ONE TABLET BY MOUTH ONCE DAILY WITHOR IMMEDIATELY FOLLOWING A MEAL   Multiple Vitamin (MULTIVITAMIN) tablet Take 1 tablet by mouth daily.   tirzepatide [facility]) 5 MG/0.5ML Pen Inject 5 mg into the skin once a week.   traMADol (ULTRAM) 50 MG tablet TAKE 1 TABLET BY MOUTH EVERY 8 HOURS AS NEEDED FOR MODERATE PAIN   No facility-administered encounter medications on file as of 07/04/2022.    Allergies (verified) Patient has no known allergies.  History: Past Medical History:  Diagnosis Date   Anemia    Arthritis    Diabetes mellitus without complication [name]    History of kidney stones    h/o   Hyperlipidemia    Hypertension    Neuromuscular disorder [name]    Neuropathy    Past Surgical History:  Procedure Laterality Date   AMPUTATION TOE Left 08/10/2018   Procedure: RAY LEFT;  Surgeon: Gwyneth Revels, DPM;  Location: [facility];  Service: Podiatry;  Laterality: Left;   ARTHRODESIS METATARSAL Left 02/01/2019   Procedure: ARTHRODESIS METATARSAL;  Surgeon: Gwyneth Revels, DPM;  Location: [facility];   Service: Podiatry;  Laterality: Left;   CYSTOSCOPY/URETEROSCOPY/HOLMIUM LASER/STENT PLACEMENT Right 04/01/2022   Procedure: CYSTOSCOPY/URETEROSCOPY/HOLMIUM LASER/STENT PLACEMENT;  Surgeon: Sondra Come, MD;  Location: [facility];  Service: Urology;  Laterality: Right;   CYSTOSCOPY/URETEROSCOPY/HOLMIUM LASER/STENT PLACEMENT Right 04/22/2022   Procedure: CYSTOSCOPY/URETEROSCOPY/HOLMIUM LASER/STENT EXCHANGE;  Surgeon: Sondra Come, MD;  Location: [facility];  Service: Urology;  Laterality: Right;   FOOT ARTHRODESIS Left 02/01/2019   Procedure: ARTHRODESIS FOOT;STJ;  Surgeon: Gwyneth Revels, DPM;  Location: [facility];  Service: Podiatry;  Laterality: Left;   IRRIGATION AND DEBRIDEMENT FOOT Left 10/26/2018   Procedure: IRRIGATION AND DEBRIDEMENT FOOT, LEFT;  Surgeon: Gwyneth Revels, DPM;  Location: [facility];  Service: Podiatry;  Laterality: Left;   LOWER EXTREMITY ANGIOGRAPHY Right 02/25/2016   Procedure: Lower Extremity Angiography;  Surgeon: Annice Needy, MD;  Location: [facility];  Service: Cardiovascular;  Laterality: Right;   LOWER EXTREMITY ANGIOGRAPHY Right 02/27/2017   Procedure: LOWER EXTREMITY ANGIOGRAPHY;  Surgeon: Annice Needy, MD;  Location: [facility];  Service: Cardiovascular;  Laterality: Right;   LOWER EXTREMITY ANGIOGRAPHY Left 08/06/2018   Procedure: LOWER EXTREMITY ANGIOGRAPHY;  Surgeon: Annice Needy, MD;  Location: [facility];  Service: Cardiovascular;  Laterality: Left;   LOWER EXTREMITY INTERVENTION  02/25/2016   Procedure: Lower Extremity Intervention;  Surgeon: Annice Needy, MD;  Location: [facility];  Service: Cardiovascular;;   SHOULDER OPEN ROTATOR CUFF REPAIR Right    TOE AMPUTATION     Family History  Problem Relation Age of Onset   Diabetes Mother    Heart disease Father    Diabetes Sister    Diabetes Brother    Cancer Sister        lung   Cancer Sister        breast   Social History   Socioeconomic History   Marital  status: Married    Spouse name: Not on file   Number of children: 1   Years of education: Not on file   Highest education level: GED or equivalent  Occupational History   Not on file  Tobacco Use   Smoking status: Former    Packs/day: 0.50    Years: 35.00    Additional pack years: 0.00    Total pack years: 17.50    Types: Cigarettes    Quit date: 08/09/2018    Years since quitting: 3.9   Smokeless tobacco: Never  Vaping Use   Vaping Use: Never used  Substance and Sexual Activity   Alcohol use: No    Alcohol/week: 0.0 standard drinks of alcohol   Drug use: No   Sexual activity: Yes    Partners: Female    Comment: Wife  Other Topics Concern   Not on file  Social History Narrative   Permanently disabled   [place] on the job and hurt his  shoulder    Lives with wife and 2 children    1 grandchild from a previous marriage    Pets: Not inside    GED    Right handed    Caffeine- 2-3 sodas, tea, no coffee    Enjoys watching tv and being outside    Social Determinants of Health   Financial Resource Strain: Medium Risk (07/04/2022)   Overall Financial Resource Strain (CARDIA)    Difficulty of Paying Living Expenses: Somewhat hard  Food Insecurity: No Food Insecurity (07/04/2022)   Hunger Vital Sign    Worried About Running Out of Food in the Last Year: Never true    Ran Out of Food in the Last Year: Never true  Recent Concern: Food Insecurity - Food Insecurity Present (06/12/2022)   Hunger Vital Sign    Worried About Running Out of Food in the Last Year: Sometimes true    Ran Out of Food in the Last Year: Sometimes true  Transportation Needs: No Transportation Needs (07/04/2022)   PRAPARE - Administrator, Civil Service (Medical): No    Lack of Transportation (Non-Medical): No  Physical Activity: Insufficiently Active (07/04/2022)   Exercise Vital Sign    Days of Exercise per Week: 7 days    Minutes of Exercise per Session: 20 min  Stress: No Stress Concern Present  (07/04/2022)   [employer] of Occupational Health - Occupational Stress Questionnaire    Feeling of Stress : Not at all  Social Connections: Patient Unable To Answer (07/04/2022)   Social Connection and Isolation Panel [NHANES]    Frequency of Communication with Friends and Family: Patient unable to answer    Frequency of Social Gatherings with Friends and Family: Patient unable to answer    Attends Religious Services: Patient unable to answer    Active Member of Clubs or Organizations: Patient unable to answer    Attends Banker Meetings: Patient unable to answer    Marital Status: Patient unable to answer    Tobacco Counseling Counseling given: Yes   Clinical Intake:  Pre-visit preparation completed: Yes  Pain : 0-10 Pain Score: 3  Pain Type: Chronic pain Pain Location: Hip Pain Orientation: Right Pain Descriptors / Indicators: Discomfort Pain Onset: In the past 7 days Pain Frequency: Intermittent     BMI - recorded: 36.52 Nutritional Status: BMI > 30  Obese Nutritional Risks: Non-healing wound Diabetes: Yes CBG done?: No Did pt. bring in CBG monitor from home?: No  How often do you need to have someone help you when you read instructions, pamphlets, or other written materials from your doctor or pharmacy?: 1 - Never  Diabetic?Yes   Interpreter Needed?: No      Activities of Daily Living    06/30/2022    9:45 AM 04/22/2022   10:30 AM  In your present state of health, do you have any difficulty performing the following activities:  Hearing? 0 0  Vision? 0 0  Difficulty concentrating or making decisions? 1 0  Walking or climbing stairs? 1 1  Dressing or bathing? 0 0  Doing errands, shopping? 0   Preparing Food and eating ? N   Using the Toilet? N   In the past six months, have you accidently leaked urine? Y   Do you have problems with loss of bowel control? N   Managing your Medications? N   Managing your Finances? N   Housekeeping or  managing your Housekeeping? N     Patient  Care Team: Glori Luis, MD as PCP - General (Family Medicine) Vogler, Suzan Garibaldi, DPM as Referring Physician (Podiatry)  Indicate any recent Medical Services you may have received from other than [facility] providers in the past year (date may be approximate).     Assessment:   This is a routine wellness examination for [facility].  Hearing/Vision screen Hearing Screening - Comments:: Denies hearing difficulties   Vision Screening - Comments:: up to date with routine eye exams with  [facility]   Dietary issues and exercise activities discussed: Current Exercise Habits: Home exercise routine, Type of exercise: walking, Time (Minutes): 20, Frequency (Times/Week): 5, Weekly Exercise (Minutes/Week): 100, Intensity: Mild, Exercise limited by: orthopedic condition(s) (patient is wearing a CAM walker on foot)   Goals Addressed               This Visit's Progress     Patient Stated     DIET - INCREASE WATER INTAKE (pt-stated)   On track     Less soda.      Other     Patient Stated        Work on weight loss       Depression Screen    07/04/2022   10:29 AM 07/04/2022   10:28 AM 06/13/2022   10:19 AM 04/08/2022   10:55 AM 03/09/2022    4:18 PM 10/06/2021    9:00 AM 06/22/2021    8:37 AM  PHQ 2/9 Scores  PHQ - 2 Score 0 0 0 0 0 0 0  PHQ- 9 Score 0 0 0 0       Fall Risk    06/30/2022    9:45 AM 06/13/2022   10:19 AM 04/08/2022   10:55 AM 03/09/2022    4:18 PM 10/06/2021    8:59 AM  Fall Risk   Falls in the past year? 0 0 0 0 0  Number falls in past yr: 0 0 0 0 0  Injury with Fall? 0 0 0 0 0  Risk for fall due to : No Fall Risks No Fall Risks No Fall Risks No Fall Risks No Fall Risks  Follow up Falls prevention discussed;Falls evaluation completed Falls evaluation completed Falls evaluation completed Falls evaluation completed Falls evaluation completed    FALL RISK PREVENTION PERTAINING TO THE HOME:  Any stairs in or around the home?  Yes  If so, are there any without handrails? No  Home free of loose throw rugs in walkways, pet beds, electrical cords, etc? No  Adequate lighting in your home to reduce risk of falls? Yes   ASSISTIVE DEVICES UTILIZED TO PREVENT FALLS:  Life alert? No  Use of a cane, walker or w/c? No  Grab bars in the bathroom? No  Shower chair or bench in shower? Yes  Elevated toilet seat or a handicapped toilet? No   TIMED UP AND GO:  Was the test performed?  No televisit .  Cognitive Function:    01/19/2017    9:44 AM  MMSE - Mini Mental State Exam  Orientation to time 5  Orientation to Place 5  Registration 3  Attention/ Calculation 5  Recall 3  Language- name 2 objects 2  Language- repeat 1  Language- follow 3 step command 3  Language- read & follow direction 1  Write a sentence 1  Copy design 1  Total score 30        07/04/2022   10:30 AM 06/18/2020    9:10 AM 06/18/2019  9:20 AM 01/29/2018    9:32 AM  6CIT Screen  What Year? 0 points 0 points 0 points 0 points  What month? 0 points 0 points 0 points 0 points  What time? 0 points 0 points 0 points 0 points  Count back from 20 0 points 0 points 0 points 0 points  Months in reverse 0 points 0 points 0 points 0 points  Repeat phrase 0 points 0 points    Total Score 0 points 0 points      Immunizations Immunization History  Administered Date(s) Administered   Influenza,inj,Quad PF,6+ Mos 12/11/2017, 10/22/2018    TDAP status: Due, Education has been provided regarding the importance of this vaccine. Advised may receive this vaccine at local pharmacy or Health Dept. Aware to provide a copy of the vaccination record if obtained from local pharmacy or Health Dept. Verbalized acceptance and understanding.  Flu Vaccine status: Up to date  Pneumococcal vaccine status: Up to date  Covid-19 vaccine status: Information provided on how to obtain vaccines.   Qualifies for Shingles Vaccine?  Patient Declined    Zostavax  completed  Patient Declined    Shingrix Completed?: No.    Education has been provided regarding the importance of this vaccine. Patient has been advised to call insurance company to determine out of pocket expense if they have not yet received this vaccine. Advised may also receive vaccine at local pharmacy or Health Dept. Verbalized acceptance and understanding.  Screening Tests Health Maintenance  Topic Date Due   DTaP/Tdap/Td (1 - Tdap) Never done   OPHTHALMOLOGY EXAM  01/12/2022   FOOT EXAM  07/01/2022   Zoster Vaccines- Shingrix (1 of 2) 08/03/2023 (Originally 02/12/1980)   COLONOSCOPY (Pts 45-71yrs Insurance coverage will need to be confirmed)  09/07/2023 (Originally 02/13/2019)   INFLUENZA VACCINE  09/22/2022   HEMOGLOBIN A1C  12/13/2022   Diabetic kidney evaluation - Urine ACR  02/18/2023   Diabetic kidney evaluation - eGFR measurement  06/13/2023   Medicare Annual Wellness (AWV)  07/04/2023   Hepatitis C Screening  Completed   HIV Screening  Completed   HPV VACCINES  Aged Out   COVID-19 Vaccine  Discontinued    Health Maintenance  Health Maintenance Due  Topic Date Due   DTaP/Tdap/Td (1 - Tdap) Never done   OPHTHALMOLOGY EXAM  01/12/2022   FOOT EXAM  07/01/2022    Colorectal cancer screening: Type of screening: Colonoscopy. Completed Patient Declined . Repeat every 10 years  Lung Cancer Screening: (Low Dose CT Chest recommended if Age 75-80 years, 30 pack-year currently smoking OR have quit w/in 15years.) does qualify.   Lung Cancer Screening Referral: Patient Declined   Additional Screening:  Hepatitis C Screening: does not qualify Vision Screening: Recommended annual ophthalmology exams for early detection of glaucoma and other disorders of the eye. Is the patient up to date with their annual eye exam?  Yes  Who is the provider or what is the name of the office in which the patient attends annual eye exams? [facility]  If pt is not established with a provider,  would they like to be referred to a provider to establish care? No .   Dental Screening: Recommended annual dental exams for proper oral hygiene  Community Resource Referral / Chronic Care Management: CRR required this visit?  No   CCM required this visit?  No      Plan:     I have personally reviewed and noted the following in the patient's chart:  Medical and social history Use of alcohol, tobacco or illicit drugs  Current medications and supplements including opioid prescriptions. Patient is not currently taking opioid prescriptions. Functional ability and status Nutritional status Physical activity Advanced directives List of other physicians Hospitalizations, surgeries, and ER visits in previous 12 months Vitals Screenings to include cognitive, depression, and falls Referrals and appointments  In addition, I have reviewed and discussed with patient certain preventive protocols, quality metrics, and best practice recommendations. A written personalized care plan for preventive services as well as general preventive health recommendations were provided to patient.     Annabell Sabal, CMA   07/04/2022   Nurse Notes: Patient would like to Discuss getting a Power chair or scooter on next visit in July.

## 2022-07-04 NOTE — Patient Instructions (Signed)
Joseph Singh , Thank you for taking time to come for your Medicare Wellness Visit. I appreciate your ongoing commitment to your health goals. Please review the following plan we discussed and let me know if I can assist you in the future.   These are the goals we discussed:  Goals       Patient Stated     DIET - INCREASE WATER INTAKE (pt-stated)      Less soda.      Weight (lb) < 298 lb (135.2 kg) (pt-stated)      I want to lose about 50lb Reduce sugar intake Monitor diet Stay active      Other     Patient Stated      Work on weight loss         This is a list of the screening recommended for you and due dates:  Health Maintenance  Topic Date Due   DTaP/Tdap/Td vaccine (1 - Tdap) Never done   Eye exam for diabetics  01/12/2022   Complete foot exam   07/01/2022   Zoster (Shingles) Vaccine (1 of 2) 08/03/2023*   Colon Cancer Screening  09/07/2023*   Flu Shot  09/22/2022   Hemoglobin A1C  12/13/2022   Yearly kidney health urinalysis for diabetes  02/18/2023   Yearly kidney function blood test for diabetes  06/13/2023   Medicare Annual Wellness Visit  07/04/2023   Hepatitis C Screening: USPSTF Recommendation to screen - Ages 18-79 yo.  Completed   HIV Screening  Completed   HPV Vaccine  Aged Out   COVID-19 Vaccine  Discontinued  *Topic was postponed. The date shown is not the original due date.    Advanced directives: Advance directive discussed with you today. Even though you declined this today, please call our office should you change your mind, and we can give you the proper paperwork for you to fill out.   Conditions/risks identified: Diabetes Mellitus and Nutrition, Adult When you have diabetes, or diabetes mellitus, it is very important to have healthy eating habits because your blood sugar (glucose) levels are greatly affected by what you eat and drink. Eating healthy foods in the right amounts, at about the same times every day, can help you: Manage your blood  glucose. Lower your risk of heart disease. Improve your blood pressure. Reach or maintain a healthy weight. What can affect my meal plan? Every person with diabetes is different, and each person has different needs for a meal plan. Your health care provider may recommend that you work with a dietitian to make a meal plan that is best for you. Your meal plan may vary depending on factors such as: The calories you need. The medicines you take. Your weight. Your blood glucose, blood pressure, and cholesterol levels. Your activity level. Other health conditions you have, such as heart or kidney disease. How do carbohydrates affect me? Carbohydrates, also called carbs, affect your blood glucose level more than any other type of food. Eating carbs raises the amount of glucose in your blood. It is important to know how many carbs you can safely have in each meal. This is different for every person. Your dietitian can help you calculate how many carbs you should have at each meal and for each snack. How does alcohol affect me? Alcohol can cause a decrease in blood glucose (hypoglycemia), especially if you use insulin or take certain diabetes medicines by mouth. Hypoglycemia can be a life-threatening condition. Symptoms of hypoglycemia, such as sleepiness, dizziness,  and confusion, are similar to symptoms of having too much alcohol. Do not drink alcohol if: Your health care provider tells you not to drink. You are pregnant, may be pregnant, or are planning to become pregnant. If you drink alcohol: Limit how much you have to: 0-1 drink a day for women. 0-2 drinks a day for men. Know how much alcohol is in your drink. In the [place] one drink equals one 12 oz bottle of beer (355 mL), one 5 oz glass of wine (148 mL), or one 1 oz glass of hard liquor (44 mL). Keep yourself hydrated with water, diet soda, or unsweetened iced tea. Keep in mind that regular soda, juice, and other mixers may contain a lot of  sugar and must be counted as carbs. What are tips for following this plan?  Reading food labels Start by checking the serving size on the Nutrition Facts label of packaged foods and drinks. The number of calories and the amount of carbs, fats, and other nutrients listed on the label are based on one serving of the item. Many items contain more than one serving per package. Check the total grams (g) of carbs in one serving. Check the number of grams of saturated fats and trans fats in one serving. Choose foods that have a low amount or none of these fats. Check the number of milligrams (mg) of salt (sodium) in one serving. Most people should limit total sodium intake to less than 2,300 mg per day. Always check the nutrition information of foods labeled as "low-fat" or "nonfat." These foods may be higher in added sugar or refined carbs and should be avoided. Talk to your dietitian to identify your daily goals for nutrients listed on the label. Shopping Avoid buying canned, pre-made, or processed foods. These foods tend to be high in fat, sodium, and added sugar. Shop around the outside edge of the grocery store. This is where you will most often find fresh fruits and vegetables, bulk grains, fresh meats, and fresh dairy products. Cooking Use low-heat cooking methods, such as baking, instead of high-heat cooking methods, such as deep frying. Cook using healthy oils, such as olive, canola, or sunflower oil. Avoid cooking with butter, cream, or high-fat meats. Meal planning Eat meals and snacks regularly, preferably at the same times every day. Avoid going long periods of time without eating. Eat foods that are high in fiber, such as fresh fruits, vegetables, beans, and whole grains. Eat 4-6 oz (112-168 g) of lean protein each day, such as lean meat, chicken, fish, eggs, or tofu. One ounce (oz) (28 g) of lean protein is equal to: 1 oz (28 g) of meat, chicken, or fish. 1 egg.  cup (62 g) of  tofu. Eat some foods each day that contain healthy fats, such as avocado, nuts, seeds, and fish. What foods should I eat? Fruits Berries. Apples. Oranges. Peaches. Apricots. Plums. Grapes. Mangoes. Papayas. Pomegranates. Kiwi. Cherries. Vegetables Leafy greens, including lettuce, spinach, kale, chard, collard greens, mustard greens, and cabbage. Beets. Cauliflower. Broccoli. Carrots. Green beans. Tomatoes. Peppers. Onions. Cucumbers. Brussels sprouts. Grains Whole grains, such as whole-wheat or whole-grain bread, crackers, tortillas, cereal, and pasta. Unsweetened oatmeal. Quinoa. Brown or wild rice. Meats and other proteins Seafood. Poultry without skin. Lean cuts of poultry and beef. Tofu. Nuts. Seeds. Dairy Low-fat or fat-free dairy products such as milk, yogurt, and cheese. The items listed above may not be a complete list of foods and beverages you can eat and drink. Contact a  dietitian for more information. What foods should I avoid? Fruits Fruits canned with syrup. Vegetables Canned vegetables. Frozen vegetables with butter or cream sauce. Grains Refined white flour and flour products such as bread, pasta, snack foods, and cereals. Avoid all processed foods. Meats and other proteins Fatty cuts of meat. Poultry with skin. Breaded or fried meats. Processed meat. Avoid saturated fats. Dairy Full-fat yogurt, cheese, or milk. Beverages Sweetened drinks, such as soda or iced tea. The items listed above may not be a complete list of foods and beverages you should avoid. Contact a dietitian for more information. Questions to ask a health care provider Do I need to meet with a certified diabetes care and education specialist? Do I need to meet with a dietitian? What number can I call if I have questions? When are the best times to check my blood glucose? Where to find more information: American Diabetes Association: diabetes.org Academy of Nutrition and Dietetics:  eatright.[employer] of Diabetes and Digestive and Kidney Diseases: StageSync.si Association of Diabetes Care & Education Specialists: diabeteseducator.org Summary It is important to have healthy eating habits because your blood sugar (glucose) levels are greatly affected by what you eat and drink. It is important to use alcohol carefully. A healthy meal plan will help you manage your blood glucose and lower your risk of heart disease. Your health care provider may recommend that you work with a dietitian to make a meal plan that is best for you. This information is not intended to replace advice given to you by your health care provider. Make sure you discuss any questions you have with your health care provider. Document Revised: 09/11/2019 Document Reviewed: 09/11/2019 Elsevier Patient Education  2023 [employer].   Next appointment: Follow up in one year for your annual wellness visit   Preventive Care 40-64 Years, Male Preventive care refers to lifestyle choices and visits with your health care provider that can promote health and wellness. What does preventive care include? A yearly physical exam. This is also called an annual well check. Dental exams once or twice a year. Routine eye exams. Ask your health care provider how often you should have your eyes checked. Personal lifestyle choices, including: Daily care of your teeth and gums. Regular physical activity. Eating a healthy diet. Avoiding tobacco and drug use. Limiting alcohol use. Practicing safe sex. Taking low-dose aspirin every day starting at age 72. What happens during an annual well check? The services and screenings done by your health care provider during your annual well check will depend on your age, overall health, lifestyle risk factors, and family history of disease. Counseling  Your health care provider may ask you questions about your: Alcohol use. Tobacco use. Drug use. Emotional  well-being. Home and relationship well-being. Sexual activity. Eating habits. Work and work Astronomer. Screening  You may have the following tests or measurements: Height, weight, and BMI. Blood pressure. Lipid and cholesterol levels. These may be checked every 5 years, or more frequently if you are over 87 years old. Skin check. Lung cancer screening. You may have this screening every year starting at age 61 if you have a 30-pack-year history of smoking and currently smoke or have quit within the past 15 years. Fecal occult blood test (FOBT) of the stool. You may have this test every year starting at age 27. Flexible sigmoidoscopy or colonoscopy. You may have a sigmoidoscopy every 5 years or a colonoscopy every 10 years starting at age 4. Prostate cancer screening. Recommendations  will vary depending on your family history and other risks. Hepatitis C blood test. Hepatitis B blood test. Sexually transmitted disease (STD) testing. Diabetes screening. This is done by checking your blood sugar (glucose) after you have not eaten for a while (fasting). You may have this done every 1-3 years. Discuss your test results, treatment options, and if necessary, the need for more tests with your health care provider. Vaccines  Your health care provider may recommend certain vaccines, such as: Influenza vaccine. This is recommended every year. Tetanus, diphtheria, and acellular pertussis (Tdap, Td) vaccine. You may need a Td booster every 10 years. Zoster vaccine. You may need this after age 56. Pneumococcal 13-valent conjugate (PCV13) vaccine. You may need this if you have certain conditions and have not been vaccinated. Pneumococcal polysaccharide (PPSV23) vaccine. You may need one or two doses if you smoke cigarettes or if you have certain conditions. Talk to your health care provider about which screenings and vaccines you need and how often you need them. This information is not intended to  replace advice given to you by your health care provider. Make sure you discuss any questions you have with your health care provider. Document Released: 03/06/2015 Document Revised: 10/28/2015 Document Reviewed: 12/09/2014 Elsevier Interactive Patient Education  2017 [employer].  Fall Prevention in the Home Falls can cause injuries. They can happen to people of all ages. There are many things you can do to make your home safe and to help prevent falls. What can I do on the outside of my home? Regularly fix the edges of walkways and driveways and fix any cracks. Remove anything that might make you trip as you walk through a door, such as a raised step or threshold. Trim any bushes or trees on the path to your home. Use bright outdoor lighting. Clear any walking paths of anything that might make someone trip, such as rocks or tools. Regularly check to see if handrails are loose or broken. Make sure that both sides of any steps have handrails. Any raised decks and porches should have guardrails on the edges. Have any leaves, snow, or ice cleared regularly. Use sand or salt on walking paths during winter. Clean up any spills in your garage right away. This includes oil or grease spills. What can I do in the bathroom? Use night lights. Install grab bars by the toilet and in the tub and shower. Do not use towel bars as grab bars. Use non-skid mats or decals in the tub or shower. If you need to sit down in the shower, use a plastic, non-slip stool. Keep the floor dry. Clean up any water that spills on the floor as soon as it happens. Remove soap buildup in the tub or shower regularly. Attach bath mats securely with double-sided non-slip rug tape. Do not have throw rugs and other things on the floor that can make you trip. What can I do in the bedroom? Use night lights. Make sure that you have a light by your bed that is easy to reach. Do not use any sheets or blankets that are too big for  your bed. They should not hang down onto the floor. Have a firm chair that has side arms. You can use this for support while you get dressed. Do not have throw rugs and other things on the floor that can make you trip. What can I do in the kitchen? Clean up any spills right away. Avoid walking on wet floors. Keep items  that you use a lot in easy-to-reach places. If you need to reach something above you, use a strong step stool that has a grab bar. Keep electrical cords out of the way. Do not use floor polish or wax that makes floors slippery. If you must use wax, use non-skid floor wax. Do not have throw rugs and other things on the floor that can make you trip. What can I do with my stairs? Do not leave any items on the stairs. Make sure that there are handrails on both sides of the stairs and use them. Fix handrails that are broken or loose. Make sure that handrails are as long as the stairways. Check any carpeting to make sure that it is firmly attached to the stairs. Fix any carpet that is loose or worn. Avoid having throw rugs at the top or bottom of the stairs. If you do have throw rugs, attach them to the floor with carpet tape. Make sure that you have a light switch at the top of the stairs and the bottom of the stairs. If you do not have them, ask someone to add them for you. What else can I do to help prevent falls? Wear shoes that: Do not have high heels. Have rubber bottoms. Are comfortable and fit you well. Are closed at the toe. Do not wear sandals. If you use a stepladder: Make sure that it is fully opened. Do not climb a closed stepladder. Make sure that both sides of the stepladder are locked into place. Ask someone to hold it for you, if possible. Clearly mark and make sure that you can see: Any grab bars or handrails. First and last steps. Where the edge of each step is. Use tools that help you move around (mobility aids) if they are needed. These  include: Canes. Walkers. Scooters. Crutches. Turn on the lights when you go into a dark area. Replace any light bulbs as soon as they burn out. Set up your furniture so you have a clear path. Avoid moving your furniture around. If any of your floors are uneven, fix them. If there are any pets around you, be aware of where they are. Review your medicines with your doctor. Some medicines can make you feel dizzy. This can increase your chance of falling. Ask your doctor what other things that you can do to help prevent falls. This information is not intended to replace advice given to you by your health care provider. Make sure you discuss any questions you have with your health care provider. Document Released: 12/04/2008 Document Revised: 07/16/2015 Document Reviewed: 03/14/2014 Elsevier Interactive Patient Education  2017 [employer].

## 2022-07-08 ENCOUNTER — Other Ambulatory Visit: Payer: Self-pay | Admitting: Family Medicine

## 2022-07-08 DIAGNOSIS — E1142 Type 2 diabetes mellitus with diabetic polyneuropathy: Secondary | ICD-10-CM

## 2022-07-12 ENCOUNTER — Telehealth: Payer: Self-pay | Admitting: Family Medicine

## 2022-07-12 DIAGNOSIS — E611 Iron deficiency: Secondary | ICD-10-CM

## 2022-07-12 NOTE — Telephone Encounter (Signed)
I would like to recheck his iron levels.  I would like to know these results prior to refilling this medication.  Orders placed.  He can have lab work whenever he is able to.

## 2022-07-12 NOTE — Telephone Encounter (Signed)
Called pt to let him know previous message from provider. As per pt, he will call back to make that lab appt.

## 2022-07-12 NOTE — Telephone Encounter (Signed)
Pt wants to know if provider wants pt to keep taking med iron polysaccharides (NIFEREX) 150 MG capsule.if so he would like refills.  Prescription Request  07/12/2022  LOV: 06/13/2022  What is the name of the medication or equipment? iron polysaccharides (NIFEREX) 150 MG capsule  Have you contacted your pharmacy to request a refill? Yes   Which pharmacy would you like this sent to?   [facility] - [place], [place] - [address] [place] [address] Phone: [phone] Fax: [phone]  Patient notified that their request is being sent to the clinical staff for review and that they should receive a response within 2 business days.   Please advise at Mobile [phone] (mobile)

## 2022-07-18 ENCOUNTER — Other Ambulatory Visit: Payer: Self-pay | Admitting: Family Medicine

## 2022-08-09 ENCOUNTER — Other Ambulatory Visit: Payer: Self-pay | Admitting: Family Medicine

## 2022-08-09 DIAGNOSIS — E1142 Type 2 diabetes mellitus with diabetic polyneuropathy: Secondary | ICD-10-CM

## 2022-08-22 ENCOUNTER — Telehealth: Payer: Self-pay | Admitting: Family Medicine

## 2022-08-22 ENCOUNTER — Other Ambulatory Visit: Payer: Self-pay

## 2022-08-22 DIAGNOSIS — E119 Type 2 diabetes mellitus without complications: Secondary | ICD-10-CM

## 2022-08-22 MED ORDER — METFORMIN HCL 1000 MG PO TABS
1000.0000 mg | ORAL_TABLET | Freq: Two times a day (BID) | ORAL | 1 refills | Status: DC
Start: 2022-08-22 — End: 2022-10-06

## 2022-08-22 NOTE — Telephone Encounter (Signed)
Prescription Request  08/22/2022  LOV: 06/13/2022  What is the name of the medication or equipment? metFORMIN   Have you contacted your pharmacy to request a refill? Yes   Which [facility] you [facility]   Patient notified that their request is being sent to the clinical staff for review and that they should receive a response within 2 business days.   Please advise at Mobile [phone] (mobile)

## 2022-08-22 NOTE — Telephone Encounter (Signed)
Metformin called into [facility]

## 2022-09-07 ENCOUNTER — Other Ambulatory Visit: Payer: Self-pay | Admitting: Family Medicine

## 2022-09-07 DIAGNOSIS — E1142 Type 2 diabetes mellitus with diabetic polyneuropathy: Secondary | ICD-10-CM

## 2022-09-08 ENCOUNTER — Other Ambulatory Visit: Payer: Self-pay | Admitting: Family Medicine

## 2022-09-08 NOTE — Telephone Encounter (Signed)
The patient is supposed to be on the 5 mg dose. This has been refilled.

## 2022-09-12 ENCOUNTER — Ambulatory Visit (INDEPENDENT_AMBULATORY_CARE_PROVIDER_SITE_OTHER): Payer: 59 | Admitting: Family Medicine

## 2022-09-12 ENCOUNTER — Encounter: Payer: Self-pay | Admitting: Family Medicine

## 2022-09-12 VITALS — BP 122/80 | HR 72 | Temp 98.0°F | Ht 76.0 in | Wt 298.2 lb

## 2022-09-12 DIAGNOSIS — E1142 Type 2 diabetes mellitus with diabetic polyneuropathy: Secondary | ICD-10-CM | POA: Diagnosis not present

## 2022-09-12 DIAGNOSIS — Z7985 Long-term (current) use of injectable non-insulin antidiabetic drugs: Secondary | ICD-10-CM | POA: Diagnosis not present

## 2022-09-12 DIAGNOSIS — Z7984 Long term (current) use of oral hypoglycemic drugs: Secondary | ICD-10-CM | POA: Diagnosis not present

## 2022-09-12 DIAGNOSIS — Z89432 Acquired absence of left foot: Secondary | ICD-10-CM

## 2022-09-12 DIAGNOSIS — E1165 Type 2 diabetes mellitus with hyperglycemia: Secondary | ICD-10-CM | POA: Diagnosis not present

## 2022-09-12 LAB — POCT GLYCOSYLATED HEMOGLOBIN (HGB A1C): Hemoglobin A1C: 6.7 % — AB (ref 4.0–5.6)

## 2022-09-12 NOTE — Assessment & Plan Note (Signed)
Chronic issue.  Patient is back in cast for this.  He will continue to follow with podiatry for this.  Patient does wonder about getting a power scooter or wheelchair given his mobility issues with his left foot issue.  Discussed PT evaluation for this to determine if this is necessary.

## 2022-09-12 NOTE — Progress Notes (Signed)
Marikay Alar, MD Phone: [phone]  Joseph Singh is a 62 y.o. male who presents today for f/u.  DIABETES Disease Monitoring: Blood Sugar ranges-not checking Polyuria/phagia/dipsia- no      Optho- scheduled per patient Medications: Compliance- taking glipizide, jardiance, metformin, mounjaro Hypoglycemic symptoms- no  Neuropathy: Chronic issue.  Patient takes tramadol and gabapentin with good benefit.  He notes these occasionally make him drowsy though nothing significant.  He does not usually drive.  Left foot wound/status post partial amputation: Patient notes he finally got this area healed up and then he got into a new shoe in the heel busted open.  He is back in a cast for this.  He continues to follow with podiatry.  He notes they do foot exams on him every few months.   Social History   Tobacco Use  Smoking Status Former   Current packs/day: 0.00   Average packs/day: 0.5 packs/day for 35.0 years (17.5 ttl pk-yrs)   Types: Cigarettes   Start date: 08/09/1983   Quit date: 08/09/2018   Years since quitting: 4.0  Smokeless Tobacco Never    Current Outpatient Medications on File Prior to Visit  Medication Sig Dispense Refill   amLODipine (NORVASC) 10 MG tablet Take 1 tablet (10 mg total) by mouth daily. 90 tablet 3   aspirin EC 81 MG tablet Take 1 tablet (81 mg total) by mouth daily. 150 tablet 2   atorvastatin (LIPITOR) 80 MG tablet Take 1 tablet (80 mg total) by mouth daily. 90 tablet 3   Blood Glucose Monitoring Suppl DEVI Use to check glucose as directed. May substitute to any manufacturer covered by patient's insurance. 1 each 0   clopidogrel (PLAVIX) 75 MG tablet TAKE ONE TABLET BY MOUTH EVERY DAY 90 tablet 3   gabapentin (NEURONTIN) 300 MG capsule TAKE 1 CAPSULE BY MOUTH FOUR TIMES DAILY 360 capsule 2   glipiZIDE (GLUCOTROL XL) 5 MG 24 hr tablet TAKE ONE TABLET BY MOUTH EVERY DAY 90 tablet 1   Glucose Blood (BLOOD GLUCOSE TEST STRIPS) STRP Use to check glucose  twice daily. May substitute to any manufacturer covered by patient's insurance. 100 strip 3   iron polysaccharides (NIFEREX) 150 MG capsule Take 1 capsule (150 mg total) by mouth daily. 90 capsule 0   JARDIANCE 25 MG TABS tablet TAKE 1 TABLET BY MOUTH ONCE DAILY 90 tablet 3   Lancet Device MISC Use to check glucose twice daily. May substitute to any manufacturer covered by patient's insurance. 1 each 1   Lancets Misc. MISC Use to check glucose twice dialy. May substitute to any manufacturer covered by patient's insurance. 100 each 3   lisinopril-hydrochlorothiazide (ZESTORETIC) 10-12.5 MG tablet TAKE 1 TABLET BY MOUTH DAILY 90 tablet 3   metFORMIN (GLUCOPHAGE) 1000 MG tablet Take 1 tablet (1,000 mg total) by mouth 2 (two) times daily with a meal. 90 tablet 1   metoprolol succinate (TOPROL-XL) 50 MG 24 hr tablet TAKE ONE TABLET BY MOUTH ONCE DAILY WITHOR IMMEDIATELY FOLLOWING A MEAL 90 tablet 1   Multiple Vitamin (MULTIVITAMIN) tablet Take 1 tablet by mouth daily.     tirzepatide [facility]) 5 MG/0.5ML Pen Inject 5 mg into the skin once a week. 2 mL 2   traMADol (ULTRAM) 50 MG tablet TAKE ONE TABLET EVERY EIGHT HOURS AS NEEDED FOR MODERATE PAIN 90 tablet 0   No current facility-administered medications on file prior to visit.     ROS see history of present illness  Objective  Physical Exam Vitals:  09/12/22 0953 09/12/22 1051  BP: 134/80 122/80  Pulse: 72   Temp: 98 F (36.7 C)   SpO2: 97%     BP Readings from Last 3 Encounters:  09/12/22 122/80  06/13/22 124/70  04/23/22 (!) 103/58   Wt Readings from Last 3 Encounters:  09/12/22 298 lb 3.2 oz (135.3 kg)  07/04/22 300 lb (136.1 kg)  06/13/22 300 lb (136.1 kg)    Physical Exam Constitutional:      General: He is not in acute distress.    Appearance: He is not diaphoretic.  Cardiovascular:     Rate and Rhythm: Normal rate and regular rhythm.     Heart sounds: Normal heart sounds.  Pulmonary:     Effort: Pulmonary effort  is normal.     Breath sounds: Normal breath sounds.  Skin:    General: Skin is warm and dry.  Neurological:     Mental Status: He is alert.      Assessment/Plan: Please see individual problem list.  Uncontrolled type 2 diabetes mellitus with hyperglycemia, without long-term current use of insulin [name] Assessment & Plan: Chronic issue.  Check A1c.  Continue glipizide 5 mg daily, Jardiance 25 mg daily, metformin 1000 mg twice daily, and Mounjaro 5 mg weekly.  Orders: -     POCT glycosylated hemoglobin (Hb A1C)  Diabetic polyneuropathy associated with type 2 diabetes mellitus [name] Assessment & Plan: Chronic issue.  Adequately controlled.  He will continue gabapentin 300 mg 4 times daily and tramadol 50 mg every 8 hours as needed.   Partial nontraumatic amputation of foot, left [name] Assessment & Plan: Chronic issue.  Patient is back in cast for this.  He will continue to follow with podiatry for this.  Patient does wonder about getting a power scooter or wheelchair given his mobility issues with his left foot issue.  Discussed PT evaluation for this to determine if this is necessary.  Orders: -     Ambulatory referral to Physical Therapy     Return in about 3 months (around 12/13/2022).   Marikay Alar, MD [facility]

## 2022-09-12 NOTE — Assessment & Plan Note (Signed)
Chronic issue.  Adequately controlled.  He will continue gabapentin 300 mg 4 times daily and tramadol 50 mg every 8 hours as needed. ?

## 2022-09-12 NOTE — Patient Instructions (Signed)
PT should contact you.  If you do not hear from them in the next 1 to 2 weeks please let us know.

## 2022-09-12 NOTE — Assessment & Plan Note (Signed)
Chronic issue.  Check A1c.  Continue glipizide 5 mg daily, Jardiance 25 mg daily, metformin 1000 mg twice daily, and Mounjaro 5 mg weekly.

## 2022-10-04 ENCOUNTER — Other Ambulatory Visit: Payer: Self-pay | Admitting: Family Medicine

## 2022-10-04 DIAGNOSIS — E119 Type 2 diabetes mellitus without complications: Secondary | ICD-10-CM

## 2022-10-06 ENCOUNTER — Other Ambulatory Visit: Payer: Self-pay | Admitting: Family Medicine

## 2022-10-06 DIAGNOSIS — E1142 Type 2 diabetes mellitus with diabetic polyneuropathy: Secondary | ICD-10-CM

## 2022-11-03 ENCOUNTER — Other Ambulatory Visit: Payer: Self-pay | Admitting: Family Medicine

## 2022-11-03 DIAGNOSIS — E1142 Type 2 diabetes mellitus with diabetic polyneuropathy: Secondary | ICD-10-CM

## 2022-11-07 NOTE — Telephone Encounter (Signed)
Requesting: Tramadol Contract: No UDS: n/a Last Visit: 09/12/2022 Next Visit: 12/13/2022 Last Refill: 10/12/22  Please Advise

## 2022-12-03 ENCOUNTER — Other Ambulatory Visit: Payer: Self-pay | Admitting: Family Medicine

## 2022-12-03 DIAGNOSIS — E119 Type 2 diabetes mellitus without complications: Secondary | ICD-10-CM

## 2022-12-06 ENCOUNTER — Other Ambulatory Visit: Payer: Self-pay | Admitting: Family Medicine

## 2022-12-06 DIAGNOSIS — E1142 Type 2 diabetes mellitus with diabetic polyneuropathy: Secondary | ICD-10-CM

## 2022-12-06 DIAGNOSIS — I10 Essential (primary) hypertension: Secondary | ICD-10-CM

## 2022-12-07 NOTE — Telephone Encounter (Signed)
Please contact the patient and see if he has enough to last until Friday when I am back in the office. I am having issues signing controlled substances from home.

## 2022-12-13 ENCOUNTER — Encounter: Payer: Self-pay | Admitting: Family Medicine

## 2022-12-13 ENCOUNTER — Ambulatory Visit: Payer: 59 | Admitting: Family Medicine

## 2022-12-13 VITALS — BP 124/68 | HR 68 | Temp 98.4°F | Ht 76.0 in | Wt 299.6 lb

## 2022-12-13 DIAGNOSIS — I10 Essential (primary) hypertension: Secondary | ICD-10-CM | POA: Diagnosis not present

## 2022-12-13 DIAGNOSIS — E785 Hyperlipidemia, unspecified: Secondary | ICD-10-CM

## 2022-12-13 DIAGNOSIS — Z7984 Long term (current) use of oral hypoglycemic drugs: Secondary | ICD-10-CM | POA: Diagnosis not present

## 2022-12-13 DIAGNOSIS — Z7985 Long-term (current) use of injectable non-insulin antidiabetic drugs: Secondary | ICD-10-CM | POA: Diagnosis not present

## 2022-12-13 DIAGNOSIS — E1142 Type 2 diabetes mellitus with diabetic polyneuropathy: Secondary | ICD-10-CM

## 2022-12-13 DIAGNOSIS — E1165 Type 2 diabetes mellitus with hyperglycemia: Secondary | ICD-10-CM | POA: Diagnosis not present

## 2022-12-13 DIAGNOSIS — Z125 Encounter for screening for malignant neoplasm of prostate: Secondary | ICD-10-CM

## 2022-12-13 DIAGNOSIS — Z89432 Acquired absence of left foot: Secondary | ICD-10-CM

## 2022-12-13 LAB — COMPREHENSIVE METABOLIC PANEL
ALT: 12 U/L (ref 0–53)
AST: 11 U/L (ref 0–37)
Albumin: 3.7 g/dL (ref 3.5–5.2)
Alkaline Phosphatase: 112 U/L (ref 39–117)
BUN: 16 mg/dL (ref 6–23)
CO2: 29 meq/L (ref 19–32)
Calcium: 9.3 mg/dL (ref 8.4–10.5)
Chloride: 98 meq/L (ref 96–112)
Creatinine, Ser: 1 mg/dL (ref 0.40–1.50)
GFR: 79.92 mL/min (ref >60.00)
Glucose, Bld: 248 mg/dL — ABNORMAL HIGH (ref 70–99)
Potassium: 4.3 meq/L (ref 3.5–5.1)
Sodium: 135 meq/L (ref 135–145)
Total Bilirubin: 0.8 mg/dL (ref 0.2–1.2)
Total Protein: 6.8 g/dL (ref 6.0–8.3)

## 2022-12-13 LAB — LIPID PANEL
Cholesterol: 117 mg/dL (ref 0–200)
HDL: 30.2 mg/dL — ABNORMAL LOW (ref >39.00)
LDL Cholesterol: 37 mg/dL (ref 0–99)
NonHDL: 86.46
Total CHOL/HDL Ratio: 4
Triglycerides: 245 mg/dL — ABNORMAL HIGH (ref 0.0–149.0)
VLDL: 49 mg/dL — ABNORMAL HIGH (ref 0.0–40.0)

## 2022-12-13 LAB — PSA: PSA: 0.82 ng/mL (ref 0.10–4.00)

## 2022-12-13 LAB — HEMOGLOBIN A1C: Hgb A1c MFr Bld: 7.2 % — ABNORMAL HIGH (ref 4.6–6.5)

## 2022-12-13 NOTE — Assessment & Plan Note (Signed)
Neck issue.  Adequately controlled.  He will continue gabapentin 300 mg 4 times daily and tramadol 50 mg every 8 hours as needed.

## 2022-12-13 NOTE — Assessment & Plan Note (Signed)
Chronic issue.  Adequately controlled.  Patient will continue amlodipine 10 mg daily, lisinopril-HCTZ 1 tablet daily, and metoprolol 50 mg daily.

## 2022-12-13 NOTE — Assessment & Plan Note (Signed)
Chronic issue.  Patient has difficulty with ambulation related to this.  I believe he would benefit from having this to allow him better mobility.  Handwritten prescription for scooter provided.  Discussed if his insurance requires additional documentation on this we will have him see a physical therapist for evaluation.

## 2022-12-13 NOTE — Progress Notes (Signed)
Marikay Alar, MD Phone: [phone]  Joseph Singh is a 62 y.o. male who presents today for f/u.  DIABETES Disease Monitoring: Blood Sugar ranges-not checking Polyuria/phagia/dipsia- no      Optho- due Medications: Compliance- taking glipizide, jardiance, metformin, mounjaro Hypoglycemic symptoms- no  HYPERTENSION Disease Monitoring Home BP Monitoring not checking Chest pain- no    Dyspnea- no Medications Compliance-  taking amlodipine, lisinopril, hydrochlorothiazide, metoprolol.  Edema- no BMET    Component Value Date/Time   NA 137 06/13/2022 1023   K 3.9 06/13/2022 1023   CL 99 06/13/2022 1023   CO2 29 06/13/2022 1023   GLUCOSE 190 (H) 06/13/2022 1023   BUN 13 06/13/2022 1023   CREATININE 0.93 06/13/2022 1023   CREATININE 1.15 09/30/2016 1526   CALCIUM 9.3 06/13/2022 1023   GFRNONAA >60 04/03/2022 0213   GFRAA >60 02/01/2019 1559   Neuropathy: Continues to have symptoms with this that are adequately managed with tramadol and gabapentin.  No drowsiness with those medications.  Partial amputation of left foot: Patient continues to follow with his specialist in [place].  He has wounds on his left ankle and heel.  They are seeing him weekly.  They are working on getting this healed.  He has been fit for a new boot though cannot start wearing this until he has resolution of the wounds.  Notes he has difficulty getting around related to this.  He wonders about getting a scooter to help with this.  Social History   Tobacco Use  Smoking Status Former   Current packs/day: 0.00   Average packs/day: 0.5 packs/day for 35.0 years (17.5 ttl pk-yrs)   Types: Cigarettes   Start date: 08/09/1983   Quit date: 08/09/2018   Years since quitting: 4.3  Smokeless Tobacco Never    Current Outpatient Medications on File Prior to Visit  Medication Sig Dispense Refill   amLODipine (NORVASC) 10 MG tablet Take 1 tablet (10 mg total) by mouth daily. 90 tablet 3   aspirin EC 81 MG  tablet Take 1 tablet (81 mg total) by mouth daily. 150 tablet 2   atorvastatin (LIPITOR) 80 MG tablet Take 1 tablet (80 mg total) by mouth daily. 90 tablet 3   Blood Glucose Monitoring Suppl DEVI Use to check glucose as directed. May substitute to any manufacturer covered by patient's insurance. 1 each 0   clopidogrel (PLAVIX) 75 MG tablet TAKE ONE TABLET BY MOUTH EVERY DAY 90 tablet 3   gabapentin (NEURONTIN) 300 MG capsule TAKE 1 CAPSULE BY MOUTH FOUR TIMES DAILY 360 capsule 2   glipiZIDE (GLUCOTROL XL) 5 MG 24 hr tablet TAKE ONE TABLET BY MOUTH EVERY DAY 90 tablet 1   Glucose Blood (BLOOD GLUCOSE TEST STRIPS) STRP Use to check glucose twice daily. May substitute to any manufacturer covered by patient's insurance. 100 strip 3   iron polysaccharides (NIFEREX) 150 MG capsule Take 1 capsule (150 mg total) by mouth daily. 90 capsule 0   JARDIANCE 25 MG TABS tablet TAKE 1 TABLET BY MOUTH ONCE DAILY 90 tablet 3   Lancet Device MISC Use to check glucose twice daily. May substitute to any manufacturer covered by patient's insurance. 1 each 1   Lancets Misc. MISC Use to check glucose twice dialy. May substitute to any manufacturer covered by patient's insurance. 100 each 3   lisinopril-hydrochlorothiazide (ZESTORETIC) 10-12.5 MG tablet TAKE 1 TABLET BY MOUTH DAILY 90 tablet 3   metFORMIN (GLUCOPHAGE) 1000 MG tablet TAKE ONE (1) TABLET BY MOUTH TWO TIMES PER  DAY WITH A MEAL 180 tablet 1   metoprolol succinate (TOPROL-XL) 50 MG 24 hr tablet TAKE ONE TABLET BY MOUTH ONCE DAILY WITHOR IMMEDIATELY FOLLOWING A MEAL 90 tablet 1   Multiple Vitamin (MULTIVITAMIN) tablet Take 1 tablet by mouth daily.     tirzepatide [facility]) 5 MG/0.5ML Pen Inject 5 mg into the skin once a week. 2 mL 2   traMADol (ULTRAM) 50 MG tablet TAKE ONE TABLET EVERY EIGHT HOURS AS NEEDED FOR MODERATE PAIN 90 tablet 0   No current facility-administered medications on file prior to visit.     ROS see history of present  illness  Objective  Physical Exam Vitals:   12/13/22 0937  BP: 124/68  Pulse: 68  Temp: 98.4 F (36.9 C)  SpO2: 95%    BP Readings from Last 3 Encounters:  12/13/22 124/68  09/12/22 122/80  06/13/22 124/70   Wt Readings from Last 3 Encounters:  12/13/22 299 lb 9.6 oz (135.9 kg)  09/12/22 298 lb 3.2 oz (135.3 kg)  07/04/22 300 lb (136.1 kg)    Physical Exam Constitutional:      General: He is not in acute distress.    Appearance: He is not diaphoretic.  Cardiovascular:     Rate and Rhythm: Normal rate and regular rhythm.     Heart sounds: Normal heart sounds.  Pulmonary:     Effort: Pulmonary effort is normal.     Breath sounds: Normal breath sounds.  Skin:    General: Skin is warm and dry.  Neurological:     Mental Status: He is alert.      Assessment/Plan: Please see individual problem list.  Partial nontraumatic amputation of foot, left [name] Assessment & Plan: Chronic issue.  Patient has difficulty with ambulation related to this.  I believe he would benefit from having this to allow him better mobility.  Handwritten prescription for scooter provided.  Discussed if his insurance requires additional documentation on this we will have him see a physical therapist for evaluation.   Essential hypertension Assessment & Plan: Chronic issue.  Adequately controlled.  Patient will continue amlodipine 10 mg daily, lisinopril-HCTZ 1 tablet daily, and metoprolol 50 mg daily.  Orders: -     Comprehensive metabolic panel  Diabetic polyneuropathy associated with type 2 diabetes mellitus [name] Assessment & Plan: Neck issue.  Adequately controlled.  He will continue gabapentin 300 mg 4 times daily and tramadol 50 mg every 8 hours as needed.   Uncontrolled type 2 diabetes mellitus with hyperglycemia, without long-term current use of insulin [name] Assessment & Plan: Chronic issue.  Check A1c.  Patient will continue glipizide 5 mg daily, Jardiance 25 mg daily, metformin  1000 mg twice daily, and Mounjaro 5 mg weekly.  Orders: -     Comprehensive metabolic panel -     Hemoglobin A1c  Hyperlipidemia, unspecified hyperlipidemia type -     Comprehensive metabolic panel -     Lipid panel  Prostate cancer screening -     PSA     Health Maintenance: Patient was encouraged to contact his eye doctor for a yearly visit.  Return in about 3 months (around 03/15/2023) for Transfer of care.   Marikay Alar, MD [facility]

## 2022-12-13 NOTE — Assessment & Plan Note (Signed)
Chronic issue.  Check A1c.  Patient will continue glipizide 5 mg daily, Jardiance 25 mg daily, metformin 1000 mg twice daily, and Mounjaro 5 mg weekly.

## 2022-12-13 NOTE — Patient Instructions (Signed)
Nice to see you. Please let us know if you have any issues with getting the power scooter.

## 2022-12-14 ENCOUNTER — Other Ambulatory Visit: Payer: Self-pay | Admitting: *Deleted

## 2022-12-14 MED ORDER — TIRZEPATIDE 7.5 MG/0.5ML ~~LOC~~ SOAJ: 7.5000 mg | SUBCUTANEOUS | 2 refills | Status: DC

## 2023-01-09 ENCOUNTER — Other Ambulatory Visit: Payer: Self-pay | Admitting: Family Medicine

## 2023-01-09 DIAGNOSIS — E1142 Type 2 diabetes mellitus with diabetic polyneuropathy: Secondary | ICD-10-CM

## 2023-02-01 ENCOUNTER — Ambulatory Visit (INDEPENDENT_AMBULATORY_CARE_PROVIDER_SITE_OTHER): Payer: 59 | Admitting: Family

## 2023-02-01 ENCOUNTER — Encounter: Payer: Self-pay | Admitting: Family

## 2023-02-01 VITALS — BP 136/78 | HR 79 | Temp 97.9°F | Ht 76.0 in | Wt 305.9 lb

## 2023-02-01 DIAGNOSIS — M542 Cervicalgia: Secondary | ICD-10-CM | POA: Diagnosis not present

## 2023-02-01 MED ORDER — CYCLOBENZAPRINE HCL 5 MG PO TABS
5.0000 mg | ORAL_TABLET | Freq: Three times a day (TID) | ORAL | 1 refills | Status: DC | PRN
Start: 2023-02-01 — End: 2023-05-04

## 2023-02-01 MED ORDER — DULOXETINE HCL 30 MG PO CPEP
30.0000 mg | ORAL_CAPSULE | Freq: Every day | ORAL | 0 refills | Status: DC
Start: 2023-02-01 — End: 2023-03-23

## 2023-02-01 NOTE — Patient Instructions (Signed)
I suspect you have a muscle spasm and very likely underlying neck disease including arthritic changes.  I am concerned with the numbness in your hands and would recommend consideration for an MRI of your cervical spine. For now, we will start with muscle relaxant Flexeril.  Please continue heat at least 20 minutes 3 times daily.  Gentle stretching.  Will also start a non opioid medication called Cymbalta use for chronic pain as well as depression   Please schedule Tylenol arthritis 1 tablet in the morning and 1 tablet in the evening.  Nice to meet you!

## 2023-02-01 NOTE — Progress Notes (Unsigned)
Assessment & Plan:  Neck pain Assessment & Plan: Chronic pain, permanently disabled low back pain, left foot amputation.  Anticipate arthritic changes with muscle spasm.   Counseled on serotonin syndrome symptoms to stay vigilant for.  We agreed trial of very low-dose of Cymbalta.  Patient will continue tramadol total of 150 mg daily.  Advised would not increase tramadol while he is on Cymbalta.  Start Flexeril 5 to 10 mg 3 times daily.  Continue gabapentin 300mg  QID.  He politely declines imaging at this time which I think is reasonable.  If pain persists, advised baseline shoulder and cervical spine x-ray.  Consider EMG study for carpal tunnel evaluation.  Orders: -     Cyclobenzaprine HCl; Take 1-2 tablets (5-10 mg total) by mouth 3 (three) times daily as needed for muscle spasms.  Dispense: 30 tablet; Refill: 1 -     DULoxetine HCl; Take 1 capsule (30 mg total) by mouth daily.  Dispense: 90 capsule; Refill: 0     Return precautions given.   Risks, benefits, and alternatives of the medications and treatment plan prescribed today were discussed, and patient expressed understanding.   Education regarding symptom management and diagnosis given to patient on AVS either electronically or printed.  Return in about 1 month (around 03/04/2023).  Rennie Plowman, FNP  Subjective:    Patient ID: Joseph Singh, male    DOB: 1960-08-12, 62 y.o.   MRN: [id number]  CC: Joseph Singh is a 62 y.o. male who presents today for an acute visit.    HPI: Complains of left sided neck and shoulder pain x 7 days, unchanged.   Describes as a sharp pain.    Episodic numbness from neck to bilateral hands in the 3rd , 4th, and 5th fingers. Numbness is not continuous from neck to hands; this is chronic.    No pain with lifting arm or getting dressed   No injury, swelling, rash      Using ice and heat with minimal relief.   He is taking tramadol 50mg  q8 hrs total of 150mg  QD, gabapentin 300mg   four times daily daily without relief.    H/o DM, PVD, HTN  Former smoker  Permanently disabled from fall d/t left foot amputation, historically low back pain.    History of right rotator cuff repair in 2012  No ho ckd A1c 7.2   Allergies: Patient has no known allergies. Current Outpatient Medications on File Prior to Visit  Medication Sig Dispense Refill   amLODipine (NORVASC) 10 MG tablet Take 1 tablet (10 mg total) by mouth daily. 90 tablet 3   aspirin EC 81 MG tablet Take 1 tablet (81 mg total) by mouth daily. 150 tablet 2   atorvastatin (LIPITOR) 80 MG tablet Take 1 tablet (80 mg total) by mouth daily. 90 tablet 3   Blood Glucose Monitoring Suppl DEVI Use to check glucose as directed. May substitute to any manufacturer covered by patient's insurance. 1 each 0   clopidogrel (PLAVIX) 75 MG tablet TAKE ONE TABLET BY MOUTH EVERY DAY 90 tablet 3   gabapentin (NEURONTIN) 300 MG capsule TAKE 1 CAPSULE BY MOUTH FOUR TIMES DAILY 360 capsule 2   glipiZIDE (GLUCOTROL XL) 5 MG 24 hr tablet TAKE ONE TABLET BY MOUTH EVERY DAY 90 tablet 1   Glucose Blood (BLOOD GLUCOSE TEST STRIPS) STRP Use to check glucose twice daily. May substitute to any manufacturer covered by patient's insurance. 100 strip 3   iron polysaccharides (NIFEREX) 150 MG capsule  Take 1 capsule (150 mg total) by mouth daily. 90 capsule 0   JARDIANCE 25 MG TABS tablet TAKE 1 TABLET BY MOUTH ONCE DAILY 90 tablet 3   Lancet Device MISC Use to check glucose twice daily. May substitute to any manufacturer covered by patient's insurance. 1 each 1   Lancets Misc. MISC Use to check glucose twice dialy. May substitute to any manufacturer covered by patient's insurance. 100 each 3   lisinopril-hydrochlorothiazide (ZESTORETIC) 10-12.5 MG tablet TAKE 1 TABLET BY MOUTH DAILY 90 tablet 3   metFORMIN (GLUCOPHAGE) 1000 MG tablet TAKE ONE (1) TABLET BY MOUTH TWO TIMES PER DAY WITH A MEAL 180 tablet 1   metoprolol succinate (TOPROL-XL) 50 MG 24  hr tablet TAKE ONE TABLET BY MOUTH ONCE DAILY WITHOR IMMEDIATELY FOLLOWING A MEAL 90 tablet 1   Multiple Vitamin (MULTIVITAMIN) tablet Take 1 tablet by mouth daily.     tirzepatide (MOUNJARO) 7.5 MG/0.5ML Pen Inject 7.5 mg into the skin once a week. 6 mL 2   traMADol (ULTRAM) 50 MG tablet TAKE ONE TABLET EVERY EIGHT HOURS AS NEEDED FOR MODERATE PAIN 90 tablet 0   No current facility-administered medications on file prior to visit.    Review of Systems  Constitutional:  Negative for chills and fever.  Respiratory:  Negative for cough.   Cardiovascular:  Negative for chest pain and palpitations.  Gastrointestinal:  Negative for nausea and vomiting.  Musculoskeletal:  Positive for neck pain.  Skin:  Negative for rash.  Neurological:  Positive for numbness.      Objective:    BP 136/78   Pulse 79   Temp 97.9 F (36.6 C) (Oral)   Ht 6\' 4"  (1.93 m)   Wt (!) 305 lb 14.1 oz (138.7 kg)   SpO2 98%   BMI 37.23 kg/m   BP Readings from Last 3 Encounters:  02/01/23 136/78  12/13/22 124/68  09/12/22 122/80   Wt Readings from Last 3 Encounters:  02/01/23 (!) 305 lb 14.1 oz (138.7 kg)  12/13/22 299 lb 9.6 oz (135.9 kg)  09/12/22 298 lb 3.2 oz (135.3 kg)      02/01/2023    8:28 AM 12/13/2022    9:46 AM 09/12/2022   10:14 AM  Depression screen PHQ 2/9  Decreased Interest 0 0 0  Down, Depressed, Hopeless 0 0 0  PHQ - 2 Score 0 0 0  Altered sleeping 0 0 0  Tired, decreased energy 0 0 0  Change in appetite 0 0 0  Feeling bad or failure about yourself  0 0 0  Trouble concentrating 0 0 0  Moving slowly or fidgety/restless 0 0 0  Suicidal thoughts 0 0 0  PHQ-9 Score 0 0 0  Difficult doing work/chores Not difficult at all Not difficult at all Not difficult at all     Physical Exam Vitals reviewed.  Constitutional:      Appearance: He is well-developed.  Cardiovascular:     Rate and Rhythm: Regular rhythm.     Heart sounds: Normal heart sounds.  Pulmonary:     Effort:  Pulmonary effort is normal. No respiratory distress.     Breath sounds: Normal breath sounds. No wheezing, rhonchi or rales.  Musculoskeletal:     Left shoulder: Bony tenderness present. No swelling. Normal range of motion.       Arms:     Cervical back: Spasms and tenderness present. No swelling, rigidity or torticollis. Pain with movement present. Normal range of motion.  Back:     Comments: Left Shoulder:   No asymmetry of shoulders when comparing right and left. Pain with palpation over AC joint. No pain with internal and external rotation. No pain with resisted lateral extension .   Strength and sensation normal BUE's.   Skin:    General: Skin is warm and dry.  Neurological:     Mental Status: He is alert.  Psychiatric:        Speech: Speech normal.        Behavior: Behavior normal.

## 2023-02-01 NOTE — Assessment & Plan Note (Signed)
Chronic pain, permanently disabled low back pain, left foot amputation.  Anticipate arthritic changes with muscle spasm.   Counseled on serotonin syndrome symptoms to stay vigilant for.  We agreed trial of very low-dose of Cymbalta.  Patient will continue tramadol total of 150 mg daily.  Advised would not increase tramadol while he is on Cymbalta.  Start Flexeril 5 to 10 mg 3 times daily.  Continue gabapentin 300mg  QID.  He politely declines imaging at this time which I think is reasonable.  If pain persists, advised baseline shoulder and cervical spine x-ray.  Consider EMG study for carpal tunnel evaluation.

## 2023-02-08 ENCOUNTER — Other Ambulatory Visit: Payer: Self-pay | Admitting: Family Medicine

## 2023-02-08 DIAGNOSIS — E1142 Type 2 diabetes mellitus with diabetic polyneuropathy: Secondary | ICD-10-CM

## 2023-02-28 ENCOUNTER — Ambulatory Visit: Payer: 59 | Admitting: Family

## 2023-03-01 ENCOUNTER — Other Ambulatory Visit: Payer: Self-pay | Admitting: Family Medicine

## 2023-03-01 ENCOUNTER — Ambulatory Visit: Payer: 59 | Admitting: Family Medicine

## 2023-03-01 DIAGNOSIS — E119 Type 2 diabetes mellitus without complications: Secondary | ICD-10-CM

## 2023-03-06 ENCOUNTER — Other Ambulatory Visit: Payer: Self-pay | Admitting: Family

## 2023-03-06 DIAGNOSIS — E1142 Type 2 diabetes mellitus with diabetic polyneuropathy: Secondary | ICD-10-CM

## 2023-03-06 MED ORDER — TRAMADOL HCL 50 MG PO TABS: ORAL_TABLET | ORAL | 0 refills | Status: DC

## 2023-03-06 NOTE — Telephone Encounter (Signed)
 Copied from CRM [id number]. Topic: Clinical - Medication Refill >> Mar 06, 2023  1:31 PM [name] wrote: Most Recent Primary Care Visit:  Provider: DINEEN ROLLENE MATSU  Department: [facility]  Visit Type: OFFICE VISIT  Date: 02/01/2023  Medication:  traMADol  (ULTRAM ) 50 MG tablet    Has the patient contacted their pharmacy? Yes (Agent: If no, request that the patient contact the pharmacy for the refill. If patient does not wish to contact the pharmacy document the reason why and proceed with request.) (Agent: If yes, when and what did the pharmacy advise?)  Is this the correct pharmacy for this prescription? Yes If no, delete pharmacy and type the correct one.  This is the patient's preferred pharmacy:  [facility] - [place], [place] - [name] [name] [address] Phone: [phone] Fax: [phone]   Has the prescription been filled recently? Yes some where aro  Is the patient out of the medication? No  Has the patient been seen for an appointment in the last year OR does the patient have an upcoming appointment? Yes  Can we respond through MyChart? Yes  Agent: Please be advised that Rx refills may take up to 3 business days. We ask that you follow-up with your pharmacy.

## 2023-03-08 ENCOUNTER — Ambulatory Visit: Payer: 59 | Admitting: Family

## 2023-03-23 ENCOUNTER — Encounter: Payer: Self-pay | Admitting: Family

## 2023-03-23 ENCOUNTER — Ambulatory Visit (INDEPENDENT_AMBULATORY_CARE_PROVIDER_SITE_OTHER): Payer: 59 | Admitting: Family

## 2023-03-23 VITALS — BP 130/70 | HR 89 | Temp 98.5°F | Resp 18 | Ht 76.0 in | Wt 288.1 lb

## 2023-03-23 DIAGNOSIS — M542 Cervicalgia: Secondary | ICD-10-CM

## 2023-03-23 DIAGNOSIS — J40 Bronchitis, not specified as acute or chronic: Secondary | ICD-10-CM | POA: Diagnosis not present

## 2023-03-23 DIAGNOSIS — Z7984 Long term (current) use of oral hypoglycemic drugs: Secondary | ICD-10-CM

## 2023-03-23 DIAGNOSIS — E1142 Type 2 diabetes mellitus with diabetic polyneuropathy: Secondary | ICD-10-CM | POA: Diagnosis not present

## 2023-03-23 DIAGNOSIS — Z7985 Long-term (current) use of injectable non-insulin antidiabetic drugs: Secondary | ICD-10-CM

## 2023-03-23 DIAGNOSIS — E1165 Type 2 diabetes mellitus with hyperglycemia: Secondary | ICD-10-CM | POA: Diagnosis not present

## 2023-03-23 LAB — MICROALBUMIN / CREATININE URINE RATIO
Creatinine,U: 67 mg/dL
Microalb Creat Ratio: 1.1 mg/g (ref 0.0–30.0)
Microalb, Ur: 0.7 mg/dL (ref 0.0–1.9)

## 2023-03-23 LAB — POCT GLYCOSYLATED HEMOGLOBIN (HGB A1C): Hemoglobin A1C: 8.1 % — AB (ref 4.0–5.6)

## 2023-03-23 MED ORDER — TIRZEPATIDE 10 MG/0.5ML ~~LOC~~ SOAJ: 10.0000 mg | SUBCUTANEOUS | 2 refills | Status: DC

## 2023-03-23 MED ORDER — AZITHROMYCIN 250 MG PO TABS: ORAL_TABLET | ORAL | 0 refills | Status: AC

## 2023-03-23 MED ORDER — TRAMADOL HCL 50 MG PO TABS: ORAL_TABLET | ORAL | 1 refills | Status: DC

## 2023-03-23 NOTE — Patient Instructions (Addendum)
   Suspect viral infection  Start mucinex DM  If cough and congestion doesn't improve, start antibiotic , zpak  Ensure to take probiotics while on antibiotics and also for 2 weeks after completion. This can either be by eating yogurt daily or taking a probiotic supplement over the counter such as Culturelle.It is important to re-colonize the gut with good bacteria and also to prevent any diarrheal infections associated with antibiotic use.   Let me know if does not completely resolve.  Blood sugar elevated today.  We discussed increasing Mounjaro from 7.5 to 10 mg once weekly.  Please monitor for increased nausea.  I suspect nausea may be related to congestion however as discussed Mounjaro most certainly can cause nausea.  Please let me know if decreased appetite and nausea does not resolve and do not increase Mounjaro until you are feeling well.

## 2023-03-23 NOTE — Progress Notes (Signed)
Assessment & Plan:  Bronchitis Assessment & Plan: Afebrile.  No acute respiratory distress.  Discussed duration of symptoms and advised more likely to be viral in etiology.  Advised to start Mucinex DM.  If symptoms persist, or worsen advised to start azithromycin with probiotic.  Orders: -     Azithromycin; Take 2 tablets on day 1, then 1 tablet daily on days 2 through 5  Dispense: 6 tablet; Refill: 0  Diabetic polyneuropathy associated with type 2 diabetes mellitus [name] -     Microalbumin / creatinine urine ratio -     POCT glycosylated hemoglobin (Hb A1C) -     Tirzepatide; Inject 10 mg into the skin once a week.  Dispense: 6 mL; Refill: 2  Neck pain -     traMADol HCl; TAKE ONE TABLET EVERY EIGHT HOURS AS NEEDED FOR MODERATE PAIN  Dispense: 90 tablet; Refill: 1  Uncontrolled type 2 diabetes mellitus with hyperglycemia, without long-term current use of insulin [name] Assessment & Plan: Uncontrolled.  Discussed at length with patient ongoing nausea.  He feels this is related to congestion versus side effect with Mounjaro.  He has done well Mounjaro previously.  He is interested in increasing medication.  Advised he may increase mounjaro to 10 mg once weekly however if nausea were to persist, he would need to let us know right away. Continue glipizide 5 mg daily, Jardiance 25 mg daily, metformin 1000 mg twice daily,       Return precautions given.   Risks, benefits, and alternatives of the medications and treatment plan prescribed today were discussed, and patient expressed understanding.   Education regarding symptom management and diagnosis given to patient on AVS either electronically or printed.  No follow-ups on file.  Rennie Plowman, FNP  Subjective:    Patient ID: [name], male    DOB: 09-Jul-1960, 63 y.o.   MRN: [id number]  CC: AIVAN FILLINGIM is a 63 y.o. male who presents today for follow up.   HPI: Complains of productive cough x 11 days, waxes and  wanes Cough more persistent in the morning and then goes away in afternoon.   Endorses clear thin nasal congestion, decreased appetite, and nausea without vomiting. Nausea is not related to eating. Thinks nausea is r/t congestion and PND.   No sob, wheezing, leg swelling, sinus pain, ear pain, sore throat  Fever 104F 8 days ago without recurrence  Negative covid at home one week ago.   He has not tried any medication for this  Wife had similar symptoms at home and one day of fever at home  He has had intermittent nausea for the past 8 weeks. No vomiting    NO recent abx.  Former smoker  No ckd  Compliant with glipizide 5 mg daily, Jardiance 25 mg daily, metformin 1000 mg twice daily, Mounjaro 7.5 mg daily.  He is tolerating Mounjaro quite well.  He has not had  nausea previously with mounjaro.   Denies constipation  Transfer care appointment with Bethanie Dicker 04/2023 Request refill of tramadol.  He is taking tramadol 150 mg daily.  Compliant with Flexeril 5 to 10 mg 3 times daily, gabapentin 300 mg 4 times daily.  He stopped Cymbalta due to nausea.  Allergies: Cymbalta [duloxetine hcl] Current Outpatient Medications on File Prior to Visit  Medication Sig Dispense Refill   amLODipine (NORVASC) 10 MG tablet Take 1 tablet (10 mg total) by mouth daily. 90 tablet 3   aspirin EC 81 MG tablet  Take 1 tablet (81 mg total) by mouth daily. 150 tablet 2   atorvastatin (LIPITOR) 80 MG tablet Take 1 tablet (80 mg total) by mouth daily. 90 tablet 3   Blood Glucose Monitoring Suppl DEVI Use to check glucose as directed. May substitute to any manufacturer covered by patient's insurance. 1 each 0   clopidogrel (PLAVIX) 75 MG tablet TAKE ONE TABLET BY MOUTH EVERY DAY 90 tablet 3   cyclobenzaprine (FLEXERIL) 5 MG tablet Take 1-2 tablets (5-10 mg total) by mouth 3 (three) times daily as needed for muscle spasms. 30 tablet 1   gabapentin (NEURONTIN) 300 MG capsule TAKE 1 CAPSULE BY MOUTH FOUR TIMES  DAILY 360 capsule 2   glipiZIDE (GLUCOTROL XL) 5 MG 24 hr tablet TAKE ONE TABLET BY MOUTH EVERY DAY 90 tablet 1   Glucose Blood (BLOOD GLUCOSE TEST STRIPS) STRP Use to check glucose twice daily. May substitute to any manufacturer covered by patient's insurance. 100 strip 3   iron polysaccharides (NIFEREX) 150 MG capsule Take 1 capsule (150 mg total) by mouth daily. 90 capsule 0   JARDIANCE 25 MG TABS tablet TAKE 1 TABLET BY MOUTH ONCE DAILY 90 tablet 3   Lancet Device MISC Use to check glucose twice daily. May substitute to any manufacturer covered by patient's insurance. 1 each 1   Lancets Misc. MISC Use to check glucose twice dialy. May substitute to any manufacturer covered by patient's insurance. 100 each 3   lisinopril-hydrochlorothiazide (ZESTORETIC) 10-12.5 MG tablet TAKE 1 TABLET BY MOUTH DAILY 90 tablet 3   metFORMIN (GLUCOPHAGE) 1000 MG tablet TAKE ONE (1) TABLET BY MOUTH TWO TIMES PER DAY WITH A MEAL 180 tablet 1   metoprolol succinate (TOPROL-XL) 50 MG 24 hr tablet TAKE ONE TABLET BY MOUTH ONCE DAILY WITHOR IMMEDIATELY FOLLOWING A MEAL 90 tablet 1   Multiple Vitamin (MULTIVITAMIN) tablet Take 1 tablet by mouth daily.     No current facility-administered medications on file prior to visit.    Review of Systems  Constitutional:  Negative for chills and fever (resolved).  HENT:  Positive for congestion and postnasal drip.   Respiratory:  Positive for cough. Negative for shortness of breath and wheezing.   Cardiovascular:  Negative for chest pain and palpitations.  Gastrointestinal:  Positive for nausea. Negative for abdominal pain, constipation and vomiting.      Objective:    BP 130/70   Pulse 89   Temp 98.5 F (36.9 C)   Resp 18   Ht 6\' 4"  (1.93 m)   Wt 288 lb 2 oz (130.7 kg)   BMI 35.07 kg/m  BP Readings from Last 3 Encounters:  03/23/23 130/70  02/01/23 136/78  12/13/22 124/68   Wt Readings from Last 3 Encounters:  03/23/23 288 lb 2 oz (130.7 kg)  02/01/23 (!)  305 lb 14.1 oz (138.7 kg)  12/13/22 299 lb 9.6 oz (135.9 kg)    Physical Exam Vitals reviewed.  Constitutional:      Appearance: He is well-developed.  HENT:     Head: Normocephalic and atraumatic.     Right Ear: Hearing, tympanic membrane, ear canal and external ear normal. No decreased hearing noted. No drainage, swelling or tenderness. No middle ear effusion. Tympanic membrane is not injected, erythematous or bulging.     Left Ear: Hearing, tympanic membrane, ear canal and external ear normal. No decreased hearing noted. No drainage, swelling or tenderness.  No middle ear effusion. Tympanic membrane is not injected, erythematous or bulging.  Nose: Nose normal.     Right Sinus: No maxillary sinus tenderness or frontal sinus tenderness.     Left Sinus: No maxillary sinus tenderness or frontal sinus tenderness.     Mouth/Throat:     Pharynx: Uvula midline. No oropharyngeal exudate or posterior oropharyngeal erythema.     Tonsils: No tonsillar abscesses.  Eyes:     Conjunctiva/sclera: Conjunctivae normal.  Cardiovascular:     Rate and Rhythm: Regular rhythm.     Heart sounds: Normal heart sounds.  Pulmonary:     Effort: Pulmonary effort is normal. No respiratory distress.     Breath sounds: Normal breath sounds. No wheezing, rhonchi or rales.  Lymphadenopathy:     Head:     Right side of head: No submental, submandibular, tonsillar, preauricular, posterior auricular or occipital adenopathy.     Left side of head: No submental, submandibular, tonsillar, preauricular, posterior auricular or occipital adenopathy.     Cervical: No cervical adenopathy.  Skin:    General: Skin is warm and dry.  Neurological:     Mental Status: He is alert.  Psychiatric:        Speech: Speech normal.        Behavior: Behavior normal.

## 2023-03-23 NOTE — Assessment & Plan Note (Signed)
Uncontrolled.  Discussed at length with patient ongoing nausea.  He feels this is related to congestion versus side effect with Mounjaro.  He has done well Mounjaro previously.  He is interested in increasing medication.  Advised he may increase mounjaro to 10 mg once weekly however if nausea were to persist, he would need to let us know right away. Continue glipizide 5 mg daily, Jardiance 25 mg daily, metformin 1000 mg twice daily,

## 2023-03-23 NOTE — Assessment & Plan Note (Signed)
Afebrile.  No acute respiratory distress.  Discussed duration of symptoms and advised more likely to be viral in etiology.  Advised to start Mucinex DM.  If symptoms persist, or worsen advised to start azithromycin with probiotic.

## 2023-03-25 HISTORY — PX: ANKLE SURGERY: SHX546

## 2023-03-27 ENCOUNTER — Encounter: Payer: Self-pay | Admitting: Family

## 2023-03-28 ENCOUNTER — Other Ambulatory Visit: Payer: Self-pay | Admitting: Family

## 2023-03-28 ENCOUNTER — Encounter: Payer: Self-pay | Admitting: Family

## 2023-03-28 DIAGNOSIS — J40 Bronchitis, not specified as acute or chronic: Secondary | ICD-10-CM

## 2023-03-28 MED ORDER — GUAIFENESIN-CODEINE 100-10 MG/5ML PO SOLN: 5.0000 mL | Freq: Every evening | ORAL | 0 refills | Status: DC | PRN

## 2023-03-29 ENCOUNTER — Telehealth: Payer: Self-pay

## 2023-03-29 NOTE — Telephone Encounter (Signed)
 Copied from CRM [id number]. Topic: Clinical - Medical Advice >> Mar 29, 2023  2:59 PM Taleah C wrote: Reason for CRM: pt called and wanted clarification on where his  medical mobility scooter was sent too that Dr. Maribeth ordered for him. Please call and advise [phone] .

## 2023-03-29 NOTE — Telephone Encounter (Signed)
 I didn't see any notes where this was order.

## 2023-03-29 NOTE — Telephone Encounter (Signed)
 Spoke to pt. Pt stated that he still has the prescription and needed to know where to take it. Informed pt there was [facility] located in [place] that he could try there. Pt verbalized understanding and stated that he would let us  know if they wouldn't accept it

## 2023-03-29 NOTE — Telephone Encounter (Signed)
 I handed the patient a hand written prescription for this in October per my note. I believe he was to take this to a medical supply store to get the scooter.

## 2023-03-30 ENCOUNTER — Other Ambulatory Visit: Payer: Self-pay | Admitting: Family Medicine

## 2023-03-30 DIAGNOSIS — I10 Essential (primary) hypertension: Secondary | ICD-10-CM

## 2023-04-07 ENCOUNTER — Telehealth: Payer: Self-pay

## 2023-04-07 NOTE — Telephone Encounter (Signed)
Patient needs an appointment for clearance. Please get this scheduled.

## 2023-04-07 NOTE — Telephone Encounter (Signed)
Received clearance surgical from [facility]. Placed in folder for review

## 2023-04-07 NOTE — Telephone Encounter (Signed)
Patient notified and made aware of Dr Purvis Sheffield recommendations prior to surgery. Patient says his surgery is set for 04/18/23. Patient is scheduled with provider on Monday 04/10/23 at 8:20 AM. Patient verbalized understanding and has no further questions at this time.

## 2023-04-10 ENCOUNTER — Ambulatory Visit (INDEPENDENT_AMBULATORY_CARE_PROVIDER_SITE_OTHER): Payer: 59 | Admitting: Family Medicine

## 2023-04-10 ENCOUNTER — Encounter: Payer: Self-pay | Admitting: Family Medicine

## 2023-04-10 VITALS — BP 118/68 | HR 76 | Temp 97.9°F | Resp 16 | Ht 76.0 in | Wt 290.0 lb

## 2023-04-10 DIAGNOSIS — Z01818 Encounter for other preprocedural examination: Secondary | ICD-10-CM | POA: Insufficient documentation

## 2023-04-10 DIAGNOSIS — E1165 Type 2 diabetes mellitus with hyperglycemia: Secondary | ICD-10-CM

## 2023-04-10 DIAGNOSIS — Z7985 Long-term (current) use of injectable non-insulin antidiabetic drugs: Secondary | ICD-10-CM | POA: Diagnosis not present

## 2023-04-10 DIAGNOSIS — E785 Hyperlipidemia, unspecified: Secondary | ICD-10-CM

## 2023-04-10 DIAGNOSIS — Z125 Encounter for screening for malignant neoplasm of prostate: Secondary | ICD-10-CM

## 2023-04-10 DIAGNOSIS — M14672 Charcot's joint, left ankle and foot: Secondary | ICD-10-CM | POA: Diagnosis not present

## 2023-04-10 DIAGNOSIS — I10 Essential (primary) hypertension: Secondary | ICD-10-CM

## 2023-04-10 DIAGNOSIS — Z7984 Long term (current) use of oral hypoglycemic drugs: Secondary | ICD-10-CM

## 2023-04-10 LAB — BASIC METABOLIC PANEL
BUN: 14 mg/dL (ref 6–23)
CO2: 27 meq/L (ref 19–32)
Calcium: 9.2 mg/dL (ref 8.4–10.5)
Chloride: 96 meq/L (ref 96–112)
Creatinine, Ser: 0.82 mg/dL (ref 0.40–1.50)
GFR: 93.06 mL/min (ref >60.00)
Glucose, Bld: 153 mg/dL — ABNORMAL HIGH (ref 70–99)
Potassium: 3.9 meq/L (ref 3.5–5.1)
Sodium: 132 meq/L — ABNORMAL LOW (ref 135–145)

## 2023-04-10 LAB — CBC
HCT: 44.3 % (ref 39.0–52.0)
Hemoglobin: 14.9 g/dL (ref 13.0–17.0)
MCHC: 33.6 g/dL (ref 30.0–36.0)
MCV: 91.4 fL (ref 78.0–100.0)
Platelets: 287 10*3/uL (ref 150.0–400.0)
RBC: 4.85 Mil/uL (ref 4.22–5.81)
RDW: 15.2 % (ref 11.5–15.5)
WBC: 5.5 10*3/uL (ref 4.0–10.5)

## 2023-04-10 NOTE — Assessment & Plan Note (Signed)
Uncontrolled.  Checking glucose today.  Discussed that his elevated A1c may cause the surgeon to delay his surgery.  Patient will increase to Mounjaro 10 mg weekly once he finishes his 7.5 mg supply.  He will also continue Jardiance 25 mg daily, glipizide 5 mg daily, and metformin 1000 mg twice daily.

## 2023-04-10 NOTE — Assessment & Plan Note (Signed)
Chronic issue.  Patient is scheduled to have ankle surgery.

## 2023-04-10 NOTE — Progress Notes (Signed)
Marikay Alar, MD Phone: [phone]  Joseph Singh is a 63 y.o. male who presents today for f/u.  DIABETES Disease Monitoring: Blood Sugar ranges-not checking Polyuria/phagia/dipsia- no      Medications: Compliance- taking jardiance, metformin, glipizide, mounjaro, has not increased mounjaro to 10 mg yet, still on 7.5 mg dose to use up his supply Hypoglycemic symptoms- notes occasional jittery sensation if he does not eat  Preop visit: Patient is going to have a left ankle fusion.  He notes no chest pain or shortness of breath when climbing stairs.  He does note his left foot and boot limits how much activity he can do.  He quit smoking 3 years ago.  No alcohol or drug use.   Social History   Tobacco Use  Smoking Status Former   Current packs/day: 0.00   Average packs/day: 0.5 packs/day for 35.0 years (17.5 ttl pk-yrs)   Types: Cigarettes   Start date: 08/09/1983   Quit date: 08/09/2018   Years since quitting: 4.6  Smokeless Tobacco Never    Current Outpatient Medications on File Prior to Visit  Medication Sig Dispense Refill   amLODipine (NORVASC) 10 MG tablet TAKE 1 TABLET BY MOUTH ONCE DAILY 90 tablet 0   aspirin EC 81 MG tablet Take 1 tablet (81 mg total) by mouth daily. 150 tablet 2   atorvastatin (LIPITOR) 80 MG tablet Take 1 tablet (80 mg total) by mouth daily. 90 tablet 3   Blood Glucose Monitoring Suppl DEVI Use to check glucose as directed. May substitute to any manufacturer covered by patient's insurance. 1 each 0   clopidogrel (PLAVIX) 75 MG tablet TAKE ONE TABLET BY MOUTH EVERY DAY 90 tablet 3   cyclobenzaprine (FLEXERIL) 5 MG tablet Take 1-2 tablets (5-10 mg total) by mouth 3 (three) times daily as needed for muscle spasms. 30 tablet 1   gabapentin (NEURONTIN) 300 MG capsule TAKE 1 CAPSULE BY MOUTH FOUR TIMES DAILY 360 capsule 2   glipiZIDE (GLUCOTROL XL) 5 MG 24 hr tablet TAKE ONE TABLET BY MOUTH EVERY DAY 90 tablet 1   Glucose Blood (BLOOD GLUCOSE TEST  STRIPS) STRP Use to check glucose twice daily. May substitute to any manufacturer covered by patient's insurance. 100 strip 3   iron polysaccharides (NIFEREX) 150 MG capsule Take 1 capsule (150 mg total) by mouth daily. 90 capsule 0   JARDIANCE 25 MG TABS tablet TAKE 1 TABLET BY MOUTH ONCE DAILY 90 tablet 3   Lancet Device MISC Use to check glucose twice daily. May substitute to any manufacturer covered by patient's insurance. 1 each 1   Lancets Misc. MISC Use to check glucose twice dialy. May substitute to any manufacturer covered by patient's insurance. 100 each 3   lisinopril-hydrochlorothiazide (ZESTORETIC) 10-12.5 MG tablet TAKE 1 TABLET BY MOUTH DAILY 90 tablet 3   metFORMIN (GLUCOPHAGE) 1000 MG tablet TAKE ONE (1) TABLET BY MOUTH TWO TIMES PER DAY WITH A MEAL 180 tablet 1   metoprolol succinate (TOPROL-XL) 50 MG 24 hr tablet TAKE ONE TABLET BY MOUTH ONCE DAILY WITHOR IMMEDIATELY FOLLOWING A MEAL 90 tablet 1   Multiple Vitamin (MULTIVITAMIN) tablet Take 1 tablet by mouth daily.     tirzepatide (MOUNJARO) 10 MG/0.5ML Pen Inject 10 mg into the skin once a week. 6 mL 2   traMADol (ULTRAM) 50 MG tablet TAKE ONE TABLET EVERY EIGHT HOURS AS NEEDED FOR MODERATE PAIN 90 tablet 1   No current facility-administered medications on file prior to visit.     ROS  see history of present illness  Objective  Physical Exam Vitals:   04/10/23 0807  BP: 118/68  Pulse: 76  Resp: 16  Temp: 97.9 F (36.6 C)  SpO2: 97%    BP Readings from Last 3 Encounters:  04/10/23 118/68  03/23/23 130/70  02/01/23 136/78   Wt Readings from Last 3 Encounters:  04/10/23 290 lb (131.5 kg)  03/23/23 288 lb 2 oz (130.7 kg)  02/01/23 (!) 305 lb 14.1 oz (138.7 kg)    Physical Exam Constitutional:      General: He is not in acute distress.    Appearance: He is not diaphoretic.  Cardiovascular:     Rate and Rhythm: Normal rate and regular rhythm.     Heart sounds: Normal heart sounds.  Pulmonary:     Effort:  Pulmonary effort is normal.     Breath sounds: Normal breath sounds.  Abdominal:     General: Bowel sounds are normal. There is no distension.     Palpations: Abdomen is soft.     Tenderness: There is no abdominal tenderness.  Lymphadenopathy:     Cervical: No cervical adenopathy.  Skin:    General: Skin is warm and dry.  Neurological:     Mental Status: He is alert.      Assessment/Plan: Please see individual problem list.  Pre-op exam Assessment & Plan: Preoperative exam completed today.  Patient's Chales Abrahams perioperative risk score is 0.2% placing him in low cardiac risk category.  He is able to complete adequate METS.  NSQIP risk calculator reveals above average risk for serious complication and average risk for any complication.  Patient's elevated A1c complicates this.  Discussed healing could be an issue with his poorly controlled diabetes.  Discussed with the patient that I would include this information with his surgical evaluation paperwork and that his surgeon may not want to do the surgery with an A1c at 8.1.  Paperwork completed for preoperative exam recommending to consider getting diabetes under better control prior to surgery.  Orders: -     CBC -     Basic metabolic panel  Uncontrolled type 2 diabetes mellitus with hyperglycemia, without long-term current use of insulin [name] Assessment & Plan: Uncontrolled.  Checking glucose today.  Discussed that his elevated A1c may cause the surgeon to delay his surgery.  Patient will increase to Mounjaro 10 mg weekly once he finishes his 7.5 mg supply.  He will also continue Jardiance 25 mg daily, glipizide 5 mg daily, and metformin 1000 mg twice daily.   Charcot ankle, left Assessment & Plan: Chronic issue.  Patient is scheduled to have ankle surgery.     Return for as scheduled.   Marikay Alar, MD [facility]

## 2023-04-10 NOTE — Assessment & Plan Note (Addendum)
Preoperative exam completed today.  Patient's Joseph Singh perioperative risk score is 0.2% placing him in low cardiac risk category.  He is able to complete adequate METS.  NSQIP risk calculator reveals above average risk for serious complication and average risk for any complication.  Patient's elevated A1c complicates this.  Discussed healing could be an issue with his poorly controlled diabetes.  Discussed with the patient that I would include this information with his surgical evaluation paperwork and that his surgeon may not want to do the surgery with an A1c at 8.1.  Paperwork completed for preoperative exam recommending to consider getting diabetes under better control prior to surgery.

## 2023-04-11 ENCOUNTER — Other Ambulatory Visit: Payer: Self-pay | Admitting: Family Medicine

## 2023-04-11 DIAGNOSIS — M542 Cervicalgia: Secondary | ICD-10-CM

## 2023-04-17 ENCOUNTER — Other Ambulatory Visit: Payer: Self-pay | Admitting: Family Medicine

## 2023-04-17 DIAGNOSIS — G629 Polyneuropathy, unspecified: Secondary | ICD-10-CM

## 2023-04-20 ENCOUNTER — Telehealth: Payer: Self-pay

## 2023-04-20 NOTE — Transitions of Care (Post Inpatient/ED Visit) (Signed)
 04/20/2023  Name: Joseph Singh MRN: [id number] DOB: 1960-10-25  Today's TOC FU Call Status: Today's TOC FU Call Status:: Successful TOC FU Call Completed TOC FU Call Complete Date: 04/20/23 Patient's Name and Date of Birth confirmed.  Transition Care Management Follow-up Telephone Call Date of Discharge: 04/19/23 Discharge Facility: Other ([facility]) Name of Other ([facility]) Discharge Facility: [facility] Type of Discharge: Inpatient Admission Primary Inpatient Discharge Diagnosis:: Charcot ankle How have you been since you were released from the hospital?: Worse Any questions or concerns?: Yes Patient Questions/Concerns:: The patient's wound dehisceced Patient Questions/Concerns Addressed: Other: (The patient contacted the MD and he is going to the ED)  Items Reviewed: Did you receive and understand the discharge instructions provided?: Yes Medications obtained,verified, and reconciled?: Yes (Medications Reviewed) Any new allergies since your discharge?: No Dietary orders reviewed?: Yes Type of Diet Ordered:: Diabetic Do you have support at home?: Yes People in Home: spouse Name of Support/Comfort Primary Source: [place], spouse  Medications Reviewed Today: Medications Reviewed Today     Reviewed by Redge Gainer, RN (Case Manager) on 04/20/23 at 1008  Med List Status: <None>   Medication Order Taking? Sig Documenting Provider Last Dose Status Informant  amLODipine (NORVASC) 10 MG tablet [id number]  TAKE 1 TABLET BY MOUTH ONCE DAILY Glori Luis, MD  Active   aspirin EC 81 MG tablet [id number] No Take 1 tablet (81 mg total) by mouth daily. Annice Needy, MD Taking Active Self, Pharmacy Records  atorvastatin (LIPITOR) 80 MG tablet [id number] No Take 1 tablet (80 mg total) by mouth daily. Glori Luis, MD Taking Active   Blood Glucose Monitoring Suppl DEVI [id number] No Use to check glucose as directed. May substitute to any manufacturer covered by  patient's insurance. Glori Luis, MD Taking Active   clopidogrel (PLAVIX) 75 MG tablet [id number] No TAKE ONE TABLET BY MOUTH EVERY DAY Glori Luis, MD Taking Active   cyclobenzaprine (FLEXERIL) 5 MG tablet [id number] No Take 1-2 tablets (5-10 mg total) by mouth 3 (three) times daily as needed for muscle spasms. Allegra Grana, FNP Taking Active   gabapentin (NEURONTIN) 300 MG capsule [id number]  TAKE 1 CAPSULE BY MOUTH FOUR TIMES DAILY Glori Luis, MD  Active   glipiZIDE (GLUCOTROL XL) 5 MG 24 hr tablet [id number] No TAKE ONE TABLET BY MOUTH EVERY DAY Glori Luis, MD Taking Active   Glucose Blood (BLOOD GLUCOSE TEST STRIPS) STRP [id number] No Use to check glucose twice daily. May substitute to any manufacturer covered by patient's insurance. Glori Luis, MD Taking Active   iron polysaccharides (NIFEREX) 150 MG capsule [id number] No Take 1 capsule (150 mg total) by mouth daily. Glori Luis, MD Taking Active   JARDIANCE 25 MG TABS tablet [id number] No TAKE 1 TABLET BY MOUTH ONCE DAILY Glori Luis, MD Taking Active   Lancet Device MISC [id number] No Use to check glucose twice daily. May substitute to any manufacturer covered by patient's insurance. Glori Luis, MD Taking Active   Lancets Misc. MISC [id number] No Use to check glucose twice dialy. May substitute to any manufacturer covered by patient's insurance. Glori Luis, MD Taking Active   lisinopril-hydrochlorothiazide (ZESTORETIC) 10-12.5 MG tablet [id number] No TAKE 1 TABLET BY MOUTH DAILY Glori Luis, MD Taking Active   metFORMIN (GLUCOPHAGE) 1000 MG tablet [id number] No TAKE ONE (1) TABLET BY MOUTH TWO TIMES PER DAY WITH A MEAL Glori Luis, MD Taking Active   metoprolol succinate (TOPROL-XL)  50 MG 24 hr tablet [id number] No TAKE ONE TABLET BY MOUTH ONCE DAILY WITHOR IMMEDIATELY FOLLOWING A MEAL Glori Luis, MD Taking Active   Multiple Vitamin (MULTIVITAMIN) tablet  [id number] No Take 1 tablet by mouth daily. [provider] Taking Active   tirzepatide [facility]) 10 MG/0.5ML Pen [id number]  Inject 10 mg into the skin once a week. Allegra Grana, FNP  Active   traMADol Janean Sark) 50 MG tablet [id number]  TAKE 1 TABLET BY MOUTH EVERY 8 HOURS AS NEEDED FOR MODERATE PAIN Glori Luis, MD  Active             Home Care and Equipment/Supplies: Were Home Health Services Ordered?: NA Any new equipment or medical supplies ordered?: NA  Functional Questionnaire: Do you need assistance with bathing/showering or dressing?: Yes Do you need assistance with meal preparation?: Yes Do you need assistance with eating?: No Do you have difficulty maintaining continence: No Do you need assistance with getting out of bed/getting out of a chair/moving?: Yes Do you have difficulty managing or taking your medications?: Yes  Follow up appointments reviewed: PCP Follow-up appointment confirmed?: Yes Date of PCP follow-up appointment?: 05/04/23 Follow-up Provider: Bethanie Dicker Specialist [facility] Follow-up appointment confirmed?: No Reason Specialist Follow-Up Not Confirmed: Patient has Specialist Provider Number and will Call for Appointment (The patient is on the way to the ED and will see Dr. Marco Collie) Do you need transportation to your follow-up appointment?: No Do you understand care options if your condition(s) worsen?: Yes-patient verbalized understanding  SDOH Interventions Today    Flowsheet Row Most Recent Value  SDOH Interventions   Food Insecurity Interventions Intervention Not Indicated  Housing Interventions Intervention Not Indicated  Transportation Interventions Intervention Not Indicated  Utilities Interventions Intervention Not Indicated      Interventions Today    Flowsheet Row Most Recent Value  General Interventions   General Interventions Discussed/Reviewed General Interventions Discussed, General Interventions Reviewed   Exercise Interventions   Exercise Discussed/Reviewed Physical Activity  Physical Activity Discussed/Reviewed Physical Activity Discussed, Physical Activity Reviewed  Education Interventions   Education Provided Provided Education  Provided Verbal Education On When to see the doctor, Medication, Insurance Plans, Other  [facility]]  Pharmacy Interventions   Pharmacy Dicussed/Reviewed Medications and their functions  Safety Interventions   Safety Discussed/Reviewed Fall Risk       TOC Outreach completed to the patient today. He was discharged home yesterday after having a Charcot foot external fixator placed. He is NWB for 16 weeks but did put some weight on his foot causing the wound to dehiscence. He contacted the provider who performed the surgery Dr. Marco Collie and has been instructed to go back to the ED at [facility]. His wife states she cannot care for him at home because he is 6'2 and 300 pounds and she has difficulty with trying to help him. She is going to ask for SNF placement until he can be taught how to transfer himself and be independent. Discussed Home Health RN, PT and OT if the insurance company denies placement. She would also like to change her PCP appointment to a Virtual appointment due to the constraints of getting him in and out of the house. Message sent to the provider.   The patient has been provided with contact information for the care management team and has been advised to call with any health-related questions or concerns. The patient verbalized understanding with current POC. The patient is directed to their insurance card regarding availability  of benefits coverage.  Deidre Ala, BSN, RN [place]  VBCI - [employer] [facility] RN Care Manager [phone]

## 2023-04-29 ENCOUNTER — Other Ambulatory Visit: Payer: Self-pay | Admitting: Family

## 2023-04-29 DIAGNOSIS — M542 Cervicalgia: Secondary | ICD-10-CM

## 2023-05-04 ENCOUNTER — Telehealth (INDEPENDENT_AMBULATORY_CARE_PROVIDER_SITE_OTHER): Payer: 59 | Admitting: Nurse Practitioner

## 2023-05-04 VITALS — BP 140/68 | Temp 97.3°F | Ht 76.0 in

## 2023-05-04 DIAGNOSIS — I63239 Cerebral infarction due to unspecified occlusion or stenosis of unspecified carotid arteries: Secondary | ICD-10-CM

## 2023-05-04 DIAGNOSIS — Z794 Long term (current) use of insulin: Secondary | ICD-10-CM

## 2023-05-04 DIAGNOSIS — M5441 Lumbago with sciatica, right side: Secondary | ICD-10-CM

## 2023-05-04 DIAGNOSIS — M14672 Charcot's joint, left ankle and foot: Secondary | ICD-10-CM

## 2023-05-04 DIAGNOSIS — E119 Type 2 diabetes mellitus without complications: Secondary | ICD-10-CM | POA: Diagnosis not present

## 2023-05-04 DIAGNOSIS — G8929 Other chronic pain: Secondary | ICD-10-CM

## 2023-05-04 DIAGNOSIS — I10 Essential (primary) hypertension: Secondary | ICD-10-CM | POA: Diagnosis not present

## 2023-05-04 DIAGNOSIS — E785 Hyperlipidemia, unspecified: Secondary | ICD-10-CM

## 2023-05-04 MED ORDER — AMLODIPINE BESYLATE 10 MG PO TABS: 10.0000 mg | ORAL_TABLET | Freq: Every day | ORAL | 3 refills | Status: AC

## 2023-05-04 MED ORDER — TRAMADOL HCL 50 MG PO TABS
ORAL_TABLET | ORAL | 2 refills | Status: AC
Start: 2023-05-04 — End: ?

## 2023-05-04 MED ORDER — FREESTYLE LIBRE 3 SENSOR MISC: 11 refills | Status: DC

## 2023-05-04 NOTE — Assessment & Plan Note (Signed)
 Blood pressure is well-controlled with a recent reading of 130/70 mmHg. Continue current medications: Norvasc, Lisinopril, and Hydrochlorothiazide. Monitor blood pressure regularly, especially during physical therapy sessions.

## 2023-05-04 NOTE — Assessment & Plan Note (Signed)
 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 Neurology and vascular evaluations are scheduled. Refer to local neurology for an earlier appointment and attend the vascular surgery appointment on May 11, 2023. Continue physical and occupational therapy at home. Monitor blood pressure regularly, especially during physical therapy sessions.

## 2023-05-04 NOTE — Assessment & Plan Note (Signed)
 Chronic issue. He has been on Tramadol 50 mg every 8 hours as needed for several years. Stable on mediation. Refills sent. PDMP reviewed.

## 2023-05-04 NOTE — Assessment & Plan Note (Signed)
 Hyperlipidemia is managed with Lipitor. Continue current medication.

## 2023-05-04 NOTE — Assessment & Plan Note (Signed)
 Post-surgical wound dehiscence occurred due to accidental weight-bearing. He is currently non-weight bearing with fixations and requires close monitoring to prevent complications. Continue non-weight bearing status and monitor wound healing and fixations. Ensure follow-up with the orthopedic surgeon as needed.

## 2023-05-04 NOTE — Assessment & Plan Note (Signed)
 Diabetes is well-controlled with an A1c of 6.8, but insurance coverage for a CGM is affected by the lack of insulin use. Attempt to obtain insurance approval for a continuous glucose monitor. Continue current diabetes medications: Jardiance, Glucotrol, Metformin, and Mounjaro.

## 2023-05-04 NOTE — Progress Notes (Signed)
 MyChart Video Visit    Virtual Visit via Video Note   This visit type was conducted because this format is felt to be most appropriate for this patient at this time. Physical exam was limited by quality of the video and audio technology used for the visit. CMA was able to get the patient set up on a video visit.  Patient location: Home. Patient, wife and provider in visit Provider location: Office  I discussed the limitations of evaluation and management by telemedicine and the availability of in person appointments. The patient expressed understanding and agreed to proceed.  Visit Date: 05/04/2023  Today's healthcare provider: Bethanie Dicker, NP     Subjective:    Patient ID: Joseph Singh, male    DOB: 03-Dec-1960, 63 y.o.   MRN: [id number]  Chief Complaint  Patient presents with   Transitions Of Care    HPI  Discussed the use of AI scribe software for clinical note transcription with the patient, who gave verbal consent to proceed.  History of Present Illness   The patient, with a history of Charcot joint and recent stroke, presents for transfer of care visit. He is present with his wife.  He has a history of Charcot joint affecting the left ankle and underwent foot surgery on April 18, 2023. Post-surgery, he was non-weight bearing but accidentally stepped on the foot, causing the wound to open and requiring additional surgery. He experienced a fall the following day while attempting to get into a truck to go to the emergency room.  While hospitalized for the foot surgery complications, he suffered a stroke. He is experiencing right-sided paralysis and weakness, which complicates mobility as he is also non-weight bearing. 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  He has a history of diabetes, currently managed with Jardiance, Glucotrol, metformin, and Mounjaro. His A1c was last recorded at 6.8 on  April 22, 2023, indicating controlled diabetes. He does not regularly check his blood sugar at home and is not on insulin. No excessive thirst or urination.  He is also on Norvasc, lisinopril, hydrochlorothiazide, Plavix, and Lipitor for blood pressure and cholesterol management. No chest pain or shortness of breath.      Past Medical History:  Diagnosis Date   Anemia    Arthritis    Diabetes mellitus without complication [name]    History of kidney stones    h/o   Hyperlipidemia    Hypertension    Neuromuscular disorder [name]    Neuropathy     Past Surgical History:  Procedure Laterality Date   AMPUTATION TOE Left 08/10/2018   Procedure: RAY LEFT;  Surgeon: Gwyneth Revels, DPM;  Location: [facility];  Service: Podiatry;  Laterality: Left;   ARTHRODESIS METATARSAL Left 02/01/2019   Procedure: ARTHRODESIS METATARSAL;  Surgeon: Gwyneth Revels, DPM;  Location: [facility];  Service: Podiatry;  Laterality: Left;   CYSTOSCOPY/URETEROSCOPY/HOLMIUM LASER/STENT PLACEMENT Right 04/01/2022   Procedure: CYSTOSCOPY/URETEROSCOPY/HOLMIUM LASER/STENT PLACEMENT;  Surgeon: Sondra Come, MD;  Location: [facility];  Service: Urology;  Laterality: Right;   CYSTOSCOPY/URETEROSCOPY/HOLMIUM LASER/STENT PLACEMENT Right 04/22/2022   Procedure: CYSTOSCOPY/URETEROSCOPY/HOLMIUM LASER/STENT EXCHANGE;  Surgeon: Sondra Come, MD;  Location: [facility];  Service: Urology;  Laterality: Right;   FOOT ARTHRODESIS Left 02/01/2019   Procedure: ARTHRODESIS FOOT;STJ;  Surgeon: Gwyneth Revels, DPM;  Location: [facility];  Service: Podiatry;  Laterality: Left;   IRRIGATION AND DEBRIDEMENT FOOT Left 10/26/2018  Procedure: IRRIGATION AND DEBRIDEMENT FOOT, LEFT;  Surgeon: Gwyneth Revels, DPM;  Location: [facility];  Service: Podiatry;  Laterality: Left;   LOWER EXTREMITY ANGIOGRAPHY Right 02/25/2016   Procedure: Lower Extremity Angiography;  Surgeon: Annice Needy, MD;  Location: [facility];  Service: Cardiovascular;   Laterality: Right;   LOWER EXTREMITY ANGIOGRAPHY Right 02/27/2017   Procedure: LOWER EXTREMITY ANGIOGRAPHY;  Surgeon: Annice Needy, MD;  Location: [facility];  Service: Cardiovascular;  Laterality: Right;   LOWER EXTREMITY ANGIOGRAPHY Left 08/06/2018   Procedure: LOWER EXTREMITY ANGIOGRAPHY;  Surgeon: Annice Needy, MD;  Location: [facility];  Service: Cardiovascular;  Laterality: Left;   LOWER EXTREMITY INTERVENTION  02/25/2016   Procedure: Lower Extremity Intervention;  Surgeon: Annice Needy, MD;  Location: [facility];  Service: Cardiovascular;;   SHOULDER OPEN ROTATOR CUFF REPAIR Right    2012   TOE AMPUTATION      Family History  Problem Relation Age of Onset   Diabetes Mother    Heart disease Father    Diabetes Sister    Diabetes Brother    Cancer Sister        lung   Cancer Sister        breast    Social History   Socioeconomic History   Marital status: Married    Spouse name: Not on file   Number of children: 1   Years of education: Not on file   Highest education level: GED or equivalent  Occupational History   Not on file  Tobacco Use   Smoking status: Former    Current packs/day: 0.00    Average packs/day: 0.5 packs/day for 35.0 years (17.5 ttl pk-yrs)    Types: Cigarettes    Start date: 08/09/1983    Quit date: 08/09/2018    Years since quitting: 4.7   Smokeless tobacco: Never  Vaping Use   Vaping status: Never Used  Substance and Sexual Activity   Alcohol use: No    Alcohol/week: 0.0 standard drinks of alcohol   Drug use: No   Sexual activity: Yes    Partners: Female    Comment: Wife  Other Topics Concern   Not on file  Social History Narrative   Permanently disabled   [place] on the job and hurt his shoulder    Lives with wife and 2 children    1 grandchild from a previous marriage    Pets: Not inside    GED    Right handed    Caffeine- 2-3 sodas, tea, no coffee    Enjoys watching tv and being outside    Social  Drivers of [employer] Strain: Medium Risk (03/20/2023)   Overall Financial Resource Strain (CARDIA)    Difficulty of Paying Living Expenses: Somewhat hard  Food Insecurity: Food Insecurity Present (04/21/2023)   Received from [facility]   Hunger Vital Sign    Worried About Running Out of Food in the Last Year: Sometimes true    Ran Out of Food in the Last Year: Sometimes true  Transportation Needs: No Transportation Needs (04/21/2023)   Received from [facility] - Transportation    Lack of Transportation (Medical): No    Lack of Transportation (Non-Medical): No  Physical Activity: Inactive (03/20/2023)   Exercise Vital Sign    Days of Exercise per Week: 0 days    Minutes of Exercise per Session: 20 min  Stress: No Stress Concern Present (  04/21/2023)   Received from [facility] - Occupational Stress Questionnaire    Feeling of Stress : Not at all  Social Connections: Moderately Integrated (03/20/2023)   Social Connection and Isolation Panel [NHANES]    Frequency of Communication with Friends and Family: Three times a week    Frequency of Social Gatherings with Friends and Family: Never    Attends Religious Services: 1 to 4 times per year    Active Member of [employer] or Organizations: No    Attends Banker Meetings: Patient unable to answer    Marital Status: Married  Catering manager Violence: Not At Risk (04/20/2023)   Humiliation, Afraid, Rape, and Kick questionnaire    Fear of Current or Ex-Partner: No    Emotionally Abused: No    Physically Abused: No    Sexually Abused: No    Outpatient Medications Prior to Visit  Medication Sig Dispense Refill   amLODipine (NORVASC) 10 MG tablet TAKE 1 TABLET BY MOUTH ONCE DAILY 90 tablet 0   aspirin EC 81 MG tablet Take 1 tablet (81 mg total) by mouth daily. 150 tablet 2   atorvastatin (LIPITOR) 80 MG tablet Take 1 tablet (80 mg total) by mouth daily. 90  tablet 3   clopidogrel (PLAVIX) 75 MG tablet TAKE ONE TABLET BY MOUTH EVERY DAY 90 tablet 3   gabapentin (NEURONTIN) 300 MG capsule TAKE 1 CAPSULE BY MOUTH FOUR TIMES DAILY 360 capsule 1   glipiZIDE (GLUCOTROL XL) 5 MG 24 hr tablet TAKE ONE TABLET BY MOUTH EVERY DAY 90 tablet 1   JARDIANCE 25 MG TABS tablet TAKE 1 TABLET BY MOUTH ONCE DAILY 90 tablet 3   lisinopril-hydrochlorothiazide (ZESTORETIC) 10-12.5 MG tablet TAKE 1 TABLET BY MOUTH DAILY 90 tablet 3   metFORMIN (GLUCOPHAGE) 1000 MG tablet TAKE ONE (1) TABLET BY MOUTH TWO TIMES PER DAY WITH A MEAL 180 tablet 1   metoprolol succinate (TOPROL-XL) 50 MG 24 hr tablet TAKE ONE TABLET BY MOUTH ONCE DAILY WITHOR IMMEDIATELY FOLLOWING A MEAL 90 tablet 1   Multiple Vitamin (MULTIVITAMIN) tablet Take 1 tablet by mouth daily.     tirzepatide (MOUNJARO) 10 MG/0.5ML Pen Inject 10 mg into the skin once a week. 6 mL 2   traMADol (ULTRAM) 50 MG tablet TAKE 1 TABLET BY MOUTH EVERY 8 HOURS AS NEEDED FOR MODERATE PAIN 90 tablet 0   Blood Glucose Monitoring Suppl DEVI Use to check glucose as directed. May substitute to any manufacturer covered by patient's insurance. 1 each 0   cyclobenzaprine (FLEXERIL) 5 MG tablet Take 1-2 tablets (5-10 mg total) by mouth 3 (three) times daily as needed for muscle spasms. 30 tablet 1   Glucose Blood (BLOOD GLUCOSE TEST STRIPS) STRP Use to check glucose twice daily. May substitute to any manufacturer covered by patient's insurance. 100 strip 3   iron polysaccharides (NIFEREX) 150 MG capsule Take 1 capsule (150 mg total) by mouth daily. 90 capsule 0   Lancet Device MISC Use to check glucose twice daily. May substitute to any manufacturer covered by patient's insurance. 1 each 1   Lancets Misc. MISC Use to check glucose twice dialy. May substitute to any manufacturer covered by patient's insurance. 100 each 3   No facility-administered medications prior to visit.    Allergies  Allergen Reactions   Cymbalta [Duloxetine Hcl]  Nausea Only    Tried 01/2023    ROS See HPI    Objective:    Physical Exam  BP (!) 140/68 Comment: yesterday pt reported  Temp (!) 97.3 F (36.3 C) Comment: yesterday pt reported  Ht 6\' 4"  (1.93 m)   SpO2 98% Comment: yesterday pt reported  BMI 35.30 kg/m  Wt Readings from Last 3 Encounters:  04/10/23 290 lb (131.5 kg)  03/23/23 288 lb 2 oz (130.7 kg)  02/01/23 (!) 305 lb 14.1 oz (138.7 kg)   GENERAL: alert, oriented, appears well and in no acute distress   HEENT: atraumatic, conjunttiva clear, no obvious abnormalities on inspection of external nose and ears   NECK: normal movements of the head and neck   LUNGS: on inspection no signs of respiratory distress, breathing rate appears normal, no obvious gross SOB, gasping or wheezing   CV: no obvious cyanosis   MS: moves all visible extremities without noticeable abnormality   PSYCH/NEURO: pleasant and cooperative, no obvious depression or anxiety, speech and thought processing grossly intact    Assessment & Plan:   Problem List Items Addressed This Visit       Cardiovascular and Mediastinum   Essential hypertension (Chronic)   Blood pressure is well-controlled with a recent reading of 130/70 mmHg. Continue current medications: Norvasc, Lisinopril, and Hydrochlorothiazide. Monitor blood pressure regularly, especially during physical therapy sessions.      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 Neurology and vascular evaluations are scheduled. Refer to local neurology for an earlier appointment and attend the vascular surgery appointment on May 11, 2023. Continue physical and occupational therapy at home. Monitor blood pressure regularly, especially during physical therapy sessions.      Relevant Orders   Ambulatory referral to Neurology     Endocrine   Type 2  diabetes mellitus [name]   Diabetes is well-controlled with an A1c of 6.8, but insurance coverage for a CGM is affected by the lack of insulin use. Attempt to obtain insurance approval for a continuous glucose monitor. Continue current diabetes medications: Jardiance, Glucotrol, Metformin, and Mounjaro.      Relevant Medications   Continuous Glucose Sensor (FREESTYLE LIBRE 3 SENSOR) MISC     Musculoskeletal and Integument   Charcot ankle, left   Post-surgical wound dehiscence occurred due to accidental weight-bearing. He is currently non-weight bearing with fixations and requires close monitoring to prevent complications. Continue non-weight bearing status and monitor wound healing and fixations. Ensure follow-up with the orthopedic surgeon as needed.      Relevant Medications   traMADol (ULTRAM) 50 MG tablet     Other   Back pain (Chronic)   Chronic issue. He has been on Tramadol 50 mg every 8 hours as needed for several years. Stable on mediation. Refills sent. PDMP reviewed.       Relevant Medications   traMADol (ULTRAM) 50 MG tablet   Hyperlipidemia (Chronic)   Hyperlipidemia is managed with Lipitor. Continue current medication.       I have discontinued Glenard Haring. Chizek's iron polysaccharides, Blood Glucose Monitoring Suppl, BLOOD GLUCOSE TEST STRIPS, Lancet Device, Lancets Misc., and cyclobenzaprine. I am also having him start on FreeStyle Libre 3 Sensor. Additionally, I am having him maintain his aspirin EC, multivitamin, lisinopril-hydrochlorothiazide, atorvastatin, clopidogrel, glipiZIDE, Jardiance, metoprolol succinate, metFORMIN, tirzepatide, amLODipine, gabapentin, and traMADol.  Meds ordered this encounter  Medications   Continuous Glucose Sensor (FREESTYLE LIBRE 3 SENSOR) MISC    Sig: Place 1 sensor on the skin  every 14 days. Use to check glucose continuously    Dispense:  2 each    Refill:  11    Supervising Provider:   Duncan Dull L [[id number]]   traMADol (ULTRAM) 50  MG tablet    Sig: TAKE 1 TABLET BY MOUTH EVERY 8 HOURS AS NEEDED FOR MODERATE PAIN    Dispense:  90 tablet    Refill:  2    Supervising Provider:   Sherlene Shams [[id number]]   I discussed the assessment and treatment plan with the patient. The patient was provided an opportunity to ask questions and all were answered. The patient agreed with the plan and demonstrated an understanding of the instructions.   The patient was advised to call back or seek an in-person evaluation if the symptoms worsen or if the condition fails to improve as anticipated.   Bethanie Dicker, NP [facility] at [facility] [phone] (phone) [phone] (fax)  [facility]

## 2023-05-11 ENCOUNTER — Encounter: Payer: Self-pay | Admitting: Nurse Practitioner

## 2023-05-17 ENCOUNTER — Telehealth: Payer: Self-pay

## 2023-05-17 NOTE — Telephone Encounter (Signed)
 Plan of care forms received but is dated 05-16-23 and stated they need to be signed and dated within 7 days to ensure billing compliance.    Forms have been placed in provider to be signed folder.

## 2023-05-17 NOTE — Telephone Encounter (Signed)
 Forms faxed to the given fax number [phone] with a completed transmission log and placed in front staff red folder to be sent to billing/ scanned into chart.

## 2023-05-18 ENCOUNTER — Telehealth: Payer: Self-pay

## 2023-05-18 NOTE — Telephone Encounter (Signed)
 [facility] home health orders received needing provider signature.   Forms have a note stating forms should be signed and faxed back within 7 days for billing compliance.    Forms have been handed to you due to the deadline

## 2023-05-18 NOTE — Telephone Encounter (Signed)
 Forms faxed to the given fax number [phone] with a completed transmission log and form placed in front staff folder to be scanned into chart

## 2023-05-19 ENCOUNTER — Telehealth: Payer: Self-pay

## 2023-05-19 NOTE — Telephone Encounter (Signed)
 Pharmacy Patient Advocate Encounter   Received notification from CoverMyMeds that prior authorization for FreeStyle Libre 3 Sensor is required/requested.   Insurance verification completed.   The patient is insured through [facility] .   Per test claim: PA required; PA submitted to above mentioned insurance via CoverMyMeds Key/confirmation #/EOC [id number] Status is pending

## 2023-05-22 ENCOUNTER — Telehealth: Payer: Self-pay

## 2023-05-22 NOTE — Telephone Encounter (Signed)
 Copied from CRM [id number]. Topic: Clinical - Medication Question >> May 22, 2023  1:40 PM Martinique E wrote: Reason for CRM: June with [employer] called in regarding some questions they had about the patient's Continuous Glucose Sensor (FREESTYLE LIBRE 3 SENSOR) MISC. Agent offered to write these questions down, but June stated he would rather speak about it over the phone, as they are lengthy questions. Callback number [phone] with ref # [id number] to discuss these questions.

## 2023-05-22 NOTE — Telephone Encounter (Signed)
 Called and answered questions decision will be faxed

## 2023-05-24 ENCOUNTER — Telehealth: Payer: Self-pay

## 2023-05-24 NOTE — Telephone Encounter (Signed)
 Tried to call and speak with someone in regards to this was on hold for 15+ minutes no one ever came to the phone

## 2023-05-24 NOTE — Telephone Encounter (Signed)
 Copied from CRM [id number]. Topic: Clinical - Prescription Issue >> May 24, 2023  4:23 PM Sim Boast F wrote: Reason for CRM: [facility] request call back regarding clinical questions for prior auth for the freestyle libre call back number is [phone] ref# [id number] >> May 24, 2023  4:27 PM Sim Boast F wrote: This is due 06/02/23

## 2023-05-25 NOTE — Telephone Encounter (Signed)
 Called and spoke with Vita Barley in regards to this ref# and she stated that it does not say they need any further questions that it is pending pharmacist review, she stated she was going to merge into the note to see what was missing if something was missing but she was unable to because another rep has it locked on their screen. She stated if something is truly needed someone will reach back out.

## 2023-05-25 NOTE — Telephone Encounter (Signed)
Additional information has been requested from the patient's insurance in order to proceed with the prior authorization request. Requested information has been sent, or form has been filled out and faxed back to [phone]

## 2023-05-26 NOTE — Telephone Encounter (Signed)
 Pharmacy Patient Advocate Encounter  Received notification from [facility] that Prior Authorization for  FreeStyle Libre 3 Sensor  has been DENIED.  Full denial letter will be uploaded to the media tab. See denial reason below.   PA #/Case ID/Reference #: [id number]

## 2023-06-08 ENCOUNTER — Other Ambulatory Visit: Payer: Self-pay

## 2023-06-08 DIAGNOSIS — I10 Essential (primary) hypertension: Secondary | ICD-10-CM

## 2023-06-08 MED ORDER — LISINOPRIL-HYDROCHLOROTHIAZIDE 10-12.5 MG PO TABS: 1.0000 | ORAL_TABLET | Freq: Every day | ORAL | 3 refills | Status: AC

## 2023-06-21 ENCOUNTER — Encounter: Payer: Self-pay | Admitting: Nurse Practitioner

## 2023-06-21 ENCOUNTER — Telehealth: Payer: Self-pay

## 2023-06-21 ENCOUNTER — Other Ambulatory Visit (HOSPITAL_COMMUNITY): Payer: Self-pay

## 2023-06-21 NOTE — Telephone Encounter (Signed)
 Received notification that PA has been cancelled due to previous denial on file, no chart notes showing recent PA submission.

## 2023-06-23 NOTE — Telephone Encounter (Signed)
 Insurances have started to get stricter with CGM coverage, unfortunately, they will likely not cover due to the pt not being insulin  dependent.

## 2023-06-26 DIAGNOSIS — I10 Essential (primary) hypertension: Secondary | ICD-10-CM | POA: Diagnosis not present

## 2023-06-26 DIAGNOSIS — Z8673 Personal history of transient ischemic attack (TIA), and cerebral infarction without residual deficits: Secondary | ICD-10-CM | POA: Diagnosis not present

## 2023-06-26 DIAGNOSIS — E1142 Type 2 diabetes mellitus with diabetic polyneuropathy: Secondary | ICD-10-CM | POA: Diagnosis not present

## 2023-06-27 ENCOUNTER — Encounter: Payer: Self-pay | Admitting: Nurse Practitioner

## 2023-06-27 DIAGNOSIS — E1151 Type 2 diabetes mellitus with diabetic peripheral angiopathy without gangrene: Secondary | ICD-10-CM | POA: Diagnosis not present

## 2023-06-27 DIAGNOSIS — T81328D Disruption or dehiscence of closure of other specified internal operation (surgical) wound, subsequent encounter: Secondary | ICD-10-CM | POA: Diagnosis not present

## 2023-06-27 DIAGNOSIS — E1140 Type 2 diabetes mellitus with diabetic neuropathy, unspecified: Secondary | ICD-10-CM | POA: Diagnosis not present

## 2023-06-27 DIAGNOSIS — Z7902 Long term (current) use of antithrombotics/antiplatelets: Secondary | ICD-10-CM | POA: Diagnosis not present

## 2023-06-27 DIAGNOSIS — E785 Hyperlipidemia, unspecified: Secondary | ICD-10-CM | POA: Diagnosis not present

## 2023-06-27 DIAGNOSIS — Z7985 Long-term (current) use of injectable non-insulin antidiabetic drugs: Secondary | ICD-10-CM | POA: Diagnosis not present

## 2023-06-27 DIAGNOSIS — T84127D Displacement of internal fixation device of bone of left lower leg, subsequent encounter: Secondary | ICD-10-CM | POA: Diagnosis not present

## 2023-06-27 DIAGNOSIS — K5901 Slow transit constipation: Secondary | ICD-10-CM | POA: Diagnosis not present

## 2023-06-27 DIAGNOSIS — Z95828 Presence of other vascular implants and grafts: Secondary | ICD-10-CM | POA: Diagnosis not present

## 2023-06-27 DIAGNOSIS — I10 Essential (primary) hypertension: Secondary | ICD-10-CM | POA: Diagnosis not present

## 2023-06-27 DIAGNOSIS — Z7982 Long term (current) use of aspirin: Secondary | ICD-10-CM | POA: Diagnosis not present

## 2023-06-27 DIAGNOSIS — Z9181 History of falling: Secondary | ICD-10-CM | POA: Diagnosis not present

## 2023-06-27 DIAGNOSIS — E11610 Type 2 diabetes mellitus with diabetic neuropathic arthropathy: Secondary | ICD-10-CM | POA: Diagnosis not present

## 2023-06-27 DIAGNOSIS — Z7984 Long term (current) use of oral hypoglycemic drugs: Secondary | ICD-10-CM | POA: Diagnosis not present

## 2023-06-27 DIAGNOSIS — I6523 Occlusion and stenosis of bilateral carotid arteries: Secondary | ICD-10-CM | POA: Diagnosis not present

## 2023-07-05 ENCOUNTER — Other Ambulatory Visit: Payer: Self-pay | Admitting: Nurse Practitioner

## 2023-07-05 DIAGNOSIS — E119 Type 2 diabetes mellitus without complications: Secondary | ICD-10-CM

## 2023-07-05 DIAGNOSIS — Z4789 Encounter for other orthopedic aftercare: Secondary | ICD-10-CM | POA: Diagnosis not present

## 2023-07-05 MED ORDER — FREESTYLE LIBRE 3 SENSOR MISC
3 refills | Status: AC
Start: 2023-07-05 — End: ?

## 2023-07-06 DIAGNOSIS — I639 Cerebral infarction, unspecified: Secondary | ICD-10-CM | POA: Diagnosis not present

## 2023-07-10 ENCOUNTER — Telehealth: Payer: Self-pay

## 2023-07-10 DIAGNOSIS — T84197A Other mechanical complication of internal fixation device of bone of left lower leg, initial encounter: Secondary | ICD-10-CM | POA: Diagnosis not present

## 2023-07-10 DIAGNOSIS — Z79899 Other long term (current) drug therapy: Secondary | ICD-10-CM | POA: Diagnosis not present

## 2023-07-10 DIAGNOSIS — I739 Peripheral vascular disease, unspecified: Secondary | ICD-10-CM | POA: Diagnosis not present

## 2023-07-10 DIAGNOSIS — Z4789 Encounter for other orthopedic aftercare: Secondary | ICD-10-CM | POA: Diagnosis not present

## 2023-07-10 DIAGNOSIS — E119 Type 2 diabetes mellitus without complications: Secondary | ICD-10-CM | POA: Diagnosis not present

## 2023-07-10 DIAGNOSIS — Z7984 Long term (current) use of oral hypoglycemic drugs: Secondary | ICD-10-CM | POA: Diagnosis not present

## 2023-07-10 DIAGNOSIS — I10 Essential (primary) hypertension: Secondary | ICD-10-CM | POA: Diagnosis not present

## 2023-07-10 DIAGNOSIS — Z87891 Personal history of nicotine dependence: Secondary | ICD-10-CM | POA: Diagnosis not present

## 2023-07-10 DIAGNOSIS — G4733 Obstructive sleep apnea (adult) (pediatric): Secondary | ICD-10-CM | POA: Diagnosis not present

## 2023-07-10 DIAGNOSIS — Z472 Encounter for removal of internal fixation device: Secondary | ICD-10-CM | POA: Diagnosis not present

## 2023-07-10 NOTE — Telephone Encounter (Signed)
 PA needed for freestyle libre 3 sensor for 90 day supply pt has diabetes Dx

## 2023-07-12 ENCOUNTER — Other Ambulatory Visit (HOSPITAL_COMMUNITY): Payer: Self-pay

## 2023-07-12 NOTE — Telephone Encounter (Addendum)
 PA previously denied. Please see telephone encounter on 05/19/23. Per denial, patient must be on insulin  or have problematic hypoglycemia.

## 2023-07-14 ENCOUNTER — Other Ambulatory Visit: Payer: Self-pay | Admitting: Nurse Practitioner

## 2023-07-14 DIAGNOSIS — M14672 Charcot's joint, left ankle and foot: Secondary | ICD-10-CM

## 2023-07-14 DIAGNOSIS — G8929 Other chronic pain: Secondary | ICD-10-CM

## 2023-07-18 ENCOUNTER — Other Ambulatory Visit: Payer: Self-pay

## 2023-07-18 DIAGNOSIS — G629 Polyneuropathy, unspecified: Secondary | ICD-10-CM

## 2023-07-19 ENCOUNTER — Other Ambulatory Visit: Payer: Self-pay

## 2023-07-19 ENCOUNTER — Telehealth: Payer: Self-pay

## 2023-07-19 NOTE — Telephone Encounter (Signed)
 The Freestyle Libre 14 day, 2 and 3 are being discontinued in September 2025. The new version is 2 plus and 3 plus and are available. We are highly encouraging providers to go ahead and make the switch now. That will prevent us  from doing the prior auth on the old version now and then having to do a prior auth on the new version again in a few months. Thanks!

## 2023-07-27 DIAGNOSIS — Z4789 Encounter for other orthopedic aftercare: Secondary | ICD-10-CM | POA: Diagnosis not present

## 2023-08-07 ENCOUNTER — Other Ambulatory Visit: Payer: Self-pay

## 2023-08-07 MED ORDER — GLIPIZIDE ER 5 MG PO TB24: 5.0000 mg | ORAL_TABLET | Freq: Every day | ORAL | 3 refills | Status: AC

## 2023-08-09 ENCOUNTER — Ambulatory Visit (INDEPENDENT_AMBULATORY_CARE_PROVIDER_SITE_OTHER): Admitting: Nurse Practitioner

## 2023-08-09 VITALS — BP 118/60 | HR 74 | Temp 98.6°F | Ht 76.0 in

## 2023-08-09 DIAGNOSIS — E119 Type 2 diabetes mellitus without complications: Secondary | ICD-10-CM | POA: Diagnosis not present

## 2023-08-09 DIAGNOSIS — M14672 Charcot's joint, left ankle and foot: Secondary | ICD-10-CM | POA: Diagnosis not present

## 2023-08-09 DIAGNOSIS — I63232 Cerebral infarction due to unspecified occlusion or stenosis of left carotid arteries: Secondary | ICD-10-CM

## 2023-08-09 DIAGNOSIS — M5441 Lumbago with sciatica, right side: Secondary | ICD-10-CM | POA: Diagnosis not present

## 2023-08-09 DIAGNOSIS — Z8673 Personal history of transient ischemic attack (TIA), and cerebral infarction without residual deficits: Secondary | ICD-10-CM

## 2023-08-09 DIAGNOSIS — I10 Essential (primary) hypertension: Secondary | ICD-10-CM | POA: Diagnosis not present

## 2023-08-09 DIAGNOSIS — H539 Unspecified visual disturbance: Secondary | ICD-10-CM

## 2023-08-09 DIAGNOSIS — G8929 Other chronic pain: Secondary | ICD-10-CM | POA: Diagnosis not present

## 2023-08-09 DIAGNOSIS — Z7984 Long term (current) use of oral hypoglycemic drugs: Secondary | ICD-10-CM

## 2023-08-09 DIAGNOSIS — Z7985 Long-term (current) use of injectable non-insulin antidiabetic drugs: Secondary | ICD-10-CM

## 2023-08-09 DIAGNOSIS — E785 Hyperlipidemia, unspecified: Secondary | ICD-10-CM

## 2023-08-09 LAB — POCT GLYCOSYLATED HEMOGLOBIN (HGB A1C)
HbA1c POC (<> result, manual entry): 6.8 % (ref 4.0–5.6)
HbA1c, POC (controlled diabetic range): 6.8 % (ref 0.0–7.0)
HbA1c, POC (prediabetic range): 6.8 % — AB (ref 5.7–6.4)
Hemoglobin A1C: 6.8 % — AB (ref 4.0–5.6)

## 2023-08-09 MED ORDER — FREESTYLE LIBRE 3 PLUS SENSOR MISC: 11 refills | Status: DC

## 2023-08-09 MED ORDER — TRAMADOL HCL 50 MG PO TABS: ORAL_TABLET | ORAL | 2 refills | Status: DC

## 2023-08-09 NOTE — Progress Notes (Unsigned)
  Bluford Burkitt, NP-C Phone: [phone]  Joseph Singh is a 63 y.o. male who presents today for follow up.   ***  Social History   Tobacco Use  Smoking Status Former   Current packs/day: 0.00   Average packs/day: 0.5 packs/day for 35.0 years (17.5 ttl pk-yrs)   Types: Cigarettes   Start date: 08/09/1983   Quit date: 08/09/2018   Years since quitting: 5.0  Smokeless Tobacco Never    Current Outpatient Medications on File Prior to Visit  Medication Sig Dispense Refill   amLODipine  (NORVASC ) 10 MG tablet Take 1 tablet (10 mg total) by mouth daily. 90 tablet 3   aspirin  EC 81 MG tablet Take 1 tablet (81 mg total) by mouth daily. 150 tablet 2   atorvastatin  (LIPITOR) 80 MG tablet Take 1 tablet (80 mg total) by mouth daily. 90 tablet 3   clopidogrel  (PLAVIX ) 75 MG tablet TAKE ONE TABLET BY MOUTH EVERY DAY 90 tablet 3   gabapentin  (NEURONTIN ) 300 MG capsule TAKE 1 CAPSULE BY MOUTH FOUR TIMES DAILY 360 capsule 1   glipiZIDE  (GLUCOTROL  XL) 5 MG 24 hr tablet Take 1 tablet (5 mg total) by mouth daily. 90 tablet 3   JARDIANCE  25 MG TABS tablet TAKE 1 TABLET BY MOUTH ONCE DAILY 90 tablet 3   lisinopril -hydrochlorothiazide  (ZESTORETIC ) 10-12.5 MG tablet Take 1 tablet by mouth daily. 90 tablet 3   metFORMIN  (GLUCOPHAGE ) 1000 MG tablet TAKE ONE (1) TABLET BY MOUTH TWO TIMES PER DAY WITH A MEAL (Patient taking differently: Take 1,000 mg by mouth daily with breakfast.) 180 tablet 1   metoprolol  succinate (TOPROL -XL) 50 MG 24 hr tablet TAKE ONE TABLET BY MOUTH ONCE DAILY WITHOR IMMEDIATELY FOLLOWING A MEAL 90 tablet 1   Multiple Vitamin (MULTIVITAMIN) tablet Take 1 tablet by mouth daily.     tirzepatide  (MOUNJARO ) 10 MG/0.5ML Pen Inject 10 mg into the skin once a week. 6 mL 2   No current facility-administered medications on file prior to visit.     ROS see history of present illness  Objective  Physical Exam Vitals:   08/09/23 1356  BP: 118/60  Pulse: 74  Temp: 98.6 F (37 C)  SpO2:  98%    BP Readings from Last 3 Encounters:  08/09/23 118/60  05/04/23 (!) 140/68  04/10/23 118/68   Wt Readings from Last 3 Encounters:  04/10/23 290 lb (131.5 kg)  03/23/23 288 lb 2 oz (130.7 kg)  02/01/23 (!) 305 lb 14.1 oz (138.7 kg)    Physical Exam   Assessment/Plan: Please see individual problem list.  Essential hypertension  Cerebrovascular accident (CVA) due to stenosis of left carotid artery [name]  Type 2 diabetes mellitus without complication, without long-term current use of insulin  [name] -     POCT glycosylated hemoglobin (Hb A1C) -     FreeStyle Libre 3 Plus Sensor; Change sensor every 15 days.  Dispense: 2 each; Refill: 11  Charcot ankle, left -     traMADol  HCl; TAKE 1 TABLET BY MOUTH EVERY 8 HOURS AS NEEDED FOR MODERATE PAIN  Dispense: 90 tablet; Refill: 2  Hyperlipidemia, unspecified hyperlipidemia type  Chronic right-sided low back pain with right-sided sciatica -     traMADol  HCl; TAKE 1 TABLET BY MOUTH EVERY 8 HOURS AS NEEDED FOR MODERATE PAIN  Dispense: 90 tablet; Refill: 2     Health Maintenance: ***  No follow-ups on file.   Bluford Burkitt, NP-C [facility]

## 2023-08-10 ENCOUNTER — Encounter: Payer: Self-pay | Admitting: Nurse Practitioner

## 2023-08-15 ENCOUNTER — Other Ambulatory Visit: Payer: Self-pay

## 2023-08-15 DIAGNOSIS — E785 Hyperlipidemia, unspecified: Secondary | ICD-10-CM

## 2023-08-15 DIAGNOSIS — Z4789 Encounter for other orthopedic aftercare: Secondary | ICD-10-CM | POA: Diagnosis not present

## 2023-08-15 MED ORDER — ATORVASTATIN CALCIUM 80 MG PO TABS: 80.0000 mg | ORAL_TABLET | Freq: Every day | ORAL | 3 refills | Status: AC

## 2023-08-16 ENCOUNTER — Encounter: Payer: Self-pay | Admitting: Nurse Practitioner

## 2023-08-16 DIAGNOSIS — H539 Unspecified visual disturbance: Secondary | ICD-10-CM | POA: Insufficient documentation

## 2023-08-16 NOTE — Assessment & Plan Note (Signed)
 Minimal weight bearing post surgery. In boot, recent external fixator removal. Follow up with Podiatry and Ortho as scheduled. Consider additional PT when weight bearing.

## 2023-08-16 NOTE — Assessment & Plan Note (Signed)
 Blood pressure is controlled with amlodipine , hydrochlorothiazide , lisinopril , and metoprolol , with recent readings around 118/60 mmHg. Occasional dizziness when standing may be related to blood pressure or medication effects. Continue current antihypertensive medications and monitor blood pressure regularly.

## 2023-08-16 NOTE — Assessment & Plan Note (Signed)
 He is under the care of a vascular surgeon for carotid artery stenosis. Pending surgery for stent placement. A follow-up with Dr. Georgina at [facility] is scheduled. He is on Plavix  and Lipitor and undergoing rehabilitation with a walker. He has completed PT and OT but may need further PT after an orthopedic evaluation. Weakness persists, and he is not fully independent. Maintaining blood pressure and blood sugar control is crucial post-stroke. Continue Plavix  and Lipitor, evaluate the need for further PT after orthopedic consultation, and monitor for any new neurological symptoms. Follow up with the vascular surgeon for potential intervention on the carotid artery.

## 2023-08-16 NOTE — Assessment & Plan Note (Signed)
 Chronic issue. He has been on Tramadol 50 mg every 8 hours as needed for several years. Stable on mediation. Refills sent. PDMP reviewed.

## 2023-08-16 NOTE — Assessment & Plan Note (Signed)
 Diabetes is managed with Jardiance , Glucotrol , metformin , and Mounjaro . Blood glucose averages 120-170 mg/dL, with occasional spikes above 200 mg/dL. An A1c of 6.8% indicates effective management. He reports no excessive thirst, urination, or hypoglycemic episodes, though there have been two instances of nocturnal hypoglycemia. He is not using a CGM due to insurance issues, but a new sensor has been ordered. Check A1c level today, order the new CGM sensor, continue current diabetes medications, and discuss diet and lifestyle modifications.

## 2023-08-16 NOTE — Assessment & Plan Note (Signed)
 He experiences intermittent blurry vision in the right eye, particularly in bright environments. An optometrist exam showed no abnormalities. Symptoms may relate to diabetes or past stroke. Consider referral to an ophthalmologist if symptoms persist.

## 2023-08-17 ENCOUNTER — Other Ambulatory Visit (HOSPITAL_COMMUNITY): Payer: Self-pay

## 2023-08-17 ENCOUNTER — Telehealth: Payer: Self-pay

## 2023-08-17 NOTE — Telephone Encounter (Signed)
 Pharmacy Patient Advocate Encounter   Received notification from CoverMyMeds that prior authorization for Freestyle Libre 3 Plus sensor is required/requested.   Insurance verification completed.   The patient is insured through [facility] Medicare Part D .   Per test claim: PA required; PA submitted to above mentioned insurance via CoverMyMeds Key/confirmation #/EOC [id number] Status is pending

## 2023-08-18 NOTE — Telephone Encounter (Signed)
 Called and relayed information to pt and asked him if he would like for us  to send the sensors [facility] to see if they can bill against his medicare part b he stated he will talk with his wife and give us  a call back

## 2023-08-18 NOTE — Telephone Encounter (Signed)
 Placed a call to [facility] to see if they could bill the sensors to Medicare Part B. Per Isaiah, they are not set up to bill to Part B, the patient would have to get it filled at a [facility] or [facility].

## 2023-08-18 NOTE — Telephone Encounter (Signed)
 Pharmacy Patient Advocate Encounter  Received notification from [employer] that Prior Authorization for Freestyle Libre 3 Plus Sensor has been DENIED.  See denial reason below. No denial letter attached in CMM. Will attach denial letter to Media tab once received.   PA #/Case ID/Reference #: [id number]

## 2023-09-11 ENCOUNTER — Other Ambulatory Visit: Payer: Self-pay

## 2023-09-11 DIAGNOSIS — I10 Essential (primary) hypertension: Secondary | ICD-10-CM

## 2023-09-11 MED ORDER — METOPROLOL SUCCINATE ER 50 MG PO TB24: ORAL_TABLET | ORAL | 1 refills | Status: DC

## 2023-09-21 DIAGNOSIS — M14672 Charcot's joint, left ankle and foot: Secondary | ICD-10-CM | POA: Diagnosis not present

## 2023-09-21 DIAGNOSIS — Z4789 Encounter for other orthopedic aftercare: Secondary | ICD-10-CM | POA: Diagnosis not present

## 2023-09-21 DIAGNOSIS — I10 Essential (primary) hypertension: Secondary | ICD-10-CM | POA: Diagnosis not present

## 2023-09-26 ENCOUNTER — Ambulatory Visit (INDEPENDENT_AMBULATORY_CARE_PROVIDER_SITE_OTHER): Admitting: *Deleted

## 2023-09-26 VITALS — Ht 76.0 in | Wt 300.0 lb

## 2023-09-26 DIAGNOSIS — Z0000 Encounter for general adult medical examination without abnormal findings: Secondary | ICD-10-CM | POA: Diagnosis not present

## 2023-09-26 NOTE — Progress Notes (Signed)
 Subjective:   Joseph Singh is a 63 y.o. who presents for a Medicare Wellness preventive visit.  As a reminder, Annual Wellness Visits don't include a physical exam, and some assessments may be limited, especially if this visit is performed virtually. We may recommend an in-person follow-up visit with your provider if needed.  Visit Complete: Virtual I connected with  Joseph Singh on 09/26/23 by a audio enabled telemedicine application and verified that I am speaking with the correct person using two identifiers.  Patient Location: Home  Provider Location: Home Office  I discussed the limitations of evaluation and management by telemedicine. The patient expressed understanding and agreed to proceed.  Vital Signs: Because this visit was a virtual/telehealth visit, some criteria may be missing or patient reported. Any vitals not documented were not able to be obtained and vitals that have been documented are patient reported.  VideoDeclined- This patient declined Librarian, academic. Therefore the visit was completed with audio only.  Persons Participating in Visit: Patient.  AWV Questionnaire: No: Patient Medicare AWV questionnaire was not completed prior to this visit.  Cardiac Risk Factors include: advanced age (>25men, >63 women);diabetes mellitus;dyslipidemia;male gender;hypertension;obesity (BMI >30kg/m2);Other (see comment), Risk factor comments: H/O tobacco     Objective:    Today's Vitals   09/26/23 0812  Weight: 300 lb (136.1 kg)  Height: 6' 4 (1.93 m)   Body mass index is 36.52 kg/m.     09/26/2023    8:32 AM 07/04/2022   10:33 AM 04/23/2022   12:40 PM 04/22/2022   10:30 AM 04/14/2022   11:18 AM 03/30/2022   11:00 PM 03/30/2022    4:21 PM  Advanced Directives  Does Patient Have a Medical Advance Directive? No No No No No No No  Would patient like information on creating a medical advance directive? No - Patient declined No - Patient  declined  No - Patient declined  No - Patient declined No - Patient declined    Current Medications (verified) Outpatient Encounter Medications as of 09/26/2023  Medication Sig   amLODipine  (NORVASC ) 10 MG tablet Take 1 tablet (10 mg total) by mouth daily.   aspirin  EC 81 MG tablet Take 1 tablet (81 mg total) by mouth daily.   atorvastatin  (LIPITOR) 80 MG tablet Take 1 tablet (80 mg total) by mouth daily.   clopidogrel  (PLAVIX ) 75 MG tablet TAKE ONE TABLET BY MOUTH EVERY DAY   gabapentin  (NEURONTIN ) 300 MG capsule TAKE 1 CAPSULE BY MOUTH FOUR TIMES DAILY   glipiZIDE  (GLUCOTROL  XL) 5 MG 24 hr tablet Take 1 tablet (5 mg total) by mouth daily.   JARDIANCE  25 MG TABS tablet TAKE 1 TABLET BY MOUTH ONCE DAILY   lisinopril -hydrochlorothiazide  (ZESTORETIC ) 10-12.5 MG tablet Take 1 tablet by mouth daily.   metFORMIN  (GLUCOPHAGE ) 1000 MG tablet TAKE ONE (1) TABLET BY MOUTH TWO TIMES PER DAY WITH A MEAL (Patient taking differently: Take 1,000 mg by mouth daily with breakfast.)   metoprolol  succinate (TOPROL -XL) 50 MG 24 hr tablet TAKE ONE TABLET BY MOUTH ONCE DAILY WITHOR IMMEDIATELY FOLLOWING A MEAL   Multiple Vitamin (MULTIVITAMIN) tablet Take 1 tablet by mouth daily.   tirzepatide  (MOUNJARO ) 10 MG/0.5ML Pen Inject 10 mg into the skin once a week.   traMADol  (ULTRAM ) 50 MG tablet TAKE 1 TABLET BY MOUTH EVERY 8 HOURS AS NEEDED FOR MODERATE PAIN   Continuous Glucose Sensor (FREESTYLE LIBRE 3 PLUS SENSOR) MISC Change sensor every 15 days.   No facility-administered encounter  medications on file as of 09/26/2023.    Allergies (verified) Cymbalta  [duloxetine  hcl]   History: Past Medical History:  Diagnosis Date   Anemia    Arthritis    Diabetes mellitus without complication [name]    History of kidney stones    h/o   Hyperlipidemia    Hypertension    Neuromuscular disorder [name]    Neuropathy    Past Surgical History:  Procedure Laterality Date   AMPUTATION TOE Left 08/10/2018   Procedure:  RAY LEFT;  Surgeon: Ashley Soulier, DPM;  Location: [facility];  Service: Podiatry;  Laterality: Left;   ANKLE SURGERY Left 03/2023   ARTHRODESIS METATARSAL Left 02/01/2019   Procedure: ARTHRODESIS METATARSAL;  Surgeon: Ashley Soulier, DPM;  Location: [facility];  Service: Podiatry;  Laterality: Left;   CYSTOSCOPY/URETEROSCOPY/HOLMIUM LASER/STENT PLACEMENT Right 04/01/2022   Procedure: CYSTOSCOPY/URETEROSCOPY/HOLMIUM LASER/STENT PLACEMENT;  Surgeon: Francisca Redell BROCKS, MD;  Location: [facility];  Service: Urology;  Laterality: Right;   CYSTOSCOPY/URETEROSCOPY/HOLMIUM LASER/STENT PLACEMENT Right 04/22/2022   Procedure: CYSTOSCOPY/URETEROSCOPY/HOLMIUM LASER/STENT EXCHANGE;  Surgeon: Francisca Redell BROCKS, MD;  Location: [facility];  Service: Urology;  Laterality: Right;   FOOT ARTHRODESIS Left 02/01/2019   Procedure: ARTHRODESIS FOOT;STJ;  Surgeon: Ashley Soulier, DPM;  Location: [facility];  Service: Podiatry;  Laterality: Left;   IRRIGATION AND DEBRIDEMENT FOOT Left 10/26/2018   Procedure: IRRIGATION AND DEBRIDEMENT FOOT, LEFT;  Surgeon: Ashley Soulier, DPM;  Location: [facility];  Service: Podiatry;  Laterality: Left;   LOWER EXTREMITY ANGIOGRAPHY Right 02/25/2016   Procedure: Lower Extremity Angiography;  Surgeon: Selinda GORMAN Gu, MD;  Location: [facility];  Service: Cardiovascular;  Laterality: Right;   LOWER EXTREMITY ANGIOGRAPHY Right 02/27/2017   Procedure: LOWER EXTREMITY ANGIOGRAPHY;  Surgeon: Gu Selinda GORMAN, MD;  Location: [facility];  Service: Cardiovascular;  Laterality: Right;   LOWER EXTREMITY ANGIOGRAPHY Left 08/06/2018   Procedure: LOWER EXTREMITY ANGIOGRAPHY;  Surgeon: Gu Selinda GORMAN, MD;  Location: [facility];  Service: Cardiovascular;  Laterality: Left;   LOWER EXTREMITY INTERVENTION  02/25/2016   Procedure: Lower Extremity Intervention;  Surgeon: Selinda GORMAN Gu, MD;  Location: [facility];  Service: Cardiovascular;;   SHOULDER OPEN ROTATOR CUFF REPAIR Right    2012    TOE AMPUTATION     Family History  Problem Relation Age of Onset   Diabetes Mother    Heart disease Father    Diabetes Sister    Diabetes Brother    Cancer Sister        lung   Cancer Sister        breast   Social History   Socioeconomic History   Marital status: Married    Spouse name: Not on file   Number of children: 1   Years of education: Not on file   Highest education level: GED or equivalent  Occupational History   Not on file  Tobacco Use   Smoking status: Former    Current packs/day: 0.00    Average packs/day: 0.5 packs/day for 35.0 years (17.5 ttl pk-yrs)    Types: Cigarettes    Start date: 08/09/1983    Quit date: 08/09/2018    Years since quitting: 5.1   Smokeless tobacco: Never  Vaping Use   Vaping status: Never Used  Substance and Sexual Activity   Alcohol use: No    Alcohol/week: 0.0 standard drinks of alcohol   Drug use: No   Sexual activity: Yes    Partners: Female    Comment: Wife  Other Topics Concern   Not on file  Social History Narrative   Permanently disabled   [employer] on the job and hurt his shoulder    Lives with wife and 2 children    1 grandchild from a previous marriage    Pets: Not inside    GED    Right handed    Caffeine- 2-3 sodas, tea, no coffee    Enjoys watching tv and being outside    Social Drivers of [employer] Strain: Low Risk  (09/26/2023)   Overall Financial Resource Strain (CARDIA)    Difficulty of Paying Living Expenses: Not hard at all  Food Insecurity: No Food Insecurity (09/26/2023)   Hunger Vital Sign    Worried About Running Out of Food in the Last Year: Never true    Ran Out of Food in the Last Year: Never true  Transportation Needs: No Transportation Needs (09/26/2023)   PRAPARE - Administrator, Civil Service (Medical): No    Lack of Transportation (Non-Medical): No  Physical Activity: Inactive (09/26/2023)   Exercise Vital Sign    Days of Exercise per Week: 0 days    Minutes of  Exercise per Session: 0 min  Stress: No Stress Concern Present (09/26/2023)   [employer] of Occupational Health - Occupational Stress Questionnaire    Feeling of Stress: Not at all  Social Connections: Moderately Integrated (09/26/2023)   Social Connection and Isolation Panel    Frequency of Communication with Friends and Family: More than three times a week    Frequency of Social Gatherings with Friends and Family: Twice a week    Attends Religious Services: Never    Database administrator or Organizations: Yes    Attends Banker Meetings: Never    Marital Status: Married    Tobacco Counseling Counseling given: Not Answered    Clinical Intake:  Pre-visit preparation completed: Yes  Pain : No/denies pain     BMI - recorded: 36.52 Nutritional Status: BMI > 30  Obese Nutritional Risks: None Diabetes: Yes CBG done?: No  Lab Results  Component Value Date   HGBA1C 6.8 (A) 08/09/2023   HGBA1C 6.8 08/09/2023   HGBA1C 6.8 (A) 08/09/2023   HGBA1C 6.8 08/09/2023     How often do you need to have someone help you when you read instructions, pamphlets, or other written materials from your doctor or pharmacy?: 1 - Never  Interpreter Needed?: No  Information entered by :: R. Deamber Buckhalter LPN   Activities of Daily Living     09/26/2023    8:14 AM  In your present state of health, do you have any difficulty performing the following activities:  Hearing? 0  Vision? 0  Difficulty concentrating or making decisions? 0  Walking or climbing stairs? 1  Dressing or bathing? 0  Doing errands, shopping? 0  Preparing Food and eating ? N  Using the Toilet? N  In the past six months, have you accidently leaked urine? N  Do you have problems with loss of bowel control? N  Managing your Medications? N  Managing your Finances? N  Housekeeping or managing your Housekeeping? Y  Comment recovering from surgery    Patient Care Team: Gretel App, NP as PCP - General (Nurse  Practitioner) Gunnar, Evalene LABOR, DPM as Referring Physician (Podiatry)  I have updated your Care Teams any recent Medical Services you may have received from other providers in the past year.  Assessment:   This is a routine wellness examination for [facility].  Hearing/Vision screen Hearing Screening - Comments:: No issues Vision Screening - Comments:: No correction   Goals Addressed             This Visit's Progress    Patient Stated       Wants to be able to walk without a walker        Depression Screen     09/26/2023    8:25 AM 02/01/2023    8:28 AM 12/13/2022    9:46 AM 09/12/2022   10:14 AM 07/04/2022   10:29 AM 07/04/2022   10:28 AM 06/13/2022   10:19 AM  PHQ 2/9 Scores  PHQ - 2 Score 0 0 0 0 0 0 0  PHQ- 9 Score 1 0 0 0 0 0 0    Fall Risk     09/26/2023    8:19 AM 02/01/2023    8:27 AM 12/13/2022    9:46 AM 09/12/2022   10:13 AM 06/30/2022    9:45 AM  Fall Risk   Falls in the past year? 1 0 0 0 0  Number falls in past yr: 0 0 0 0 0  Injury with Fall? 0 0 0 0 0  Risk for fall due to : History of fall(s);Impaired balance/gait No Fall Risks No Fall Risks No Fall Risks No Fall Risks  Follow up Falls evaluation completed;Falls prevention discussed Falls evaluation completed Falls evaluation completed Falls evaluation completed Falls prevention discussed;Falls evaluation completed    MEDICARE RISK AT HOME:  Medicare Risk at Home Any stairs in or around the home?: Yes If so, are there any without handrails?: No Home free of loose throw rugs in walkways, pet beds, electrical cords, etc?: Yes Adequate lighting in your home to reduce risk of falls?: Yes Life alert?: No Use of a cane, walker or w/c?: Yes Grab bars in the bathroom?: No Shower chair or bench in shower?: Yes Elevated toilet seat or a handicapped toilet?: No  TIMED UP AND GO:  Was the test performed?  No  Cognitive Function: 6CIT completed    01/19/2017    9:44 AM  MMSE - Mini Mental State  Exam  Orientation to time 5   Orientation to Place 5   Registration 3   Attention/ Calculation 5   Recall 3   Language- name 2 objects 2   Language- repeat 1  Language- follow 3 step command 3   Language- read & follow direction 1   Write a sentence 1   Copy design 1   Total score 30      Data saved with a previous flowsheet row definition        09/26/2023    8:32 AM 07/04/2022   10:30 AM 06/18/2020    9:10 AM 06/18/2019    9:20 AM 01/29/2018    9:32 AM  6CIT Screen  What Year? 0 points 0 points 0 points 0 points 0 points  What month? 0 points 0 points 0 points 0 points 0 points  What time? 0 points 0 points 0 points 0 points 0 points  Count back from 20 0 points 0 points 0 points 0 points 0 points  Months in reverse 0 points 0 points 0 points 0 points 0 points  Repeat phrase 0 points 0 points 0 points    Total Score 0 points 0 points 0 points      Immunizations Immunization History  Administered Date(s) Administered  Influenza,inj,Quad PF,6+ Mos 12/11/2017, 10/22/2018    Screening Tests Health Maintenance  Topic Date Due   DTaP/Tdap/Td (1 - Tdap) Never done   Pneumococcal Vaccine: 19-49 Years (1 of 2 - PCV) Never done   Pneumococcal Vaccine: 50+ Years (1 of 2 - PCV) Never done   Zoster Vaccines- Shingrix (1 of 2) Never done   Colonoscopy  02/13/2019   OPHTHALMOLOGY EXAM  01/12/2022   FOOT EXAM  07/01/2022   Diabetic kidney evaluation - Urine ACR  02/18/2023   Medicare Annual Wellness (AWV)  07/04/2023   INFLUENZA VACCINE  09/22/2023   HEMOGLOBIN A1C  02/08/2024   Diabetic kidney evaluation - eGFR measurement  04/09/2024   Hepatitis C Screening  Completed   HIV Screening  Completed   Hepatitis B Vaccines  Aged Out   HPV VACCINES  Aged Out   Meningococcal B Vaccine  Aged Out   COVID-19 Vaccine  Discontinued    Health Maintenance  Health Maintenance Due  Topic Date Due   DTaP/Tdap/Td (1 - Tdap) Never done   Pneumococcal Vaccine: 19-49 Years (1 of 2 - PCV)  Never done   Pneumococcal Vaccine: 50+ Years (1 of 2 - PCV) Never done   Zoster Vaccines- Shingrix (1 of 2) Never done   Colonoscopy  02/13/2019   OPHTHALMOLOGY EXAM  01/12/2022   FOOT EXAM  07/01/2022   Diabetic kidney evaluation - Urine ACR  02/18/2023   Medicare Annual Wellness (AWV)  07/04/2023   INFLUENZA VACCINE  09/22/2023   Health Maintenance Items Addressed: Patient declines vaccines. Patient declines colonoscopy and is not interested in a lung cancer screening.  Patient needs a diabetic foot exam completed and documented at next visit.  Additional Screening:  Vision Screening: Recommended annual ophthalmology exams for early detection of glaucoma and other disorders of the eye. Up to date  [facility]  Will send for last diabetic eye exam notes Would you like a referral to an eye doctor? No    Dental Screening: Recommended annual dental exams for proper oral hygiene  Community Resource Referral / Chronic Care Management: CRR required this visit?  No   CCM required this visit?  No   Plan:    I have personally reviewed and noted the following in the patient's chart:   Medical and social history Use of alcohol, tobacco or illicit drugs  Current medications and supplements including opioid prescriptions. Patient is currently taking opioid prescriptions. Information provided to patient regarding non-opioid alternatives. Patient advised to discuss non-opioid treatment plan with their provider. Functional ability and status Nutritional status Physical activity Advanced directives List of other physicians Hospitalizations, surgeries, and ER visits in previous 12 months Vitals Screenings to include cognitive, depression, and falls Referrals and appointments  In addition, I have reviewed and discussed with patient certain preventive protocols, quality metrics, and best practice recommendations. A written personalized care plan for preventive services as well as general  preventive health recommendations were provided to patient.   Angeline Fredericks, LPN   02/25/7972   After Visit Summary: (MyChart) Due to this being a telephonic visit, the after visit summary with patients personalized plan was offered to patient via MyChart   Notes: Nothing significant to report at this time.

## 2023-09-26 NOTE — Patient Instructions (Signed)
 Joseph Singh , Thank you for taking time out of your busy schedule to complete your Annual Wellness Visit with me. I enjoyed our conversation and look forward to speaking with you again next year. I, as well as your care team,  appreciate your ongoing commitment to your health goals. Please review the following plan we discussed and let me know if I can assist you in the future. Your Game plan/ To Do List    Referrals: If you haven't heard from the office you've been referred to, please reach out to them at the phone provided.  Consider updating your vaccines and having a colonoscopy.   Follow up Visits: We will see or speak with you next year for your Next Medicare AWV with our clinical staff 09/30/24 @ 9:30 Have you seen your provider in the last 6 months (3 months if uncontrolled diabetes)? Yes  Clinician Recommendations:  Aim for 30 minutes of exercise or brisk walking, 6-8 glasses of water, and 5 servings of fruits and vegetables each day.       This is a list of the screenings recommended for you:  Health Maintenance  Topic Date Due   DTaP/Tdap/Td vaccine (1 - Tdap) Never done   Pneumococcal Vaccine for high risk medical condition (1 of 2 - PCV) Never done   Pneumococcal Vaccine for age over 54 (1 of 2 - PCV) Never done   Zoster (Shingles) Vaccine (1 of 2) Never done   Colon Cancer Screening  02/13/2019   Eye exam for diabetics  01/12/2022   Complete foot exam   07/01/2022   Yearly kidney health urinalysis for diabetes  02/18/2023   Flu Shot  09/22/2023   Hemoglobin A1C  02/08/2024   Yearly kidney function blood test for diabetes  04/09/2024   Medicare Annual Wellness Visit  09/25/2024   Hepatitis C Screening  Completed   HIV Screening  Completed   Hepatitis B Vaccine  Aged Out   HPV Vaccine  Aged Out   Meningitis B Vaccine  Aged Out   COVID-19 Vaccine  Discontinued    Advanced directives: (Declined) Advance directive discussed with you today. Even though you declined this  today, please call our office should you change your mind, and we can give you the proper paperwork for you to fill out. Advance Care Planning is important because it:  [x]  Makes sure you receive the medical care that is consistent with your values, goals, and preferences  [x]  It provides guidance to your family and loved ones and reduces their decisional burden about whether or not they are making the right decisions based on your wishes.  Managing Pain Without Opioids Opioids are strong medicines used to treat moderate to severe pain. For some people, especially those who have long-term (chronic) pain, opioids may not be the best choice for pain management due to: Side effects like nausea, constipation, and sleepiness. The risk of addiction (opioid use disorder). The longer you take opioids, the greater your risk of addiction. Pain that lasts for more than 3 months is called chronic pain. Managing chronic pain usually requires more than one approach and is often provided by a team of health care providers working together (multidisciplinary approach). Pain management may be done at a pain management center or pain clinic. How to manage pain without the use of opioids Use non-opioid medicines Non-opioid medicines for pain may include: Over-the-counter or prescription non-steroidal anti-inflammatory drugs (NSAIDs). These may be the first medicines used for pain. They work  well for muscle and bone pain, and they reduce swelling. Acetaminophen . This over-the-counter medicine may work well for milder pain but not swelling. Antidepressants. These may be used to treat chronic pain. A certain type of antidepressant (tricyclics) is often used. These medicines are given in lower doses for pain than when used for depression. Anticonvulsants. These are usually used to treat seizures but may also reduce nerve (neuropathic) pain. Muscle relaxants. These relieve pain caused by sudden muscle tightening  (spasms). You may also use a pain medicine that is applied to the skin as a patch, cream, or gel (topical analgesic), such as a numbing medicine. These may cause fewer side effects than medicines taken by mouth. Do certain therapies as directed Some therapies can help with pain management. They include: Physical therapy. You will do exercises to gain strength and flexibility. A physical therapist may teach you exercises to move and stretch parts of your body that are weak, stiff, or painful. You can learn these exercises at physical therapy visits and practice them at home. Physical therapy may also involve: Massage. Heat wraps or applying heat or cold to affected areas. Electrical signals that interrupt pain signals (transcutaneous electrical nerve stimulation, TENS). Weak lasers that reduce pain and swelling (low-level laser therapy). Signals from your body that help you learn to regulate pain (biofeedback). Occupational therapy. This helps you to learn ways to function at home and work with less pain. Recreational therapy. This involves trying new activities or hobbies, such as a physical activity or drawing. Mental health therapy, including: Cognitive behavioral therapy (CBT). This helps you learn coping skills for dealing with pain. Acceptance and commitment therapy (ACT) to change the way you think and react to pain. Relaxation therapies, including muscle relaxation exercises and mindfulness-based stress reduction. Pain management counseling. This may be individual, family, or group counseling.  Receive medical treatments Medical treatments for pain management include: Nerve block injections. These may include a pain blocker and anti-inflammatory medicines. You may have injections: Near the spine to relieve chronic back or neck pain. Into joints to relieve back or joint pain. Into nerve areas that supply a painful area to relieve body pain. Into muscles (trigger point injections) to  relieve some painful muscle conditions. A medical device placed near your spine to help block pain signals and relieve nerve pain or chronic back pain (spinal cord stimulation device). Acupuncture. Follow these instructions at home Medicines Take over-the-counter and prescription medicines only as told by your health care provider. If you are taking pain medicine, ask your health care providers about possible side effects to watch out for. Do not drive or use heavy machinery while taking prescription opioid pain medicine. Lifestyle  Do not use drugs or alcohol to reduce pain. If you drink alcohol, limit how much you have to: 0-1 drink a day for women who are not pregnant. 0-2 drinks a day for men. Know how much alcohol is in a drink. In the [place] one drink equals one 12 oz bottle of beer (355 mL), one 5 oz glass of wine (148 mL), or one 1 oz glass of hard liquor (44 mL). Do not use any products that contain nicotine or tobacco. These products include cigarettes, chewing tobacco, and vaping devices, such as e-cigarettes. If you need help quitting, ask your health care provider. Eat a healthy diet and maintain a healthy weight. Poor diet and excess weight may make pain worse. Eat foods that are high in fiber. These include fresh fruits and vegetables, whole  grains, and beans. Limit foods that are high in fat and processed sugars, such as fried and sweet foods. Exercise regularly. Exercise lowers stress and may help relieve pain. Ask your health care provider what activities and exercises are safe for you. If your health care provider approves, join an exercise class that combines movement and stress reduction. Examples include yoga and tai chi. Get enough sleep. Lack of sleep may make pain worse. Lower stress as much as possible. Practice stress reduction techniques as told by your therapist. General instructions Work with all your pain management providers to find the treatments that work  best for you. You are an important member of your pain management team. There are many things you can do to reduce pain on your own. Consider joining an online or in-person support group for people who have chronic pain. Keep all follow-up visits. This is important. Where to find more information You can find more information about managing pain without opioids from: American Academy of Pain Medicine: painmed.org Institute for Chronic Pain: instituteforchronicpain.org American Chronic Pain Association: theacpa.org Contact a health care provider if: You have side effects from pain medicine.  Your pain gets worse or does not get better with treatments or home therapy. You are struggling with anxiety or depression. Summary Many types of pain can be managed without opioids. Chronic pain may respond better to pain management without opioids. Pain is best managed when you and a team of health care providers work together. Pain management without opioids may include non-opioid medicines, medical treatments, physical therapy, mental health therapy, and lifestyle changes. Tell your health care providers if your pain gets worse or is not being managed well enough. This information is not intended to replace advice given to you by your health care provider. Make sure you discuss any questions you have with your health care provider. Document Revised: 05/20/2020 Document Reviewed: 05/20/2020 Elsevier Patient Education  2024 [employer].

## 2023-09-28 DIAGNOSIS — Z8673 Personal history of transient ischemic attack (TIA), and cerebral infarction without residual deficits: Secondary | ICD-10-CM | POA: Diagnosis not present

## 2023-09-28 DIAGNOSIS — I10 Essential (primary) hypertension: Secondary | ICD-10-CM | POA: Diagnosis not present

## 2023-10-13 ENCOUNTER — Other Ambulatory Visit: Payer: Self-pay

## 2023-10-13 DIAGNOSIS — G629 Polyneuropathy, unspecified: Secondary | ICD-10-CM

## 2023-10-13 MED ORDER — CLOPIDOGREL BISULFATE 75 MG PO TABS: ORAL_TABLET | ORAL | 3 refills | Status: AC

## 2023-10-13 MED ORDER — GABAPENTIN 300 MG PO CAPS: 300.0000 mg | ORAL_CAPSULE | Freq: Four times a day (QID) | ORAL | 1 refills | Status: DC

## 2023-10-19 DIAGNOSIS — I739 Peripheral vascular disease, unspecified: Secondary | ICD-10-CM | POA: Diagnosis not present

## 2023-10-19 DIAGNOSIS — I10 Essential (primary) hypertension: Secondary | ICD-10-CM | POA: Diagnosis not present

## 2023-10-19 DIAGNOSIS — I6523 Occlusion and stenosis of bilateral carotid arteries: Secondary | ICD-10-CM | POA: Diagnosis not present

## 2023-10-20 DIAGNOSIS — Z01810 Encounter for preprocedural cardiovascular examination: Secondary | ICD-10-CM | POA: Diagnosis not present

## 2023-10-20 DIAGNOSIS — I10 Essential (primary) hypertension: Secondary | ICD-10-CM | POA: Diagnosis not present

## 2023-10-20 DIAGNOSIS — I69351 Hemiplegia and hemiparesis following cerebral infarction affecting right dominant side: Secondary | ICD-10-CM | POA: Diagnosis not present

## 2023-10-20 DIAGNOSIS — R42 Dizziness and giddiness: Secondary | ICD-10-CM | POA: Diagnosis not present

## 2023-10-20 DIAGNOSIS — I6521 Occlusion and stenosis of right carotid artery: Secondary | ICD-10-CM | POA: Diagnosis not present

## 2023-10-20 DIAGNOSIS — Z01812 Encounter for preprocedural laboratory examination: Secondary | ICD-10-CM | POA: Diagnosis not present

## 2023-10-20 DIAGNOSIS — E1169 Type 2 diabetes mellitus with other specified complication: Secondary | ICD-10-CM | POA: Diagnosis not present

## 2023-10-20 DIAGNOSIS — Z7984 Long term (current) use of oral hypoglycemic drugs: Secondary | ICD-10-CM | POA: Diagnosis not present

## 2023-10-20 DIAGNOSIS — Z87891 Personal history of nicotine dependence: Secondary | ICD-10-CM | POA: Diagnosis not present

## 2023-10-20 DIAGNOSIS — Z7982 Long term (current) use of aspirin: Secondary | ICD-10-CM | POA: Diagnosis not present

## 2023-10-20 DIAGNOSIS — Z5181 Encounter for therapeutic drug level monitoring: Secondary | ICD-10-CM | POA: Diagnosis not present

## 2023-10-20 DIAGNOSIS — Z794 Long term (current) use of insulin: Secondary | ICD-10-CM | POA: Diagnosis not present

## 2023-10-20 DIAGNOSIS — Z7901 Long term (current) use of anticoagulants: Secondary | ICD-10-CM | POA: Diagnosis not present

## 2023-10-20 DIAGNOSIS — Z7985 Long-term (current) use of injectable non-insulin antidiabetic drugs: Secondary | ICD-10-CM | POA: Diagnosis not present

## 2023-10-20 DIAGNOSIS — Z01818 Encounter for other preprocedural examination: Secondary | ICD-10-CM | POA: Diagnosis not present

## 2023-10-20 DIAGNOSIS — E119 Type 2 diabetes mellitus without complications: Secondary | ICD-10-CM | POA: Diagnosis not present

## 2023-10-30 DIAGNOSIS — I69351 Hemiplegia and hemiparesis following cerebral infarction affecting right dominant side: Secondary | ICD-10-CM | POA: Diagnosis not present

## 2023-10-30 DIAGNOSIS — Z87891 Personal history of nicotine dependence: Secondary | ICD-10-CM | POA: Diagnosis not present

## 2023-10-30 DIAGNOSIS — E1151 Type 2 diabetes mellitus with diabetic peripheral angiopathy without gangrene: Secondary | ICD-10-CM | POA: Diagnosis not present

## 2023-10-30 DIAGNOSIS — I6521 Occlusion and stenosis of right carotid artery: Secondary | ICD-10-CM | POA: Diagnosis not present

## 2023-10-30 DIAGNOSIS — Z7984 Long term (current) use of oral hypoglycemic drugs: Secondary | ICD-10-CM | POA: Diagnosis not present

## 2023-10-30 DIAGNOSIS — G4733 Obstructive sleep apnea (adult) (pediatric): Secondary | ICD-10-CM | POA: Diagnosis not present

## 2023-10-30 DIAGNOSIS — Z89422 Acquired absence of other left toe(s): Secondary | ICD-10-CM | POA: Diagnosis not present

## 2023-10-30 DIAGNOSIS — E1140 Type 2 diabetes mellitus with diabetic neuropathy, unspecified: Secondary | ICD-10-CM | POA: Diagnosis not present

## 2023-10-30 DIAGNOSIS — Z7985 Long-term (current) use of injectable non-insulin antidiabetic drugs: Secondary | ICD-10-CM | POA: Diagnosis not present

## 2023-10-30 DIAGNOSIS — E119 Type 2 diabetes mellitus without complications: Secondary | ICD-10-CM | POA: Diagnosis not present

## 2023-10-30 DIAGNOSIS — Z95820 Peripheral vascular angioplasty status with implants and grafts: Secondary | ICD-10-CM | POA: Diagnosis not present

## 2023-10-30 DIAGNOSIS — I10 Essential (primary) hypertension: Secondary | ICD-10-CM | POA: Diagnosis not present

## 2023-10-30 DIAGNOSIS — Z8673 Personal history of transient ischemic attack (TIA), and cerebral infarction without residual deficits: Secondary | ICD-10-CM | POA: Diagnosis not present

## 2023-10-30 DIAGNOSIS — I6523 Occlusion and stenosis of bilateral carotid arteries: Secondary | ICD-10-CM | POA: Diagnosis not present

## 2023-10-30 DIAGNOSIS — E785 Hyperlipidemia, unspecified: Secondary | ICD-10-CM | POA: Diagnosis not present

## 2023-10-30 DIAGNOSIS — I2510 Atherosclerotic heart disease of native coronary artery without angina pectoris: Secondary | ICD-10-CM | POA: Diagnosis not present

## 2023-10-31 NOTE — Discharge Summary (Signed)
 ------------------------------------------------------------------------------- Attestation signed by Joseph GORMAN Ada, MD at 10/31/23 1239 Seen and agree.  Doing very well this morning.  Right neck wound without hematoma.  Neurologic exam is normal.  Should be okay for discharge. -------------------------------------------------------------------------------  [facility] HEALTH [facility]  [facility] In[facility] Discharge Summary  PCP: Joseph Her, MD Discharge Details   Admit date:         10/30/2023 Discharge date:        10/31/2023  Hospital Days:    1 days  Code Status:   Full Code Advanced Directives on file: No Directive        Discharge Diagnoses:  Principal Problem:   Carotid stenosis, right Active Problems:   Left carotid artery occlusion   Task list for follow-up:    Follow-Up Appointments Suggested: No follow-up provider specified. Follow-Up Appointments Already Scheduled: Future Appointments  Date Time Provider Department Center  12/14/2023  1:00 PM [facility] VASC DIAG [address] RM 3 [facility] SUR None  12/14/2023  2:00 PM Joseph GORMAN Ada, MD VS VAS SUR None  02/29/2024  1:30 PM Joseph Debby Sis, FNP NSG None    Discharge Medications:   Current Discharge Medication List     CONTINUE these medications which have NOT CHANGED      Details  amLODIPine  besylate 10 mg tablet Commonly known as: NORVASC   Take one tablet (10 mg dose) by mouth every morning.   aspirin  81 mg chewable tablet  Chew one tablet (81 mg dose) by mouth every morning.   atorvastatin  80 mg tablet Commonly known as: LIPITOR  Take one tablet (80 mg dose) by mouth every morning.   clopidogrel  bisulfate 75 mg tablet Commonly known as: PLAVIX   Take one tablet (75 mg dose) by mouth every morning. LAST DOSE 07/06/23 AM   empagliflozin  25 mg Tabs tablet Commonly known as: JARDIANCE   Take one tablet (25 mg dose) by mouth every morning. Last dose 06/05/23   gabapentin  300 mg  capsule Commonly known as: NEURONTIN   Take one capsule (300 mg dose) by mouth 3 (three) times a day.   glipiZIDE  5 mg tablet Commonly known as: GLUCOTROL   Take one tablet (5 mg dose) by mouth every morning.   lisinopril -hydrochlorothiazide  10-12.5 MG per tablet Commonly known as: PRINZIDE ,ZESTORETIC   Take one tablet by mouth every morning.   metformin  1000 MG tablet Commonly known as: GLUCOPHAGE   Take one tablet (1,000 mg dose) by mouth with breakfast.   metoprolol  succinate 50 mg 24 hr tablet Commonly known as: TOPROL -XL  Take one tablet (50 mg dose) by mouth every morning.   MOUNJARO  10 MG/0.5ML Soaj injection Generic drug: tirzepatide   Inject 0.5 mLs (10 mg dose) into the skin once a week. MONDAYS   multivitamin tablet  Take one tablet by mouth daily.   naloxone nasal spray (TAKE HOME PACK) Commonly known as: NARCAN  one spray by Intranasal route once as needed for Opioid Reversal for up to 30 days. Quantity: 1 each   traMADol  50 mg tablet Commonly known as: ULTRAM   Take one tablet (50 mg dose) by mouth 3 (three) times a day as needed for Pain for up to 7 days. Max Daily Amount: 150 mg Quantity: 21 tablet      * You might also be taking other medications not listed above. If you have questions about any of your other medications, talk to the person who prescribed them or your Primary Care Provider.           Allergies:  Allergies[1]  Consultations this Admission: IP CONSULT TO STROKE NAVIGATOR/COORDINATOR  Procedures/Imaging: Procedure(s) (LRB): RIGHT CAROTID ENDARTERECTOMY (Right)    No orders to display    Pertinent Labs:  Cardiac Labs: No results for input(s): CK, CKMB, CTNI, BNP in the last 168 hours. CBC: Recent Labs    Units 10/31/23 0153 10/30/23 1048  WBC thou/mcL 10.9* 7.9  HGB gm/dL 86.0 84.8  PLT thou/mcL 294 263   BMP: Recent Labs    Units 10/31/23 0153 10/30/23 1048  NA mmol/L 134* 140  K mmol/L 4.3 4.3  CL mmol/L  100 101  CO2 mmol/L 22 27  BUN mg/dL 12 9  CREATININE mg/dL 9.14 9.39*   Lipid Panel: No results for input(s): CHOL, TRIG, HDL, LDL in the last 168 hours. Liver Enzymes: No results for input(s): INR, AST, ALT, ALKPHOS, BILITOT in the last 168 hours. Endocrine Panels: Recent Labs    Units 10/31/23 0153 10/30/23 1954 10/30/23 1806 10/30/23 1644 10/30/23 1048 10/30/23 1044  GLUCOSE mg/dL 756* 746* 819* 827* 846* 154[facility]   Physicians involved in care during this hospitalization Attending Provider: Manus GORMAN Ada, MD Admitting Provider: Manus GORMAN Ada, MD Anesthesiologist: Joseph SHAUNNA Orange, DO  HPI per admitting provider: 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  He has recovered relatively nicely from this.  He says he still has some residual right-sided weakness but on examination I thought his right sided strength was relatively good.  Carotid duplex suggests severe stenosis of his right ICA.  This was known from the previous imaging at the time of his stroke.  Now that he has recovered from his stroke and all of his other musculoskeletal issues, we discussed carotid revascularization for stroke risk reduction.  He is currently on dual antiplatelet therapy and statin therapy.  He is 63 years old and I think he is at high risk for stroke in the future given his severe right internal carotid artery stenosis in the setting of a contralateral occlusion.  He has a relatively deep common carotid artery and I think TCAR would be somewhat difficult technically.  He is a large gentleman but has good range of motion with his neck and I think a right carotid endarterectomy is a reasonable option.  We discussed risks including bleeding, infection, stroke, heart attack, cranial nerve damage, recurrence, etc.  He is willing to proceed.  We will continue his dual antiplatelet therapy and  statin therapy through surgery.  I worry he is at high risk for thrombosis with cessation of his antiplatelet therapy.  Hospital Course:    Pt admitted for an elective right carotid endarterectomy. It was performed without complication. Post operatively, the patient did well. On POD1 he was found to be able to freely void, tolerate a diet, and ambulate independently. He displayed no new signs of neuro deficit. He will be discharged home with follow up in 6 weeks including carotid duplex imaging.     BP (!) 122/47   Pulse 62   Temp 98.2 F (36.8 C) (Oral)   Resp 18   SpO2 98%   Physical Exam Gen: NAD HEENT: Right  neck with minimal swelling, c/d/i incision. Chest: RRR CTAB Abd: Soft, NT ND Ext: Bilateral equal grip strength, full ROM in all 4 ext. Neuro: A&Ox3 CN2-12 grossly intact. Post Hospital Care   Activity:   Weight Bearing Status:  Oxygen  Orders for Discharge: O2 Device: Nasal cannula SpO2: 98 % O2 Flow Rate (L/min): 2 L/min  Diet: Diet and Nourishment Orders (From admission, onward)     Start       10/30/23 1808  Consistent Carbohydrate  Diet effective now                   Wound Care Recommendations: None   Lines/Drains/Airways: Patient Lines/Drains/Airways Status     Active LDAs     Name Placement date Placement time Site Days   Peripheral IV 20 G Left;Posterior Hand 10/30/23  1054  Hand  less than 1   Peripheral IV 18 G Right Hand 10/30/23  1402  Hand  less than 1   Arterial Line 10/30/23 L Radial 10/30/23  1407  L Radial  less than 1            Therapy Recommendations:  PT:                OT:          SLP:              Home Health Orders: DME Orders (From admission, onward)    None      Home Health Agency     None       I spent 30 minutes performing discharge services.   Electronically signed: Francis JONELLE Bracket, PA 10/31/2023 / 8:10 AM       [1] Allergies Allergen Reactions  .  Oxycodone  Other    Altered mental status  *Some images could not be shown.

## 2023-10-31 NOTE — Progress Notes (Signed)
 [facility] HEALTH [facility] Case Management     Patient:   Joseph Singh MR Number:  [id number] Patient Date of Birth: November 01, 1960 Age/Sex:  63 y.o./male     Pt to be discharged home self care. No CM consult/HH/DME orders noted. Pt notified and agrees with plan. Declines need for services.  Pt's wife will provide transportation. IM from Medicare form in compliance.   Electronically signed: Nat Hines, RN 10/31/2023 / 9:18 AM

## 2023-10-31 NOTE — Nursing Note (Signed)
 Patient alert and oriented with wife at bedside. Patient and wife provided printed copy of AVS and discharge instructions were reviewed. Patient and wife educated on medications, signs and symptoms of infection, signs and symptoms of stroke, when to call the provider and when to call 911. Patient also provided information regarding follow-up appointments, after-care and activity restrictions. Patient and wife state they understand and do not have any questions at this time.   Patient picked up by member of transport team and taken to discharge lounge where wife will drive him home. Wife has all of the patients belongings.

## 2023-11-01 ENCOUNTER — Telehealth: Payer: Self-pay

## 2023-11-01 NOTE — Patient Instructions (Signed)
 Visit Information  Thank you for taking time to visit with me today. Please don't hesitate to contact me if I can be of assistance to you   Patient instructions:  Sleep with head of bed at 30 degrees x 3 days to reduce swelling  2. Remove outer dressing on post op day 2, suture strips will fall off on their own in 7-10 days. All stitches are on the inside, there is nothing to remove  3. OK to shower on post op day 2.  4. No bathtubs, hot tubs, lakes/rivers/oceans for 2 weeks  5. No driving for 1 week  6. No heavy lifting/strenuous activity x 2 weeks ( > 10lbs)  7. Call office for increased swelling, pain, fever, or drainage  Keep follow up appointments with providers  Take medications as prescribed.     Patient verbalizes understanding of instructions and care plan provided today and agrees to view in MyChart. Active MyChart status and patient understanding of how to access instructions and care plan via MyChart confirmed with patient.     The patient has been provided with contact information for the care management team and has been advised to call with any health related questions or concerns.   Please call the care guide team at [phone] if you need to cancel or reschedule your appointment.   Please call the Suicide and Crisis Lifeline: 988 call the [place]  National Suicide Prevention Lifeline: 2720822830 or TTY: 501 124 8364 TTY (713)848-8589) to talk to a trained counselor call 1-800-273-TALK (toll free, 24 hour hotline) go to [facility] [address] [place] [phone]) if you are experiencing a Mental Health or Behavioral Health Crisis or need someone to talk to.  Arvin Seip RN, BSN, CCM [employer], Population Health Case Manager Phone: [phone]

## 2023-11-01 NOTE — Transitions of Care (Post Inpatient/ED Visit) (Signed)
 11/01/2023  Name: Joseph Singh MRN: [id number] DOB: [birth year]  Today's TOC FU Call Status: Today's TOC FU Call Status:: Successful TOC FU Call Completed Unsuccessful Call (1st Attempt) Date: 11/01/23 [facility] FU Call Complete Date: 11/01/23 Patient's Name and Date of Birth confirmed.  Transition Care Management Follow-up Telephone Call Date of Discharge: 10/31/23 Discharge Facility: Other [facility]) Name of Other (Non[facility]) Discharge Facility: [facility] Type of Discharge: Inpatient Admission Primary Inpatient Discharge Diagnosis:: right carotid endarterectomy How have you been since you were released from the hospital?: Same Any questions or concerns?: No  Items Reviewed: Did you receive and understand the discharge instructions provided?: Yes Medications obtained,verified, and reconciled?: Yes (Medications Reviewed) Any new allergies since your discharge?: No Dietary orders reviewed?: Yes Type of Diet Ordered:: patient/ wife states patient is not on a specific type of a diet.   Encourage carbohydrate controlled/ modified due to patient having diabetes. Do you have support at home?: Yes People in Home [RPT]: spouse Name of Support/Comfort Primary Source: Consepcion Moats  Medications Reviewed Today: Medications Reviewed Today     Reviewed by [name], RN (Registered Nurse) on 11/01/23 at 1131  Med List Status: <None>   Medication Order Taking? Sig Documenting Provider Last Dose Status Informant  amLODipine  (NORVASC ) 10 MG tablet [id number] Yes Take 1 tablet (10 mg total) by mouth daily. Gretel App, NP  Active   aspirin  EC 81 MG tablet [id number] Yes Take 1 tablet (81 mg total) by mouth daily. Marea Selinda RAMAN, MD  Active Self, Pharmacy Records  atorvastatin  (LIPITOR) 80 MG tablet [id number] Yes Take 1 tablet (80 mg total) by mouth daily. Gretel App, NP  Active   clopidogrel  (PLAVIX ) 75 MG tablet [id number] Yes TAKE ONE TABLET BY MOUTH EVERY DAY Gretel App,  NP  Active   Continuous Glucose Sensor (FREESTYLE LIBRE 3 PLUS SENSOR) MISC [id number]  Change sensor every 15 days. Gretel App, NP  Active   gabapentin  (NEURONTIN ) 300 MG capsule [id number] Yes Take 1 capsule (300 mg total) by mouth 4 (four) times daily. Gretel App, NP  Active   glipiZIDE  (GLUCOTROL  XL) 5 MG 24 hr tablet [id number] Yes Take 1 tablet (5 mg total) by mouth daily. Gretel App, NP  Active   JARDIANCE  25 MG TABS tablet [id number] Yes TAKE 1 TABLET BY MOUTH ONCE DAILY Maribeth Camellia MATSU, MD  Active   lisinopril -hydrochlorothiazide  (ZESTORETIC ) 10-12.5 MG tablet [id number] Yes Take 1 tablet by mouth daily. Gretel App, NP  Active   metFORMIN  (GLUCOPHAGE ) 1000 MG tablet [id number] Yes TAKE ONE (1) TABLET BY MOUTH TWO TIMES PER DAY WITH A MEAL  Patient taking differently: Take 1,000 mg by mouth daily with breakfast.   Maribeth Camellia MATSU, MD  Active   metoprolol  succinate (TOPROL -XL) 50 MG 24 hr tablet [id number] Yes TAKE ONE TABLET BY MOUTH ONCE DAILY WITHOR IMMEDIATELY FOLLOWING A MEAL Gretel App, NP  Active   Multiple Vitamin (MULTIVITAMIN) tablet [id number] Yes Take 1 tablet by mouth daily. [provider]  Active   tirzepatide  (MOUNJARO ) 10 MG/0.5ML Pen [id number] Yes Inject 10 mg into the skin once a week. Dineen Rollene MATSU, FNP  Active   traMADol  (ULTRAM ) 50 MG tablet [id number] Yes TAKE 1 TABLET BY MOUTH EVERY 8 HOURS AS NEEDED FOR MODERATE PAIN Gretel App, NP  Active             Home Care and Equipment/Supplies: Were Home Health Services Ordered?: No Any new equipment or medical supplies ordered?: No  Functional Questionnaire: Do you need assistance with bathing/showering or dressing?: No Do you need assistance with meal preparation?: Yes Do you need assistance with eating?: No Do you have difficulty maintaining continence: No Do you need assistance with getting out of bed/getting out of a chair/moving?: No Do you have difficulty managing or taking your  medications?: Yes  Follow up appointments reviewed: PCP Follow-up appointment confirmed?: Yes Date of PCP follow-up appointment?: 11/07/23 Follow-up Provider: [name], NP Specialist Hospital Follow-up appointment confirmed?: Yes Date of Specialist follow-up appointment?: 12/14/23 Follow-Up Specialty Provider:: Dr. Manus Ada Do you need transportation to your follow-up appointment?: No Do you understand care options if your condition(s) worsen?: Yes-patient verbalized understanding  SDOH Interventions Today    Flowsheet Row Most Recent Value  SDOH Interventions   Food Insecurity Interventions Intervention Not Indicated  Housing Interventions Intervention Not Indicated  Transportation Interventions Intervention Not Indicated  Utilities Interventions Intervention Not Indicated   Discussed and offered 30 day TOC program.  Patient declined.  The patient has been provided with contact information for the care management team and has been advised to call with any health -related questions or concerns.  The patient verbalized understanding with current plan of care.  The patient is directed to their insurance card regarding availability of benefits coverage.    Arvin Seip RN, BSN, CCM [employer], Population Health Case Manager Phone: [phone]

## 2023-11-07 ENCOUNTER — Ambulatory Visit (INDEPENDENT_AMBULATORY_CARE_PROVIDER_SITE_OTHER): Admitting: Nurse Practitioner

## 2023-11-07 ENCOUNTER — Encounter: Payer: Self-pay | Admitting: Nurse Practitioner

## 2023-11-07 VITALS — BP 138/70 | HR 65 | Temp 97.8°F | Ht 76.0 in | Wt 287.8 lb

## 2023-11-07 DIAGNOSIS — I10 Essential (primary) hypertension: Secondary | ICD-10-CM

## 2023-11-07 DIAGNOSIS — I6521 Occlusion and stenosis of right carotid artery: Secondary | ICD-10-CM | POA: Diagnosis not present

## 2023-11-07 DIAGNOSIS — R4701 Aphasia: Secondary | ICD-10-CM

## 2023-11-07 NOTE — Progress Notes (Signed)
 Joseph Glance, NP-C Phone: [phone]  Joseph Singh is a 63 y.o. male who presents today for hospital follow up.   Discussed the use of AI scribe software for clinical note transcription with the patient, who gave verbal consent to proceed.  History of Present Illness   Joseph Singh is a 63 year old male with carotid artery disease who presents for a hospital follow-up after a right carotid endarterectomy.  He underwent a right carotid endarterectomy eight days ago following a previous stroke. Prior to the surgery, he experienced dizziness, which has not occurred since the procedure.  He is concerned about his blood pressure, which was noted to be very low during his hospital stay, with readings as low as 75/54 mmHg measured using an arterial line. Prior to surgery, his blood pressure was also low, but not as low as during the arterial measurements. He is currently on amlodipine  10 mg, lisinopril , hydrochlorothiazide , and metoprolol  for blood pressure management. He notes that his blood pressure was high during today's visit, which is unusual for him.  Since his stroke, he has experienced difficulty with word recall and articulation, which he describes as a challenge in expressing himself clearly. He has also lost 13 pounds since the stroke, which he attributes to these ongoing issues.      Social History   Tobacco Use  Smoking Status Former   Current packs/day: 0.00   Average packs/day: 0.5 packs/day for 35.0 years (17.5 ttl pk-yrs)   Types: Cigarettes   Start date: 08/09/1983   Quit date: 08/09/2018   Years since quitting: 5.2  Smokeless Tobacco Never    Current Outpatient Medications on File Prior to Visit  Medication Sig Dispense Refill   amLODipine  (NORVASC ) 10 MG tablet Take 1 tablet (10 mg total) by mouth daily. 90 tablet 3   aspirin  EC 81 MG tablet Take 1 tablet (81 mg total) by mouth daily. 150 tablet 2   atorvastatin  (LIPITOR) 80 MG tablet Take 1 tablet (80 mg total)  by mouth daily. 90 tablet 3   clopidogrel  (PLAVIX ) 75 MG tablet TAKE ONE TABLET BY MOUTH EVERY DAY 90 tablet 3   gabapentin  (NEURONTIN ) 300 MG capsule Take 1 capsule (300 mg total) by mouth 4 (four) times daily. 360 capsule 1   glipiZIDE  (GLUCOTROL  XL) 5 MG 24 hr tablet Take 1 tablet (5 mg total) by mouth daily. 90 tablet 3   JARDIANCE  25 MG TABS tablet TAKE 1 TABLET BY MOUTH ONCE DAILY 90 tablet 3   lisinopril -hydrochlorothiazide  (ZESTORETIC ) 10-12.5 MG tablet Take 1 tablet by mouth daily. 90 tablet 3   metFORMIN  (GLUCOPHAGE ) 1000 MG tablet TAKE ONE (1) TABLET BY MOUTH TWO TIMES PER DAY WITH A MEAL (Patient taking differently: Take 1,000 mg by mouth daily with breakfast.) 180 tablet 1   metoprolol  succinate (TOPROL -XL) 50 MG 24 hr tablet TAKE ONE TABLET BY MOUTH ONCE DAILY WITHOR IMMEDIATELY FOLLOWING A MEAL 90 tablet 1   Multiple Vitamin (MULTIVITAMIN) tablet Take 1 tablet by mouth daily.     tirzepatide  (MOUNJARO ) 10 MG/0.5ML Pen Inject 10 mg into the skin once a week. 6 mL 2   traMADol  (ULTRAM ) 50 MG tablet TAKE 1 TABLET BY MOUTH EVERY 8 HOURS AS NEEDED FOR MODERATE PAIN 90 tablet 2   No current facility-administered medications on file prior to visit.     ROS see history of present illness  Objective  Physical Exam Vitals:   11/07/23 1048  BP: 138/70  Pulse: 65  Temp: 97.8  F (36.6 C)  SpO2: 96%    BP Readings from Last 3 Encounters:  11/07/23 138/70  08/09/23 118/60  05/04/23 (!) 140/68   Wt Readings from Last 3 Encounters:  11/07/23 287 lb 12.8 oz (130.5 kg)  09/26/23 300 lb (136.1 kg)  04/10/23 290 lb (131.5 kg)    Physical Exam Constitutional:      General: He is not in acute distress.    Appearance: Normal appearance.  HENT:     Head: Normocephalic.  Neck:     Comments: Right side- surgical wound and bandage in place. Well healing. Tenderness present. No erythema or drainage.  Cardiovascular:     Rate and Rhythm: Normal rate and regular rhythm.     Heart  sounds: Normal heart sounds.  Pulmonary:     Effort: Pulmonary effort is normal.     Breath sounds: Normal breath sounds.  Skin:    General: Skin is warm and dry.  Neurological:     General: No focal deficit present.     Mental Status: He is alert.  Psychiatric:        Mood and Affect: Mood normal.        Behavior: Behavior normal.      Assessment/Plan: Please see individual problem list.  Stenosis of right carotid artery Assessment & Plan: Post right carotid endarterectomy with a wound that is not yet fully healed and some tenderness reported. Keep the bandage on the surgical site as it remains adhered. Follow up with vascular surgery on December 14, 2023, for imaging and evaluation.   Essential hypertension Assessment & Plan: Hypertension with orthostatic symptoms. Current blood pressure readings are high. He is on amlodipine , lisinopril , hydrochlorothiazide , and metoprolol . Monitor blood pressure at home twice daily, once in the morning after taking medications and once in the evening. Provide a blood pressure log for home monitoring. Report consistently low blood pressure readings for potential medication adjustment.    Expressive aphasia Assessment & Plan: Expressive aphasia post-stroke with difficulty in word recall and articulation. Follow up with Neurology as scheduled.       Return for as scheduled.   Joseph Glance, NP-C [facility]

## 2023-11-21 DIAGNOSIS — R4701 Aphasia: Secondary | ICD-10-CM | POA: Insufficient documentation

## 2023-11-21 NOTE — Assessment & Plan Note (Signed)
 Hypertension with orthostatic symptoms. Current blood pressure readings are high. He is on amlodipine , lisinopril , hydrochlorothiazide , and metoprolol . Monitor blood pressure at home twice daily, once in the morning after taking medications and once in the evening. Provide a blood pressure log for home monitoring. Report consistently low blood pressure readings for potential medication adjustment.

## 2023-11-21 NOTE — Assessment & Plan Note (Signed)
 Expressive aphasia post-stroke with difficulty in word recall and articulation. Follow up with Neurology as scheduled.

## 2023-11-21 NOTE — Assessment & Plan Note (Signed)
 Post right carotid endarterectomy with a wound that is not yet fully healed and some tenderness reported. Keep the bandage on the surgical site as it remains adhered. Follow up with vascular surgery on December 14, 2023, for imaging and evaluation.

## 2023-11-24 ENCOUNTER — Encounter: Payer: Self-pay | Admitting: Pharmacist

## 2023-11-24 NOTE — Progress Notes (Signed)
 Pharmacy Quality Measure Review  This patient is appearing on a report for being at risk of failing the adherence measure for cholesterol (statin) medications this calendar year.   Medication: atorvastatin  80 mg Last fill date: 08/15/23 for 90 day supply  Insurance report was not up to date. No action needed at this time.  Medication has been refilled as of 11/10/23 x90ds  2 addition refills remaining. Next refill due 02/08/24 - reminder set

## 2023-11-27 DIAGNOSIS — M14671 Charcot's joint, right ankle and foot: Secondary | ICD-10-CM | POA: Diagnosis not present

## 2023-11-27 DIAGNOSIS — M14672 Charcot's joint, left ankle and foot: Secondary | ICD-10-CM | POA: Diagnosis not present

## 2023-11-28 ENCOUNTER — Other Ambulatory Visit: Payer: Self-pay | Admitting: Nurse Practitioner

## 2023-11-28 DIAGNOSIS — M14672 Charcot's joint, left ankle and foot: Secondary | ICD-10-CM

## 2023-11-28 DIAGNOSIS — G8929 Other chronic pain: Secondary | ICD-10-CM

## 2023-12-14 DIAGNOSIS — Z48812 Encounter for surgical aftercare following surgery on the circulatory system: Secondary | ICD-10-CM | POA: Diagnosis not present

## 2023-12-14 DIAGNOSIS — I6521 Occlusion and stenosis of right carotid artery: Secondary | ICD-10-CM | POA: Diagnosis not present

## 2024-01-08 ENCOUNTER — Encounter: Payer: Self-pay | Admitting: Nurse Practitioner

## 2024-01-08 ENCOUNTER — Other Ambulatory Visit: Payer: Self-pay | Admitting: Nurse Practitioner

## 2024-01-09 NOTE — Telephone Encounter (Signed)
 open in error

## 2024-01-09 NOTE — Telephone Encounter (Signed)
 Open in error

## 2024-01-10 ENCOUNTER — Other Ambulatory Visit: Payer: Self-pay | Admitting: Nurse Practitioner

## 2024-01-10 DIAGNOSIS — G629 Polyneuropathy, unspecified: Secondary | ICD-10-CM

## 2024-01-19 ENCOUNTER — Encounter: Payer: Self-pay | Admitting: Pharmacist

## 2024-01-19 NOTE — Progress Notes (Signed)
 Pharmacy Quality Measure Review  This patient is appearing on a report for being at risk of failing the adherence measure for hypertension (ACEi/ARB) medications this calendar year.   Medication: lisinopril .hydrochlorothiazide  10/12.5 Last fill date: 10/02/23 for 90 day supply  Insurance report was not up to date. No action needed at this time.  Medication has been refilled as of 01/02/24 x90ds.   CBP: Meeting measure criteria of BP <140/90 mmHg.  BP Readings from Last 1 Encounters:  11/07/23 138/70   2025 Follow up: YES Encounter note placed: **[facility] pt - Ensure BP resting >32min. Recheck if higher than 139/79**   Future Appointments  Date Time Provider Department Center  02/08/2024  9:40 AM Lester, Kacy, NP [facility] [id number]

## 2024-02-05 ENCOUNTER — Encounter: Payer: Self-pay | Admitting: Nurse Practitioner

## 2024-02-08 ENCOUNTER — Encounter: Payer: Self-pay | Admitting: Nurse Practitioner

## 2024-02-08 ENCOUNTER — Ambulatory Visit: Admitting: Nurse Practitioner

## 2024-02-08 VITALS — BP 130/80 | HR 69 | Temp 97.8°F | Ht 76.0 in | Wt 290.2 lb

## 2024-02-08 DIAGNOSIS — E119 Type 2 diabetes mellitus without complications: Secondary | ICD-10-CM

## 2024-02-08 DIAGNOSIS — G8929 Other chronic pain: Secondary | ICD-10-CM | POA: Diagnosis not present

## 2024-02-08 DIAGNOSIS — Z7984 Long term (current) use of oral hypoglycemic drugs: Secondary | ICD-10-CM | POA: Diagnosis not present

## 2024-02-08 DIAGNOSIS — F0630 Mood disorder due to known physiological condition, unspecified: Secondary | ICD-10-CM | POA: Diagnosis not present

## 2024-02-08 DIAGNOSIS — Z7985 Long-term (current) use of injectable non-insulin antidiabetic drugs: Secondary | ICD-10-CM

## 2024-02-08 DIAGNOSIS — I10 Essential (primary) hypertension: Secondary | ICD-10-CM | POA: Diagnosis not present

## 2024-02-08 DIAGNOSIS — I69398 Other sequelae of cerebral infarction: Secondary | ICD-10-CM

## 2024-02-08 DIAGNOSIS — Z125 Encounter for screening for malignant neoplasm of prostate: Secondary | ICD-10-CM

## 2024-02-08 DIAGNOSIS — M14672 Charcot's joint, left ankle and foot: Secondary | ICD-10-CM

## 2024-02-08 DIAGNOSIS — E785 Hyperlipidemia, unspecified: Secondary | ICD-10-CM

## 2024-02-08 DIAGNOSIS — M5441 Lumbago with sciatica, right side: Secondary | ICD-10-CM | POA: Diagnosis not present

## 2024-02-08 LAB — PSA: PSA: 1.06 ng/mL (ref 0.10–4.00)

## 2024-02-08 LAB — CBC WITH DIFFERENTIAL/PLATELET
Basophils Absolute: 0 K/uL (ref 0.0–0.1)
Basophils Relative: 0.7 % (ref 0.0–3.0)
Eosinophils Absolute: 0.1 K/uL (ref 0.0–0.7)
Eosinophils Relative: 1.3 % (ref 0.0–5.0)
HCT: 45.4 % (ref 39.0–52.0)
Hemoglobin: 15.4 g/dL (ref 13.0–17.0)
Lymphocytes Relative: 22.3 % (ref 12.0–46.0)
Lymphs Abs: 1.5 K/uL (ref 0.7–4.0)
MCHC: 33.8 g/dL (ref 30.0–36.0)
MCV: 89.1 fl (ref 78.0–100.0)
Monocytes Absolute: 0.5 K/uL (ref 0.1–1.0)
Monocytes Relative: 7.3 % (ref 3.0–12.0)
Neutro Abs: 4.7 K/uL (ref 1.4–7.7)
Neutrophils Relative %: 68.4 % (ref 43.0–77.0)
Platelets: 259 K/uL (ref 150.0–400.0)
RBC: 5.1 Mil/uL (ref 4.22–5.81)
RDW: 14 % (ref 11.5–15.5)
WBC: 6.9 K/uL (ref 4.0–10.5)

## 2024-02-08 LAB — VITAMIN D 25 HYDROXY (VIT D DEFICIENCY, FRACTURES): VITD: 22.55 ng/mL — ABNORMAL LOW (ref 30.00–100.00)

## 2024-02-08 LAB — COMPREHENSIVE METABOLIC PANEL WITH GFR
ALT: 19 U/L (ref 3–53)
AST: 17 U/L (ref 5–37)
Albumin: 4 g/dL (ref 3.5–5.2)
Alkaline Phosphatase: 116 U/L (ref 39–117)
BUN: 11 mg/dL (ref 6–23)
CO2: 24 meq/L (ref 19–32)
Calcium: 9.3 mg/dL (ref 8.4–10.5)
Chloride: 99 meq/L (ref 96–112)
Creatinine, Ser: 0.78 mg/dL (ref 0.40–1.50)
GFR: 93.93 mL/min (ref >60.00)
Glucose, Bld: 162 mg/dL — ABNORMAL HIGH (ref 70–99)
Potassium: 4 meq/L (ref 3.5–5.1)
Sodium: 138 meq/L (ref 135–145)
Total Bilirubin: 0.8 mg/dL (ref 0.2–1.2)
Total Protein: 6.5 g/dL (ref 6.0–8.3)

## 2024-02-08 LAB — LIPID PANEL
Cholesterol: 109 mg/dL (ref 28–200)
HDL: 30.5 mg/dL — ABNORMAL LOW (ref >39.00)
LDL Cholesterol: 35 mg/dL (ref 10–99)
NonHDL: 78.72
Total CHOL/HDL Ratio: 4
Triglycerides: 221 mg/dL — ABNORMAL HIGH (ref 10.0–149.0)
VLDL: 44.2 mg/dL — ABNORMAL HIGH (ref 0.0–40.0)

## 2024-02-08 LAB — VITAMIN B12: Vitamin B-12: 203 pg/mL — ABNORMAL LOW (ref 211–911)

## 2024-02-08 LAB — HEMOGLOBIN A1C: Hgb A1c MFr Bld: 7.7 % — ABNORMAL HIGH (ref 4.6–6.5)

## 2024-02-08 LAB — TSH: TSH: 0.55 u[IU]/mL (ref 0.35–5.50)

## 2024-02-08 MED ORDER — TRAMADOL HCL 50 MG PO TABS: ORAL_TABLET | ORAL | 2 refills | Status: AC

## 2024-02-08 NOTE — Progress Notes (Signed)
 " Joseph Glance, NP-C Phone: [phone]  Joseph Singh is a 63 y.o. male who presents today for follow up.   Discussed the use of AI scribe software for clinical note transcription with the patient, who gave verbal consent to proceed.  History of Present Illness   Joseph Singh is a 63 year old male with a history of stroke who presents with mood changes and irritability.  He has experienced significant mood changes and increased irritability, progressively worsening over the past few years. His spouse notes a shift from his previous calm demeanor to being short-tempered and easily upset. These changes began before his stroke but have intensified since. He also has short-term memory issues.  He has a history of neuropathy, managed with gabapentin  and tramadol , primarily for hand pain. He occasionally uses Flexeril  for pain relief, which he found effective after two days of use.  He has hypertension, managed with lisinopril , hydrochlorothiazide , metoprolol , and amlodipine . He does not regularly check his blood pressure at home but reports it is usually good, with a recent reading of 130/80 mmHg. No chest pain, shortness of breath, or dizziness.  He has diabetes, managed with Mounjaro , Jardiance , glipizide , and metformin . He does not regularly check his blood sugar and denies symptoms of hypoglycemia such as lightheadedness or clamminess.  He underwent a vascular procedure and follows up regularly with vascular surgery. He previously experienced leg pain, particularly in his calves while walking, which has resolved post-procedure.  He has a history of foot surgery, which has affected his gait and contributed to back pain. He has had extensive surgery on his foot, including the removal of two toes, and has used an external fixator twice. His back pain was so severe that he could not get out of bed, according to his caregiver, but he reported significant relief after taking the prescribed  medication.      Tobacco Use History[1]  Medications Ordered Prior to Encounter[2]   ROS see history of present illness  Objective  Physical Exam Vitals:   02/08/24 0939  BP: 130/80  Pulse: 69  Temp: 97.8 F (36.6 C)  SpO2: 96%    BP Readings from Last 3 Encounters:  02/08/24 130/80  11/07/23 138/70  08/09/23 118/60   Wt Readings from Last 3 Encounters:  02/08/24 290 lb 3.2 oz (131.6 kg)  11/07/23 287 lb 12.8 oz (130.5 kg)  09/26/23 300 lb (136.1 kg)    Physical Exam Constitutional:      General: He is not in acute distress.    Appearance: Normal appearance.  HENT:     Head: Normocephalic.  Cardiovascular:     Rate and Rhythm: Normal rate and regular rhythm.     Heart sounds: Normal heart sounds.  Pulmonary:     Effort: Pulmonary effort is normal.     Breath sounds: Normal breath sounds.  Skin:    General: Skin is warm and dry.  Neurological:     General: No focal deficit present.     Mental Status: He is alert.  Psychiatric:        Mood and Affect: Mood normal.        Behavior: Behavior normal.      Assessment/Plan: Please see individual problem list.  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 Neurology follow-up is scheduled. Agreed to start Prozac 20 mg daily. Counseled patient on common side effects. Encouraged to  contact if worsening symptoms, unusual behavior changes or suicidal thoughts occur. Memory concerns will be discussed with neurology. Follow up in 4-6 weeks to assess effectiveness.   Orders: -     Vitamin B12 -     TSH -     VITAMIN D  25 Hydroxy (Vit-D Deficiency, Fractures) -     FLUoxetine HCl; Take 1 capsule (20 mg total) by mouth daily.  Dispense: 90 capsule; Refill: 0  Type 2 diabetes mellitus without complication, without long-term current use of insulin  [facility]) Assessment &  Plan: Diabetes is managed with Jardiance , Glucotrol , metformin , and Mounjaro . He reports no excessive thirst, urination, or hypoglycemic episodes. Check A1c level today, continue current diabetes medications, and discuss diet and lifestyle modifications.  Orders: -     Hemoglobin A1c  Charcot ankle, left Assessment & Plan: Previous surgery may impact gait and cause back pain due to altered bone length and density. Monitor for changes in gait and back pain. Continue tramadol  as prescribed. Follow up with Podiatry and Ortho as scheduled.    Chronic right-sided low back pain with right-sided sciatica Assessment & Plan: Chronic issue. He has been on Tramadol  50 mg every 8 hours as needed for several years. Stable on mediation. Refills sent. PDMP reviewed.    Essential hypertension Assessment & Plan: Adequately controlled with lisinopril , hydrochlorothiazide , metoprolol , and amlodipine . Stable blood pressure readings. Continue current antihypertensive regimen.   Orders: -     CBC with Differential/Platelet -     Comprehensive metabolic panel with GFR  Hyperlipidemia, unspecified hyperlipidemia type Assessment & Plan: Hyperlipidemia is managed with Lipitor. Continue current medication. Check lipid panel.   Orders: -     Lipid panel  Screening PSA (prostate specific antigen) -     PSA      Return in about 4 weeks (around 03/07/2024) for Anxiety/Depression.   Joseph Glance, NP-C [facility] [place] [facility]     [1]  Social History Tobacco Use  Smoking Status Former   Current packs/day: 0.00   Average packs/day: 0.5 packs/day for 35.0 years (17.5 ttl pk-yrs)   Types: Cigarettes   Start date: 08/09/1983   Quit date: 08/09/2018   Years since quitting: 5.5  Smokeless Tobacco Never  [2]  Current Outpatient Medications on File Prior to Visit  Medication Sig Dispense Refill   amLODipine  (NORVASC ) 10 MG tablet Take 1 tablet (10 mg total) by mouth daily. 90 tablet 3    aspirin  EC 81 MG tablet Take 1 tablet (81 mg total) by mouth daily. 150 tablet 2   atorvastatin  (LIPITOR) 80 MG tablet Take 1 tablet (80 mg total) by mouth daily. 90 tablet 3   clopidogrel  (PLAVIX ) 75 MG tablet TAKE ONE TABLET BY MOUTH EVERY DAY 90 tablet 3   cyclobenzaprine  (FLEXERIL ) 5 MG tablet TAKE 1-2 TABLETS BY MOUTH 3 TIMES DAILY AS NEEDED FOR MUSCLE SPASMS. 30 tablet 2   gabapentin  (NEURONTIN ) 300 MG capsule TAKE 1 CAPSULE BY MOUTH 4 TIMES DAILY. 360 capsule 2   glipiZIDE  (GLUCOTROL  XL) 5 MG 24 hr tablet Take 1 tablet (5 mg total) by mouth daily. 90 tablet 3   lisinopril -hydrochlorothiazide  (ZESTORETIC ) 10-12.5 MG tablet Take 1 tablet by mouth daily. 90 tablet 3   metFORMIN  (GLUCOPHAGE ) 1000 MG tablet TAKE ONE (1) TABLET BY MOUTH TWO TIMES PER DAY WITH A MEAL (Patient taking differently: Take 1,000 mg by mouth daily with breakfast.) 180 tablet 1   metoprolol  succinate (TOPROL -XL) 50 MG 24 hr tablet TAKE ONE TABLET BY  MOUTH ONCE DAILY WITHOR IMMEDIATELY FOLLOWING A MEAL 90 tablet 1   Multiple Vitamin (MULTIVITAMIN) tablet Take 1 tablet by mouth daily.     No current facility-administered medications on file prior to visit.   "

## 2024-02-09 MED ORDER — EMPAGLIFLOZIN 25 MG PO TABS: 25.0000 mg | ORAL_TABLET | Freq: Every day | ORAL | 3 refills | Status: AC

## 2024-02-09 NOTE — Telephone Encounter (Signed)
 Sent!

## 2024-02-14 ENCOUNTER — Ambulatory Visit: Payer: Self-pay | Admitting: Nurse Practitioner

## 2024-02-14 DIAGNOSIS — E119 Type 2 diabetes mellitus without complications: Secondary | ICD-10-CM

## 2024-02-14 MED ORDER — TIRZEPATIDE 12.5 MG/0.5ML ~~LOC~~ SOAJ: 12.5000 mg | SUBCUTANEOUS | 5 refills | Status: AC

## 2024-02-19 ENCOUNTER — Encounter: Payer: Self-pay | Admitting: Nurse Practitioner

## 2024-02-19 NOTE — Assessment & Plan Note (Signed)
 Chronic issue. He has been on Tramadol 50 mg every 8 hours as needed for several years. Stable on mediation. Refills sent. PDMP reviewed.

## 2024-02-19 NOTE — Assessment & Plan Note (Signed)
 Diabetes is managed with Jardiance , Glucotrol , metformin , and Mounjaro . He reports no excessive thirst, urination, or hypoglycemic episodes. Check A1c level today, continue current diabetes medications, and discuss diet and lifestyle modifications.

## 2024-02-19 NOTE — Assessment & Plan Note (Signed)
 Mood changes may be linked to cerebrovascular disease, with differential diagnosis including stroke-related versus age-related changes. Neurology follow-up is scheduled. Agreed to start Prozac 20 mg daily. Counseled patient on common side effects. Encouraged to contact if worsening symptoms, unusual behavior changes or suicidal thoughts occur. Memory concerns will be discussed with neurology. Follow up in 4-6 weeks to assess effectiveness.

## 2024-02-19 NOTE — Assessment & Plan Note (Signed)
 Adequately controlled with lisinopril , hydrochlorothiazide , metoprolol , and amlodipine . Stable blood pressure readings. Continue current antihypertensive regimen.

## 2024-02-19 NOTE — Assessment & Plan Note (Signed)
 Hyperlipidemia is managed with Lipitor. Continue current medication. Check lipid panel.

## 2024-02-19 NOTE — Assessment & Plan Note (Signed)
 Previous surgery may impact gait and cause back pain due to altered bone length and density. Monitor for changes in gait and back pain. Continue tramadol  as prescribed. Follow up with Podiatry and Ortho as scheduled.

## 2024-03-14 ENCOUNTER — Encounter: Payer: Self-pay | Admitting: Nurse Practitioner

## 2024-03-14 DIAGNOSIS — M14672 Charcot's joint, left ankle and foot: Secondary | ICD-10-CM

## 2024-03-14 DIAGNOSIS — G8929 Other chronic pain: Secondary | ICD-10-CM

## 2024-03-17 ENCOUNTER — Other Ambulatory Visit: Payer: Self-pay | Admitting: Nurse Practitioner

## 2024-03-17 DIAGNOSIS — I10 Essential (primary) hypertension: Secondary | ICD-10-CM

## 2024-03-27 ENCOUNTER — Ambulatory Visit: Admitting: Nurse Practitioner

## 2024-03-27 ENCOUNTER — Encounter: Payer: Self-pay | Admitting: Nurse Practitioner

## 2024-04-12 ENCOUNTER — Ambulatory Visit: Admitting: Nurse Practitioner

## 2024-05-16 ENCOUNTER — Ambulatory Visit: Admitting: Nurse Practitioner

## 2024-09-30 ENCOUNTER — Ambulatory Visit
